# Patient Record
Sex: Male | Born: 1955 | Race: White | Hispanic: No | Marital: Married | Smoking: Never smoker
Health system: Southern US, Community
[De-identification: ages and names within clinical notes are randomized; demographics above are authoritative.]

## PROBLEM LIST (undated history)

## (undated) DIAGNOSIS — C801 Malignant (primary) neoplasm, unspecified: Secondary | ICD-10-CM

## (undated) DIAGNOSIS — F32A Depression, unspecified: Secondary | ICD-10-CM

## (undated) DIAGNOSIS — C61 Malignant neoplasm of prostate: Secondary | ICD-10-CM

## (undated) DIAGNOSIS — K219 Gastro-esophageal reflux disease without esophagitis: Secondary | ICD-10-CM

## (undated) DIAGNOSIS — Z889 Allergy status to unspecified drugs, medicaments and biological substances status: Secondary | ICD-10-CM

## (undated) DIAGNOSIS — F419 Anxiety disorder, unspecified: Secondary | ICD-10-CM

## (undated) DIAGNOSIS — M1990 Unspecified osteoarthritis, unspecified site: Secondary | ICD-10-CM

## (undated) DIAGNOSIS — Z87442 Personal history of urinary calculi: Secondary | ICD-10-CM

## (undated) HISTORY — PX: TONSILLECTOMY: SUR1361

## (undated) HISTORY — PX: COLONOSCOPY: SHX174

## (undated) HISTORY — PX: HERNIA REPAIR: SHX51

## (undated) HISTORY — PX: PROSTATE BIOPSY: SHX241

## (undated) HISTORY — DX: Unspecified osteoarthritis, unspecified site: M19.90

## (undated) HISTORY — PX: VARICOCELE EXCISION: SUR582

## (undated) HISTORY — DX: Malignant neoplasm of prostate: C61

---

## 2004-06-01 ENCOUNTER — Ambulatory Visit: Payer: Self-pay | Admitting: Internal Medicine

## 2004-06-15 ENCOUNTER — Ambulatory Visit: Payer: Self-pay | Admitting: Internal Medicine

## 2013-07-30 ENCOUNTER — Other Ambulatory Visit: Payer: Self-pay | Admitting: Urology

## 2013-09-11 ENCOUNTER — Encounter (HOSPITAL_COMMUNITY): Payer: Self-pay | Admitting: Pharmacy Technician

## 2013-09-12 ENCOUNTER — Encounter (HOSPITAL_COMMUNITY): Payer: Self-pay

## 2013-09-12 ENCOUNTER — Encounter (HOSPITAL_COMMUNITY)
Admission: RE | Admit: 2013-09-12 | Discharge: 2013-09-12 | Disposition: A | Discharge status: Home / Self Care | Payer: BC Managed Care – PPO | Source: Ambulatory Visit | Attending: Urology | Admitting: Urology

## 2013-09-12 DIAGNOSIS — Z01812 Encounter for preprocedural laboratory examination: Secondary | ICD-10-CM | POA: Insufficient documentation

## 2013-09-12 HISTORY — DX: Malignant (primary) neoplasm, unspecified: C80.1

## 2013-09-12 HISTORY — DX: Personal history of urinary calculi: Z87.442

## 2013-09-12 HISTORY — DX: Gastro-esophageal reflux disease without esophagitis: K21.9

## 2013-09-12 HISTORY — DX: Allergy status to unspecified drugs, medicaments and biological substances: Z88.9

## 2013-09-12 LAB — CBC
HCT: 44.3 % (ref 39.0–52.0)
Hemoglobin: 15.6 g/dL (ref 13.0–17.0)
MCH: 31.8 pg (ref 26.0–34.0)
MCHC: 35.2 g/dL (ref 30.0–36.0)
MCV: 90.2 fL (ref 78.0–100.0)
Platelets: 251 10*3/uL (ref 150–400)
RBC: 4.91 MIL/uL (ref 4.22–5.81)
RDW: 12.1 % (ref 11.5–15.5)
WBC: 5.6 10*3/uL (ref 4.0–10.5)

## 2013-09-12 LAB — BASIC METABOLIC PANEL
BUN: 16 mg/dL (ref 6–23)
CO2: 25 mEq/L (ref 19–32)
Calcium: 9.1 mg/dL (ref 8.4–10.5)
Chloride: 103 mEq/L (ref 96–112)
Creatinine, Ser: 1.02 mg/dL (ref 0.50–1.35)
GFR calc Af Amer: >90 mL/min (ref >90)
GFR calc non Af Amer: 79 mL/min — ABNORMAL LOW (ref >90)
Glucose, Bld: 124 mg/dL — ABNORMAL HIGH (ref 70–99)
Potassium: 4.4 mEq/L (ref 3.7–5.3)
Sodium: 140 mEq/L (ref 137–147)

## 2013-09-12 NOTE — Patient Instructions (Addendum)
[place]  09/12/2013   Your procedure is scheduled on:  3-19 -2015  Report to [facility] at     [place]   AM.  Call this number if you have problems the morning of surgery: [phone]  Or Presurgical Testing [phone](Mandie Crabbe) For Living Will and/or Health Care Power Attorney Forms: please provide copy for your medical record,may bring AM of surgery(Forms should be already notarized -we do not provide this service).(09-12-13 Information given to patient as requested).  Remember: Follow any bowel prep instructions per MD office.   Do not eat food:After Midnight.    Take these medicines the morning of surgery with A SIP OF WATER: none   Do not wear jewelry, make-up or nail polish.  Do not wear lotions, powders, or perfumes. You may wear deodorant.  Do not shave 48 hours(2 days) prior to first CHG shower(legs and under arms).(Shaving face and neck okay.)  Do not bring valuables to the hospital.(Hospital is not responsible for lost valuables).  Contacts, dentures or removable bridgework, body piercing, hair pins may not be worn into surgery.  Leave suitcase in the car. After surgery it may be brought to your room.  For patients admitted to the hospital, checkout time is 11:00 AM the day of discharge.(Restricted visitors-Any Persons displaying flu-like symptoms or illness).    Patients discharged the day of surgery will not be allowed to drive home. Must have responsible person with you x 24 hours once discharged.  Name and phone number of your driver: Jawara Latorre -spouse [phone] cell  Special Instructions: CHG(Chlorhedine 4%-"Hibiclens","Betasept","Aplicare") Shower Use Special Wash: see special instructions.(avoid face and genitals)   Please read over the following fact sheets that you were given: Blood Transfusion fact sheet, Incentive Spirometry Instruction.  Remember : Type/Screen "Blue armbands" - may not be removed once applied(would result in being  retested AM of surgery, if removed).  Failure to follow these instructions may result in Cancellation of your surgery.   Patient signature_______________________________________________________

## 2013-09-13 LAB — ABO/RH: ABO/RH(D): O POS

## 2013-09-19 ENCOUNTER — Inpatient Hospital Stay (HOSPITAL_COMMUNITY)
Admission: RE | Admit: 2013-09-19 | Discharge: 2013-09-20 | DRG: 708 | Disposition: A | Discharge status: Home / Self Care | Payer: BC Managed Care – PPO | Source: Ambulatory Visit | Attending: Urology | Admitting: Urology

## 2013-09-19 ENCOUNTER — Encounter (HOSPITAL_COMMUNITY): Payer: BC Managed Care – PPO | Admitting: Anesthesiology

## 2013-09-19 ENCOUNTER — Inpatient Hospital Stay (HOSPITAL_COMMUNITY): Payer: BC Managed Care – PPO | Admitting: Anesthesiology

## 2013-09-19 ENCOUNTER — Encounter (HOSPITAL_COMMUNITY)
Admission: RE | Disposition: A | Discharge status: Home / Self Care | Payer: Self-pay | Source: Ambulatory Visit | Attending: Urology

## 2013-09-19 ENCOUNTER — Encounter (HOSPITAL_COMMUNITY): Payer: Self-pay | Admitting: *Deleted

## 2013-09-19 DIAGNOSIS — Z823 Family history of stroke: Secondary | ICD-10-CM

## 2013-09-19 DIAGNOSIS — C61 Malignant neoplasm of prostate: Principal | ICD-10-CM | POA: Diagnosis present

## 2013-09-19 DIAGNOSIS — Z8042 Family history of malignant neoplasm of prostate: Secondary | ICD-10-CM

## 2013-09-19 DIAGNOSIS — Z79899 Other long term (current) drug therapy: Secondary | ICD-10-CM

## 2013-09-19 HISTORY — PX: LYMPHADENECTOMY: SHX5960

## 2013-09-19 HISTORY — PX: ROBOT ASSISTED LAPAROSCOPIC RADICAL PROSTATECTOMY: SHX5141

## 2013-09-19 LAB — TYPE AND SCREEN
ABO/RH(D): O POS
Antibody Screen: NEGATIVE

## 2013-09-19 LAB — HEMOGLOBIN AND HEMATOCRIT, BLOOD
HCT: 40 % (ref 39.0–52.0)
Hemoglobin: 13.8 g/dL (ref 13.0–17.0)

## 2013-09-19 SURGERY — ROBOTIC ASSISTED LAPAROSCOPIC RADICAL PROSTATECTOMY
Anesthesia: General | Site: Abdomen

## 2013-09-19 MED ORDER — BELLADONNA ALKALOIDS-OPIUM 16.2-60 MG RE SUPP
RECTAL | Status: AC
  Filled 2013-09-19: qty 1

## 2013-09-19 MED ORDER — SODIUM CHLORIDE 0.9 % IV BOLUS (SEPSIS)
1000.0000 mL | Freq: Once | INTRAVENOUS | Status: AC
  Administered 2013-09-19: 1000 mL via INTRAVENOUS

## 2013-09-19 MED ORDER — MIDAZOLAM HCL 5 MG/5ML IJ SOLN
INTRAMUSCULAR | Status: DC | PRN
  Administered 2013-09-19: 2 mg via INTRAVENOUS

## 2013-09-19 MED ORDER — LACTATED RINGERS IV SOLN: INTRAVENOUS | Status: DC

## 2013-09-19 MED ORDER — HEMOSTATIC AGENTS (NO CHARGE) OPTIME
TOPICAL | Status: DC | PRN
  Administered 2013-09-19: 1 via TOPICAL

## 2013-09-19 MED ORDER — GLYCOPYRROLATE 0.2 MG/ML IJ SOLN
INTRAMUSCULAR | Status: AC
  Filled 2013-09-19: qty 3

## 2013-09-19 MED ORDER — PROPOFOL 10 MG/ML IV BOLUS
INTRAVENOUS | Status: AC
  Filled 2013-09-19: qty 20

## 2013-09-19 MED ORDER — PROMETHAZINE HCL 25 MG/ML IJ SOLN: 6.2500 mg | INTRAMUSCULAR | Status: DC | PRN

## 2013-09-19 MED ORDER — BUPIVACAINE-EPINEPHRINE 0.25% -1:200000 IJ SOLN
INTRAMUSCULAR | Status: DC | PRN
  Administered 2013-09-19: 30 mL

## 2013-09-19 MED ORDER — LACTATED RINGERS IV SOLN
INTRAVENOUS | Status: DC | PRN
  Administered 2013-09-19: 09:00:00

## 2013-09-19 MED ORDER — SODIUM CHLORIDE 0.9 % IJ SOLN
INTRAMUSCULAR | Status: AC
  Filled 2013-09-19: qty 10

## 2013-09-19 MED ORDER — STERILE WATER FOR IRRIGATION IR SOLN
Status: DC | PRN
  Administered 2013-09-19: 3000 mL

## 2013-09-19 MED ORDER — CIPROFLOXACIN HCL 500 MG PO TABS: 500.0000 mg | ORAL_TABLET | Freq: Two times a day (BID) | ORAL | Status: DC

## 2013-09-19 MED ORDER — CEFAZOLIN SODIUM-DEXTROSE 2-3 GM-% IV SOLR
INTRAVENOUS | Status: AC
  Filled 2013-09-19: qty 50

## 2013-09-19 MED ORDER — GLYCOPYRROLATE 0.2 MG/ML IJ SOLN
INTRAMUSCULAR | Status: DC | PRN
  Administered 2013-09-19: 0.6 mg via INTRAVENOUS

## 2013-09-19 MED ORDER — NEOSTIGMINE METHYLSULFATE 1 MG/ML IJ SOLN
INTRAMUSCULAR | Status: AC
  Filled 2013-09-19: qty 10

## 2013-09-19 MED ORDER — FENTANYL CITRATE 0.05 MG/ML IJ SOLN
INTRAMUSCULAR | Status: DC | PRN
  Administered 2013-09-19: 25 ug via INTRAVENOUS
  Administered 2013-09-19: 50 ug via INTRAVENOUS
  Administered 2013-09-19 (×7): 25 ug via INTRAVENOUS
  Administered 2013-09-19 (×2): 50 ug via INTRAVENOUS

## 2013-09-19 MED ORDER — EPHEDRINE SULFATE 50 MG/ML IJ SOLN
INTRAMUSCULAR | Status: AC
  Filled 2013-09-19: qty 1

## 2013-09-19 MED ORDER — MIDAZOLAM HCL 2 MG/2ML IJ SOLN
INTRAMUSCULAR | Status: AC
  Filled 2013-09-19: qty 2

## 2013-09-19 MED ORDER — HYDROCODONE-ACETAMINOPHEN 5-325 MG PO TABS
1.0000 | ORAL_TABLET | ORAL | Status: DC | PRN
  Administered 2013-09-20: 2 via ORAL
  Filled 2013-09-19: qty 2

## 2013-09-19 MED ORDER — ONDANSETRON HCL 4 MG/2ML IJ SOLN
4.0000 mg | INTRAMUSCULAR | Status: DC | PRN
Start: 2013-09-19 — End: 2013-09-20
  Administered 2013-09-19: 4 mg via INTRAVENOUS
  Filled 2013-09-19: qty 2

## 2013-09-19 MED ORDER — KETOROLAC TROMETHAMINE 15 MG/ML IJ SOLN
30.0000 mg | Freq: Four times a day (QID) | INTRAMUSCULAR | Status: DC
  Administered 2013-09-19 – 2013-09-20 (×3): 30 mg via INTRAVENOUS
  Filled 2013-09-19 (×2): qty 2

## 2013-09-19 MED ORDER — BUPIVACAINE-EPINEPHRINE 0.25% -1:200000 IJ SOLN
INTRAMUSCULAR | Status: AC
  Filled 2013-09-19: qty 1

## 2013-09-19 MED ORDER — SODIUM CHLORIDE 0.9 % IR SOLN
Status: DC | PRN
  Administered 2013-09-19: 1000 mL

## 2013-09-19 MED ORDER — PHENYLEPHRINE HCL 10 MG/ML IJ SOLN
INTRAMUSCULAR | Status: DC | PRN
  Administered 2013-09-19 (×2): 40 ug via INTRAVENOUS

## 2013-09-19 MED ORDER — ROCURONIUM BROMIDE 100 MG/10ML IV SOLN
INTRAVENOUS | Status: DC | PRN
  Administered 2013-09-19: 10 mg via INTRAVENOUS
  Administered 2013-09-19: 5 mg via INTRAVENOUS
  Administered 2013-09-19: 20 mg via INTRAVENOUS
  Administered 2013-09-19: 50 mg via INTRAVENOUS
  Administered 2013-09-19: 10 mg via INTRAVENOUS

## 2013-09-19 MED ORDER — ACETAMINOPHEN 325 MG PO TABS
650.0000 mg | ORAL_TABLET | ORAL | Status: DC | PRN
  Administered 2013-09-20: 650 mg via ORAL
  Filled 2013-09-19: qty 2

## 2013-09-19 MED ORDER — MORPHINE SULFATE 2 MG/ML IJ SOLN
2.0000 mg | INTRAMUSCULAR | Status: DC | PRN
  Administered 2013-09-19: 2 mg via INTRAVENOUS
  Filled 2013-09-19: qty 1

## 2013-09-19 MED ORDER — NEOSTIGMINE METHYLSULFATE 1 MG/ML IJ SOLN
INTRAMUSCULAR | Status: DC | PRN
  Administered 2013-09-19: 4 mg via INTRAVENOUS

## 2013-09-19 MED ORDER — HYDROMORPHONE HCL PF 1 MG/ML IJ SOLN
INTRAMUSCULAR | Status: AC
  Filled 2013-09-19: qty 1

## 2013-09-19 MED ORDER — KETOROLAC TROMETHAMINE 30 MG/ML IJ SOLN
INTRAMUSCULAR | Status: AC
  Filled 2013-09-19: qty 1

## 2013-09-19 MED ORDER — KETOROLAC TROMETHAMINE 30 MG/ML IJ SOLN
30.0000 mg | Freq: Four times a day (QID) | INTRAMUSCULAR | Status: DC
  Administered 2013-09-19: 30 mg via INTRAVENOUS
  Filled 2013-09-19: qty 2
  Filled 2013-09-19 (×3): qty 1

## 2013-09-19 MED ORDER — HYDROMORPHONE HCL PF 1 MG/ML IJ SOLN
0.2500 mg | INTRAMUSCULAR | Status: DC | PRN
  Administered 2013-09-19 (×4): 0.5 mg via INTRAVENOUS

## 2013-09-19 MED ORDER — CEFAZOLIN SODIUM-DEXTROSE 2-3 GM-% IV SOLR
2.0000 g | INTRAVENOUS | Status: AC
  Administered 2013-09-19: 2 g via INTRAVENOUS

## 2013-09-19 MED ORDER — DEXAMETHASONE SODIUM PHOSPHATE 10 MG/ML IJ SOLN
INTRAMUSCULAR | Status: DC | PRN
  Administered 2013-09-19: 10 mg via INTRAVENOUS

## 2013-09-19 MED ORDER — FENTANYL CITRATE 0.05 MG/ML IJ SOLN
INTRAMUSCULAR | Status: AC
  Filled 2013-09-19: qty 2

## 2013-09-19 MED ORDER — PHENYLEPHRINE 40 MCG/ML (10ML) SYRINGE FOR IV PUSH (FOR BLOOD PRESSURE SUPPORT)
PREFILLED_SYRINGE | INTRAVENOUS | Status: AC
  Filled 2013-09-19: qty 10

## 2013-09-19 MED ORDER — MEPERIDINE HCL 50 MG/ML IJ SOLN: 6.2500 mg | INTRAMUSCULAR | Status: DC | PRN

## 2013-09-19 MED ORDER — LACTATED RINGERS IV SOLN
INTRAVENOUS | Status: DC | PRN
Start: 2013-09-19 — End: 2013-09-19
  Administered 2013-09-19 (×2): via INTRAVENOUS

## 2013-09-19 MED ORDER — FENTANYL CITRATE 0.05 MG/ML IJ SOLN
INTRAMUSCULAR | Status: AC
  Filled 2013-09-19: qty 5

## 2013-09-19 MED ORDER — DEXTROSE-NACL 5-0.45 % IV SOLN
INTRAVENOUS | Status: DC
  Administered 2013-09-19 – 2013-09-20 (×3): via INTRAVENOUS

## 2013-09-19 MED ORDER — BELLADONNA ALKALOIDS-OPIUM 16.2-60 MG RE SUPP
1.0000 | RECTAL | Status: AC
  Administered 2013-09-19: 1 via RECTAL

## 2013-09-19 MED ORDER — ROCURONIUM BROMIDE 100 MG/10ML IV SOLN
INTRAVENOUS | Status: AC
  Filled 2013-09-19: qty 1

## 2013-09-19 MED ORDER — PROPOFOL 10 MG/ML IV BOLUS
INTRAVENOUS | Status: DC | PRN
  Administered 2013-09-19: 200 mg via INTRAVENOUS

## 2013-09-19 MED ORDER — DEXAMETHASONE SODIUM PHOSPHATE 10 MG/ML IJ SOLN
INTRAMUSCULAR | Status: AC
  Filled 2013-09-19: qty 1

## 2013-09-19 MED ORDER — CALCIUM CARBONATE ANTACID 500 MG PO CHEW
200.0000 mg | CHEWABLE_TABLET | Freq: Four times a day (QID) | ORAL | Status: DC | PRN
  Administered 2013-09-19: 200 mg via ORAL
  Filled 2013-09-19 (×2): qty 1

## 2013-09-19 MED ORDER — HEPARIN SODIUM (PORCINE) 1000 UNIT/ML IJ SOLN
INTRAMUSCULAR | Status: AC
  Filled 2013-09-19: qty 1

## 2013-09-19 MED ORDER — ONDANSETRON HCL 4 MG/2ML IJ SOLN
INTRAMUSCULAR | Status: AC
  Filled 2013-09-19: qty 2

## 2013-09-19 MED ORDER — ONDANSETRON HCL 4 MG/2ML IJ SOLN
INTRAMUSCULAR | Status: DC | PRN
  Administered 2013-09-19: 4 mg via INTRAVENOUS

## 2013-09-19 MED ORDER — HYDROCODONE-ACETAMINOPHEN 5-325 MG PO TABS: 1.0000 | ORAL_TABLET | Freq: Four times a day (QID) | ORAL | Status: DC | PRN

## 2013-09-19 SURGICAL SUPPLY — 51 items
APL ESCP 34 STRL LF DISP (HEMOSTASIS) ×2
APPLICATOR SURGIFLO ENDO (HEMOSTASIS) ×1 IMPLANT
CABLE HIGH FREQUENCY MONO STRZ (ELECTRODE) ×3 IMPLANT
CATH FOLEY 2WAY SLVR 18FR 30CC (CATHETERS) ×3 IMPLANT
CATH ROBINSON RED A/P 16FR (CATHETERS) ×3 IMPLANT
CATH TIEMANN FOLEY 18FR 5CC (CATHETERS) ×3 IMPLANT
CHLORAPREP W/TINT 26ML (MISCELLANEOUS) ×3 IMPLANT
CLIP LIGATING HEM O LOK PURPLE (MISCELLANEOUS) ×2 IMPLANT
CLOTH BEACON ORANGE TIMEOUT ST (SAFETY) ×3 IMPLANT
COVER SURGICAL LIGHT HANDLE (MISCELLANEOUS) ×3 IMPLANT
COVER TIP SHEARS 8 DVNC (MISCELLANEOUS) ×2 IMPLANT
COVER TIP SHEARS 8MM DA VINCI (MISCELLANEOUS) ×1
CUTTER ECHEON FLEX ENDO 45 340 (ENDOMECHANICALS) ×3 IMPLANT
DECANTER SPIKE VIAL GLASS SM (MISCELLANEOUS) ×2 IMPLANT
DRAPE SURG IRRIG POUCH 19X23 (DRAPES) ×3 IMPLANT
DRSG TEGADERM 2-3/8X2-3/4 SM (GAUZE/BANDAGES/DRESSINGS) ×12 IMPLANT
DRSG TEGADERM 4X4.75 (GAUZE/BANDAGES/DRESSINGS) ×6 IMPLANT
DRSG TEGADERM 6X8 (GAUZE/BANDAGES/DRESSINGS) ×6 IMPLANT
ELECT REM PT RETURN 9FT ADLT (ELECTROSURGICAL) ×3
ELECTRODE REM PT RTRN 9FT ADLT (ELECTROSURGICAL) ×2 IMPLANT
GAUZE SPONGE 2X2 8PLY STRL LF (GAUZE/BANDAGES/DRESSINGS) ×2 IMPLANT
GLOVE BIO SURGEON STRL SZ 6.5 (GLOVE) ×3 IMPLANT
GLOVE BIOGEL M STRL SZ7.5 (GLOVE) ×6 IMPLANT
GOWN STRL REUS W/ TWL LRG LVL3 (GOWN DISPOSABLE) IMPLANT
GOWN STRL REUS W/TWL LRG LVL3 (GOWN DISPOSABLE) ×9 IMPLANT
GOWN STRL REUS W/TWL XL LVL3 (GOWN DISPOSABLE) ×6 IMPLANT
HEMOSTAT SURGICEL 4X8 (HEMOSTASIS) ×1 IMPLANT
HOLDER FOLEY CATH W/STRAP (MISCELLANEOUS) ×3 IMPLANT
IV LACTATED RINGERS 1000ML (IV SOLUTION) ×2 IMPLANT
KIT ACCESSORY DA VINCI DISP (KITS) ×1
KIT ACCESSORY DVNC DISP (KITS) ×2 IMPLANT
NDL SAFETY ECLIPSE 18X1.5 (NEEDLE) ×2 IMPLANT
NEEDLE HYPO 18GX1.5 SHARP (NEEDLE) ×3
PACK ROBOT UROLOGY CUSTOM (CUSTOM PROCEDURE TRAY) ×3 IMPLANT
RELOAD GREEN ECHELON 45 (STAPLE) ×3 IMPLANT
SEALER TISSUE G2 CVD JAW 45CM (ENDOMECHANICALS) IMPLANT
SET TUBE IRRIG SUCTION NO TIP (IRRIGATION / IRRIGATOR) ×3 IMPLANT
SOLUTION ELECTROLUBE (MISCELLANEOUS) ×3 IMPLANT
SPONGE GAUZE 2X2 STER 10/PKG (GAUZE/BANDAGES/DRESSINGS) ×1
SPONGE GAUZE 4X4 12PLY (GAUZE/BANDAGES/DRESSINGS) ×1 IMPLANT
SURGIFLO W/THROMBIN 8M KIT (HEMOSTASIS) ×1 IMPLANT
SUT ETHILON 3 0 PS 1 (SUTURE) ×3 IMPLANT
SUT VIC AB 2-0 SH 27 (SUTURE)
SUT VIC AB 2-0 SH 27X BRD (SUTURE) ×2 IMPLANT
SUT VICRYL 0 UR6 27IN ABS (SUTURE) ×3 IMPLANT
SUT VLOC BARB 180 ABS3/0GR12 (SUTURE) ×9
SUTURE VLOC BRB 180 ABS3/0GR12 (SUTURE) ×4 IMPLANT
SYR 27GX1/2 1ML LL SAFETY (SYRINGE) ×3 IMPLANT
TOWEL OR 17X26 10 PK STRL BLUE (TOWEL DISPOSABLE) ×3 IMPLANT
TOWEL OR NON WOVEN STRL DISP B (DISPOSABLE) ×3 IMPLANT
WATER STERILE IRR 1500ML POUR (IV SOLUTION) ×4 IMPLANT

## 2013-09-19 NOTE — Transfer of Care (Signed)
Immediate Anesthesia Transfer of Care Note  Patient: Perle Gibbon  Procedure(s) Performed: Procedure(s): ROBOTIC ASSISTED LAPAROSCOPIC RADICAL PROSTATECTOMY (N/A) LYMPHADENECTOMY  PELVIC LYMPH NODE DISSECTION (Bilateral)  Patient Location: PACU  Anesthesia Type:General  Level of Consciousness: awake, alert , oriented and patient cooperative  Airway & Oxygen Therapy: Patient Spontanous Breathing and Patient connected to face mask oxygen  Post-op Assessment: Report given to PACU RN and Post -op Vital signs reviewed and stable  Post vital signs: Reviewed and stable  Complications: No apparent anesthesia complications

## 2013-09-19 NOTE — Anesthesia Preprocedure Evaluation (Signed)
Anesthesia Evaluation  Patient identified by MRN, date of birth, ID band Patient awake    Reviewed: Allergy & Precautions, H&P , NPO status , Patient's Chart, lab work & pertinent test results  Airway Mallampati: II TM Distance: >3 FB Neck ROM: Full    Dental  (+) Dental Advisory Given   Pulmonary neg pulmonary ROS,          Cardiovascular negative cardio ROS  Rhythm:Regular Rate:Normal     Neuro/Psych negative neurological ROS  negative psych ROS   GI/Hepatic Neg liver ROS, GERD-  ,  Endo/Other  negative endocrine ROS  Renal/GU negative Renal ROS     Musculoskeletal negative musculoskeletal ROS (+)   Abdominal   Peds  Hematology negative hematology ROS (+)   Anesthesia Other Findings   Reproductive/Obstetrics                           Anesthesia Physical Anesthesia Plan  ASA: II  Anesthesia Plan: General   Post-op Pain Management:    Induction: Intravenous  Airway Management Planned: Oral ETT  Additional Equipment:   Intra-op Plan:   Post-operative Plan: Extubation in OR  Informed Consent: I have reviewed the patients History and Physical, chart, labs and discussed the procedure including the risks, benefits and alternatives for the proposed anesthesia with the patient or authorized representative who has indicated his/her understanding and acceptance.   Dental advisory given  Plan Discussed with: CRNA  Anesthesia Plan Comments:         Anesthesia Quick Evaluation

## 2013-09-19 NOTE — Anesthesia Postprocedure Evaluation (Signed)
Anesthesia Post Note  Patient: Nathan Berg  Procedure(s) Performed: Procedure(s) (LRB): ROBOTIC ASSISTED LAPAROSCOPIC RADICAL PROSTATECTOMY (N/A) LYMPHADENECTOMY  PELVIC LYMPH NODE DISSECTION (Bilateral)  Anesthesia type: General  Patient location: PACU  Post pain: Pain level controlled  Post assessment: Post-op Vital signs reviewed  Last Vitals: BP 127/83  Pulse 93  Temp(Src) 36.6 C (Oral)  Resp 12  Ht 6' (1.829 m)  Wt 208 lb (94.348 kg)  BMI 28.20 kg/m2  SpO2 95%  Post vital signs: Reviewed  Level of consciousness: sedated  Complications: No apparent anesthesia complications

## 2013-09-19 NOTE — Interval H&P Note (Signed)
History and Physical Interval Note:  09/19/2013 8:12 AM  Nathan Berg  has presented today for surgery, with the diagnosis of PROSTATE CANCER  The various methods of treatment have been discussed with the patient and family. After consideration of risks, benefits and other options for treatment, the patient has consented to  Procedure(Berg): ROBOTIC ASSISTED LAPAROSCOPIC RADICAL PROSTATECTOMY (N/A) LYMPHADENECTOMY  PELVIC LYMPH NODE DISSECTION (Bilateral) as a surgical intervention .  The patient'Berg history has been reviewed, patient examined, no change in status, stable for surgery.  I have reviewed the patient'Berg chart and labs.  Questions were answered to the patient'Berg satisfaction.     Nathan Berg,Nathan Berg

## 2013-09-19 NOTE — Discharge Instructions (Signed)
1. Activity:  You are encouraged to ambulate frequently (about every hour during waking hours) to help prevent blood clots from forming in your legs or lungs.  However, you should not engage in any heavy lifting (> 10-15 lbs), strenuous activity, or straining. 2. Diet: You should continue a clear liquid diet until passing gas from below.  Once this occurs, you may advance your diet to a soft diet that would be easy to digest (i.e soups, scrambled eggs, mashed potatoes, etc.) for 24 hours just as you would if getting over a bad stomach flu.  If tolerating this diet well for 24 hours, you may then begin eating regular food.  It will be normal to have some amount of bloating, nausea, and abdominal discomfort intermittently. 3. Prescriptions:  You will be provided a prescription for pain medication to take as needed.  If your pain is not severe enough to require the prescription pain medication, you may take extra strength Tylenol instead.  You should also take an over the counter stool softener (Colace 100 mg twice daily) to avoid straining with bowel movements as the pain medication may constipate you. Finally, you will also be provided a prescription for an antibiotic to begin the day prior to your return visit in the office for catheter removal. 4. Catheter care: You will be taught how to take care of the catheter by the nursing staff prior to discharge from the hospital.  You may use both a leg bag and the larger bedside bag but it is recommended to at least use the bigger bedside bag at nighttime as the leg bag is small and will fill up overnight and also does not drain as well when lying flat. You may periodically feel a strong urge to void with the catheter in place.  This is a bladder spasm and most often can occur when having a bowel movement or when you are moving around. It is typically self-limited and usually will stop after a few minutes.  You may use some Vaseline or Neosporin around the tip of the  catheter to reduce friction at the tip of the penis. 5. Incisions: You may remove your dressing bandages the 2nd day after surgery.  You most likely will have a few small staples in each of the incisions and once the bandages are removed, the incisions may stay open to air.  You may start showering (not soaking or bathing in water) 48 hours after surgery and the incisions simply need to be patted dry after the shower.  No additional care is needed. 6. What to call us about: You should call the office [phone]) if you develop fever > 101, persistent vomiting, or the catheter stops draining. Also, feel free to call with any other questions you may have and remember the handout that was provided to you as a reference preoperatively which answers many of the common questions that arise after surgery.  You may resume aspirin, aleve, advil, vitamins, and supplements 7 days after surgery.

## 2013-09-19 NOTE — H&P (Signed)
Reason For Visit     Nathan Berg is a 58 year old male who presents today for discussion about the role of robotic prostatectomy to treat his recently diagnosed prostate cancer.  He did have palpable induration of the right lobe in his best classified as stage TIIb. The patient's father was diagnosed with prostate cancer was treated with prostatectomy. The patient discussed these issues with Dr. Karsten Ro. He presents now to discuss the pros and cons of robotic prostatectomy. He is interested in a surgical approach.  IPSS 13/2  SHIM 21/25. He has had no intra-abdominal surgery. He reports bilateral inguinal hernia repair via a incision with mesh.   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 He was found to have some slight prostatic asymmetry with the right lobe larger than the left on exam by Dr. Redmond Pulling. His father was diagnosed with prostate cancer in his mid 17s.  PSAs:  7/11 - 2.53  8/12 - 3.32  8/13 - 4.78  11/14 - 9.13  TRUS/BX 07/17/13: His prostate measured 30 cc.  Pathology: Gleason 4+3 = 7 in 3 cores, 3+4 = 7 in 2 cores and 3+3 = 6 in 1 core for a total of 6/12 cores positive all from the right hand side.     Past Medical History Problems  1. 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 History of athlete's foot (V12.09)  Surgical History Problems  1. History of Colonoscopy (Fiberoptic) 2. History of Hernia Repair 3. History of Surgery Spermatic Cord Excision Of Varicocele 4. History of Surgery Spermatic Cord For Varicocele 5. History of Tonsillectomy  Current Meds 1. Melatonin TABS;  Therapy: (Recorded:22Dec2014) to Recorded 2. Tylenol PM Extra Strength TABS;  Therapy: (Recorded:22Dec2014) to Recorded  Allergies Medication  1. Codeine Derivatives  Family History Problems  1. Family history of Environmental allergies 2. Family history of Alzheimer's disease (V17.2) : Unknown 3. Family history of malignant  neoplasm (V16.9) 4. Family history of migraine headaches (V17.2) 5. Family history of prostate cancer (V16.42) 6. Family history of stroke (V17.1) 7. No pertinent family history : Father  Social History Problems  1. Caffeine use (V49.89) 2. Married 3. Never a smoker (V49.89)  Review of Systems Genitourinary, constitutional, skin, eye, otolaryngeal, hematologic/lymphatic, cardiovascular, pulmonary, endocrine, musculoskeletal, gastrointestinal, neurological and psychiatric system(s) were reviewed and pertinent findings if present are noted.  Genitourinary: weak urinary stream.  Gastrointestinal: heartburn.  Musculoskeletal: back pain and joint pain.    Vitals Vital Signs [Data Includes: Last 1 Day]  Recorded: 26Jan2015 04:23PM  Blood Pressure: 108 / 71 Temperature: 98.4 F Heart Rate: 89  Physical Exam Constitutional: Well nourished and well developed . No acute distress.  ENT:. The ears and nose are normal in appearance.  Neck: The appearance of the neck is normal and no neck mass is present.  Pulmonary: No respiratory distress and normal respiratory rhythm and effort.  Cardiovascular: Heart rate and rhythm are normal . No peripheral edema.  Abdomen: The abdomen is soft and nontender. No masses are palpated. No CVA tenderness. No hernias are palpable. No hepatosplenomegaly noted.  Genitourinary: Examination of the penis demonstrates no discharge, no masses, no lesions and a normal meatus. The scrotum is without lesions. The right epididymis is palpably normal and non-tender. The left epididymis is palpably normal and non-tender. The right testis is non-tender and without masses. The left testis is non-tender and without masses.  Skin: Normal skin turgor, no visible rash and no visible skin lesions.  Neuro/Psych:. Mood and affect are appropriate.    Results/Data Partin table results: His probability of organ confined disease is 31% with a 62% probability of extracapsular extension  and a 15% probability of lymph node involvement.     Assessment Assessed  1. Adenocarcinoma of prostate (185)  Plan Adenocarcinoma of prostate  1. Follow-up Keep Future Appt Office  Follow-up  Status: Complete  Done: [id number] 2. PT/OT Referral Referral  Referral  Status: Hold For - PreCert,Date of Service,Physical  Therapy  Requested for: [id number]  Discussion/Summary  The patient was counseled about the natural history of prostate cancer and the standard treatment options that are available for prostate cancer. It was explained to him how his age and life expectancy, clinical stage, Gleason score, and PSA affect his prognosis, the decision to proceed with additional staging studies, as well as how that information influences recommended treatment strategies. We discussed the roles for active surveillance, radiation therapy, surgical therapy, androgen deprivation, as well as ablative therapy options for the treatment of prostate cancer as appropriate to his individual cancer situation. We discussed the risks and benefits of these options with regard to their impact on cancer control and also in terms of potential adverse events, complications, and impact on quiality of life particularly related to urinary, bowel, and sexual function. The patient was encouraged to ask questions throughout the discussion today and all questions were answered to his stated satisfaction. In addition, the patient was provided with and/or directed to appropriate resources and literature for further education about prostate cancer and treatment options.   We discussed surgical therapy for prostate cancer including the different available surgical approaches. We discussed, in detail, the risks and expectations of surgery with regard to cancer control, urinary control, and erectile function as well as the expected postoperative recovery process. The risks, potential complications/adverse events of radical prostatectomy as  well as alternative options were explained to the patient.   We discussed surgical therapy for prostate cancer including the different available surgical approaches. We discussed, in detail, the risks and expectations of surgery with regard to cancer control, urinary control, and erectile function as well as the expected postoperative recovery process. Additional risks of surgery including but not limited to bleeding, infection, hernia formation, nerve damage, lymphocele formation, bowel/rectal injury potentially necessitating colostomy, damage to the urinary tract resulting in urine leakage, urethral stricture, and the cardiopulmonary risks such as myocardial infarction, stroke, death, venothromboembolism, etc. were explained. The risk of open surgical conversion for robotic/laparoscopic prostatectomy was also discussed.   40 minutes were spent in face to face consultation with patient today.    AmendmentHe would like to proceed with surgery. Recommend left-sided nerve sparing very limited right-sided nerve spare with bilateral pelvic lymph node dissection

## 2013-09-19 NOTE — Op Note (Signed)
Preoperative diagnosis: Clinical stage T1c Adenocarcinoma prostate  Postoperative diagnosis: Same  Procedure: Robotic-assisted laparoscopic radical retropubic prostatectomy with bilateral pelvic lymph node dissection  Surgeon: Bernestine Amass, MD  Asst.: Leta Baptist, PA Anesthesia: Gen. Endotracheal  Indications: Patient was diagnosed with clinical stage TIc Adenocarcinoma the prostate. He underwent extensive consultation with regard to treatment options. The patient decided on a surgical approach. He appeared to understand the distinct advantages as well as the disadvantages of this procedure. The patient has performed a mechanical bowel prep. He has had placement of PAS compression boots and has received perioperative antibiotics. The patient's preoperative PSA was 9. Ultrasound revealed a 30 g prostate.   Technique and findings:The patient was brought to the operating room and had successful induction of general endotracheal anesthesia.the patient was placed in a low lithotomy position with careful padding of all extremities. He was secured to the operative table and placed in the steep Trendelenburg position. He was prepped and draped in usual manner. A Foley catheter was placed sterilely on the field. Camera port site was chosen 18 cm above the pubic symphysis just to the left of the umbilicus. A standard open Hassan technique was utilized. A 12 mm trocar was placed without difficulty. The camera was then inserted and no abnormalities were noted within the pelvis. The trochars were placed with direct visual guidance. This included 3 80mm robotic trochars and a 12 mm and 5 mm assist ports. Once all the ports were placed the robot was docked. The bladder was filled and the space of Retzius was developed with electrocautery dissection as well as blunt dissection. Superficial fat off the endopelvic fascia and bladder neck was removed with electrocautery scissors. The endopelvic fascia was then incised  bilaterally from base to apex. Levator musculature was swept off the apex of the prostate isolating the dorsal venous complex which was then stapled with the ETS stapling device. The anterior bladder neck was identified with the aid of the Foley balloon. This was then transected down to the Foley catheter with electrocautery scissors. The Foley catheter was then retracted anteriorly.  The posterior bladder neck was then transected and the dissection carried down to the adnexal structures. The seminal vesicles and vas deferens on both sides were then individually dissected free and retracted anteriorly. The posterior plane between the rectum and prostate was then established primarily with blunt dissection.  Attention was then turned towards nerve sparing. The patient was felt to be a candidate for left-sided nerve sparing and limited right-sided nerve sparing. Superficial fascia along the anterior lateral aspect of the prostate was incised bilaterally. This tissue was then swept laterally until we were able to establish a groove between the neurovascular tissue and the posterior lateral aspect on the prostate bilaterally. This groove was then extended from the apex back to the base of the prostate. With the prostate retracted anteriorly the vascular pedicles of the prostate were taken with the Enseal device. The Foley catheter was then reinserted and the anterior urethra was transected. The posterior urethra was then transected as were some rectourethralis fibers. The prostate was then removed from the pelvis. The pelvis was then copiously irrigated. Rectal insufflation was performed and there was no evidence of rectal injury.  Attention was then turned towards bilateral pelvic lymph node dissection. The obturator node packets were removed I laterally and the dissection extended towards the bifurcation of the iliac artery. The obturator nerve was identified on both sides and preserved. Hemalock clips were used for  small veins and lymphatic channels. The node packets were sent for permanent analysis.  Attention was then turned towards reconstruction. The bladder neck did not require any reconstruction. The bladder neck and posterior urethra were reapproximated at the 6:00 position utilizing a 3-0 V-lockl suture. The rest of the anastomosis was done with a double-armed 3-0 -lock suture in a 360 degree manner. A new catheter was placed and bladder irrigation revealed no evidence of leakage. A Blake drain was placed through one of the robotic trochars and positioned in the retropubic space above the anastomosis. This was then secured to the skin with a nylon suture. The prostate was placed in the Endopouch retrieval bag. The 12 mm trocar site was closed with a Vicryl suture with the aid of a suture passer. Our other trochars were taken out with direct visual guidance without evidence of any bleeding. The camera port incision was extended slightly to allow for removal of the specimen and then closed with a running Vicryl suture. All port sites were infiltrated with Marcaine and then closed with surgical clips. The patient was then taken to recovery room having had no obvious complications or problems. Sponge and needle counts were correct.

## 2013-09-19 NOTE — Progress Notes (Signed)
Patient ambulated around half of unit and tolerated well.  Encouraged patient to walk again tonight before bed. Patient agreed.  Will continue to monitor.

## 2013-09-19 NOTE — Progress Notes (Signed)
Patient ID: Nathan Berg, male   DOB: 08-Mar-1956, 58 y.o.   MRN: [id number] Post-op note  Subjective: The patient is doing well.  No complaints.  Denies N/V.  Has not ambulated yet.  Objective: Vital signs in last 24 hours: Temp:  [98 F (36.7 C)-98.6 F (37 C)] 98.2 F (36.8 C) (03/18 1315) Pulse Rate:  [65-82] 82 (03/18 1315) Resp:  [10-18] 12 (03/18 1315) BP: (119-140)/(66-88) 140/72 mmHg (03/18 1315) SpO2:  [95 %-100 %] 96 % (03/18 1315) Weight:  [94.348 kg (208 lb)] 94.348 kg (208 lb) (03/18 1315)  Intake/Output from previous day:   Intake/Output this shift: Total I/O In: 3000 [I.V.:2000; IV Piggyback:1000] Out: 310 [Urine:250; Drains:60]  Physical Exam:  General: Alert and oriented. Abdomen: Soft, Nondistended. Incisions: Clean and dry. Urine: dark yellow  Lab Results:  Recent Labs  09/19/13 1221  HGB 13.8  HCT 40.0    Assessment/Plan: POD#0   1) Continue to monitor  2) DVT prophy, clears, IS, amb, pain control     LOS: 0 days   YARBROUGH,Jamire Shabazz G. 09/19/2013, 3:05 PM

## 2013-09-19 NOTE — Preoperative (Signed)
Beta Blockers   Reason not to administer Beta Blockers:Not Applicable 

## 2013-09-20 ENCOUNTER — Encounter (HOSPITAL_COMMUNITY): Payer: Self-pay | Admitting: Urology

## 2013-09-20 LAB — BASIC METABOLIC PANEL
BUN: 11 mg/dL (ref 6–23)
CO2: 24 mEq/L (ref 19–32)
Calcium: 8.3 mg/dL — ABNORMAL LOW (ref 8.4–10.5)
Chloride: 102 mEq/L (ref 96–112)
Creatinine, Ser: 0.92 mg/dL (ref 0.50–1.35)
GFR calc Af Amer: >90 mL/min (ref >90)
GFR calc non Af Amer: >90 mL/min (ref >90)
Glucose, Bld: 112 mg/dL — ABNORMAL HIGH (ref 70–99)
Potassium: 4.7 mEq/L (ref 3.7–5.3)
Sodium: 136 mEq/L — ABNORMAL LOW (ref 137–147)

## 2013-09-20 LAB — HEMOGLOBIN AND HEMATOCRIT, BLOOD
HCT: 38.7 % — ABNORMAL LOW (ref 39.0–52.0)
Hemoglobin: 13 g/dL (ref 13.0–17.0)

## 2013-09-20 LAB — SURGICAL PATHOLOGY

## 2013-09-20 MED ORDER — BISACODYL 10 MG RE SUPP
10.0000 mg | Freq: Once | RECTAL | Status: AC
  Administered 2013-09-20: 10 mg via RECTAL
  Filled 2013-09-20: qty 1

## 2013-09-20 MED ORDER — CALCIUM CARBONATE ANTACID 500 MG PO CHEW: 500.0000 mg | CHEWABLE_TABLET | Freq: Four times a day (QID) | ORAL | Status: DC | PRN

## 2013-09-20 NOTE — Discharge Summary (Signed)
  Date of admission: 09/19/2013  Date of discharge: 09/20/2013  Admission diagnosis: Prostate Cancer  Discharge diagnosis: Prostate Cancer  History and Physical: For full details, please see admission history and physical. Briefly, Nathan Berg is a 58 y.o. gentleman with localized prostate cancer.  After discussing management/treatment options, he elected to proceed with surgical treatment.  Hospital Course: Nathan Berg was taken to the operating room on 09/19/2013 and underwent a robotic assisted laparoscopic radical prostatectomy. He tolerated this procedure well and without complications. Postoperatively, he was able to be transferred to a regular hospital room following recovery from anesthesia.  He was able to begin ambulating the night of surgery. He remained hemodynamically stable overnight.  He had excellent urine output with appropriately minimal output from his pelvic drain and his pelvic drain was removed on POD #1.  He was transitioned to oral pain medication, tolerated a clear liquid diet, and had met all discharge criteria and was able to be discharged home later on POD#1.  Laboratory values:  Recent Labs  09/19/13 1221 09/20/13 0433  HGB 13.8 13.0  HCT 40.0 38.7*    Disposition: Home  Discharge instruction: He was instructed to be ambulatory but to refrain from heavy lifting, strenuous activity, or driving. He was instructed on urethral catheter care.  Discharge medications:     Medication List    STOP taking these medications       diphenhydramine-acetaminophen 25-500 MG Tabs  Commonly known as:  TYLENOL PM      TAKE these medications       ciprofloxacin 500 MG tablet  Commonly known as:  CIPRO  Take 1 tablet (500 mg total) by mouth 2 (two) times daily. Start day prior to office visit for foley removal     HYDROcodone-acetaminophen 5-325 MG per tablet  Commonly known as:  NORCO  Take 1-2 tablets by mouth every 6 (six) hours as needed.     TAGAMET HB PO   Take 1 tablet by mouth 2 (two) times daily as needed (heart burn).        Followup: He will followup in 1 week for catheter removal and to discuss his surgical pathology results.

## 2013-09-20 NOTE — Progress Notes (Signed)
Patient ID: Nathan Berg, male   DOB: 06-28-1956, 58 y.o.   MRN: [id number] 1 Day Post-Op Subjective: The patient is doing well.  No nausea or vomiting. Pain is adequately controlled. No flatus.  Amb well.  Objective: Vital signs in last 24 hours: Temp:  [97.8 F (36.6 C)-98.5 F (36.9 C)] 98.3 F (36.8 C) (03/19 0622) Pulse Rate:  [65-93] 78 (03/19 0622) Resp:  [10-18] 18 (03/19 0622) BP: (113-140)/(50-85) 117/50 mmHg (03/19 0622) SpO2:  [95 %-100 %] 95 % (03/19 0622) Weight:  [94.348 kg (208 lb)] 94.348 kg (208 lb) (03/18 1315)  Intake/Output from previous day: 03/18 0701 - 03/19 0700 In: 4715.8 [P.O.:420; I.V.:3295.8; IV Piggyback:1000] Out: 2795 [Urine:2600; Drains:195] Intake/Output this shift:    Physical Exam:  General: Alert and oriented. CV: RRR Lungs: Clear bilaterally. GI: Soft, Nondistended. Incisions: Clean, dry, and intact Urine: Clear Extremities: Nontender, no erythema, no edema.  Lab Results:  Recent Labs  09/19/13 1221 09/20/13 0433  HGB 13.8 13.0  HCT 40.0 38.7*      Assessment/Plan: POD# 1 s/p robotic prostatectomy.  1) SL IVF 2) Ambulate, Incentive spirometry 3) Transition to oral pain medication 4) Dulcolax suppository 5) D/C pelvic drain 6) Plan for likely discharge later today     LOS: 1 day   YARBROUGH,Eliel Dudding G. 09/20/2013, 7:25 AM

## 2013-09-20 NOTE — Progress Notes (Signed)
Patient ambulated two laps around unit and tolerated well. Will continue to monitor.

## 2014-04-26 ENCOUNTER — Encounter: Payer: Self-pay | Admitting: Radiation Oncology

## 2014-04-26 NOTE — Progress Notes (Signed)
GU Location of Tumor / Histology: prostatic adenocarcinoma  If Prostate Cancer, Gleason Score is (4 + 3) and PSA is (9.13)  Nathan Berg had prostatectomy March of 2015. Now with biochemical recurrence  Biopsies of prostate (if applicable) revealed:    Past/Anticipated interventions by urology, if any: robotic surgery  Past/Anticipated interventions by medical oncology, if any: no  Weight changes, if any: no  Bowel/Bladder complaints, if any:   Nausea/Vomiting, if any: no  Pain issues, if any:  no  SAFETY ISSUES:  Prior radiation? no  Pacemaker/ICD? no  Possible current pregnancy? no  Is the patient on methotrexate? no  Current Complaints / other details: 58 year old male. Married. 30 cc prostate volume

## 2014-04-28 ENCOUNTER — Encounter: Payer: Self-pay | Admitting: Radiation Oncology

## 2014-04-28 NOTE — Progress Notes (Signed)
Radiation Oncology         [phone]) [phone] ________________________________  Initial outpatient Consultation  Name: Nathan Berg  MRN: [id number]  Date: 04/29/2014  DOB: 09-20-1955   CC:No primary provider on file.  Bernestine Amass, MD   REFERRING PHYSICIAN: Bernestine Amass, MD  DIAGNOSIS: 58 y.o. 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 Malignant neoplasm of prostate [place] ILLNESS::Nathan Berg is a 58 y.o. gentleman who was diagnosed with prostate cancer on 07/17/13 when his PSA was elevated at 9.13.  His TRUS with biopsy results are shown in the figure below:    He underwent Robotic-assisted laparoscopic radical retropubic prostatectomy with bilateral pelvic lymph node dissection on 09/19/13, revealing stage T2c disease with gleason 4+3 as shown in the figure below:    Post-operatively, his PSA was 0.03 on 11/13/13, then 0.06 on 02/11/14, and 0.09 on 04/11/14.  The patient reviewed the PSA results with his urologist and he has kindly been referred today for discussion of potential radiation treatment options.  PREVIOUS RADIATION THERAPY: No  PAST MEDICAL HISTORY:  has a past medical history of GERD (gastroesophageal reflux disease); H/O seasonal allergies; History of kidney stones; Cancer; Prostate cancer; and Arthritis.    PAST SURGICAL HISTORY: Past Surgical History  Procedure Laterality Date  . Hernia repair Bilateral     20 yrs ago  . Tonsillectomy      child  . Varicocele excision    . Robot assisted laparoscopic radical prostatectomy N/A 09/19/2013    Procedure: ROBOTIC ASSISTED LAPAROSCOPIC RADICAL PROSTATECTOMY;  Surgeon: Bernestine Amass, MD;  Location: [facility];  Service: Urology;  Laterality: N/A;  . Lymphadenectomy Bilateral 09/19/2013    Procedure: LYMPHADENECTOMY  PELVIC LYMPH NODE DISSECTION;   Surgeon: Bernestine Amass, MD;  Location: [facility];  Service: Urology;  Laterality: Bilateral;  . Colonoscopy    . Prostate biopsy      FAMILY HISTORY: family history includes Cancer in his father.  SOCIAL HISTORY:  reports that he has never smoked. He has never used smokeless tobacco. He reports that he drinks alcohol. He reports that he does not use illicit drugs.  ALLERGIES: Codeine  MEDICATIONS:  Current Outpatient Prescriptions  Medication Sig Dispense Refill  . Cimetidine (TAGAMET HB PO) Take 1 tablet by mouth 2 (two) times daily as needed (heart burn).      . diphenhydramine-acetaminophen (TYLENOL PM) 25-500 MG TABS Take 1 tablet by mouth at bedtime as needed.      Marland Kitchen MELATONIN PO Take by mouth.      . tadalafil (CIALIS) 5 MG tablet Take 5 mg by mouth daily as needed for erectile dysfunction.       No current facility-administered medications for this encounter.    REVIEW OF SYSTEMS:  A 15 point review of systems is documented in the electronic medical record. This was obtained by the nursing staff. However, I reviewed this with the patient to discuss relevant findings and make appropriate changes.  A comprehensive review of systems was negative..  The patient completed an IPSS and IIEF questionnaire.    He indicated that his erectile function is improving with Cialis.   PHYSICAL EXAM: This patient is in no acute distress.  He is alert and oriented.   height is 5\' 11"  (1.803 m) and weight is 210  lb 9.6 oz (95.528 kg). His blood pressure is 142/86 and his pulse is 78. His respiration is 16.  He exhibits no respiratory distress or labored breathing.  He appears neurologically intact.  His mood is pleasant.  His affect is appropriate.  Please note the digital rectal exam findings described above.  KPS = 90  100 - Normal; no complaints; no evidence of disease. 90   - Able to carry on normal activity; minor signs or symptoms of disease. 80   - Normal activity with effort; some signs or  symptoms of disease. 67   - Cares for self; unable to carry on normal activity or to do active work. 60   - Requires occasional assistance, but is able to care for most of his personal needs. 50   - Requires considerable assistance and frequent medical care. 50   - Disabled; requires special care and assistance. 31   - Severely disabled; hospital admission is indicated although death not imminent. 33   - Very sick; hospital admission necessary; active supportive treatment necessary. 10   - Moribund; fatal processes progressing rapidly. 0     - Dead  Karnofsky DA, Abelmann Thayne, Craver LS and Burchenal [facility] [phone]) The use of the nitrogen mustards in the palliative treatment of carcinoma: with particular reference to bronchogenic carcinoma Cancer 1 [phone]   LABORATORY DATA:  Lab Results  Component Value Date   WBC 5.6 09/12/2013   HGB 13.0 09/20/2013   HCT 38.7* 09/20/2013   MCV 90.2 09/12/2013   PLT 251 09/12/2013   Lab Results  Component Value Date   NA 136* 09/20/2013   K 4.7 09/20/2013   CL 102 09/20/2013   CO2 24 09/20/2013   No results found for this basename: ALT,  AST,  GGT,  ALKPHOS,  BILITOT     RADIOGRAPHY: No results found.    IMPRESSION: This is a 58 y.o. 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  He appears to be experiencing biochemical recurrence of prostate cancer.  He does not have any adverse pathology features to suggest higher risk of local recurrence.  He is eligible for a variety of potential options including further PSA monitoring or salvage prostatic fossa radiotherapy with or without hormone therapy.  PLAN:Today I reviewed the findings and workup thus far.  We discussed the natural history of prostate cancer following prostatectomy.  We reviewed the the implications of extraprostatic extension, positive margins, seminal vesicle involvement, lymph node  involvement, and postoperative PSA on decision-making and outcomes.  We discussed radiation treatment in the management of biochemically recurrent prostate cancer with regard to the logistics and delivery of external beam radiation treatment as well as the logistics and delivery of prostate brachytherapy.  The patient expressed interest in external beam radiotherapy.  I filled out a patient counseling form for him with relevant treatment diagrams and we retained a copy for our records.   The patient would like to proceed with prostatic fossa IMRT.  I will share my findings with Dr. Risa Grill and move forward with scheduling CT planning in order to proceed with IMRT in the near future.     The question about adding hormone therapy to radiation treatment has been compounded by the recent presentation of evidence supporting antiandrogen therapy with prostatic fossa radiotherapy for biochemically recurrent prostate cancer following prostatectomy. I talked to the patient today about the pros and  cons of hormone therapy as well as anticipated acute and late sequelae associated with hormone therapy. The patient would actually like to consider combining hormone therapy with radiation treatment given the evidence that it can lead to an improvement in disease-free survival as well as overall survival.  I enjoyed meeting with him today, and will look forward to participating in the care of this very nice gentleman.  I spent 60 minutes face to face with the patient and more than 50% of that time was spent in counseling and/or coordination of care.   ------------------------------------------------  Sheral Apley. Tammi Klippel, M.D.

## 2014-04-29 ENCOUNTER — Ambulatory Visit
Admission: RE | Admit: 2014-04-29 | Discharge: 2014-04-29 | Disposition: A | Discharge status: Home / Self Care | Payer: BC Managed Care – PPO | Source: Ambulatory Visit | Attending: Radiation Oncology | Admitting: Radiation Oncology

## 2014-04-29 ENCOUNTER — Encounter: Payer: Self-pay | Admitting: Radiation Oncology

## 2014-04-29 VITALS — BP 142/86 | HR 78 | Resp 16 | Ht 71.0 in | Wt 210.6 lb

## 2014-04-29 DIAGNOSIS — K219 Gastro-esophageal reflux disease without esophagitis: Secondary | ICD-10-CM | POA: Insufficient documentation

## 2014-04-29 DIAGNOSIS — C61 Malignant neoplasm of prostate: Secondary | ICD-10-CM

## 2014-04-29 DIAGNOSIS — Z9079 Acquired absence of other genital organ(s): Secondary | ICD-10-CM | POA: Diagnosis not present

## 2014-04-29 DIAGNOSIS — Z510 Encounter for antineoplastic radiation therapy: Secondary | ICD-10-CM | POA: Diagnosis present

## 2014-04-29 NOTE — Progress Notes (Signed)
See progress note under physician encounter. 

## 2014-04-30 ENCOUNTER — Ambulatory Visit
Admission: RE | Admit: 2014-04-30 | Discharge: 2014-04-30 | Disposition: A | Discharge status: Home / Self Care | Payer: BC Managed Care – PPO | Source: Ambulatory Visit | Attending: Radiation Oncology | Admitting: Radiation Oncology

## 2014-04-30 DIAGNOSIS — C61 Malignant neoplasm of prostate: Secondary | ICD-10-CM

## 2014-04-30 DIAGNOSIS — Z510 Encounter for antineoplastic radiation therapy: Secondary | ICD-10-CM | POA: Diagnosis not present

## 2014-04-30 NOTE — Progress Notes (Signed)
  Radiation Oncology         [phone] ________________________________  Name: Nathan Berg  MRN: [id number]  Date: 04/30/2014  DOB: [birth year]   SIMULATION AND TREATMENT PLANNING NOTE    ICD-9-CM ICD-10-CM  1. Malignant neoplasm of prostate 185 C61    DIAGNOSIS:  58 y.o. 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  Identity was confirmed.  All relevant records and images related to the planned course of therapy were reviewed.  The patient freely provided informed written consent to proceed with treatment after reviewing the details related to the planned course of therapy. The consent form was witnessed and verified by the simulation staff.  Then, the patient was set-up in a stable reproducible supine position for radiation therapy.  A vacuum lock pillow device was custom fabricated to position his legs in a reproducible immobilized position.  Then, I performed a urethrogram under sterile conditions to identify the prostatic apex.  CT images were obtained.  Surface markings were placed.  The CT images were loaded into the planning software.  Then the prostate target and avoidance structures including the rectum, bladder, bowel and hips were contoured.  Treatment planning then occurred.  The radiation prescription was entered and confirmed.  A total of one complex treatment device was fabricated. I have requested : Intensity Modulated Radiotherapy (IMRT) is medically necessary for this case for the following reason:  Rectal sparing.Marland Kitchen  PLAN:  The patient will receive 68.4 Gy in 38 fractions.  ________________________________  Sheral Apley Tammi Klippel, M.D.

## 2014-05-02 DIAGNOSIS — Z510 Encounter for antineoplastic radiation therapy: Secondary | ICD-10-CM | POA: Diagnosis not present

## 2014-05-06 ENCOUNTER — Ambulatory Visit
Admission: RE | Admit: 2014-05-06 | Discharge: 2014-05-06 | Disposition: A | Discharge status: Home / Self Care | Payer: BC Managed Care – PPO | Source: Ambulatory Visit | Attending: Radiation Oncology | Admitting: Radiation Oncology

## 2014-05-06 DIAGNOSIS — Z510 Encounter for antineoplastic radiation therapy: Secondary | ICD-10-CM | POA: Diagnosis not present

## 2014-05-07 ENCOUNTER — Ambulatory Visit: Admission: RE | Admit: 2014-05-07 | Payer: BC Managed Care – PPO | Source: Ambulatory Visit

## 2014-05-08 ENCOUNTER — Ambulatory Visit
Admission: RE | Admit: 2014-05-08 | Discharge: 2014-05-08 | Disposition: A | Discharge status: Home / Self Care | Payer: BC Managed Care – PPO | Source: Ambulatory Visit | Attending: Radiation Oncology | Admitting: Radiation Oncology

## 2014-05-08 DIAGNOSIS — Z510 Encounter for antineoplastic radiation therapy: Secondary | ICD-10-CM | POA: Diagnosis not present

## 2014-05-09 ENCOUNTER — Ambulatory Visit
Admission: RE | Admit: 2014-05-09 | Discharge: 2014-05-09 | Disposition: A | Discharge status: Home / Self Care | Payer: BC Managed Care – PPO | Source: Ambulatory Visit | Attending: Radiation Oncology | Admitting: Radiation Oncology

## 2014-05-09 ENCOUNTER — Encounter: Payer: Self-pay | Admitting: Radiation Oncology

## 2014-05-09 VITALS — BP 121/75 | HR 97 | Temp 99.0°F | Resp 20 | Wt 212.8 lb

## 2014-05-09 DIAGNOSIS — C61 Malignant neoplasm of prostate: Secondary | ICD-10-CM

## 2014-05-09 DIAGNOSIS — Z510 Encounter for antineoplastic radiation therapy: Secondary | ICD-10-CM | POA: Diagnosis not present

## 2014-05-09 NOTE — Progress Notes (Signed)
  Radiation Oncology         [phone] ________________________________    Name: Nathan Berg  MRN: [id number]  Date: 05/09/2014  DOB: January 12, 1956  Weekly Radiation Therapy Management    ICD-9-CM ICD-10-CM   1. Malignant neoplasm of prostate 185 C61     Current Dose: 5.4 Gy     Planned Dose:  68.4 Gy  Narrative . . . . . . . . The patient presents for routine under treatment assessment.                                   The patient is without complaint.                                 Set-up films were reviewed.                                 The chart was checked. Physical Findings. . .  weight is 212 lb 12.8 oz (96.525 kg). His oral temperature is 99 F (37.2 C). His blood pressure is 121/75 and his pulse is 97. His respiration is 20. . Weight essentially stable.  No significant changes. Impression . . . . . . . The patient is tolerating radiation. Plan . . . . . . . . . . . . Continue treatment as planned.  ________________________________  Sheral Apley. Tammi Klippel, M.D.

## 2014-05-09 NOTE — Progress Notes (Signed)
Patient education completed with patient. Gave him "Radiation and You" booklet with all pertinent information marked and discussed, re: fatigue, rectal irritation/care, urinary/bladder irritation/management, nutrition, pain. Teach back used. Pt verbalized understanding.   Patient denies urinary/bowel issues, pain, fatigue.

## 2014-05-10 ENCOUNTER — Ambulatory Visit
Admission: RE | Admit: 2014-05-10 | Discharge: 2014-05-10 | Disposition: A | Discharge status: Home / Self Care | Payer: BC Managed Care – PPO | Source: Ambulatory Visit | Attending: Radiation Oncology | Admitting: Radiation Oncology

## 2014-05-10 DIAGNOSIS — Z510 Encounter for antineoplastic radiation therapy: Secondary | ICD-10-CM | POA: Diagnosis not present

## 2014-05-13 ENCOUNTER — Ambulatory Visit
Admission: RE | Admit: 2014-05-13 | Discharge: 2014-05-13 | Disposition: A | Discharge status: Home / Self Care | Payer: BC Managed Care – PPO | Source: Ambulatory Visit | Attending: Radiation Oncology | Admitting: Radiation Oncology

## 2014-05-13 DIAGNOSIS — Z510 Encounter for antineoplastic radiation therapy: Secondary | ICD-10-CM | POA: Diagnosis not present

## 2014-05-14 ENCOUNTER — Ambulatory Visit
Admission: RE | Admit: 2014-05-14 | Discharge: 2014-05-14 | Disposition: A | Discharge status: Home / Self Care | Payer: BC Managed Care – PPO | Source: Ambulatory Visit | Attending: Radiation Oncology | Admitting: Radiation Oncology

## 2014-05-14 DIAGNOSIS — Z510 Encounter for antineoplastic radiation therapy: Secondary | ICD-10-CM | POA: Diagnosis not present

## 2014-05-15 ENCOUNTER — Ambulatory Visit
Admission: RE | Admit: 2014-05-15 | Discharge: 2014-05-15 | Disposition: A | Discharge status: Home / Self Care | Payer: BC Managed Care – PPO | Source: Ambulatory Visit | Attending: Radiation Oncology | Admitting: Radiation Oncology

## 2014-05-15 DIAGNOSIS — Z510 Encounter for antineoplastic radiation therapy: Secondary | ICD-10-CM | POA: Diagnosis not present

## 2014-05-16 ENCOUNTER — Ambulatory Visit
Admission: RE | Admit: 2014-05-16 | Discharge: 2014-05-16 | Disposition: A | Discharge status: Home / Self Care | Payer: BC Managed Care – PPO | Source: Ambulatory Visit | Attending: Radiation Oncology | Admitting: Radiation Oncology

## 2014-05-16 DIAGNOSIS — Z510 Encounter for antineoplastic radiation therapy: Secondary | ICD-10-CM | POA: Diagnosis not present

## 2014-05-17 ENCOUNTER — Ambulatory Visit
Admission: RE | Admit: 2014-05-17 | Discharge: 2014-05-17 | Disposition: A | Discharge status: Home / Self Care | Payer: BC Managed Care – PPO | Source: Ambulatory Visit | Attending: Radiation Oncology | Admitting: Radiation Oncology

## 2014-05-17 ENCOUNTER — Encounter: Payer: Self-pay | Admitting: Radiation Oncology

## 2014-05-17 VITALS — BP 140/77 | HR 84 | Resp 16 | Wt 211.9 lb

## 2014-05-17 DIAGNOSIS — Z510 Encounter for antineoplastic radiation therapy: Secondary | ICD-10-CM | POA: Diagnosis not present

## 2014-05-17 DIAGNOSIS — C61 Malignant neoplasm of prostate: Secondary | ICD-10-CM

## 2014-05-17 NOTE — Progress Notes (Signed)
Reports on average he gets up three times during the night to void. Weight and vitals stable. Describes what sounds like two hot flashes this week. Describes a strong steady urine stream when voiding. Denies difficulty emptying his bladder or urgency. Denies hematuria or dysuria. Denies fatigue or pain.

## 2014-05-18 ENCOUNTER — Encounter: Payer: Self-pay | Admitting: Radiation Oncology

## 2014-05-18 NOTE — Progress Notes (Signed)
  Radiation Oncology         [phone]) [phone] ________________________________    Name: Nathan Berg  MRN: [id number]  Date: 05/17/2014  DOB: 11-Apr-1956  Weekly Radiation Therapy Management    ICD-9-CM ICD-10-CM   1. Malignant neoplasm of prostate 185 C61     Current Dose: 16.2 Gy     Planned Dose:  68.4 Gy  Narrative . . . . . . . . The patient presents for routine under treatment assessment.                                   Reports on average he gets up three times during the night to void. Weight and vitals stable. Describes what sounds like two hot flashes this week. Describes a strong steady urine stream when voiding. Denies difficulty emptying his bladder or urgency. Denies hematuria or dysuria. Denies fatigue or pain.                                  Set-up films were reviewed.                                 The chart was checked. Physical Findings. . .  weight is 211 lb 14.4 oz (96.117 kg). His blood pressure is 140/77 and his pulse is 84. His respiration is 16. . Weight essentially stable.  No significant changes. Impression . . . . . . . The patient is tolerating radiation. Plan . . . . . . . . . . . . Continue treatment as planned.  ________________________________  Sheral Apley. Tammi Klippel, M.D.

## 2014-05-20 ENCOUNTER — Ambulatory Visit
Admission: RE | Admit: 2014-05-20 | Discharge: 2014-05-20 | Disposition: A | Discharge status: Home / Self Care | Payer: BC Managed Care – PPO | Source: Ambulatory Visit | Attending: Radiation Oncology | Admitting: Radiation Oncology

## 2014-05-20 DIAGNOSIS — Z510 Encounter for antineoplastic radiation therapy: Secondary | ICD-10-CM | POA: Diagnosis not present

## 2014-05-21 ENCOUNTER — Ambulatory Visit
Admission: RE | Admit: 2014-05-21 | Discharge: 2014-05-21 | Disposition: A | Discharge status: Home / Self Care | Payer: BC Managed Care – PPO | Source: Ambulatory Visit | Attending: Radiation Oncology | Admitting: Radiation Oncology

## 2014-05-21 DIAGNOSIS — Z510 Encounter for antineoplastic radiation therapy: Secondary | ICD-10-CM | POA: Diagnosis not present

## 2014-05-22 ENCOUNTER — Ambulatory Visit
Admission: RE | Admit: 2014-05-22 | Discharge: 2014-05-22 | Disposition: A | Discharge status: Home / Self Care | Payer: BC Managed Care – PPO | Source: Ambulatory Visit | Attending: Radiation Oncology | Admitting: Radiation Oncology

## 2014-05-22 DIAGNOSIS — Z510 Encounter for antineoplastic radiation therapy: Secondary | ICD-10-CM | POA: Diagnosis not present

## 2014-05-23 ENCOUNTER — Ambulatory Visit
Admission: RE | Admit: 2014-05-23 | Discharge: 2014-05-23 | Disposition: A | Discharge status: Home / Self Care | Payer: BC Managed Care – PPO | Source: Ambulatory Visit | Attending: Radiation Oncology | Admitting: Radiation Oncology

## 2014-05-23 DIAGNOSIS — Z510 Encounter for antineoplastic radiation therapy: Secondary | ICD-10-CM | POA: Diagnosis not present

## 2014-05-24 ENCOUNTER — Ambulatory Visit
Admission: RE | Admit: 2014-05-24 | Discharge: 2014-05-24 | Disposition: A | Discharge status: Home / Self Care | Payer: BC Managed Care – PPO | Source: Ambulatory Visit | Attending: Radiation Oncology | Admitting: Radiation Oncology

## 2014-05-24 ENCOUNTER — Encounter: Payer: Self-pay | Admitting: Radiation Oncology

## 2014-05-24 VITALS — BP 130/84 | HR 67 | Resp 16 | Wt 211.0 lb

## 2014-05-24 DIAGNOSIS — C61 Malignant neoplasm of prostate: Secondary | ICD-10-CM

## 2014-05-24 DIAGNOSIS — Z510 Encounter for antineoplastic radiation therapy: Secondary | ICD-10-CM | POA: Diagnosis not present

## 2014-05-24 NOTE — Progress Notes (Signed)
  Radiation Oncology         [phone] ________________________________    Name: Nathan Berg  MRN: [id number]  Date: 05/24/2014  DOB: 05-12-1956  Weekly Radiation Therapy Management    ICD-9-CM ICD-10-CM   1. Malignant neoplasm of prostate 185 C61     Current Dose: 25.2 Gy     Planned Dose:  68.4 Gy  Narrative . . . . . . . . The patient presents for routine under treatment assessment.                                   Reports nocturia x 5. Denies frequency of urination during the day. Denies dysuria. Denies hematuria. Denies diarrhea. Reports strong steady urine stream. Denies leakage or incontinence. Reports an occasional urgency. Denies fatigue. Denies pain. Weight and vitals stable.                                  Set-up films were reviewed.                                 The chart was checked. Physical Findings. . .  weight is 211 lb (95.709 kg). His blood pressure is 130/84 and his pulse is 67. His respiration is 16. . Weight essentially stable.  No significant changes. Impression . . . . . . . The patient is tolerating radiation. Plan . . . . . . . . . . . . Continue treatment as planned.  ________________________________  Sheral Apley. Tammi Klippel, M.D.

## 2014-05-24 NOTE — Progress Notes (Signed)
Reports nocturia x 5. Denies frequency of urination during the day. Denies dysuria. Denies hematuria. Denies diarrhea. Reports strong steady urine stream. Denies leakage or incontinence. Reports an occasional urgency. Denies fatigue. Denies pain. Weight and vitals stable.

## 2014-05-26 ENCOUNTER — Ambulatory Visit
Admission: RE | Admit: 2014-05-26 | Discharge: 2014-05-26 | Disposition: A | Discharge status: Home / Self Care | Payer: BC Managed Care – PPO | Source: Ambulatory Visit | Attending: Radiation Oncology | Admitting: Radiation Oncology

## 2014-05-26 DIAGNOSIS — Z510 Encounter for antineoplastic radiation therapy: Secondary | ICD-10-CM | POA: Diagnosis not present

## 2014-05-27 ENCOUNTER — Ambulatory Visit
Admission: RE | Admit: 2014-05-27 | Discharge: 2014-05-27 | Disposition: A | Discharge status: Home / Self Care | Payer: BC Managed Care – PPO | Source: Ambulatory Visit | Attending: Radiation Oncology | Admitting: Radiation Oncology

## 2014-05-27 DIAGNOSIS — Z510 Encounter for antineoplastic radiation therapy: Secondary | ICD-10-CM | POA: Diagnosis not present

## 2014-05-28 ENCOUNTER — Ambulatory Visit
Admission: RE | Admit: 2014-05-28 | Discharge: 2014-05-28 | Disposition: A | Discharge status: Home / Self Care | Payer: BC Managed Care – PPO | Source: Ambulatory Visit | Attending: Radiation Oncology | Admitting: Radiation Oncology

## 2014-05-28 DIAGNOSIS — Z510 Encounter for antineoplastic radiation therapy: Secondary | ICD-10-CM | POA: Diagnosis not present

## 2014-05-28 NOTE — Progress Notes (Signed)
  Radiation Oncology         [phone] ________________________________    Name: Nathan Berg  MRN: [id number]  Date: 05/29/2014  DOB: 03-07-1956  Weekly Radiation Therapy Management    ICD-9-CM ICD-10-CM   1. Malignant neoplasm of prostate 185 C61     Current Dose: 34.2 Gy     Planned Dose:  68.4 Gy  Narrative . . . . . . . . The patient presents for routine under treatment assessment.                                   Reports scant rectal bleeding yesterday. Understands to use baby wipe and tucks for relief of rectal irritation. Denies diarrhea. Reports difficulty sleeping despite taking melatonin and tylenol pm. Reports nocturia every 1.5. Reports a strong steady urine stream. Denies incontinence or leakage. Denies dysuria or hematuria                                  Set-up films were reviewed.                                 The chart was checked. Physical Findings. . .  weight is 212 lb 3.2 oz (96.253 kg). His blood pressure is 138/96 and his pulse is 74. His respiration is 16. . Weight essentially stable.  No significant changes. Impression . . . . . . . The patient is tolerating radiation. Plan . . . . . . . . . . . . Continue treatment as planned.  ________________________________  Sheral Apley. Tammi Klippel, M.D.

## 2014-05-29 ENCOUNTER — Ambulatory Visit
Admission: RE | Admit: 2014-05-29 | Discharge: 2014-05-29 | Disposition: A | Discharge status: Home / Self Care | Payer: BC Managed Care – PPO | Source: Ambulatory Visit | Attending: Radiation Oncology | Admitting: Radiation Oncology

## 2014-05-29 ENCOUNTER — Encounter: Payer: Self-pay | Admitting: Radiation Oncology

## 2014-05-29 VITALS — BP 138/96 | HR 74 | Resp 16 | Wt 212.2 lb

## 2014-05-29 DIAGNOSIS — C61 Malignant neoplasm of prostate: Secondary | ICD-10-CM

## 2014-05-29 DIAGNOSIS — Z510 Encounter for antineoplastic radiation therapy: Secondary | ICD-10-CM | POA: Diagnosis not present

## 2014-05-29 NOTE — Progress Notes (Signed)
Reports scant rectal bleeding yesterday. Understands to use baby wipe and tucks for relief of rectal irritation. Denies diarrhea. Reports difficulty sleeping despite taking melatonin and tylenol pm. Reports nocturia every 1.5. Reports a strong steady urine stream. Denies incontinence or leakage. Denies dysuria or hematuria.

## 2014-05-31 ENCOUNTER — Ambulatory Visit: Payer: BC Managed Care – PPO

## 2014-06-03 ENCOUNTER — Ambulatory Visit
Admission: RE | Admit: 2014-06-03 | Discharge: 2014-06-03 | Disposition: A | Discharge status: Home / Self Care | Payer: BC Managed Care – PPO | Source: Ambulatory Visit | Attending: Radiation Oncology | Admitting: Radiation Oncology

## 2014-06-03 DIAGNOSIS — Z510 Encounter for antineoplastic radiation therapy: Secondary | ICD-10-CM | POA: Diagnosis not present

## 2014-06-04 ENCOUNTER — Ambulatory Visit
Admission: RE | Admit: 2014-06-04 | Discharge: 2014-06-04 | Disposition: A | Discharge status: Home / Self Care | Payer: BC Managed Care – PPO | Source: Ambulatory Visit | Attending: Radiation Oncology | Admitting: Radiation Oncology

## 2014-06-04 DIAGNOSIS — Z510 Encounter for antineoplastic radiation therapy: Secondary | ICD-10-CM | POA: Diagnosis not present

## 2014-06-05 ENCOUNTER — Ambulatory Visit
Admission: RE | Admit: 2014-06-05 | Discharge: 2014-06-05 | Disposition: A | Discharge status: Home / Self Care | Payer: BC Managed Care – PPO | Source: Ambulatory Visit | Attending: Radiation Oncology | Admitting: Radiation Oncology

## 2014-06-05 DIAGNOSIS — Z510 Encounter for antineoplastic radiation therapy: Secondary | ICD-10-CM | POA: Diagnosis not present

## 2014-06-06 ENCOUNTER — Ambulatory Visit
Admission: RE | Admit: 2014-06-06 | Discharge: 2014-06-06 | Disposition: A | Discharge status: Home / Self Care | Payer: BC Managed Care – PPO | Source: Ambulatory Visit | Attending: Radiation Oncology | Admitting: Radiation Oncology

## 2014-06-06 DIAGNOSIS — Z510 Encounter for antineoplastic radiation therapy: Secondary | ICD-10-CM | POA: Diagnosis not present

## 2014-06-07 ENCOUNTER — Ambulatory Visit: Payer: BC Managed Care – PPO

## 2014-06-10 ENCOUNTER — Ambulatory Visit
Admission: RE | Admit: 2014-06-10 | Discharge: 2014-06-10 | Disposition: A | Discharge status: Home / Self Care | Payer: BC Managed Care – PPO | Source: Ambulatory Visit | Attending: Radiation Oncology | Admitting: Radiation Oncology

## 2014-06-10 ENCOUNTER — Encounter: Payer: Self-pay | Admitting: Radiation Oncology

## 2014-06-10 VITALS — BP 134/84 | HR 80 | Resp 16 | Wt 210.8 lb

## 2014-06-10 DIAGNOSIS — Z510 Encounter for antineoplastic radiation therapy: Secondary | ICD-10-CM | POA: Diagnosis not present

## 2014-06-10 DIAGNOSIS — C61 Malignant neoplasm of prostate: Secondary | ICD-10-CM

## 2014-06-10 NOTE — Progress Notes (Signed)
Weight and vitals stable. Denies pain. Denies dysuria or hematuria. Denies diarrhea. Describes urine stream as strong and steady. Reports nocturia every hour to hour and a half. Reports mild fatigue. Reports hot flashes continue.

## 2014-06-10 NOTE — Progress Notes (Signed)
  Radiation Oncology         [phone] ________________________________  Name: Nathan Berg MRN: [id number]  Date: 06/10/2014  DOB: 1955/12/04  Weekly Radiation Therapy Management    ICD-9-CM ICD-10-CM   1. Malignant neoplasm of prostate 185 C61     Current Dose: 45 Gy     Planned Dose:  68.4 Gy  Narrative . . . . . . . . The patient presents for routine under treatment assessment.                                   The patient is without complaint.  Weight and vitals stable. Denies pain. Denies dysuria or hematuria. Denies diarrhea. Describes urine stream as strong and steady. Reports nocturia every hour to hour and a half. Reports mild fatigue. Reports hot flashes continue.                                 Set-up films were reviewed.                                 The chart was checked. Physical Findings. . .  weight is 210 lb 12.8 oz (95.618 kg). His blood pressure is 134/84 and his pulse is 80. His respiration is 16. . Weight essentially stable.  No significant changes. Impression . . . . . . . The patient is tolerating radiation. Plan . . . . . . . . . . . . Continue treatment as planned.  ________________________________  Sheral Apley. Tammi Klippel, M.D.

## 2014-06-11 ENCOUNTER — Ambulatory Visit
Admission: RE | Admit: 2014-06-11 | Discharge: 2014-06-11 | Disposition: A | Discharge status: Home / Self Care | Payer: BC Managed Care – PPO | Source: Ambulatory Visit | Attending: Radiation Oncology | Admitting: Radiation Oncology

## 2014-06-11 DIAGNOSIS — Z510 Encounter for antineoplastic radiation therapy: Secondary | ICD-10-CM | POA: Diagnosis not present

## 2014-06-12 ENCOUNTER — Ambulatory Visit
Admission: RE | Admit: 2014-06-12 | Discharge: 2014-06-12 | Disposition: A | Discharge status: Home / Self Care | Payer: BC Managed Care – PPO | Source: Ambulatory Visit | Attending: Radiation Oncology | Admitting: Radiation Oncology

## 2014-06-12 DIAGNOSIS — Z510 Encounter for antineoplastic radiation therapy: Secondary | ICD-10-CM | POA: Diagnosis not present

## 2014-06-13 ENCOUNTER — Ambulatory Visit
Admission: RE | Admit: 2014-06-13 | Discharge: 2014-06-13 | Disposition: A | Discharge status: Home / Self Care | Payer: BC Managed Care – PPO | Source: Ambulatory Visit | Attending: Radiation Oncology | Admitting: Radiation Oncology

## 2014-06-13 DIAGNOSIS — Z510 Encounter for antineoplastic radiation therapy: Secondary | ICD-10-CM | POA: Diagnosis not present

## 2014-06-14 ENCOUNTER — Ambulatory Visit
Admission: RE | Admit: 2014-06-14 | Discharge: 2014-06-14 | Disposition: A | Discharge status: Home / Self Care | Payer: BC Managed Care – PPO | Source: Ambulatory Visit | Attending: Radiation Oncology | Admitting: Radiation Oncology

## 2014-06-14 ENCOUNTER — Encounter: Payer: Self-pay | Admitting: Radiation Oncology

## 2014-06-14 VITALS — BP 108/77 | HR 81 | Temp 99.2°F | Resp 18 | Wt 211.4 lb

## 2014-06-14 DIAGNOSIS — Z510 Encounter for antineoplastic radiation therapy: Secondary | ICD-10-CM | POA: Diagnosis not present

## 2014-06-14 DIAGNOSIS — C61 Malignant neoplasm of prostate: Secondary | ICD-10-CM

## 2014-06-14 NOTE — Progress Notes (Signed)
  Radiation Oncology         [phone] ________________________________  Name: Nathan Berg  MRN: [id number]  Date: 06/14/2014  DOB: 04-09-1956  Weekly Radiation Therapy Management    ICD-9-CM ICD-10-CM   1. Malignant neoplasm of prostate 185 C61     Current Dose: 52.2 Gy     Planned Dose:  68.4 Gy  Narrative . . . . . . . . The patient presents for routine under treatment assessment.                                   Weight and vitals stable. Patient denies pain. Reports nocturia every 1.5 hours during the night. Denies urgency, incontinence or leakage. Denies diarrhea. Reports mild fatigue. Denies dysuria or hematuria. New onset of dry cough, scratchy throat, and nasal congestion.                                 Set-up films were reviewed.                                 The chart was checked. Physical Findings. . .  weight is 211 lb 6.4 oz (95.89 kg). His oral temperature is 99.2 F (37.3 C). His blood pressure is 108/77 and his pulse is 81. His respiration is 18 and oxygen saturation is 100%. . Weight essentially stable.  No significant changes. Impression . . . . . . . The patient is tolerating radiation. Plan . . . . . . . . . . . . Continue treatment as planned.  ________________________________  Sheral Apley. Tammi Klippel, M.D.

## 2014-06-14 NOTE — Progress Notes (Signed)
Weight and vitals stable. Patient denies pain. Reports nocturia every 1.5 hours during the night. Denies urgency, incontinence or leakage. Denies diarrhea. Reports mild fatigue. Denies dysuria or hematuria. New onset of dry cough, scratchy throat, and nasal congestion.

## 2014-06-17 ENCOUNTER — Encounter: Payer: Self-pay | Admitting: Internal Medicine

## 2014-06-17 ENCOUNTER — Ambulatory Visit
Admission: RE | Admit: 2014-06-17 | Discharge: 2014-06-17 | Disposition: A | Discharge status: Home / Self Care | Payer: BC Managed Care – PPO | Source: Ambulatory Visit | Attending: Radiation Oncology | Admitting: Radiation Oncology

## 2014-06-17 DIAGNOSIS — Z510 Encounter for antineoplastic radiation therapy: Secondary | ICD-10-CM | POA: Diagnosis not present

## 2014-06-18 ENCOUNTER — Ambulatory Visit
Admission: RE | Admit: 2014-06-18 | Discharge: 2014-06-18 | Disposition: A | Discharge status: Home / Self Care | Payer: BC Managed Care – PPO | Source: Ambulatory Visit | Attending: Radiation Oncology | Admitting: Radiation Oncology

## 2014-06-18 DIAGNOSIS — Z510 Encounter for antineoplastic radiation therapy: Secondary | ICD-10-CM | POA: Diagnosis not present

## 2014-06-19 ENCOUNTER — Ambulatory Visit
Admission: RE | Admit: 2014-06-19 | Discharge: 2014-06-19 | Disposition: A | Discharge status: Home / Self Care | Payer: BC Managed Care – PPO | Source: Ambulatory Visit | Attending: Radiation Oncology | Admitting: Radiation Oncology

## 2014-06-19 DIAGNOSIS — Z510 Encounter for antineoplastic radiation therapy: Secondary | ICD-10-CM | POA: Diagnosis not present

## 2014-06-20 ENCOUNTER — Ambulatory Visit
Admission: RE | Admit: 2014-06-20 | Discharge: 2014-06-20 | Disposition: A | Discharge status: Home / Self Care | Payer: BC Managed Care – PPO | Source: Ambulatory Visit | Attending: Radiation Oncology | Admitting: Radiation Oncology

## 2014-06-20 ENCOUNTER — Encounter: Payer: Self-pay | Admitting: Radiation Oncology

## 2014-06-20 VITALS — BP 135/82 | HR 78 | Resp 16 | Wt 211.3 lb

## 2014-06-20 DIAGNOSIS — Z510 Encounter for antineoplastic radiation therapy: Secondary | ICD-10-CM | POA: Diagnosis not present

## 2014-06-20 DIAGNOSIS — C61 Malignant neoplasm of prostate: Secondary | ICD-10-CM

## 2014-06-20 NOTE — Progress Notes (Signed)
  Radiation Oncology         [phone] ________________________________  Name: Nathan Berg  MRN: [id number]  Date: 06/20/2014  DOB: [birth year]  Weekly Radiation Therapy Management    ICD-9-CM ICD-10-CM   1. Malignant neoplasm of prostate 185 C61     Current Dose: 59.4 Gy     Planned Dose:  68.4 Gy  Narrative . . . . . . . . The patient presents for routine under treatment assessment.                                   Weight and vitals stable. Denies pain. Reports nocturia every 1.5 hours during the night. Denies urgency, incontinence, leakage, dysuria or hematuria. Reports pain associated with bowel movements. Denies blood in stool. Reports mild fatigue. .                                 Set-up films were reviewed.                                 The chart was checked. Physical Findings. . .  weight is 211 lb 4.8 oz (95.845 kg). His blood pressure is 135/82 and his pulse is 78. His respiration is 16. . Weight essentially stable.  No significant changes. Impression . . . . . . . The patient is tolerating radiation. Plan . . . . . . . . . . . . Continue treatment as planned.  ________________________________  Sheral Apley. Tammi Klippel, M.D.

## 2014-06-20 NOTE — Progress Notes (Signed)
Weight and vitals stable. Denies pain. Reports nocturia every 1.5 hours during the night. Denies urgency, incontinence, leakage, dysuria or hematuria. Reports pain associated with bowel movements. Denies blood in stool. Reports mild fatigue.

## 2014-06-21 ENCOUNTER — Ambulatory Visit
Admission: RE | Admit: 2014-06-21 | Discharge: 2014-06-21 | Disposition: A | Discharge status: Home / Self Care | Payer: BC Managed Care – PPO | Source: Ambulatory Visit | Attending: Radiation Oncology | Admitting: Radiation Oncology

## 2014-06-21 DIAGNOSIS — Z510 Encounter for antineoplastic radiation therapy: Secondary | ICD-10-CM | POA: Diagnosis not present

## 2014-06-24 ENCOUNTER — Ambulatory Visit
Admission: RE | Admit: 2014-06-24 | Discharge: 2014-06-24 | Disposition: A | Discharge status: Home / Self Care | Payer: BC Managed Care – PPO | Source: Ambulatory Visit | Attending: Radiation Oncology | Admitting: Radiation Oncology

## 2014-06-24 DIAGNOSIS — Z510 Encounter for antineoplastic radiation therapy: Secondary | ICD-10-CM | POA: Diagnosis not present

## 2014-06-25 ENCOUNTER — Ambulatory Visit
Admission: RE | Admit: 2014-06-25 | Discharge: 2014-06-25 | Disposition: A | Discharge status: Home / Self Care | Payer: BC Managed Care – PPO | Source: Ambulatory Visit | Attending: Radiation Oncology | Admitting: Radiation Oncology

## 2014-06-25 ENCOUNTER — Ambulatory Visit: Payer: BC Managed Care – PPO

## 2014-06-25 DIAGNOSIS — Z510 Encounter for antineoplastic radiation therapy: Secondary | ICD-10-CM | POA: Diagnosis not present

## 2014-06-26 ENCOUNTER — Encounter: Payer: Self-pay | Admitting: Radiation Oncology

## 2014-06-26 ENCOUNTER — Ambulatory Visit
Admission: RE | Admit: 2014-06-26 | Discharge: 2014-06-26 | Disposition: A | Discharge status: Home / Self Care | Payer: BC Managed Care – PPO | Source: Ambulatory Visit | Attending: Radiation Oncology | Admitting: Radiation Oncology

## 2014-06-26 ENCOUNTER — Ambulatory Visit: Payer: BC Managed Care – PPO

## 2014-06-26 VITALS — BP 128/89 | HR 71 | Resp 16 | Wt 211.3 lb

## 2014-06-26 DIAGNOSIS — C61 Malignant neoplasm of prostate: Secondary | ICD-10-CM

## 2014-06-26 DIAGNOSIS — Z510 Encounter for antineoplastic radiation therapy: Secondary | ICD-10-CM | POA: Diagnosis not present

## 2014-06-26 NOTE — Progress Notes (Signed)
Weight and vitals stable. Denies pain. Reports nocturia every hour. Denies urgency, incontinence, leakage, dysuria or hematuria. Denies diarrhea. Reports fatigue. One month follow up appointment scheduled. Understands to contact staff with future needs.

## 2014-06-26 NOTE — Progress Notes (Signed)
   Department of Radiation Oncology  Phone:  [phone] Fax:        [phone]  Weekly Treatment Note    Name: Nathan Berg Date: 06/26/2014 MRN: [id number] DOB: Jan 20, 1956   Current dose: 68.4 Gy  Current fraction: 38   MEDICATIONS: Current Outpatient Prescriptions  Medication Sig Dispense Refill  . Cimetidine (TAGAMET HB PO) Take 1 tablet by mouth 2 (two) times daily as needed (heart burn).    . diphenhydramine-acetaminophen (TYLENOL PM) 25-500 MG TABS Take 1 tablet by mouth at bedtime as needed.    Marland Kitchen MELATONIN PO Take by mouth.    . tadalafil (CIALIS) 5 MG tablet Take 5 mg by mouth daily as needed for erectile dysfunction.     No current facility-administered medications for this encounter.     ALLERGIES: Codeine   LABORATORY DATA:  Lab Results  Component Value Date   WBC 5.6 09/12/2013   HGB 13.0 09/20/2013   HCT 38.7* 09/20/2013   MCV 90.2 09/12/2013   PLT 251 09/12/2013   Lab Results  Component Value Date   NA 136* 09/20/2013   K 4.7 09/20/2013   CL 102 09/20/2013   CO2 24 09/20/2013   No results found for: ALT, AST, GGT, ALKPHOS, BILITOT   NARRATIVE: Nathan Berg was seen today for weekly treatment management. The chart was checked and the patient's films were reviewed.  Weight and vitals stable. Denies pain. Reports nocturia every hour. Denies urgency, incontinence, leakage, dysuria or hematuria. Denies diarrhea. Reports fatigue. One month follow up appointment scheduled. Understands to contact staff with future needs.  The patient is doing very well today and has no acute issues  PHYSICAL EXAMINATION: weight is 211 lb 4.8 oz (95.845 kg). His blood pressure is 128/89 and his pulse is 71. His respiration is 16.        ASSESSMENT: The patient did satisfactorily with treatment.  PLAN:  Follow-up in one month.

## 2014-06-27 ENCOUNTER — Ambulatory Visit: Payer: BC Managed Care – PPO

## 2014-06-30 NOTE — Progress Notes (Signed)
  Radiation Oncology         [phone]) [phone] ________________________________  Name: Nathan Berg MRN: [id number]  Date: 06/26/2014  DOB: 02-14-1956  End of Treatment Note  DIAGNOSIS: 58 y.o. gentleman with stage T2c adenocarcinoma of the prostate with a Gleason's score of 4+3, a preoperative PSA of 9.13 and post-prostatectomy rising PSA of 0.09  Indication for treatment:  Curative, Salvage Prostatic Fossa Radiotherapy     Radiation treatment dates:   05/06/2014-06/26/2014  Site/dose:   The prostatic fossa was treated to 68.4 Gy in 38 fractions of 1.8 Gy  Beams/energy:   The prostatic fossa was treated with IMRT using volumetric arc therapy delivering 6 MV X-rays to clockwise and counterclockwise circumferential arcs with a 90 degree collimator offset to avoid dose scalloping.  Image guidance was performed with daily cone beam CT prior to each fraction to align to gold markers in the prostate and assure proper bladder and rectal fill volumes.  Immobilization was achieved with BodyFix custom mold.  Narrative: The patient tolerated radiation treatment relatively well.   He experienced frequent nocturia, but, no other notable issues.  Plan: The patient has completed radiation treatment. The patient will return to radiation oncology clinic for routine followup in one month. I advised them to call or return sooner if they have any questions or concerns related to their recovery or treatment. ________________________________  Sheral Apley. Tammi Klippel, M.D.

## 2014-07-01 ENCOUNTER — Ambulatory Visit: Payer: BC Managed Care – PPO

## 2014-07-01 ENCOUNTER — Telehealth: Payer: Self-pay | Admitting: Radiation Oncology

## 2014-07-01 NOTE — Telephone Encounter (Signed)
Opened in error

## 2014-07-02 ENCOUNTER — Ambulatory Visit: Payer: BC Managed Care – PPO

## 2014-08-01 ENCOUNTER — Encounter: Payer: Self-pay | Admitting: Radiation Oncology

## 2014-08-01 ENCOUNTER — Ambulatory Visit
Admission: RE | Admit: 2014-08-01 | Discharge: 2014-08-01 | Disposition: A | Discharge status: Home / Self Care | Payer: BLUE CROSS/BLUE SHIELD | Source: Ambulatory Visit | Attending: Radiation Oncology | Admitting: Radiation Oncology

## 2014-08-01 VITALS — BP 133/84 | HR 70 | Resp 16 | Wt 215.1 lb

## 2014-08-01 DIAGNOSIS — C61 Malignant neoplasm of prostate: Secondary | ICD-10-CM

## 2014-08-01 NOTE — Progress Notes (Signed)
  Radiation Oncology         [phone] ________________________________  Name: Nathan Berg MRN: [id number]  Date: 08/01/2014  DOB: 03-31-1956  Follow-Up Visit Note  CC: No primary care provider on file.  Bernestine Amass, MD  Diagnosis:   59 y.o. gentleman with stage T2c adenocarcinoma of the prostate with a Gleason's score of 4+3, a preoperative PSA of 9.13 and post-prostatectomy rising PSA of 0.09 s/p Salvage Prostatic Fossa Radiotherapy 05/06/2014-06/26/2014 to 68.4 Gy with androgen deprivation.    ICD-9-CM ICD-10-CM   1. Malignant neoplasm of prostate 185 C61     Interval Since Last Radiation:  4  weeks  Narrative:  The patient returns today for routine follow-up.  Denies pain. Reports nocturia every hour and a half. Denies urgency, incontinence, leakage, dysuria or hematuria. Denies diarrhea. Reports fatigue. Reports pain associated with bowel movements. Denies blood in stool. Reports that he has began to use Preparation H for relief and it seems to help. Denies headache, dizziness, nausea or vomiting. Received last hormone injection on December 30th. PSA was .01 when tested by PCP first of this year. Plans to follow up with Dr. Risa Grill in a few weeks for blood work                              ALLERGIES:  is allergic to codeine.  Meds: Current Outpatient Prescriptions  Medication Sig Dispense Refill  . traMADol (ULTRAM) 50 MG tablet Take by mouth every 6 (six) hours as needed.    . Cimetidine (TAGAMET HB PO) Take 1 tablet by mouth 2 (two) times daily as needed (heart burn).    . diphenhydramine-acetaminophen (TYLENOL PM) 25-500 MG TABS Take 1 tablet by mouth at bedtime as needed.    Marland Kitchen MELATONIN PO Take by mouth.    . tadalafil (CIALIS) 5 MG tablet Take 5 mg by mouth daily as needed for erectile dysfunction.     No current facility-administered medications for this encounter.    Physical Findings: The patient is in no acute distress. Patient is alert and oriented.  weight is  215 lb 1.6 oz (97.569 kg). His blood pressure is 133/84 and his pulse is 70. His respiration is 16. .  No significant changes.  Lab Findings: Lab Results  Component Value Date   WBC 5.6 09/12/2013   HGB 13.0 09/20/2013   HCT 38.7* 09/20/2013   MCV 90.2 09/12/2013   PLT 251 09/12/2013   Impression:  The patient is recovering from the effects of radiation.    Plan:  He will continue to follow-up with urology for ongoing PSA determinations.  I will look forward to following his response through their correspondence, and be happy to participate in care if clinically indicated.  I talked to the patient about what to expect in the future, including his risk for erectile dysfunction and rectal bleeding.  I encouraged him to call or return to the office if he has any question about his previous radiation or possible radiation effects.  He was comfortable with this plan.   _____________________________________  Sheral Apley. Tammi Klippel, M.D.

## 2014-08-01 NOTE — Progress Notes (Addendum)
Weight and vitals stable. Denies pain. Reports nocturia every hour and a half. Denies urgency, incontinence, leakage, dysuria or hematuria. Denies diarrhea. Reports fatigue. Reports pain associated with bowel movements. Denies blood in stool. Reports that he has began to use Preparation H for relief and it seems to help. Denies headache, dizziness, nausea or vomiting. Received last hormone injection on December 30th. PSA was .01 when tested by PCP first of this year. Plans to follow up with Dr. Risa Grill in a few weeks for blood work.

## 2014-11-13 ENCOUNTER — Encounter: Payer: Self-pay | Admitting: Internal Medicine

## 2015-07-08 ENCOUNTER — Encounter: Payer: Self-pay | Admitting: Internal Medicine

## 2015-08-19 ENCOUNTER — Encounter: Payer: BLUE CROSS/BLUE SHIELD | Admitting: Internal Medicine

## 2015-09-01 ENCOUNTER — Encounter: Payer: BLUE CROSS/BLUE SHIELD | Admitting: Internal Medicine

## 2017-05-24 ENCOUNTER — Ambulatory Visit: Payer: BLUE CROSS/BLUE SHIELD | Admitting: Internal Medicine

## 2019-04-12 ENCOUNTER — Telehealth: Payer: Self-pay | Admitting: Oncology

## 2019-04-12 NOTE — Telephone Encounter (Signed)
Received a new patient referral from Dr. Louis Meckel for recurrent prostate cancer. Mr. Nathan Berg has been scheduled to see Dr. Alen Blew on 10/16 at 2pm. I provided the appt date and time to the pt's wife. She's been made aware to have Mr. Kopko arrive 15 minutes early.

## 2019-04-13 ENCOUNTER — Telehealth: Payer: Self-pay | Admitting: Oncology

## 2019-04-13 NOTE — Telephone Encounter (Signed)
Pt cld and confirmed appt

## 2019-04-20 ENCOUNTER — Other Ambulatory Visit: Payer: Self-pay

## 2019-04-20 ENCOUNTER — Inpatient Hospital Stay: Payer: Medicare Other | Attending: Oncology | Admitting: Oncology

## 2019-04-20 VITALS — BP 126/97 | HR 68 | Temp 98.2°F | Resp 18 | Ht 71.0 in | Wt 168.2 lb

## 2019-04-20 DIAGNOSIS — C61 Malignant neoplasm of prostate: Secondary | ICD-10-CM | POA: Diagnosis not present

## 2019-04-20 NOTE — Progress Notes (Signed)
Reason for the request:    Prostate cancer  HPI: I was asked by Dr. Louis Meckel to evaluate Nathan Berg for prostate cancer.  He is a 63 year old diagnosed with prostate cancer in 2015.  At that time he presented with a PSA of 9.13 and a biopsy at that time showed a Gleason score 4+3 = 7.  He has subsequently underwent a robotic assisted laparoscopic radical prostatectomy and bilateral lymph node dissection in March 2015.  The final pathological staging was T2CN0.  His Gleason score was 4+3 = 7 in both lobes without any evidence of angiolymphatic invasion or extraprostatic extension.  No lymph node involvement at that time.  His PSA remained undetectable postoperatively but was rising subsequent to that.  His PSA was up to 0.09 in October 2015.  He subsequently received salvage radiation under the care of Dr. Tammi Klippel after receiving 38.4 Pearline Cables in 38 fractions between May 06, 2014 and June 26, 2014.  He has received 6 months of androgen deprivation utilizing Lupron.  His a PSA was 0.086 in 2017 and the testosterone level was low at that time despite stopping androgen deprivation.  He subsequently started on testosterone replacement therapy with improvement in his testosterone level.  In 2018 his PSA remained low at 0.2 and subsequently 0.68.  In 2019 his PSA started to rise up to 0.94 in April 2019.  His PSA in October 2019 was 1.07, in December 2019 his PSA was 0.83, in July 2020 his PSA was 1.27, in October 2020 his PSA was 2.35 with a testosterone level of 245.  At that time he stopped testosterone replacement therapy.  Clinically, he does report some fatigue and some tiredness but still able to perform all activities of daily living.  He denies any recent hospitalization or illnesses.  He denies any bone pain or pathological fractures.   He does not report any headaches, blurry vision, syncope or seizures. Does not report any fevers, chills or sweats.  Does not report any cough, wheezing or hemoptysis.   Does not report any chest pain, palpitation, orthopnea or leg edema.  Does not report any nausea, vomiting or abdominal pain.  Does not report any constipation or diarrhea.  Does not report any skeletal complaints.    Does not report frequency, urgency or hematuria.  Does not report any skin rashes or lesions. Does not report any heat or cold intolerance.  Does not report any lymphadenopathy or petechiae.  Does not report any anxiety or depression.  Remaining review of systems is negative.    Past Medical History:  Diagnosis Date  . Arthritis   . Cancer    dx. 2 months ago- Prostate cancer  . GERD (gastroesophageal reflux disease)   . H/O seasonal allergies   . History of kidney stones    x1  . Prostate cancer   :  Past Surgical History:  Procedure Laterality Date  . COLONOSCOPY    . HERNIA REPAIR Bilateral    20 yrs ago  . LYMPHADENECTOMY Bilateral 09/19/2013   Procedure: LYMPHADENECTOMY  PELVIC LYMPH NODE DISSECTION;  Surgeon: Bernestine Amass, MD;  Location: [facility];  Service: Urology;  Laterality: Bilateral;  . PROSTATE BIOPSY    . ROBOT ASSISTED LAPAROSCOPIC RADICAL PROSTATECTOMY N/A 09/19/2013   Procedure: ROBOTIC ASSISTED LAPAROSCOPIC RADICAL PROSTATECTOMY;  Surgeon: Bernestine Amass, MD;  Location: [facility];  Service: Urology;  Laterality: N/A;  . TONSILLECTOMY     child  . VARICOCELE EXCISION    :   Current  Outpatient Medications:  .  Cimetidine (TAGAMET HB PO), Take 1 tablet by mouth 2 (two) times daily as needed (heart burn)., Disp: , Rfl:  .  diphenhydramine-acetaminophen (TYLENOL PM) 25-500 MG TABS, Take 1 tablet by mouth at bedtime as needed., Disp: , Rfl:  .  MELATONIN PO, Take by mouth., Disp: , Rfl:  .  tadalafil (CIALIS) 5 MG tablet, Take 5 mg by mouth daily as needed for erectile dysfunction., Disp: , Rfl:  .  traMADol (ULTRAM) 50 MG tablet, Take by mouth every 6 (six) hours as needed., Disp: , Rfl: :  Allergies  Allergen Reactions  . Codeine Nausea And Vomiting   :  Family History  Problem Relation Age of Onset  . Cancer Father        prostate  :  Social History   Socioeconomic History  . Marital status: Married    Spouse name: Not on file  . Number of children: Not on file  . Years of education: Not on file  . Highest education level: Not on file  Occupational History  . Not on file  Social Needs  . Financial resource strain: Not on file  . Food insecurity    Worry: Not on file    Inability: Not on file  . Transportation needs    Medical: Not on file    Non-medical: Not on file  Tobacco Use  . Smoking status: Never Smoker  . Smokeless tobacco: Never Used  Substance and Sexual Activity  . Alcohol use: Yes    Comment: 4 beers per week  . Drug use: No  . Sexual activity: Yes  Lifestyle  . Physical activity    Days per week: Not on file    Minutes per session: Not on file  . Stress: Not on file  Relationships  . Social Herbalist on phone: Not on file    Gets together: Not on file    Attends religious service: Not on file    Active member of club or organization: Not on file    Attends meetings of clubs or organizations: Not on file    Relationship status: Not on file  . Intimate partner violence    Fear of current or ex partner: Not on file    Emotionally abused: Not on file    Physically abused: Not on file    Forced sexual activity: Not on file  Other Topics Concern  . Not on file  Social History Narrative  . Not on file  :  Pertinent items are noted in HPI.  Exam: Blood pressure (!) 126/97, pulse 68, temperature 98.2 F (36.8 C), temperature source Temporal, resp. rate 18, height 5\' 11"  (1.803 m), weight 168 lb 3.2 oz (76.3 kg), SpO2 98 %.  ECOG 0  General appearance: alert and cooperative appeared without distress. Head: atraumatic without any abnormalities. Eyes: conjunctivae/corneas clear. PERRL.  Sclera anicteric. Throat: lips, mucosa, and tongue normal; without oral thrush or  ulcers. Resp: clear to auscultation bilaterally without rhonchi, wheezes or dullness to percussion. Cardio: regular rate and rhythm, S1, S2 normal, no murmur, click, rub or gallop GI: soft, non-tender; bowel sounds normal; no masses,  no organomegaly Skin: Skin color, texture, turgor normal. No rashes or lesions Lymph nodes: Cervical, supraclavicular, and axillary nodes normal. Neurologic: Grossly normal without any motor, sensory or deep tendon reflexes. Musculoskeletal: No joint deformity or effusion.  CBC    Component Value Date/Time   WBC 5.6 09/12/2013 1435  RBC 4.91 09/12/2013 1435   HGB 13.0 09/20/2013 0433   HCT 38.7 (L) 09/20/2013 0433   PLT 251 09/12/2013 1435   MCV 90.2 09/12/2013 1435   MCH 31.8 09/12/2013 1435   MCHC 35.2 09/12/2013 1435   RDW 12.1 09/12/2013 1435     Assessment and Plan:   63 year old man with:  1.  Prostate cancer diagnosed in 2015.  At that time he was found to have a T2cN0 disease with a Gleason score 4+3 equal 7 and a PSA of 9.13.  He is status post radical prostatectomy and subsequently received salvage radiation therapy in 2016.  He did receive androgen deprivation therapy for about 6 months.  He has been on active surveillance at this time with a slow rise in his PSA concomitantly with testosterone replacement therapy.  His PSA in October 2020 was 2.35 which is an increase from 1.27 in July 2020.  Testosterone replacement has been discontinued at this time.  The natural course of this disease and treatment options were reviewed.  He is developing a biochemical relapse that is castration sensitive although his testosterone level is not normal.  It is certainly not at a castrate level with his rising PSA.  Treatment options at this time were reviewed which should include continued active surveillance at this time, instituting androgen deprivation therapy after PSA reaches a certain threshold in an intermittent fashion versus continuous  fashion.  The role of imaging studies for staging purposes was also discussed today in detail.  It would be reasonable to obtain Axumin PET scan if his PSA continues to rise at this current rate.  Risks and benefits of intermittent versus continuous androgen deprivation was reviewed.  Potential complications were also discussed which includes hot flashes as well as fatigue and tiredness.   After discussion today, he is agreeable to continue to stop testosterone replacement and repeat PSA in 3 months.  His PSA continues to rise staging work-up with the next Lupron scan and consideration for intermittent androgen deprivation if his PSA crosses a certain threshold which randomly could be 10.  If he has had metastatic disease noted on his imaging studies, androgen deprivation will be needed continuously.  All his questions were answered today to his satisfaction.  2.  Follow-up: I am happy to see him in the future as needed.  He will continue to follow with Dr. Louis Meckel regarding these issues.   60  minutes was spent with the patient face-to-face today.  More than 50% of time was spent on reviewing laboratory data, disease status update, treatment options and answering questions regarding future plan of care.     Thank you for the referral. A copy of this consult has been forwarded to the requesting physician.

## 2019-07-16 ENCOUNTER — Emergency Department (HOSPITAL_COMMUNITY): Payer: Medicare Other

## 2019-07-16 ENCOUNTER — Encounter (HOSPITAL_COMMUNITY): Payer: Self-pay

## 2019-07-16 ENCOUNTER — Other Ambulatory Visit: Payer: Self-pay

## 2019-07-16 ENCOUNTER — Inpatient Hospital Stay (HOSPITAL_COMMUNITY)
Admission: EM | Admit: 2019-07-16 | Discharge: 2019-07-19 | DRG: 200 | Disposition: A | Discharge status: Home / Self Care | Payer: Medicare Other | Attending: General Surgery | Admitting: General Surgery

## 2019-07-16 DIAGNOSIS — Z809 Family history of malignant neoplasm, unspecified: Secondary | ICD-10-CM | POA: Diagnosis not present

## 2019-07-16 DIAGNOSIS — Z885 Allergy status to narcotic agent status: Secondary | ICD-10-CM | POA: Diagnosis not present

## 2019-07-16 DIAGNOSIS — Z79891 Long term (current) use of opiate analgesic: Secondary | ICD-10-CM

## 2019-07-16 DIAGNOSIS — W5512XA Struck by horse, initial encounter: Secondary | ICD-10-CM | POA: Diagnosis not present

## 2019-07-16 DIAGNOSIS — S2243XA Multiple fractures of ribs, bilateral, initial encounter for closed fracture: Secondary | ICD-10-CM | POA: Diagnosis present

## 2019-07-16 DIAGNOSIS — Z23 Encounter for immunization: Secondary | ICD-10-CM | POA: Diagnosis present

## 2019-07-16 DIAGNOSIS — Z79899 Other long term (current) drug therapy: Secondary | ICD-10-CM | POA: Diagnosis not present

## 2019-07-16 DIAGNOSIS — Z8546 Personal history of malignant neoplasm of prostate: Secondary | ICD-10-CM | POA: Diagnosis not present

## 2019-07-16 DIAGNOSIS — K219 Gastro-esophageal reflux disease without esophagitis: Secondary | ICD-10-CM | POA: Diagnosis present

## 2019-07-16 DIAGNOSIS — J982 Interstitial emphysema: Secondary | ICD-10-CM | POA: Diagnosis present

## 2019-07-16 DIAGNOSIS — W501XXA Accidental kick by another person, initial encounter: Secondary | ICD-10-CM | POA: Diagnosis present

## 2019-07-16 DIAGNOSIS — R6884 Jaw pain: Secondary | ICD-10-CM | POA: Diagnosis present

## 2019-07-16 DIAGNOSIS — J302 Other seasonal allergic rhinitis: Secondary | ICD-10-CM | POA: Diagnosis present

## 2019-07-16 DIAGNOSIS — Z20822 Contact with and (suspected) exposure to covid-19: Secondary | ICD-10-CM | POA: Diagnosis present

## 2019-07-16 DIAGNOSIS — S270XXA Traumatic pneumothorax, initial encounter: Principal | ICD-10-CM | POA: Diagnosis present

## 2019-07-16 DIAGNOSIS — Z87442 Personal history of urinary calculi: Secondary | ICD-10-CM | POA: Diagnosis not present

## 2019-07-16 DIAGNOSIS — T797XXA Traumatic subcutaneous emphysema, initial encounter: Secondary | ICD-10-CM

## 2019-07-16 DIAGNOSIS — J939 Pneumothorax, unspecified: Secondary | ICD-10-CM

## 2019-07-16 DIAGNOSIS — K449 Diaphragmatic hernia without obstruction or gangrene: Secondary | ICD-10-CM | POA: Diagnosis present

## 2019-07-16 DIAGNOSIS — Z9079 Acquired absence of other genital organ(s): Secondary | ICD-10-CM

## 2019-07-16 DIAGNOSIS — S2241XA Multiple fractures of ribs, right side, initial encounter for closed fracture: Secondary | ICD-10-CM

## 2019-07-16 HISTORY — PX: CHEST TUBE INSERTION: SHX231

## 2019-07-16 LAB — I-STAT CHEM 8, ED
BUN: 20 mg/dL (ref 8–23)
Calcium, Ion: 1.16 mmol/L (ref 1.15–1.40)
Chloride: 102 mmol/L (ref 98–111)
Creatinine, Ser: 1.1 mg/dL (ref 0.61–1.24)
Glucose, Bld: 105 mg/dL — ABNORMAL HIGH (ref 70–99)
HCT: 42 % (ref 39.0–52.0)
Hemoglobin: 14.3 g/dL (ref 13.0–17.0)
Potassium: 4.3 mmol/L (ref 3.5–5.1)
Sodium: 140 mmol/L (ref 135–145)
TCO2: 32 mmol/L (ref 22–32)

## 2019-07-16 LAB — CBC WITH DIFFERENTIAL/PLATELET
Abs Immature Granulocytes: 0.03 10*3/uL (ref 0.00–0.07)
Basophils Absolute: 0 10*3/uL (ref 0.0–0.1)
Basophils Relative: 0 %
Eosinophils Absolute: 0 10*3/uL (ref 0.0–0.5)
Eosinophils Relative: 1 %
HCT: 43.4 % (ref 39.0–52.0)
Hemoglobin: 14.5 g/dL (ref 13.0–17.0)
Immature Granulocytes: 0 %
Lymphocytes Relative: 10 %
Lymphs Abs: 0.8 10*3/uL (ref 0.7–4.0)
MCH: 32.1 pg (ref 26.0–34.0)
MCHC: 33.4 g/dL (ref 30.0–36.0)
MCV: 96 fL (ref 80.0–100.0)
Monocytes Absolute: 0.6 10*3/uL (ref 0.1–1.0)
Monocytes Relative: 7 %
Neutro Abs: 6.5 10*3/uL (ref 1.7–7.7)
Neutrophils Relative %: 82 %
Platelets: 225 10*3/uL (ref 150–400)
RBC: 4.52 MIL/uL (ref 4.22–5.81)
RDW: 12.1 % (ref 11.5–15.5)
WBC: 8 10*3/uL (ref 4.0–10.5)
nRBC: 0 % (ref 0.0–0.2)

## 2019-07-16 LAB — BASIC METABOLIC PANEL
Anion gap: 6 (ref 5–15)
BUN: 20 mg/dL (ref 8–23)
CO2: 30 mmol/L (ref 22–32)
Calcium: 8.8 mg/dL — ABNORMAL LOW (ref 8.9–10.3)
Chloride: 105 mmol/L (ref 98–111)
Creatinine, Ser: 1.13 mg/dL (ref 0.61–1.24)
GFR calc Af Amer: >60 mL/min (ref >60)
GFR calc non Af Amer: >60 mL/min (ref >60)
Glucose, Bld: 112 mg/dL — ABNORMAL HIGH (ref 70–99)
Potassium: 4.3 mmol/L (ref 3.5–5.1)
Sodium: 141 mmol/L (ref 135–145)

## 2019-07-16 LAB — TYPE AND SCREEN
ABO/RH(D): O POS
Antibody Screen: NEGATIVE

## 2019-07-16 LAB — RESPIRATORY PANEL BY RT PCR (FLU A&B, COVID)
Influenza A by PCR: NEGATIVE
Influenza B by PCR: NEGATIVE
SARS Coronavirus 2 by RT PCR: NEGATIVE

## 2019-07-16 MED ORDER — BUPROPION HCL ER (SR) 150 MG PO TB12
300.0000 mg | ORAL_TABLET | Freq: Two times a day (BID) | ORAL | Status: DC
  Filled 2019-07-16: qty 2

## 2019-07-16 MED ORDER — BUPROPION HCL ER (SR) 100 MG PO TB12
200.0000 mg | ORAL_TABLET | Freq: Two times a day (BID) | ORAL | Status: DC
  Administered 2019-07-17 – 2019-07-19 (×4): 200 mg via ORAL
  Filled 2019-07-16 (×5): qty 2

## 2019-07-16 MED ORDER — ACETAMINOPHEN 325 MG PO TABS
650.0000 mg | ORAL_TABLET | ORAL | Status: DC | PRN
  Administered 2019-07-17: 650 mg via ORAL
  Filled 2019-07-16: qty 2

## 2019-07-16 MED ORDER — SODIUM CHLORIDE (PF) 0.9 % IJ SOLN
INTRAMUSCULAR | Status: AC
  Filled 2019-07-16: qty 50

## 2019-07-16 MED ORDER — CALCIUM CARBONATE ANTACID 500 MG PO CHEW
1.0000 | CHEWABLE_TABLET | Freq: Three times a day (TID) | ORAL | Status: DC | PRN
  Administered 2019-07-17: 200 mg via ORAL
  Filled 2019-07-16: qty 1

## 2019-07-16 MED ORDER — FENTANYL CITRATE (PF) 100 MCG/2ML IJ SOLN
50.0000 ug | Freq: Once | INTRAMUSCULAR | Status: AC
  Administered 2019-07-16: 50 ug via INTRAVENOUS
  Filled 2019-07-16: qty 2

## 2019-07-16 MED ORDER — LACTATED RINGERS IV SOLN: INTRAVENOUS | Status: DC

## 2019-07-16 MED ORDER — SODIUM CHLORIDE 0.9 % IV SOLN: Freq: Once | INTRAVENOUS | Status: AC

## 2019-07-16 MED ORDER — ONDANSETRON HCL 4 MG/2ML IJ SOLN
4.0000 mg | Freq: Four times a day (QID) | INTRAMUSCULAR | Status: DC | PRN
  Administered 2019-07-17 (×2): 4 mg via INTRAVENOUS
  Filled 2019-07-16 (×3): qty 2

## 2019-07-16 MED ORDER — DOCUSATE SODIUM 100 MG PO CAPS
100.0000 mg | ORAL_CAPSULE | Freq: Two times a day (BID) | ORAL | Status: DC
  Administered 2019-07-17 – 2019-07-18 (×5): 100 mg via ORAL
  Filled 2019-07-16 (×5): qty 1

## 2019-07-16 MED ORDER — ONDANSETRON 4 MG PO TBDP: 4.0000 mg | ORAL_TABLET | Freq: Four times a day (QID) | ORAL | Status: DC | PRN

## 2019-07-16 MED ORDER — TRAMADOL HCL 50 MG PO TABS
50.0000 mg | ORAL_TABLET | Freq: Four times a day (QID) | ORAL | Status: DC | PRN
  Administered 2019-07-17: 50 mg via ORAL
  Filled 2019-07-16: qty 1

## 2019-07-16 MED ORDER — IOHEXOL 300 MG/ML  SOLN
75.0000 mL | Freq: Once | INTRAMUSCULAR | Status: AC | PRN
  Administered 2019-07-16: 75 mL via INTRAVENOUS

## 2019-07-16 MED ORDER — MORPHINE SULFATE (PF) 2 MG/ML IV SOLN
2.0000 mg | INTRAVENOUS | Status: DC | PRN
  Administered 2019-07-17 (×2): 2 mg via INTRAVENOUS
  Filled 2019-07-16 (×2): qty 1

## 2019-07-16 MED ORDER — LIDOCAINE HCL (PF) 1 % IJ SOLN
30.0000 mL | Freq: Once | INTRAMUSCULAR | Status: AC
  Administered 2019-07-16: 30 mL via INTRADERMAL
  Filled 2019-07-16: qty 30

## 2019-07-16 MED ORDER — ENOXAPARIN SODIUM 30 MG/0.3ML ~~LOC~~ SOLN
30.0000 mg | Freq: Two times a day (BID) | SUBCUTANEOUS | Status: DC
  Administered 2019-07-17 – 2019-07-18 (×5): 30 mg via SUBCUTANEOUS
  Filled 2019-07-16 (×5): qty 0.3

## 2019-07-16 NOTE — Procedures (Signed)
   Procedure Note  Date: 07/16/2019  Procedure: tube thoracostomy--left    Pre-op diagnosis: left pneumothorax  Post-op diagnosis: same  Surgeon: Jesusita Oka, MD  Anesthesia: local   EBL: <5cc procedural evacuated Drains/Implants: 41F chest tube Specimen: none  Description of procedure: Time-out was performed verifying correct patient, procedure, site, laterality, and signature of informed consent. Ten cc's of local anesthetic was infiltrated into the tissues just over the fourth intercostal space.  A small skin nick was made at the fourth intercostal space. An introducer needle was inserted and a guidewire inserted through the needle. The needle was removed and the tract dilated. The chest tube was inserted over the guidewire and the guidewire removed.   The tube was secured at the skin with suture and connected to an atrium at -20cm water wall suction. Immediate output from the chest tube was 0cc. The site was dressed with xeroform, gauze, and tape. The patient tolerated the procedure well. There were no complications. Follow up chest x-ray was ordered to confirm tube positioning, complete evacuation, and complete lung re-expansion.    Jesusita Oka, MD [facility]

## 2019-07-16 NOTE — ED Provider Notes (Signed)
  Patient placed in Quick Look pathway, seen and evaluated   Chief Complaint: Left rib pain and shortness of breath  HPI:   Patient was kicked by a horse 2 days ago, forcefully throwing the left side of his chest against a fence.  He has had increasing pain and shortness of breath since that time.  He was evaluated at an urgent care today where x-ray noted suspected subcutaneous emphysema as well as left rib fracture.  He was sent to the ED for CT of the chest.  ROS: Left rib pain and shortness of breath   Physical Exam:   Gen: No distress  Neuro: Awake and Alert  Skin: Warm    Focused Exam:   No diaphoresis.  No pallor.  Pulmonary: No increased work of breathing.  Speaks in full sentences without difficulty.  Lung sounds clear.  No tachypnea.  Chest wall: Subcutaneous emphysema palpated in the left side of the chest.  Deformity of the left anterior chest wall in the area of ribs 4-5.  No color abnormality.  No noted flail segment.  Cardiac: Normal rate and regular. Peripheral pulses intact.  Abdominal: No abdominal tenderness.  No peritoneal signs.  No rebound tenderness.  No guarding.  No bruising.    Initiation of care has begun. The patient has been counseled on the process, plan, and necessity for staying for the completion/evaluation, and the remainder of the medical screening examination   Layla Maw 07/16/19 2023    Lajean Saver, MD 07/16/19 2249

## 2019-07-16 NOTE — ED Notes (Signed)
Patient transported to CT 
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
## 2019-07-16 NOTE — ED Notes (Signed)
[facility] at bedside 

## 2019-07-16 NOTE — ED Provider Notes (Signed)
[facility] DEPT Provider Note   CSN: [id number] Arrival date & time: 07/16/19  1755     History Chief Complaint  Patient presents with  . Rib Injury    Nathan Berg is a 64 y.o. male with a past medical history of prostate cancer who presents today for evaluation of left-sided chest pain. On Saturday, 2 days ago, he was kicked by a horse which through the left side of his chest and drawn into a fence.  He reports he has been having increasing pain and shortness of breath at that time.  He was seen at a [facility] facility today for this where he had an x-ray and was sent to the emergency room for CT evaluation.  Reports that over the past 2 to 3 hours he has had mild increased shortness of breath however it is not severe.  He denies any nausea or vomiting.  He does report that he hit the left jaw on the fence also and states that he feels like a tooth may be dragging when he moves his jaw side to side, however has been able to eat and chew without difficulty.  Chart review shows that 06/04/2013 he had a Tdap with [facility].    HPI     Past Medical History:  Diagnosis Date  . Arthritis   . Cancer ([place])    dx. 2 months ago- Prostate cancer  . GERD (gastroesophageal reflux disease)   . H/O seasonal allergies   . History of kidney stones    x1  . Prostate cancer [facility])     Patient Active Problem List   Diagnosis Date Noted  . Malignant neoplasm of prostate ([place]) 09/19/2013    Past Surgical History:  Procedure Laterality Date  . COLONOSCOPY    . HERNIA REPAIR Bilateral    20 yrs ago  . LYMPHADENECTOMY Bilateral 09/19/2013   Procedure: LYMPHADENECTOMY  PELVIC LYMPH NODE DISSECTION;  Surgeon: Bernestine Amass, MD;  Location: [facility];  Service: Urology;  Laterality: Bilateral;  . PROSTATE BIOPSY    . ROBOT ASSISTED LAPAROSCOPIC RADICAL PROSTATECTOMY N/A 09/19/2013   Procedure: ROBOTIC ASSISTED LAPAROSCOPIC RADICAL PROSTATECTOMY;  Surgeon:  Bernestine Amass, MD;  Location: [facility];  Service: Urology;  Laterality: N/A;  . TONSILLECTOMY     child  . VARICOCELE EXCISION         Family History  Problem Relation Age of Onset  . Cancer Father        prostate    Social History   Tobacco Use  . Smoking status: Never Smoker  . Smokeless tobacco: Never Used  Substance Use Topics  . Alcohol use: Yes    Comment: 4 beers per week  . Drug use: No    Home Medications Prior to Admission medications   Medication Sig Start Date End Date Taking? Authorizing Provider  buPROPion (WELLBUTRIN XL) 300 MG 24 hr tablet Take 300 mg by mouth 2 (two) times daily.   Yes [provider]  diphenhydramine-acetaminophen (TYLENOL PM) 25-500 MG TABS Take 1 tablet by mouth at bedtime as needed.   Yes [provider]  traMADol (ULTRAM) 50 MG tablet Take 12.5 mg by mouth every 6 (six) hours as needed for moderate pain.    Yes [provider]  UNKNOWN TO PATIENT Take 1 tablet by mouth daily.   Yes [provider]    Allergies    Codeine  Review of Systems   Review of Systems  Constitutional: Negative for chills and fever.  HENT:       Left sided jaw pain  Eyes: Negative for visual disturbance.  Respiratory: Positive for shortness of breath. Negative for cough.   Cardiovascular: Positive for chest pain. Negative for palpitations and leg swelling.  Gastrointestinal: Negative for abdominal pain.  Genitourinary: Negative for dysuria.  Musculoskeletal: Negative for back pain and neck pain.  Skin: Positive for wound. Negative for color change.  Neurological: Negative for weakness and headaches.  Psychiatric/Behavioral: Negative for confusion.  All other systems reviewed and are negative.   Physical Exam Updated Vital Signs BP 133/68 (BP Location: Right Arm)   Pulse 74   Temp 99.3 F (37.4 C) (Oral)   Resp 17   SpO2 96%   Physical Exam Vitals and nursing note reviewed.  Constitutional:      General:  He is not in acute distress.    Appearance: He is well-developed. He is not diaphoretic.  HENT:     Head: Normocephalic.     Comments: Patient is able to open and close his mouth without crepitus or deformity palpated. There is a 1 cm abrasion over the left lateral jaw. Eyes:     General: No scleral icterus.       Right eye: No discharge.        Left eye: No discharge.     Conjunctiva/sclera: Conjunctivae normal.  Neck:     Vascular: No JVD.     Trachea: Trachea and phonation normal.     Comments: Trachea is midline Cardiovascular:     Rate and Rhythm: Normal rate and regular rhythm.     Pulses: Normal pulses.     Heart sounds: Normal heart sounds.  Pulmonary:     Effort: Pulmonary effort is normal. No accessory muscle usage or respiratory distress.     Breath sounds: Examination of the left-upper field reveals decreased breath sounds. Examination of the left-middle field reveals decreased breath sounds. Examination of the left-lower field reveals decreased breath sounds. Decreased breath sounds present.  Chest:     Chest wall: Tenderness (Left sided chest tenderness with area of deformity on left anteriolateral wall.  ) present.     Comments: There is extensive subcutaneous air palpable along the left-sided chest wall, and the left supraclavicular fossa.   Abdominal:     General: There is no distension.     Tenderness: There is no abdominal tenderness. There is no guarding.  Musculoskeletal:        General: No deformity.     Cervical back: Full passive range of motion without pain, normal range of motion and neck supple.  Skin:    General: Skin is warm and dry.  Neurological:     General: No focal deficit present.     Mental Status: He is alert and oriented to person, place, and time.     Motor: No abnormal muscle tone.  Psychiatric:        Mood and Affect: Mood normal.        Behavior: Behavior normal.     ED Results / Procedures / Treatments   Labs (all labs ordered  are listed, but only abnormal results are displayed) Labs Reviewed  BASIC METABOLIC PANEL - Abnormal; Notable for the following components:      Result Value   Glucose, Bld 112 (*)    Calcium 8.8 (*)    All other components within normal limits  I-STAT CHEM 8, ED - Abnormal; Notable for the following  components:   Glucose, Bld 105 (*)    All other components within normal limits  RESPIRATORY PANEL BY RT PCR (FLU A&B, COVID)  CBC WITH DIFFERENTIAL/PLATELET  TYPE AND SCREEN    EKG None  Radiology CT Chest W Contrast  Result Date: 07/16/2019 CLINICAL DATA:  Left-sided chest pain and shortness of breath. Kicked by horse 2 days ago EXAM: CT CHEST WITH CONTRAST TECHNIQUE: Multidetector CT imaging of the chest was performed during intravenous contrast administration. CONTRAST:  46mL OMNIPAQUE IOHEXOL 300 MG/ML  SOLN COMPARISON:  None. FINDINGS: Cardiovascular: Normal heart size. No pericardial effusion. Thoracic aorta is normal in caliber. Pulmonary arteries are nondilated. Mediastinum/Nodes: Extensive pneumomediastinum dissecting into the base of the neck, more pronounced on the left. No significant shift of the mediastinal structures. No axillary, mediastinal, or hilar lymphadenopathy. Moderate-sized hiatal hernia. Lungs/Pleura: Small to moderate-sized left-sided pneumothorax. Small amount of layering fluid in the left pleural space. Left lower lobe and lingular atelectasis. Right lung is clear. No right-sided pleural effusion. Upper Abdomen: No acute abnormality. Musculoskeletal: Nondisplaced anterolateral left third rib fracture (series 3, image 67). Mildly displaced lateral left fourth rib fracture (series 3, image 84) with additional nondisplaced component slightly more anteriorly (series 3, image 93). Nondisplaced lateral left fifth rib fracture (series 3, image 94). There is extensive subcutaneous emphysema overlying the left chest wall. IMPRESSION: 1. Small to moderate-sized left-sided  pneumothorax. Small amount of dependent fluid within the pleural space suggesting a component of hemopneumothorax. No shift of mediastinal structures. 2. Extensive pneumomediastinum dissecting into the base of the neck, more pronounced on the left. 3. Acute left-sided third, fourth, and fifth rib fractures with extensive subcutaneous emphysema throughout the left chest wall. 4. Left lower lobe and lingular atelectasis. 5. Moderate-sized hiatal hernia. These results were called by telephone at the time of interpretation on 07/16/2019 at 9:23 pm to provider [facility] , who verbally acknowledged these results. Electronically Signed   By: Davina Poke D.O.   On: 07/16/2019 21:25    Procedures .Critical Care Performed by: Lorin Glass, PA-C Authorized by: Lorin Glass, PA-C   Critical care provider statement:    Critical care time (minutes):  45   Critical care was necessary to treat or prevent imminent or life-threatening deterioration of the following conditions:  Trauma   Critical care was time spent personally by me on the following activities:  Discussions with consultants, evaluation of patient's response to treatment, examination of patient, ordering and performing treatments and interventions, ordering and review of laboratory studies, ordering and review of radiographic studies, pulse oximetry, re-evaluation of patient's condition, obtaining history from patient or surrogate and review of old charts Comments:     Trauma requiring transfer    (including critical care time)  Medications Ordered in ED Medications  sodium chloride (PF) 0.9 % injection (has no administration in time range)  fentaNYL (SUBLIMAZE) injection 50 mcg (has no administration in time range)  0.9 %  sodium chloride infusion (has no administration in time range)  iohexol (OMNIPAQUE) 300 MG/ML solution 75 mL (75 mLs Intravenous Contrast Given 07/16/19 2055)    ED Course  I have reviewed the triage  vital signs and the nursing notes.  Pertinent labs & imaging results that were available during my care of the patient were reviewed by me and considered in my medical decision making (see chart for details).  Clinical Course as of Jul 16 2315  Mon Jul 16, 2019  2144 I spoke with Dr. Lucia Gaskins of  general surgery, who request ED to ED transfer over to [facility] for trauma to see and evaluate patient for admission.He states that his patient had his injury 2 days ago with stable vital signs he does not need a chest tube placed prior to transfer.  He states that he will speak with on-call trauma Dr.   [EH]    Clinical Course User Index [EH] Ollen Gross   MDM Rules/Calculators/A&P                     Patient presents today for evaluation after he initially went to an urgent care center and was referred to the emergency room for possible pneumothorax.  On evaluation he has significant subcutaneous emphysema with palpable crepitus throughout the left side of his chest, supraclavicular fossa and left side of his neck.  He was satting at 95% on room air, not tachycardic or tachypneic.  No evidence of tracheal deviation or tension pneumo.  CT scan of the chest was obtained showing a left-sided hemopneumothorax that is small to moderate sized, extensive pneumomediastinum, left-sided third, fourth, and fifth rib fractures with subcutaneous emphysema throughout the left-sided chest and a moderate-sized hiatal hernia.  I spoke with Dr. Lucia Gaskins of general surgery who requested patient be transferred to [facility], stating he would speak with Dr. Bobbye Morton, on-call for trauma at [facility].    2-hour Covid test was sent, negative for Covid flu a flu B.  He is not significantly anemic here.  I spoke with Dr. Laverta Baltimore in the emergency room at [facility] who will accept the patient in ED to ED transfer.    As patient's injury was 2 days ago and he is not significantly tachycardic or tachypneic, with  discussions and Dr. Lucia Gaskins will delay chest tube placement per team at [facility].    He does report mild left sided jaw pain from this accident.  Given his chest injury will defer further jaw evaluation to admitting team.   Patient transferred to [facility].   Note: Portions of this report may have been transcribed using voice recognition software. Every effort was made to ensure accuracy; however, inadvertent computerized transcription errors may be present   Final Clinical Impression(s) / ED Diagnoses Final diagnoses:  Closed fracture of multiple ribs of right side, initial encounter  Traumatic pneumothorax, initial encounter  Subcutaneous emphysema, initial encounter ([place])  Pneumomediastinum ([place])  Jaw pain    Rx / DC Orders ED Discharge Orders    None       Ollen Gross 07/16/19 2327    Gareth Morgan, MD 07/18/19 [phone]

## 2019-07-16 NOTE — H&P (Signed)
[facility] H&P  07/16/2019, 11:17 PM   Activation and Reason: Non-activation, transfer from [facility] for PTX Consult: Tomi Bamberger, MD  The patient is an 64 y.o. male.   HPI: 21M s/p being knocked by the tail end of a horse and landing on his left side onto a railing. He reports hitting both his chest and face. Denies LOC.   Past Medical History:  Diagnosis Date  . Arthritis   . Cancer ([place])    dx. 2 months ago- Prostate cancer  . GERD (gastroesophageal reflux disease)   . H/O seasonal allergies   . History of kidney stones    x1  . Prostate cancer [facility])     Past Surgical History:  Procedure Laterality Date  . COLONOSCOPY    . HERNIA REPAIR Bilateral    20 yrs ago  . LYMPHADENECTOMY Bilateral 09/19/2013   Procedure: LYMPHADENECTOMY  PELVIC LYMPH NODE DISSECTION;  Surgeon: Bernestine Amass, MD;  Location: [facility];  Service: Urology;  Laterality: Bilateral;  . PROSTATE BIOPSY    . ROBOT ASSISTED LAPAROSCOPIC RADICAL PROSTATECTOMY N/A 09/19/2013   Procedure: ROBOTIC ASSISTED LAPAROSCOPIC RADICAL PROSTATECTOMY;  Surgeon: Bernestine Amass, MD;  Location: [facility];  Service: Urology;  Laterality: N/A;  . TONSILLECTOMY     child  . VARICOCELE EXCISION      Family History  Problem Relation Age of Onset  . Cancer Father        prostate    Social History:  reports that he has never smoked. He has never used smokeless tobacco. He reports current alcohol use. He reports that he does not use drugs.  Allergies:  Allergies  Allergen Reactions  . Codeine Nausea And Vomiting    Medications: I have reviewed the patient's current medications.  - Wellbutrin 300BID - "something similar to prozac" - tramadol  Results for orders placed or performed during the hospital encounter of 07/16/19 (from the past 48 hour(s))  Basic metabolic panel     Status: Abnormal   Collection Time: 07/16/19  8:26 PM  Result Value Ref Range   Sodium 141 135 - 145 mmol/L   Potassium 4.3 3.5 - 5.1 mmol/L   Chloride 105 98  - 111 mmol/L   CO2 30 22 - 32 mmol/L   Glucose, Bld 112 (H) 70 - 99 mg/dL   BUN 20 8 - 23 mg/dL   Creatinine, Ser 1.13 0.61 - 1.24 mg/dL   Calcium 8.8 (L) 8.9 - 10.3 mg/dL   GFR calc non Af Amer >60 >60 mL/min   GFR calc Af Amer >60 >60 mL/min   Anion gap 6 5 - 15    Comment: Performed at [facility], [place] [address] [place], [place] [address]  CBC with Differential     Status: None   Collection Time: 07/16/19  8:26 PM  Result Value Ref Range   WBC 8.0 4.0 - 10.5 K/uL   RBC 4.52 4.22 - 5.81 MIL/uL   Hemoglobin 14.5 13.0 - 17.0 g/dL   HCT 43.4 39.0 - 52.0 %   MCV 96.0 80.0 - 100.0 fL   MCH 32.1 26.0 - 34.0 pg   MCHC 33.4 30.0 - 36.0 g/dL   RDW 12.1 11.5 - 15.5 %   Platelets 225 150 - 400 K/uL   nRBC 0.0 0.0 - 0.2 %   Neutrophils Relative % 82 %   Neutro Abs 6.5 1.7 - 7.7 K/uL   Lymphocytes Relative 10 %   Lymphs Abs 0.8 0.7 -  4.0 K/uL   Monocytes Relative 7 %   Monocytes Absolute 0.6 0.1 - 1.0 K/uL   Eosinophils Relative 1 %   Eosinophils Absolute 0.0 0.0 - 0.5 K/uL   Basophils Relative 0 %   Basophils Absolute 0.0 0.0 - 0.1 K/uL   Immature Granulocytes 0 %   Abs Immature Granulocytes 0.03 0.00 - 0.07 K/uL    Comment: Performed at [facility], [place] [address], [place], [place] [address]  I-stat chem 8, ED (not at [facility] or [facility])     Status: Abnormal   Collection Time: 07/16/19  8:36 PM  Result Value Ref Range   Sodium 140 135 - 145 mmol/L   Potassium 4.3 3.5 - 5.1 mmol/L   Chloride 102 98 - 111 mmol/L   BUN 20 8 - 23 mg/dL   Creatinine, Ser 1.10 0.61 - 1.24 mg/dL   Glucose, Bld 105 (H) 70 - 99 mg/dL   Calcium, Ion 1.16 1.15 - 1.40 mmol/L   TCO2 32 22 - 32 mmol/L   Hemoglobin 14.3 13.0 - 17.0 g/dL   HCT 42.0 39.0 - 52.0 %  Type and screen [place]     Status: None (Preliminary result)   Collection Time: 07/16/19  9:22 PM  Result Value Ref Range   ABO/RH(D) PENDING    Antibody Screen PENDING    Sample  Expiration      07/19/2019,2359 Performed at [facility], [place] [address], [place], [place] [address]   Respiratory Panel by RT PCR (Flu A&B, Covid) - Nasopharyngeal Swab     Status: None   Collection Time: 07/16/19  9:22 PM   Specimen: Nasopharyngeal Swab  Result Value Ref Range   SARS Coronavirus 2 by RT PCR NEGATIVE NEGATIVE    Comment: (NOTE) SARS-CoV-2 target nucleic acids are NOT DETECTED. The SARS-CoV-2 RNA is generally detectable in upper respiratoy specimens during the acute phase of infection. The lowest concentration of SARS-CoV-2 viral copies this assay can detect is 131 copies/mL. A negative result does not preclude SARS-Cov-2 infection and should not be used as the sole basis for treatment or other patient management decisions. A negative result may occur with  improper specimen collection/handling, submission of specimen other than nasopharyngeal swab, presence of viral mutation(s) within the areas targeted by this assay, and inadequate number of viral copies (<131 copies/mL). A negative result must be combined with clinical observations, patient history, and epidemiological information. The expected result is Negative. Fact Sheet for Patients:  PinkCheek.be Fact Sheet for Healthcare Providers:  GravelBags.it This test is not yet ap proved or cleared by the [place] FDA and  has been authorized for detection and/or diagnosis of SARS-CoV-2 by FDA under an Emergency Use Authorization (EUA). This EUA will remain  in effect (meaning this test can be used) for the duration of the COVID-19 declaration under Section 564(b)(1) of the Act, 21 U.S.C. section 360bbb-3(b)(1), unless the authorization is terminated or revoked sooner.    Influenza A by PCR NEGATIVE NEGATIVE   Influenza B by PCR NEGATIVE NEGATIVE    Comment: (NOTE) The Xpert Xpress SARS-CoV-2/FLU/RSV assay is intended as an aid in    the diagnosis of influenza from Nasopharyngeal swab specimens and  should not be used as a sole basis for treatment. Nasal washings and  aspirates are unacceptable for Xpert Xpress SARS-CoV-2/FLU/RSV  testing. Fact Sheet for Patients: PinkCheek.be Fact Sheet for Healthcare Providers: GravelBags.it This test is not yet approved or cleared by the [place]  States FDA and  has been authorized for detection and/or diagnosis of SARS-CoV-2 by  FDA under an Emergency Use Authorization (EUA). This EUA will remain  in effect (meaning this test can be used) for the duration of the  Covid-19 declaration under Section 564(b)(1) of the Act, 21  U.S.C. section 360bbb-3(b)(1), unless the authorization is  terminated or revoked. Performed at [facility], [place] [address], [place], [place] [address]     CT Chest W Contrast  Result Date: 07/16/2019 CLINICAL DATA:  Left-sided chest pain and shortness of breath. Kicked by horse 2 days ago EXAM: CT CHEST WITH CONTRAST TECHNIQUE: Multidetector CT imaging of the chest was performed during intravenous contrast administration. CONTRAST:  56mL OMNIPAQUE IOHEXOL 300 MG/ML  SOLN COMPARISON:  None. FINDINGS: Cardiovascular: Normal heart size. No pericardial effusion. Thoracic aorta is normal in caliber. Pulmonary arteries are nondilated. Mediastinum/Nodes: Extensive pneumomediastinum dissecting into the base of the neck, more pronounced on the left. No significant shift of the mediastinal structures. No axillary, mediastinal, or hilar lymphadenopathy. Moderate-sized hiatal hernia. Lungs/Pleura: Small to moderate-sized left-sided pneumothorax. Small amount of layering fluid in the left pleural space. Left lower lobe and lingular atelectasis. Right lung is clear. No right-sided pleural effusion. Upper Abdomen: No acute abnormality. Musculoskeletal: Nondisplaced anterolateral left third rib fracture  (series 3, image 67). Mildly displaced lateral left fourth rib fracture (series 3, image 84) with additional nondisplaced component slightly more anteriorly (series 3, image 93). Nondisplaced lateral left fifth rib fracture (series 3, image 94). There is extensive subcutaneous emphysema overlying the left chest wall. IMPRESSION: 1. Small to moderate-sized left-sided pneumothorax. Small amount of dependent fluid within the pleural space suggesting a component of hemopneumothorax. No shift of mediastinal structures. 2. Extensive pneumomediastinum dissecting into the base of the neck, more pronounced on the left. 3. Acute left-sided third, fourth, and fifth rib fractures with extensive subcutaneous emphysema throughout the left chest wall. 4. Left lower lobe and lingular atelectasis. 5. Moderate-sized hiatal hernia. These results were called by telephone at the time of interpretation on 07/16/2019 at 9:23 pm to provider [facility] , who verbally acknowledged these results. Electronically Signed   By: Davina Poke D.O.   On: 07/16/2019 21:25    ROS 10 point review of systems is negative except as listed above in HPI.  Blood pressure 122/74, pulse 73, temperature 99 F (37.2 C), temperature source Oral, resp. rate 13, SpO2 95 %.  Secondary Survey:  GCS: E(4)//V(5)//M(6) Skull: normocephalic, atraumatic Eyes: PEERL, 26mm b/l Face: midface stable without deformity Oropharynx: no blood, no malocclusion, no TMJ instability, no TTP of TMJ or mandible Neck: trachea midline, no midline cervical TTP Chest: BS equal b/l, + L lateral chest wall TTP Abdomen: soft, NT, no bruising FAST: not performed Pelvis: stable GU: no blood at meatus Back: no wounds, no T/L spine TTP, no stepoffs Rectal: deferred Extremities: 2+ radial and DP b/l, motor and sensation intact to b/l UE and LE    Assessment/Plan: Problem List 87M s/p fall onto railing  Plan L PTX - L chest tube placement, to suction, repeat CXR in  AM. Pain control, IS, pulm toilet Jaw pain with extreme deviation of jaw to right - patient declines CT max/face FEN - reg diet DVT - SCDs, LMWH Home meds - resume Dispo - Admit to inpatient--floor   Jesusita Oka, MD General and [facility]

## 2019-07-16 NOTE — ED Triage Notes (Signed)
Pt trx from [facility] for Pneumothorax. VS stable, NAD, pt A&Ox4

## 2019-07-16 NOTE — ED Notes (Signed)
[facility] contacted and paperwork printed  

## 2019-07-17 ENCOUNTER — Inpatient Hospital Stay (HOSPITAL_COMMUNITY): Payer: Medicare Other

## 2019-07-17 ENCOUNTER — Other Ambulatory Visit: Payer: Self-pay

## 2019-07-17 ENCOUNTER — Encounter (HOSPITAL_COMMUNITY): Payer: Self-pay

## 2019-07-17 LAB — BASIC METABOLIC PANEL
Anion gap: 6 (ref 5–15)
BUN: 13 mg/dL (ref 8–23)
CO2: 27 mmol/L (ref 22–32)
Calcium: 8.6 mg/dL — ABNORMAL LOW (ref 8.9–10.3)
Chloride: 105 mmol/L (ref 98–111)
Creatinine, Ser: 1.01 mg/dL (ref 0.61–1.24)
GFR calc Af Amer: >60 mL/min (ref >60)
GFR calc non Af Amer: >60 mL/min (ref >60)
Glucose, Bld: 134 mg/dL — ABNORMAL HIGH (ref 70–99)
Potassium: 4.4 mmol/L (ref 3.5–5.1)
Sodium: 138 mmol/L (ref 135–145)

## 2019-07-17 LAB — CBC
HCT: 40.1 % (ref 39.0–52.0)
Hemoglobin: 13.4 g/dL (ref 13.0–17.0)
MCH: 31.6 pg (ref 26.0–34.0)
MCHC: 33.4 g/dL (ref 30.0–36.0)
MCV: 94.6 fL (ref 80.0–100.0)
Platelets: 209 10*3/uL (ref 150–400)
RBC: 4.24 MIL/uL (ref 4.22–5.81)
RDW: 12.2 % (ref 11.5–15.5)
WBC: 8 10*3/uL (ref 4.0–10.5)
nRBC: 0 % (ref 0.0–0.2)

## 2019-07-17 LAB — HIV ANTIBODY (ROUTINE TESTING W REFLEX): HIV Screen 4th Generation wRfx: NONREACTIVE

## 2019-07-17 MED ORDER — OXYCODONE HCL 5 MG PO TABS
5.0000 mg | ORAL_TABLET | ORAL | Status: DC | PRN
  Administered 2019-07-17 – 2019-07-18 (×6): 10 mg via ORAL
  Filled 2019-07-17 (×6): qty 2

## 2019-07-17 MED ORDER — MORPHINE SULFATE (PF) 2 MG/ML IV SOLN: 1.0000 mg | INTRAVENOUS | Status: DC | PRN

## 2019-07-17 MED ORDER — FAMOTIDINE 20 MG PO TABS
20.0000 mg | ORAL_TABLET | Freq: Two times a day (BID) | ORAL | Status: DC
  Administered 2019-07-17 – 2019-07-19 (×4): 20 mg via ORAL
  Filled 2019-07-17 (×4): qty 1

## 2019-07-17 MED ORDER — POLYETHYLENE GLYCOL 3350 17 G PO PACK
17.0000 g | PACK | Freq: Every day | ORAL | Status: DC
  Administered 2019-07-17: 17 g via ORAL
  Filled 2019-07-17: qty 1

## 2019-07-17 MED ORDER — METHOCARBAMOL 500 MG PO TABS
500.0000 mg | ORAL_TABLET | Freq: Four times a day (QID) | ORAL | Status: DC | PRN
  Administered 2019-07-17 (×2): 500 mg via ORAL
  Filled 2019-07-17 (×2): qty 1

## 2019-07-17 MED ORDER — PNEUMOCOCCAL VAC POLYVALENT 25 MCG/0.5ML IJ INJ
0.5000 mL | INJECTION | INTRAMUSCULAR | Status: AC
  Administered 2019-07-18: 0.5 mL via INTRAMUSCULAR
  Filled 2019-07-17: qty 0.5

## 2019-07-17 MED ORDER — FAMOTIDINE IN NACL 20-0.9 MG/50ML-% IV SOLN: 20.0000 mg | Freq: Two times a day (BID) | INTRAVENOUS | Status: DC

## 2019-07-17 MED ORDER — ACETAMINOPHEN 500 MG PO TABS
1000.0000 mg | ORAL_TABLET | Freq: Four times a day (QID) | ORAL | Status: DC
  Administered 2019-07-17 (×3): 1000 mg via ORAL
  Administered 2019-07-18: 500 mg via ORAL
  Filled 2019-07-17 (×4): qty 2

## 2019-07-17 NOTE — Progress Notes (Addendum)
Patient ID: Nathan Berg, male   DOB: [birth year], 64 y.o.   MRN: [id number]       Subjective: Having major pain control issues.  Feels slightly short of breath but on room air around 94%.  Voiding well.  Says there is a difference with deviation of his jaw from one side to the other, but still does not want further work up of this.  ROS: See above, otherwise other systems negative  Objective: Vital signs in last 24 hours: Temp:  [99 F (37.2 C)-99.9 F (37.7 C)] 99.1 F (37.3 C) (01/12 0533) Pulse Rate:  [71-78] 75 (01/12 0533) Resp:  [12-23] 16 (01/12 0533) BP: (113-133)/(68-92) 118/77 (01/12 0533) SpO2:  [93 %-97 %] 95 % (01/12 0533) Weight:  [81.6 kg] 81.6 kg (01/12 0136) Last BM Date: 07/16/19  Intake/Output from previous day: 01/11 0701 - 01/12 0700 In: 468.1 [P.O.:300; I.V.:168.1] Out: 285 [Urine:275; Chest Tube:10] Intake/Output this shift: No intake/output data recorded.  PE: Gen: NAD, laying in bed Heart: regular Lungs/chest: crepitus and edema of chest wall consistent with extensive SQ air.  CTAB, tender on left side.  CT in place with no airleak and no significant output. Abd: soft, NT, ND, +BS Ext: MAE, NVI  Lab Results:  Recent Labs    07/16/19 2026 07/16/19 2036 07/17/19 0322  WBC 8.0  --  8.0  HGB 14.5 14.3 13.4  HCT 43.4 42.0 40.1  PLT 225  --  209   BMET Recent Labs    07/16/19 2026 07/16/19 2036 07/17/19 0322  NA 141 140 138  K 4.3 4.3 4.4  CL 105 102 105  CO2 30  --  27  GLUCOSE 112* 105* 134*  BUN 20 20 13   CREATININE 1.13 1.10 1.01  CALCIUM 8.8*  --  8.6*   PT/INR No results for input(s): LABPROT, INR in the last 72 hours. CMP     Component Value Date/Time   NA 138 07/17/2019 0322   K 4.4 07/17/2019 0322   CL 105 07/17/2019 0322   CO2 27 07/17/2019 0322   GLUCOSE 134 (H) 07/17/2019 0322   BUN 13 07/17/2019 0322   CREATININE 1.01 07/17/2019 0322   CALCIUM 8.6 (L) 07/17/2019 0322   GFRNONAA >60 07/17/2019 0322   GFRAA >60  07/17/2019 0322   Lipase  No results found for: LIPASE     Studies/Results: CT Chest W Contrast  Result Date: 07/16/2019 CLINICAL DATA:  Left-sided chest pain and shortness of breath. Kicked by horse 2 days ago EXAM: CT CHEST WITH CONTRAST TECHNIQUE: Multidetector CT imaging of the chest was performed during intravenous contrast administration. CONTRAST:  30mL OMNIPAQUE IOHEXOL 300 MG/ML  SOLN COMPARISON:  None. FINDINGS: Cardiovascular: Normal heart size. No pericardial effusion. Thoracic aorta is normal in caliber. Pulmonary arteries are nondilated. Mediastinum/Nodes: Extensive pneumomediastinum dissecting into the base of the neck, more pronounced on the left. No significant shift of the mediastinal structures. No axillary, mediastinal, or hilar lymphadenopathy. Moderate-sized hiatal hernia. Lungs/Pleura: Small to moderate-sized left-sided pneumothorax. Small amount of layering fluid in the left pleural space. Left lower lobe and lingular atelectasis. Right lung is clear. No right-sided pleural effusion. Upper Abdomen: No acute abnormality. Musculoskeletal: Nondisplaced anterolateral left third rib fracture (series 3, image 67). Mildly displaced lateral left fourth rib fracture (series 3, image 84) with additional nondisplaced component slightly more anteriorly (series 3, image 93). Nondisplaced lateral left fifth rib fracture (series 3, image 94). There is extensive subcutaneous emphysema overlying the left chest wall. IMPRESSION:  1. Small to moderate-sized left-sided pneumothorax. Small amount of dependent fluid within the pleural space suggesting a component of hemopneumothorax. No shift of mediastinal structures. 2. Extensive pneumomediastinum dissecting into the base of the neck, more pronounced on the left. 3. Acute left-sided third, fourth, and fifth rib fractures with extensive subcutaneous emphysema throughout the left chest wall. 4. Left lower lobe and lingular atelectasis. 5. Moderate-sized  hiatal hernia. These results were called by telephone at the time of interpretation on 07/16/2019 at 9:23 pm to provider [facility] , who verbally acknowledged these results. Electronically Signed   By: Davina Poke D.O.   On: 07/16/2019 21:25   DG Chest Portable 1 View  Result Date: 07/17/2019 CLINICAL DATA:  Chest tube placement EXAM: PORTABLE CHEST 1 VIEW COMPARISON:  July 17, 2019 FINDINGS: The heart size and mediastinal contours are unchanged. Again noted is a left-sided apical pigtail catheter. No residual left apical pneumothorax is seen. The right lung is clear. Extensive overlying subcutaneous emphysema seen over the left hemithorax. IMPRESSION: Interval resolution of left apical pneumothorax. Electronically Signed   By: Prudencio Pair M.D.   On: 07/17/2019 05:52   DG Chest Port 1 View  Result Date: 07/17/2019 CLINICAL DATA:  Left-sided chest tube placement EXAM: PORTABLE CHEST 1 VIEW COMPARISON:  CT chest same day FINDINGS: The cardiomediastinal silhouette is unchanged. There is been interval placement of a left-sided apical chest tube. There remains a tiny small apical pneumothorax. Extensive overlying the mediastinum is seen tracking into the neck. No mediastinal shift. The right lung remains clear. 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 IMPRESSION: Interval placement of left pigtail catheter with a residual tiny left apical pneumothorax. Extensive overlying the mediastinum. Electronically Signed   By: Prudencio Pair M.D.   On: 07/17/2019 00:24    Anti-infectives: Anti-infectives (From admission, onward)   None       Assessment/Plan fall onto railing L PTX with Left 3-5 rib fx - L chest tube placement, to suction, repeat CXR in AM. Pain control, IS, pulm toilet, PT/OT eval for mobilization Jaw pain with deviation of jaw to right - patient declines CT max/face H/O prostate cancer - voiding well currently FEN - reg diet, miralax, colace DVT - SCDs, LMWH Home meds -  resume Dispo - floor, therapies, await chest tube treatment, pain control   LOS: 1 day    Henreitta Cea , [facility] 07/17/2019, 7:49 AM Please see Amion for pager number during day hours 7:00am-4:30pm or 7:00am -11:30am on weekends

## 2019-07-17 NOTE — Progress Notes (Signed)
Spoke with Nathan Berg briefly. Chest tube to left chest to 20 cm suction. Subcutaneous air noted to left upper anterior chest and shoulder - stops where neck meets shoulder. Instructed Nathan Berg to call the nurse if the subcutaneous air expands to his neck and/or face. No bubbling seen in airleak detection chamber during breathing or coughing. Serosanguinous drainage in chest tube and drainage system. Provided instruction on use of IS, hold deep breath in for 5-10 seconds and exhale through pursed lips. He asked about if he needs to go to the bathroom. I said that he should call the nurse because he will need to be disconnected correctly and safely and reconnected when he returns from the [facility].
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
## 2019-07-17 NOTE — ED Notes (Signed)
XRAY at bedside for chest tube placement verification

## 2019-07-17 NOTE — Evaluation (Signed)
Physical Therapy Evaluation Patient Details Name: Nathan Berg MRN: [id number] DOB: Nov 13, 1955 Today's Date: 07/17/2019   History of Present Illness  Patient is a 64 y/o male admitted after getting hit by a horse into a railing falling on L side with rib fx 3-5 on L and L PTX.  PMH positive for prostate CA, kidney stones, GERD and arthritis.  Clinical Impression  Patient presents with mobility limited due to L sided pain.  Up ambulating he does quite well, but admits that main difficulty is bed mobility.  Will follow acutely to progress bed mobility and review steps prior to d/c.     Follow Up Recommendations No PT follow up    Equipment Recommendations  None recommended by PT    Recommendations for Other Services       Precautions / Restrictions Precautions Precautions: Other (comment) Precaution Comments: L chest tube      Mobility  Bed Mobility Overal bed mobility: Needs Assistance Bed Mobility: Supine to Sit     Supine to sit: HOB elevated;Supervision     General bed mobility comments: increased time, use of railing and HOB about 70 degrees, educated in using pillow to splint on L side if lying down in flat bed, but did not practice  Transfers Overall transfer level: Modified independent                  Ambulation/Gait Ambulation/Gait assistance: Supervision Gait Distance (Feet): 400 Feet Assistive device: None Gait Pattern/deviations: Step-through pattern     General Gait Details: good stride length and speed, assist for IV and chest tube, SpO2 94% after ambulation on RA, was 92% at rest  Stairs            Wheelchair Mobility    Modified Rankin (Stroke Patients Only)       Balance Overall balance assessment: No apparent balance deficits (not formally assessed)                                           Pertinent Vitals/Pain Pain Assessment: 0-10 Pain Score: 7  Pain Location: back Pain Descriptors / Indicators:  Aching;Sharp Pain Intervention(s): Monitored during session;Repositioned    Home Living Family/patient expects to be discharged to:: Private residence Living Arrangements: Spouse/significant other Available Help at Discharge: Family;Available 24 hours/day Type of Home: House Home Access: Stairs to enter Entrance Stairs-Rails: Chemical engineer of Steps: 4 Home Layout: Laundry or work area in [employer] Equipment: Civil engineer, contracting - built in;Walker - 2 wheels;Cane - single point      Prior Function Level of Independence: Independent               Hand Dominance        Extremity/Trunk Assessment   Upper Extremity Assessment Upper Extremity Assessment: LUE deficits/detail LUE Deficits / Details: reports pain upon elevation of shoulder even prior to incident due to "bone on bone" arthrits, able to grip with hand, but AROM NT due to pain    Lower Extremity Assessment Lower Extremity Assessment: Overall WFL for tasks assessed       Communication   Communication: No difficulties  Cognition Arousal/Alertness: Awake/alert Behavior During Therapy: WFL for tasks assessed/performed Overall Cognitive Status: Within Functional Limits for tasks assessed  General Comments      Exercises     Assessment/Plan    PT Assessment    PT Problem List         PT Treatment Interventions      PT Goals (Current goals can be found in the Care Plan section)  Acute Rehab PT Goals Patient Stated Goal: to return to independent PT Goal Formulation: With patient Time For Goal Achievement: 07/21/19 Potential to Achieve Goals: Good    Frequency     Barriers to discharge        Co-evaluation               AM-PAC PT "6 Clicks" Mobility  Outcome Measure Help needed turning from your back to your side while in a flat bed without using bedrails?: A Little Help needed moving from lying on your  back to sitting on the side of a flat bed without using bedrails?: A Little Help needed moving to and from a bed to a chair (including a wheelchair)?: None Help needed standing up from a chair using your arms (e.g., wheelchair or bedside chair)?: None Help needed to walk in hospital room?: None Help needed climbing 3-5 steps with a railing? : A Little 6 Click Score: 21    End of Session   Activity Tolerance: Patient tolerated treatment well Patient left: in chair;with call bell/phone within reach Nurse Communication: Mobility status PT Visit Diagnosis: Difficulty in walking, not elsewhere classified (R26.2)    Time: 1015-1040 PT Time Calculation (min) (ACUTE ONLY): 25 min   Charges:   PT Evaluation $PT Eval Low Complexity: 1 Low PT Treatments $Gait Training: 8-22 mins        Magda Kiel, [place] [phone] 07/17/2019   Reginia Naas 07/17/2019, 11:12 AM

## 2019-07-18 ENCOUNTER — Inpatient Hospital Stay (HOSPITAL_COMMUNITY): Payer: Medicare Other

## 2019-07-18 NOTE — Evaluation (Signed)
Occupational Therapy Evaluation Patient Details Name: Nathan Berg MRN: [id number] DOB: 1956-01-22 Today's Date: 07/18/2019    History of Present Illness Patient is a 64 y/o male admitted after getting hit by a horse into a railing falling on L side with rib fx 3-5 on L and L PTX.  PMH positive for prostate CA, kidney stones, GERD and arthritis.   Clinical Impression   Pt is at Mod I level with ADLs/selfcare and mobility using no AD. Pt reports 3/10 pain and is eager to go home where his wife can assist prn. All education completed and no further acute OT is indicated at this time    Follow Up Recommendations  No OT follow up    Equipment Recommendations  Other (comment)(reacher)    Recommendations for Other Services       Precautions / Restrictions Precautions Precautions: Other (comment) Precaution Comments: L chest tube Restrictions Weight Bearing Restrictions: No      Mobility Bed Mobility               General bed mobility comments: pt in recliner upon arrival  Transfers Overall transfer level: Modified independent                    Balance Overall balance assessment: No apparent balance deficits (not formally assessed)                                         ADL either performed or assessed with clinical judgement   ADL Overall ADL's : Modified independent                                       General ADL Comments: pt instructed to get assist with ADLs/selfcare from his wife at home if havng any difficulty or increased pain     Vision Baseline Vision/History: Wears glasses Wears Glasses: Reading only Patient Visual Report: No change from baseline       Perception     Praxis      Pertinent Vitals/Pain Pain Assessment: 0-10 Pain Score: 3  Faces Pain Scale: Hurts a little bit Pain Location: back Pain Descriptors / Indicators: Sore Pain Intervention(s): Monitored during session     Hand Dominance  Right   Extremity/Trunk Assessment Upper Extremity Assessment Upper Extremity Assessment: Overall WFL for tasks assessed   Lower Extremity Assessment Lower Extremity Assessment: Defer to PT evaluation   Cervical / Trunk Assessment Cervical / Trunk Assessment: Normal   Communication Communication Communication: No difficulties   Cognition Arousal/Alertness: Awake/alert Behavior During Therapy: WFL for tasks assessed/performed Overall Cognitive Status: Within Functional Limits for tasks assessed                                     General Comments       Exercises     Shoulder Instructions      Home Living Family/patient expects to be discharged to:: Private residence Living Arrangements: Spouse/significant other Available Help at Discharge: Family;Available 24 hours/day Type of Home: House Home Access: Stairs to enter [employer] of Steps: 4 Entrance Stairs-Rails: Left;Right Home Layout: Laundry or work area in Biomedical engineer of Steps: couple steps down into living room, etc, no rails  Bathroom Shower/Tub: Chief Strategy Officer: Shower seat - built in;Walker - 2 wheels;Cane - single point          Prior Functioning/Environment Level of Independence: Independent                 OT Problem List: Pain;Decreased activity tolerance      OT Treatment/Interventions:      OT Goals(Current goals can be found in the care plan section) Acute Rehab OT Goals Patient Stated Goal: to return to independent OT Goal Formulation: With patient  OT Frequency:     Barriers to D/C:            Co-evaluation              AM-PAC OT "6 Clicks" Daily Activity     Outcome Measure Help from another person eating meals?: None Help from another person taking care of personal grooming?: None Help from another person toileting, which includes using toliet, bedpan, or urinal?: None Help from  another person bathing (including washing, rinsing, drying)?: None Help from another person to put on and taking off regular upper body clothing?: None Help from another person to put on and taking off regular lower body clothing?: None 6 Click Score: 24   End of Session    Activity Tolerance: Patient tolerated treatment well Patient left: Other (comment)(standing in room with PT)  OT Visit Diagnosis: Pain;Other abnormalities of gait and mobility (R26.89) Pain - Right/Left: Left Pain - part of body: (rib area)                Time: [id number] OT Time Calculation (min): 19 min Charges:  OT General Charges $OT Visit: 1 Visit OT Evaluation $OT Eval Low Complexity: 1 Low    Britt Bottom 07/18/2019, 12:14 PM

## 2019-07-18 NOTE — Progress Notes (Signed)
Patient ID: Nathan Berg, male   DOB: [birth year], 64 y.o.   MRN: [id number]       Subjective: Pain is better controlled.  Has most of his pain with mobilization.  Voiding well and eating well.  No SOB.  ROS: See above, otherwise other systems negative  Objective: Vital signs in last 24 hours: Temp:  [98 F (36.7 C)-99 F (37.2 C)] 98.1 F (36.7 C) (01/13 0546) Pulse Rate:  [59-71] 59 (01/13 0546) Resp:  [18] 18 (01/13 0546) BP: (103-133)/(69-79) 133/79 (01/13 0546) SpO2:  [94 %-99 %] 97 % (01/13 0546) Last BM Date: 07/16/19  Intake/Output from previous day: 01/12 0701 - 01/13 0700 In: 2131.4 [P.O.:900; I.V.:1231.4] Out: [id number] [Urine:1750; Chest Tube:84] Intake/Output this shift: No intake/output data recorded.  PE: Gen: sitting on the side of his bed in NAD Heart: regular Lungs: CTAB, still with crepitus throughout his chest up towards his neck on the left side.  Tender on left side of chest as expected.  CT in place with no airleak and no output. Abd: soft, NT, ND, +BS Ext: MAE, NVI  Lab Results:  Recent Labs    07/16/19 2026 07/16/19 2036 07/17/19 0322  WBC 8.0  --  8.0  HGB 14.5 14.3 13.4  HCT 43.4 42.0 40.1  PLT 225  --  209   BMET Recent Labs    07/16/19 2026 07/16/19 2036 07/17/19 0322  NA 141 140 138  K 4.3 4.3 4.4  CL 105 102 105  CO2 30  --  27  GLUCOSE 112* 105* 134*  BUN 20 20 13   CREATININE 1.13 1.10 1.01  CALCIUM 8.8*  --  8.6*   PT/INR No results for input(s): LABPROT, INR in the last 72 hours. CMP     Component Value Date/Time   NA 138 07/17/2019 0322   K 4.4 07/17/2019 0322   CL 105 07/17/2019 0322   CO2 27 07/17/2019 0322   GLUCOSE 134 (H) 07/17/2019 0322   BUN 13 07/17/2019 0322   CREATININE 1.01 07/17/2019 0322   CALCIUM 8.6 (L) 07/17/2019 0322   GFRNONAA >60 07/17/2019 0322   GFRAA >60 07/17/2019 0322   Lipase  No results found for: LIPASE     Studies/Results: CT Chest W Contrast  Result Date: 07/16/2019 CLINICAL  DATA:  Left-sided chest pain and shortness of breath. Kicked by horse 2 days ago EXAM: CT CHEST WITH CONTRAST TECHNIQUE: Multidetector CT imaging of the chest was performed during intravenous contrast administration. CONTRAST:  69mL OMNIPAQUE IOHEXOL 300 MG/ML  SOLN COMPARISON:  None. FINDINGS: Cardiovascular: Normal heart size. No pericardial effusion. Thoracic aorta is normal in caliber. Pulmonary arteries are nondilated. Mediastinum/Nodes: Extensive pneumomediastinum dissecting into the base of the neck, more pronounced on the left. No significant shift of the mediastinal structures. No axillary, mediastinal, or hilar lymphadenopathy. Moderate-sized hiatal hernia. Lungs/Pleura: Small to moderate-sized left-sided pneumothorax. Small amount of layering fluid in the left pleural space. Left lower lobe and lingular atelectasis. Right lung is clear. No right-sided pleural effusion. Upper Abdomen: No acute abnormality. Musculoskeletal: Nondisplaced anterolateral left third rib fracture (series 3, image 67). Mildly displaced lateral left fourth rib fracture (series 3, image 84) with additional nondisplaced component slightly more anteriorly (series 3, image 93). Nondisplaced lateral left fifth rib fracture (series 3, image 94). There is extensive subcutaneous emphysema overlying the left chest wall. IMPRESSION: 1. Small to moderate-sized left-sided pneumothorax. Small amount of dependent fluid within the pleural space suggesting a component of hemopneumothorax. No shift of  mediastinal structures. 2. Extensive pneumomediastinum dissecting into the base of the neck, more pronounced on the left. 3. Acute left-sided third, fourth, and fifth rib fractures with extensive subcutaneous emphysema throughout the left chest wall. 4. Left lower lobe and lingular atelectasis. 5. Moderate-sized hiatal hernia. These results were called by telephone at the time of interpretation on 07/16/2019 at 9:23 pm to provider [facility] , who  verbally acknowledged these results. Electronically Signed   By: Davina Poke D.O.   On: 07/16/2019 21:25   DG CHEST PORT 1 VIEW  Result Date: 07/18/2019 CLINICAL DATA:  Left pneumothorax. EXAM: PORTABLE CHEST 1 VIEW COMPARISON:  July 17, 2019. FINDINGS: Stable cardiomediastinal silhouette. Right lung is clear. Left-sided chest tube is noted which appears to have been partially withdrawn. Stable subcutaneous emphysema is seen over the left chest and supraclavicular regions. No definite pneumothorax is noted, although this may be obscured due to overlying subcutaneous emphysema. Bony thorax is unremarkable. Minimal bibasilar subsegmental atelectasis is noted. IMPRESSION: Left-sided chest tube appears to have been partially withdrawn. Stable subcutaneous emphysema is seen over the left chest and supraclavicular regions. No definite pneumothorax is noted, although this may be obscured due to overlying subcutaneous emphysema. Electronically Signed   By: Marijo Conception M.D.   On: 07/18/2019 07:33   DG Chest Portable 1 View  Result Date: 07/17/2019 CLINICAL DATA:  Chest tube placement EXAM: PORTABLE CHEST 1 VIEW COMPARISON:  July 17, 2019 FINDINGS: The heart size and mediastinal contours are unchanged. Again noted is a left-sided apical pigtail catheter. No residual left apical pneumothorax is seen. The right lung is clear. Extensive overlying subcutaneous emphysema seen over the left hemithorax. IMPRESSION: Interval resolution of left apical pneumothorax. Electronically Signed   By: Prudencio Pair M.D.   On: 07/17/2019 05:52   DG Chest Port 1 View  Result Date: 07/17/2019 CLINICAL DATA:  Left-sided chest tube placement EXAM: PORTABLE CHEST 1 VIEW COMPARISON:  CT chest same day FINDINGS: The cardiomediastinal silhouette is unchanged. There is been interval placement of a left-sided apical chest tube. There remains a tiny small apical pneumothorax. Extensive overlying the mediastinum is seen tracking  into the neck. No mediastinal shift. The right lung remains clear. 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 IMPRESSION: Interval placement of left pigtail catheter with a residual tiny left apical pneumothorax. Extensive overlying the mediastinum. Electronically Signed   By: Prudencio Pair M.D.   On: 07/17/2019 00:24    Anti-infectives: Anti-infectives (From admission, onward)   None       Assessment/Plan fall onto railing L PTX with Left 3-5 rib fx- L chest tube to waterseal today, repeat CXR in AM. Pain control, IS, pulm toilet, no therapy needs Jaw pain with deviation of jaw to right- patient declines CT max/face H/O prostate cancer - voiding well currently FEN- reg diet, miralax, colace DVT - SCDs,LMWH Home meds-resume Dispo- floor, CXR in am, if ok, then plan to DC CT tomorrow and patient possibly after that   LOS: 2 days    Henreitta Cea , [facility] 07/18/2019, 8:19 AM Please see Amion for pager number during day hours 7:00am-4:30pm or 7:00am -11:30am on weekends

## 2019-07-18 NOTE — Progress Notes (Addendum)
Physical Therapy Treatment Patient Details Name: Nathan Berg MRN: [id number] DOB: 1956/01/08 Today's Date: 07/18/2019    History of Present Illness Patient is a 64 y/o male admitted after getting hit by a horse into a railing falling on L side with rib fx 3-5 on L and L PTX.  PMH positive for prostate CA, kidney stones, GERD and arthritis.    PT Comments    Pt making steady progress with functional mobility. He tolerated ambulation in hallway and stair training with supervision overall for safety. Pt with no LOB throughout or need for any physical assistance. Pt on RA throughout with SPO2 maintaining at 95%. Pt with no SOB reporting throughout. PT will continue to follow acutely to progress mobility as tolerated to ensure a safe d/c home.     Follow Up Recommendations  No PT follow up     Equipment Recommendations  None recommended by PT    Recommendations for Other Services       Precautions / Restrictions Precautions Precautions: Other (comment) Precaution Comments: L chest tube Restrictions Weight Bearing Restrictions: No    Mobility  Bed Mobility               General bed mobility comments: pt OOB standing in room upon arrival  Transfers Overall transfer level: Modified independent                  Ambulation/Gait Ambulation/Gait assistance: Supervision Gait Distance (Feet): 500 Feet Assistive device: None Gait Pattern/deviations: Step-through pattern Gait velocity: able to fluctuate   General Gait Details: no instability or LOB, pt with appropriate gait speed and able to navigate safely around obstacles in hallway   Stairs Stairs: Yes Stairs assistance: Supervision Stair Management: No rails;One rail Left;Alternating pattern;Step to pattern;Forwards Number of Stairs: 10 General stair comments: pt ascending with alternating step pattern without use of rail. When descending, pt using L hand rail and using a step-to pattern   Wheelchair  Mobility    Modified Rankin (Stroke Patients Only)       Balance Overall balance assessment: No apparent balance deficits (not formally assessed)                                          Cognition Arousal/Alertness: Awake/alert Behavior During Therapy: WFL for tasks assessed/performed Overall Cognitive Status: Within Functional Limits for tasks assessed                                        Exercises      General Comments        Pertinent Vitals/Pain Pain Assessment: Faces Faces Pain Scale: Hurts a little bit Pain Location: back Pain Descriptors / Indicators: Sore Pain Intervention(s): Monitored during session;Repositioned    Home Living                      Prior Function            PT Goals (current goals can now be found in the care plan section) Acute Rehab PT Goals PT Goal Formulation: With patient Time For Goal Achievement: 07/21/19 Potential to Achieve Goals: Good Progress towards PT goals: Progressing toward goals    Frequency    Min 3X/week      PT Plan Current plan remains appropriate  Co-evaluation              AM-PAC PT "6 Clicks" Mobility   Outcome Measure  Help needed turning from your back to your side while in a flat bed without using bedrails?: None Help needed moving from lying on your back to sitting on the side of a flat bed without using bedrails?: None Help needed moving to and from a bed to a chair (including a wheelchair)?: None Help needed standing up from a chair using your arms (e.g., wheelchair or bedside chair)?: None Help needed to walk in hospital room?: None Help needed climbing 3-5 steps with a railing? : None 6 Click Score: 24    End of Session   Activity Tolerance: Patient tolerated treatment well Patient left: in chair;with call bell/phone within reach Nurse Communication: Mobility status PT Visit Diagnosis: Difficulty in walking, not elsewhere classified  (R26.2)     Time: [id number] PT Time Calculation (min) (ACUTE ONLY): 12 min  Charges:  $Gait Training: 8-22 mins                     Anastasio Champion, DPT  Acute Rehabilitation Services Pager [phone] Office [place] 07/18/2019, 10:52 AM

## 2019-07-19 ENCOUNTER — Inpatient Hospital Stay (HOSPITAL_COMMUNITY): Payer: Medicare Other

## 2019-07-19 MED ORDER — ACETAMINOPHEN 500 MG PO TABS: 1000.0000 mg | ORAL_TABLET | Freq: Four times a day (QID) | ORAL | 0 refills | Status: DC | PRN

## 2019-07-19 MED ORDER — METHOCARBAMOL 500 MG PO TABS: 500.0000 mg | ORAL_TABLET | Freq: Four times a day (QID) | ORAL | 0 refills | Status: DC | PRN

## 2019-07-19 MED ORDER — OXYCODONE HCL 5 MG PO TABS: 5.0000 mg | ORAL_TABLET | Freq: Four times a day (QID) | ORAL | 0 refills | Status: DC | PRN

## 2019-07-19 NOTE — Discharge Summary (Signed)
    Patient ID: Nathan Berg [id number] [birth year] 64 y.o.  Admit date: 07/16/2019 Discharge date: 07/19/2019  Admitting Diagnosis: Fall after hit by horse Left rib fracture 3-5 with PTX Jaw pain  Discharge Diagnosis Patient Active Problem List   Diagnosis Date Noted  . Pneumothorax, traumatic 07/16/2019  . Malignant neoplasm of prostate (Nathan Berg) 09/19/2013  Fall after hit by horse Left rib fracture 3-5 with PTX Jaw pain  Consultants none  Reason for Admission: 31M s/p being knocked by the tail end of a horse and landing on his left side onto a railing. He reports hitting both his chest and face. Denies LOC.   Procedures Left chest tube placement, 07/16/19  Hospital Course:  The patient was admitted after chest tube placement.  His PTX resolved on suction and was able to be watersealed with continued resolution of his PTX.  This was ultimately able to be removed on HD 3.  Follow up CXR revealed continued resolution of his PTX.  He had good pain control with multi-modal medications for his rib fractures.  No therapy follow up was recommended.  He was otherwise felt stable for DC home at this time.    Physical Exam: Gen: NAD, sitting up in his chair Heart: regular Lungs: CTAB, CT removed with no issues and occlusive dressing was applied.  Tender in left chest wall as expected Abd: soft, NT Ext: MAE. NVI  Allergies as of 07/19/2019      Reactions   Codeine Nausea And Vomiting      Medication List    STOP taking these medications   diphenhydramine-acetaminophen 25-500 MG Tabs tablet Commonly known as: TYLENOL PM   traMADol 50 MG tablet Commonly known as: ULTRAM     TAKE these medications   acetaminophen 500 MG tablet Commonly known as: TYLENOL Take 2 tablets (1,000 mg total) by mouth every 6 (six) hours as needed.   buPROPion 300 MG 24 hr tablet Commonly known as: WELLBUTRIN XL Take 300 mg by mouth 2 (two) times daily.   methocarbamol 500 MG tablet Commonly  known as: ROBAXIN Take 1 tablet (500 mg total) by mouth every 6 (six) hours as needed for muscle spasms.   oxyCODONE 5 MG immediate release tablet Commonly known as: Oxy IR/ROXICODONE Take 1-2 tablets (5-10 mg total) by mouth every 6 (six) hours as needed for moderate pain.   UNKNOWN TO PATIENT Take 1 tablet by mouth daily.        Follow-up Information    [place] Follow up in 2 week(s).   Why: Please go a day before your trauma clinic appointment to have a chest x-ray completed.  an order has already been sent.  you will just need to walk in Contact information: [address] [place] [address] [id number]        [place] Follow up on 08/02/2019.   Why: 10:00am, arrive by 9:30am for paperwork and check in process Contact information: [address] [phone]          Signed: Saverio Danker, [facility] Surgery 07/19/2019, 10:53 AM Please see Amion for pager number during day hours 7:00am-4:30pm

## 2019-07-19 NOTE — Progress Notes (Signed)
Pt discharged to home picked up by wife.  AVS printed and given to pt and reviewed.  Follow up appointments given and reviewed.  Pt CXR stable and pt informed of results.  No questions about follow up self care.

## 2019-07-19 NOTE — Progress Notes (Signed)
Physical Therapy Treatment Patient Details Name: Nathan Berg MRN: [id number] DOB: [birth year] Today's Date: 07/19/2019    History of Present Illness Patient is a 64 y/o male admitted after getting hit by a horse into a railing falling on L side with rib fx 3-5 on L and L PTX.  PMH positive for prostate CA, kidney stones, GERD and arthritis.    PT Comments    Pt making steady progress with functional mobility. Chest tube has since been removed since previous session. 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 Additionally, pt on RA throughout with SPO2 maintaining at mid to high 90's. PT encouraged pt to continue to use IS throughout the day. PT will continue to follow acutely per PT POC.    Follow Up Recommendations  No PT follow up     Equipment Recommendations  None recommended by PT    Recommendations for Other Services       Precautions / Restrictions Restrictions Weight Bearing Restrictions: No    Mobility  Bed Mobility               General bed mobility comments: pt OOB in bathroom upon arrival  Transfers Overall transfer level: Modified independent                  Ambulation/Gait Ambulation/Gait assistance: Supervision Gait Distance (Feet): 500 Feet Assistive device: None Gait Pattern/deviations: Step-through pattern Gait velocity: able to fluctuate   General Gait Details: no instability or LOB, pt with appropriate gait speed and able to navigate safely around obstacles in hallway   Stairs             Wheelchair Mobility    Modified Rankin (Stroke Patients Only)       Balance                                 Standardized Balance Assessment Standardized Balance Assessment : Dynamic Gait Index   Dynamic Gait Index Level Surface: Normal Change in Gait Speed: Normal Gait with Horizontal Head Turns: Normal Gait with  Vertical Head Turns: Normal Gait and Pivot Turn: Normal Step Over Obstacle: Normal Step Around Obstacles: Normal Steps: Normal Total Score: 24      Cognition Arousal/Alertness: Awake/alert Behavior During Therapy: WFL for tasks assessed/performed Overall Cognitive Status: Within Functional Limits for tasks assessed                                        Exercises      General Comments        Pertinent Vitals/Pain Pain Assessment: Faces Faces Pain Scale: Hurts a little bit Pain Location: back Pain Descriptors / Indicators: Sore Pain Intervention(s): Monitored during session;Repositioned    Home Living                      Prior Function            PT Goals (current goals can now be found in the care plan section) Acute Rehab PT Goals PT Goal Formulation: With patient Time For Goal Achievement: 07/21/19 Potential to Achieve Goals: Good Progress towards PT goals: Progressing toward goals    Frequency    Min 3X/week  PT Plan Current plan remains appropriate    Co-evaluation              AM-PAC PT "6 Clicks" Mobility   Outcome Measure  Help needed turning from your back to your side while in a flat bed without using bedrails?: None Help needed moving from lying on your back to sitting on the side of a flat bed without using bedrails?: None Help needed moving to and from a bed to a chair (including a wheelchair)?: None Help needed standing up from a chair using your arms (e.g., wheelchair or bedside chair)?: None Help needed to walk in hospital room?: None Help needed climbing 3-5 steps with a railing? : None 6 Click Score: 24    End of Session   Activity Tolerance: Patient tolerated treatment well Patient left: in chair;with call bell/phone within reach Nurse Communication: Mobility status PT Visit Diagnosis: Difficulty in walking, not elsewhere classified (R26.2)     Time: [id number] PT Time Calculation (min)  (ACUTE ONLY): 11 min  Charges:  $Gait Training: 8-22 mins                     Anastasio Champion, DPT  Acute Rehabilitation Services Pager [phone] Office [place] 07/19/2019, 10:00 AM

## 2019-07-19 NOTE — Discharge Instructions (Signed)
Rib Fracture ° °A rib fracture is a break or crack in one of the bones of the ribs. The ribs are like a cage that goes around your upper chest. A broken or cracked rib is often painful, but most do not cause other problems. Most rib fractures usually heal on their own in 1-3 months. °Follow these instructions at home: °Managing pain, stiffness, and swelling °· If directed, apply ice to the injured area. °? Put ice in a plastic bag. °? Place a towel between your skin and the bag. °? Leave the ice on for 20 minutes, 2-3 times a day. °· Take over-the-counter and prescription medicines only as told by your doctor. °Activity °· Avoid activities that cause pain to the injured area. Protect your injured area. °· Slowly increase activity as told by your doctor. °General instructions °· Do deep breathing as told by your doctor. You may be told to: °? Take deep breaths many times a day. °? Cough many times a day while hugging a pillow. °? Use a device (incentive spirometer) to do deep breathing many times a day. °· Drink enough fluid to keep your pee (urine) clear or pale yellow. °· Do not wear a rib belt or binder. These do not allow you to breathe deeply. °· Keep all follow-up visits as told by your doctor. This is important. °Contact a doctor if: °· You have a fever. °Get help right away if: °· You have trouble breathing. °· You are short of breath. °· You cannot stop coughing. °· You cough up thick or bloody spit (sputum). °· You feel sick to your stomach (nauseous), throw up (vomit), or have belly (abdominal) pain. °· Your pain gets worse and medicine does not help. °Summary °· A rib fracture is a break or crack in one of the bones of the ribs. °· Apply ice to the injured area and take medicines for pain as told by your doctor. °· Take deep breaths and cough many times a day. Hug a pillow every time you cough. °This information is not intended to replace advice given to you by your health care provider. Make sure you  discuss any questions you have with your health care provider. °Document Revised: 06/03/2017 Document Reviewed: 09/21/2016 °Elsevier Patient Education © 2020 [employer] ° ° °Pneumothorax °A pneumothorax is commonly called a collapsed lung. It is a condition in which air leaks from a lung and builds up between the thin layer of tissue that covers the lungs (visceral pleura) and the interior wall of the chest cavity (parietal pleura). The air gets trapped outside the lung, between the lung and the chest wall (pleural space). The air takes up space and prevents the lung from fully expanding. °This condition sometimes occurs suddenly with no apparent cause. The buildup of air may be small or large. A small pneumothorax may go away on its own. A large pneumothorax will require treatment and hospitalization. °What are the causes? °This condition may be caused by: °· Trauma and injury to the chest wall. °· Surgery and other medical procedures. °· A complication of an underlying lung problem, especially chronic obstructive pulmonary disease (COPD) or emphysema. °Sometimes the cause of this condition is not known. °What increases the risk? °You are more likely to develop this condition if: °· You have an underlying lung problem. °· You smoke. °· You are 20-40 years old, male, tall, and underweight. °· You have a personal or family history of pneumothorax. °· You have an eating disorder (  anorexia nervosa). °This condition can also happen quickly, even in people with no history of lung problems. °What are the signs or symptoms? °Sometimes a pneumothorax will have no symptoms. When symptoms are present, they can include: °· Chest pain. °· Shortness of breath. °· Increased rate of breathing. °· Bluish color to your lips or skin (cyanosis). °How is this diagnosed? °This condition may be diagnosed by: °· A medical history and physical exam. °· A chest X-ray, chest CT scan, or ultrasound. °How is this treated? °Treatment depends  on how severe your condition is. The goal of treatment is to remove the extra air and allow your lung to expand back to its normal size. °· For a small pneumothorax: °? No treatment may be needed. °? Extra oxygen is sometimes used to make it go away more quickly. °· For a large pneumothorax or a pneumothorax that is causing symptoms, a procedure is done to drain the air from your lungs. To do this, a health care provider may use: °? A needle with a syringe. This is used to suck air from a pleural space where no additional leakage is taking place. °? A chest tube. This is used to suck air where there is ongoing leakage into the pleural space. The chest tube may need to remain in place for several days until the air leak has healed. °· In more severe cases, surgery may be needed to repair the damage that is causing the leak. °· If you have multiple pneumothorax episodes or have an air leak that will not heal, a procedure called a pleurodesis may be done. A medicine is placed in the pleural space to irritate the tissues around the lung so that the lung will stick to the chest wall, seal any leaks, and stop any buildup of air in that space. °If you have an underlying lung problem, severe symptoms, or a large pneumothorax you will usually need to stay in the hospital. °Follow these instructions at home: °Lifestyle °· Do not use any products that contain nicotine or tobacco, such as cigarettes and e-cigarettes. These are major risk factors in pneumothorax. If you need help quitting, ask your health care provider. °· Do not lift anything that is heavier than 10 lb (4.5 kg), or the limit that your health care provider tells you, until he or she says that it is safe. °· Avoid activities that take a lot of effort (strenuous) for as long as told by your health care provider. °· Return to your normal activities as told by your health care provider. Ask your health care provider what activities are safe for you. °· Do not fly in  an airplane or scuba dive until your health care provider says it is okay. °General instructions °· Take over-the-counter and prescription medicines only as told by your health care provider. °· If a cough or pain makes it difficult for you to sleep at night, try sleeping in a semi-upright position in a recliner or by using 2 or 3 pillows. °· If you had a chest tube and it was removed, ask your health care provider when you can remove the bandage (dressing). While the dressing is in place, do not allow it to get wet. °· Keep all follow-up visits as told by your health care provider. This is important. °Contact a health care provider if: °· You cough up thick mucus (sputum) that is yellow or green in color. °· You were treated with a chest tube, and you have redness,   increasing pain, or discharge at the site where it was placed. °Get help right away if: °· You have increasing chest pain or shortness of breath. °· You have a cough that will not go away. °· You begin coughing up blood. °· You have pain that is getting worse or is not controlled with medicines. °· The site where your chest tube was located opens up. °· You feel air coming out of the site where the chest tube was placed. °· You have a fever or persistent symptoms for more than 2-3 days. °· You have a fever and your symptoms suddenly get worse. °These symptoms may represent a serious problem that is an emergency. Do not wait to see if the symptoms will go away. Get medical help right away. Call your local emergency services (911 in the [place] Do not drive yourself to the hospital. °Summary °· A pneumothorax, commonly called a collapsed lung, is a condition in which air leaks from a lung and gets trapped between the lung and the chest wall (pleural space). °· The buildup of air may be small or large. A small pneumothorax may go away on its own. A large pneumothorax will require treatment and hospitalization. °· Treatment for this condition depends on how  severe the pneumothorax is. The goal of treatment is to remove the extra air and allow the lung to expand back to its normal size. °This information is not intended to replace advice given to you by your health care provider. Make sure you discuss any questions you have with your health care provider. °Document Revised: 06/03/2017 Document Reviewed: 05/30/2017 °Elsevier Patient Education © 2020 Elsevier Inc. ° °

## 2019-07-20 ENCOUNTER — Encounter: Payer: Self-pay | Admitting: Oncology

## 2019-08-01 ENCOUNTER — Other Ambulatory Visit: Payer: Self-pay | Admitting: General Surgery

## 2019-08-01 ENCOUNTER — Ambulatory Visit
Admission: RE | Admit: 2019-08-01 | Discharge: 2019-08-01 | Disposition: A | Discharge status: Home / Self Care | Payer: Medicare Other | Source: Ambulatory Visit | Attending: General Surgery | Admitting: General Surgery

## 2019-08-01 ENCOUNTER — Other Ambulatory Visit: Payer: Self-pay

## 2019-08-01 DIAGNOSIS — J939 Pneumothorax, unspecified: Secondary | ICD-10-CM

## 2020-01-25 ENCOUNTER — Other Ambulatory Visit: Payer: Self-pay

## 2020-01-25 ENCOUNTER — Inpatient Hospital Stay: Payer: Medicare Other | Attending: Oncology | Admitting: Oncology

## 2020-01-25 VITALS — BP 150/85 | HR 77 | Temp 97.7°F | Resp 18 | Wt 187.6 lb

## 2020-01-25 DIAGNOSIS — Z923 Personal history of irradiation: Secondary | ICD-10-CM | POA: Diagnosis not present

## 2020-01-25 DIAGNOSIS — Z79899 Other long term (current) drug therapy: Secondary | ICD-10-CM | POA: Insufficient documentation

## 2020-01-25 DIAGNOSIS — Z9079 Acquired absence of other genital organ(s): Secondary | ICD-10-CM | POA: Insufficient documentation

## 2020-01-25 DIAGNOSIS — C61 Malignant neoplasm of prostate: Secondary | ICD-10-CM

## 2020-01-25 NOTE — Progress Notes (Signed)
Hematology and Oncology Follow Up Visit  Nathan Berg [id number] Apr 18, 1956 64 y.o. 01/25/2020 3:42 PM [name], MDWilson, [name], MD   Principle Diagnosis: Prostate cancer diagnosed in 2015.  He was found to have a PSA of 9.1 Gleason score 4+3 equal 7.  He has biochemical relapse currently.   Prior Therapy: He is s/p robotic assisted laparoscopic radical prostatectomy and bilateral lymph node dissection in March 2015.  The final pathological staging was T2CN0.  His Gleason score was 4+3 = 7 in both lobes without any evidence of angiolymphatic invasion or extraprostatic extension.      His PSA was up to 0.09 in October 2015.  He subsequently received salvage radiation under the care of Dr. Tammi Klippel after receiving 38.4 Pearline Cables in 38 fractions between May 06, 2014 and June 26, 2014.  He has received 6 months of androgen deprivation utilizing Lupron.  His a PSA was 0.086 in 2017 and the testosterone level was low at that time despite stopping androgen deprivation.  He subsequently started on testosterone replacement therapy with improvement in his testosterone level.  In 2018 his PSA remained low at 0.2 and subsequently 0.68.  In 2019 his PSA started to rise up to 0.94 in April 2019.  His PSA in October 2019 was 1.07, in December 2019 his PSA was 0.83, in July 2020 his PSA was 1.27, in October 2020 his PSA was 2.35 with a testosterone level of 245.  He has been off testosterone therapy.   PSA in March 2021 was 3.92 and in June 2021 was 8.18.   Current therapy: Under consideration to start therapy.  Interim History: Nathan Berg returns today for a follow-up visit.  Since the last visit, he reports no major changes in his health.  He has been off testosterone therapy and PSA is rising as above.  He has underwent a prostate specific scan that appears to be a PSMA PET scan.  Based on his description, he appears to have 2 areas of metastasis.  .    Medications: I have reviewed the  patient's current medications.  Current Outpatient Medications  Medication Sig Dispense Refill  . acetaminophen (TYLENOL) 500 MG tablet Take 2 tablets (1,000 mg total) by mouth every 6 (six) hours as needed. 30 tablet 0  . buPROPion (WELLBUTRIN XL) 300 MG 24 hr tablet Take 300 mg by mouth 2 (two) times daily.    . methocarbamol (ROBAXIN) 500 MG tablet Take 1 tablet (500 mg total) by mouth every 6 (six) hours as needed for muscle spasms. 30 tablet 0  . oxyCODONE (OXY IR/ROXICODONE) 5 MG immediate release tablet Take 1-2 tablets (5-10 mg total) by mouth every 6 (six) hours as needed for moderate pain. 30 tablet 0  . UNKNOWN TO PATIENT Take 1 tablet by mouth daily.     No current facility-administered medications for this visit.     Allergies:  Allergies  Allergen Reactions  . Codeine Nausea And Vomiting      Physical Exam: Blood pressure (!) 150/85, pulse 77, temperature 97.7 F (36.5 C), temperature source Temporal, resp. rate 18, weight 187 lb 9.6 oz (85.1 kg), SpO2 97 %.   ECOG: 0  General appearance: Comfortable appearing without any discomfort Head: Normocephalic without any trauma Oropharynx: Mucous membranes are moist and pink without any thrush or ulcers. Eyes: Pupils are equal and round reactive to light. Lymph nodes: No cervical, supraclavicular, inguinal or axillary lymphadenopathy.   Heart:regular rate and rhythm.  S1 and S2 without  leg edema. Lung: Clear without any rhonchi or wheezes.  No dullness to percussion. Abdomin: Soft, nontender, nondistended with good bowel sounds.  No hepatosplenomegaly. Musculoskeletal: No joint deformity or effusion.  Full range of motion noted. Neurological: No deficits noted on motor, sensory and deep tendon reflex exam. Skin: No petechial rash or dryness.  Appeared moist.      Lab Results: Lab Results  Component Value Date   WBC 8.0 07/17/2019   HGB 13.4 07/17/2019   HCT 40.1 07/17/2019   MCV 94.6 07/17/2019   PLT 209  07/17/2019     Chemistry      Component Value Date/Time   NA 138 07/17/2019 0322   K 4.4 07/17/2019 0322   CL 105 07/17/2019 0322   CO2 27 07/17/2019 0322   BUN 13 07/17/2019 0322   CREATININE 1.01 07/17/2019 0322      Component Value Date/Time   CALCIUM 8.6 (L) 07/17/2019 0322       Impression and Plan:  64 year old man with:  1.  Prostate cancer presented with a PSA of 9 and found toT2cN0 disease with a Gleason score 4+3 = 7 in 2015. He is status post radical prostatectomy and subsequently received salvage radiation therapy in 2016.  He developed biochemical relapse since that time.  He underwent prostate cancer specific scan although the results are unavailable to me.  By his description appears to have oligometastatic disease with 2 areas of uptake.   The natural course of his disease was reviewed and his PSA has been increasing with a rapid doubling time of less than 6 months off testosterone therapy.    Treatment options were discussed at this time: These include local therapy to his oligometastatic disease, systemic therapy utilizing androgen deprivation therapy alone, combination androgen deprivation therapy with oral androgen synthesis inhibitor or androgen receptor blockade.  After discussion today, we will obtain these imaging studies and arrange for consultation with Dr. Tammi Klippel to discuss the role of SBRT for oligometastatic disease.  2.  Follow-up: Will be the next few months to follow his progress and recheck his PSA.   30  minutes were dedicated to this encounter.  Time was spent on reviewing his disease status, reviewing laboratory data, treatment options and complications noted therapy.   Zola Button, MD 7/23/20213:42 PM

## 2020-01-28 ENCOUNTER — Encounter: Payer: Self-pay | Admitting: Medical Oncology

## 2020-01-28 ENCOUNTER — Inpatient Hospital Stay
Admission: RE | Admit: 2020-01-28 | Discharge: 2020-01-28 | Disposition: A | Discharge status: Home / Self Care | Payer: Self-pay | Source: Ambulatory Visit | Attending: Oncology | Admitting: Oncology

## 2020-01-28 ENCOUNTER — Other Ambulatory Visit: Payer: Self-pay | Admitting: Radiation Oncology

## 2020-01-28 ENCOUNTER — Ambulatory Visit
Admission: RE | Admit: 2020-01-28 | Discharge: 2020-01-28 | Disposition: A | Discharge status: Home / Self Care | Payer: Self-pay | Source: Ambulatory Visit | Attending: Radiation Oncology | Admitting: Radiation Oncology

## 2020-01-28 ENCOUNTER — Other Ambulatory Visit (HOSPITAL_COMMUNITY): Payer: Self-pay | Admitting: Oncology

## 2020-01-28 ENCOUNTER — Other Ambulatory Visit: Payer: Self-pay | Admitting: Oncology

## 2020-01-28 DIAGNOSIS — C801 Malignant (primary) neoplasm, unspecified: Secondary | ICD-10-CM

## 2020-01-28 DIAGNOSIS — C61 Malignant neoplasm of prostate: Secondary | ICD-10-CM

## 2020-01-28 NOTE — Progress Notes (Signed)
Spoke with patient to introduce myself as the prostate nurse navigator and discuss my role. He was referred to Dr. Alen Blew for consult and it was recommended to continue to monitor PSA>  He was referred back to see Dr. Alen Blew 7/23, due to rising PSA.  He had a PSMA PET done at [facility] 6/29. I informed him  I was able to obtain PET scan results and I requested scan on a disc before I was notified he brought the disc.  He is quite anxious and would like an appointment to see Dr. Tammi Klippel as soon as possible to discuss radiation. I assured him that we are working to get him scheduled. I gave him my contact information and asked him to call me with questions or concerns.

## 2020-01-29 NOTE — Progress Notes (Signed)
Spoke with patient to confirm he was called with radiation consult appointment 7/30, arriving at 9:30. He will return @ 1:30pm for CT simulation. He voiced understanding. He shared, his wife was recently diagnosed with breast cancer and is being seen here at [facility]. I offered my support in any way that I can help. I asked him to call me with questions or concerns.

## 2020-01-29 NOTE — Progress Notes (Signed)
[facility] to request PET scan on disc and [employer] Ex[employer]. Fax request sent to [phone].

## 2020-01-30 ENCOUNTER — Telehealth: Payer: Self-pay | Admitting: Oncology

## 2020-01-30 NOTE — Progress Notes (Signed)
  Radiation Oncology         [phone]) [phone] ________________________________  Name: Nathan Berg MRN: [id number]  Date: 02/01/2020  DOB: 1956-01-13  STEREOTACTIC BODY RADIOTHERAPY SIMULATION AND TREATMENT PLANNING NOTE    ICD-10-CM   1. Malignant neoplasm of prostate ([place])  C61     DIAGNOSIS:  64 yo man with 2 oligometastatic deposits from stage IV prostate cancer  NARRATIVE:  The patient was brought to the [facility].  Identity was confirmed.  All relevant records and images related to the planned course of therapy were reviewed.  The patient freely provided informed written consent to proceed with treatment after reviewing the details related to the planned course of therapy. The consent form was witnessed and verified by the simulation staff.  Then, the patient was set-up in a stable reproducible  supine position for radiation therapy.  A BodyFix immobilization pillow was fabricated for reproducible positioning.  Surface markings were placed.  The CT images were loaded into the planning software.  The gross target volumes (GTV) and planning target volumes (PTV) were delinieated, and avoidance structures were contoured.  Treatment planning then occurred.  The radiation prescription was entered and confirmed.  A total of two complex treatment devices were fabricated in the form of the BodyFix immobilization pillow and a neck accuform cushion.  I have requested : 3D Simulation  I have requested a DVH of the following structures: targets and all normal structures near the target including bladder, rectum, bowel and femoral heads as noted on the radiation plan to maintain doses in adherence with established limits  PLAN:  The patient will receive 50 Gy in 5 fractions to the right iliac crest and right internal iliac node metasases.  ________________________________  Sheral Apley Tammi Klippel, M.D.

## 2020-01-30 NOTE — Progress Notes (Signed)
Histology and Location of Primary Cancer: Prostate cancer diagnosed in 2015.  He was found to have a PSA of 9.1 Gleason score 4+3 equal 7.  He has biochemical relapse currently.  Sites of Visceral and Bony Metastatic Disease: bone and lymph node  Location(s) of Symptomatic Metastases: Hypermetabolic lymph node right iliac bifurcation and bony right iliac wing  Past/Anticipated chemotherapy by medical oncology, if any:  He is s/p robotic assisted laparoscopic radical prostatectomy and bilateral lymph node dissection in March 2015.  His PSA was up to 0.09 in October 2015.He subsequently received salvage radiation under the care of Dr. Tammi Klippel after receiving 38.4 Pearline Cables in 38 fractions between May 06, 2014 and June 26, 2014.He has received 6 months of androgen deprivation utilizing Lupron.  His a PSA was 0.086 in 2017 and the testosterone level was low at that time despite stopping androgen deprivation. He subsequently started on testosterone replacement therapy with improvement in his testosterone level. In 2018 his PSA remained low at 0.2 and subsequently 0.68. In 2019 his PSA started to rise up to 0.94 in April 2019. His PSA in October 2019 was 1.07, in December 2019 his PSA was 0.83, in July 2020 his PSA was 1.27, in October 2020 his PSA was 2.35 with a testosterone level of 245.  He has been off testosterone therapy.  Pain on a scale of 0-10 is: denies   If Spine Met(s), symptoms, if any, include:  Bowel/Bladder retention or incontinence (please describe): Denies bowel or bladder retention. Reports occasional urinary leakage at night or in certain positions during the day. Reports occasional constipation managed easily with OTC medications.  Numbness or weakness in extremities (please describe): Reports numbness and tingling intermittently in his arms. Denies numbness or tingling in his legs. Denies weakness in his less. Reports noting less space awareness and coordination.  Denies recent falls.  Current Decadron regimen, if applicable: denies  Ambulatory status? Walker? Wheelchair?: Ambulatory  SAFETY ISSUES:  Prior radiation? yes  Pacemaker/ICD? denies  Possible current pregnancy? no, male patient  Is the patient on methotrexate? denies  Current Complaints / other details:  64 year old male. Married. Retired from [employer].   Patient understands from Dr. Alen Blew that his testosterone is in the 80s thus ADT may not be needed. Patient resides in [place].

## 2020-01-30 NOTE — Telephone Encounter (Signed)
Scheduled per 07/23 los, patient has been called and voicemail was left.

## 2020-02-01 ENCOUNTER — Ambulatory Visit
Admission: RE | Admit: 2020-02-01 | Discharge: 2020-02-01 | Disposition: A | Discharge status: Home / Self Care | Payer: Medicare Other | Source: Ambulatory Visit | Attending: Radiation Oncology | Admitting: Radiation Oncology

## 2020-02-01 ENCOUNTER — Encounter: Payer: Self-pay | Admitting: Radiation Oncology

## 2020-02-01 ENCOUNTER — Other Ambulatory Visit: Payer: Self-pay

## 2020-02-01 ENCOUNTER — Encounter: Payer: Self-pay | Admitting: Medical Oncology

## 2020-02-01 VITALS — BP 117/73 | HR 72 | Temp 98.8°F | Resp 18 | Ht 71.0 in | Wt 185.4 lb

## 2020-02-01 DIAGNOSIS — Z79899 Other long term (current) drug therapy: Secondary | ICD-10-CM | POA: Diagnosis not present

## 2020-02-01 DIAGNOSIS — M129 Arthropathy, unspecified: Secondary | ICD-10-CM | POA: Insufficient documentation

## 2020-02-01 DIAGNOSIS — Z923 Personal history of irradiation: Secondary | ICD-10-CM | POA: Diagnosis not present

## 2020-02-01 DIAGNOSIS — K219 Gastro-esophageal reflux disease without esophagitis: Secondary | ICD-10-CM | POA: Insufficient documentation

## 2020-02-01 DIAGNOSIS — C61 Malignant neoplasm of prostate: Secondary | ICD-10-CM | POA: Diagnosis present

## 2020-02-01 DIAGNOSIS — C7951 Secondary malignant neoplasm of bone: Secondary | ICD-10-CM

## 2020-02-01 DIAGNOSIS — C775 Secondary and unspecified malignant neoplasm of intrapelvic lymph nodes: Secondary | ICD-10-CM

## 2020-02-01 DIAGNOSIS — Z8041 Family history of malignant neoplasm of ovary: Secondary | ICD-10-CM | POA: Diagnosis not present

## 2020-02-01 DIAGNOSIS — Z87442 Personal history of urinary calculi: Secondary | ICD-10-CM | POA: Insufficient documentation

## 2020-02-01 NOTE — Progress Notes (Signed)
Radiation Oncology         ([phone]) [phone] ________________________________  Outpatient Re-Consultation  Name: Nathan Berg MRN: [id number]  Date: 02/01/2020  DOB: [birth year]  CC:[name], MD  Wyatt Portela, MD   REFERRING PHYSICIAN: Wyatt Portela, MD  DIAGNOSIS: 64 y.o. 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    ICD-10-CM   1. Secondary malignant neoplasm of bone and bone marrow (HCC)  C79.51    C79.52     HISTORY OF PRESENT ILLNESS: Nathan Berg is a 64 y.o. male with a diagnosis of metastatic prostate cancer. He was initially diagnosed with Gleason 4+3 prostate adenocarcinoma on biopsy performed by Dr. Risa Grill on 07/17/13 with a PSA of 9.13. He elected to proceed with robotic prostatectomy with BPLND on 09/19/13 with final surgical pathology revealing pT2c, Gleason 4+3 old prostatic adenocarcinoma with favorable pathology indicating no extraprostatic extension, negative surgical margins, no seminal vesicle involvement and all lymph nodes sampled were negative (0/4). However, his postoperative PSA was very low, detectable at 0.03 and rose to 0.09 by 04/2014. He was subsequently treated with salvage radiation therapy to the prostate fossa under our care, concurrent with 6 months of ADT. His PSA nadir was undetectable.  Or  In 2017, his testosterone level was low despite stopping ADT and his PSA was 0.086.  He was feeling very sluggish, therefore, he was started on testosterone replacement therapy at that time. His PSA continued to rise but remained at or under 1 until 01/2019 when it was 1.27.  The PSA increased to 2.35 in 04/2019, so his TRT was decreased to 1/4 of his normal dose.  He met with Dr. Alen Blew in October 2020 who recommended continuing to closely monitor the PSA.  Unfortunately, his PSA continued to rise so he was advised to stop TRT altogether.  The PSA was 3.92 in 09/2019 and most recently has increased significantly to 8.18 in  12/2019.  He underwent PSMA PET scan on 01/01/2020 at [facility] in [place], showing a single enlarged hypermetabolic right iliac lymph node, just above the bifurcation, measuring 1.5 cm as well as a single hypermetabolic skeletal focus in the right iliac wing measuring 3.5 cm.  There was no evidence of recurrent or metastatic prostate cancer at any other site.  Fortunately, the patient reports that he has not had any pain.  He met back with Dr. Alen Blew in follow-up on 01/25/2020 to review his recent PSA and PET scan results.  They discussed treatment options including local therapy with SBRT to the oligometastatic disease, systemic therapy utilizing ADT alone, combination ADT with oral androgen synthesis inhibitor or androgen receptor blockade.  The patient was most interested in considering local therapy with stereotactic radiation and reserving further systemic therapy pending his response.  The patient reviewed the PET scan and PSA results with his urologist, Dr. Louis Meckel and medical oncologist, Dr. Alen Blew and he has kindly been referred today for discussion of potential radiation treatment options.   PREVIOUS RADIATION THERAPY: Yes  05/06/14 - 06/26/14: Prostatic Fossa / 68.4 Gy in 38 fractions of 1.8 Gy (in combination with ST-ADT)  PAST MEDICAL HISTORY:  Past Medical History:  Diagnosis Date  . Arthritis   . Cancer ([place])    dx. 2 months ago- Prostate cancer  . GERD (gastroesophageal reflux disease)   . H/O seasonal allergies   . History of kidney stones    x1  . Prostate cancer ([place])  PAST SURGICAL HISTORY: Past Surgical History:  Procedure Laterality Date  . CHEST TUBE INSERTION  07/16/2019   trauma   . COLONOSCOPY    . HERNIA REPAIR Bilateral    20 yrs ago  . LYMPHADENECTOMY Bilateral 09/19/2013   Procedure: LYMPHADENECTOMY  PELVIC LYMPH NODE DISSECTION;  Surgeon: Bernestine Amass, MD;  Location: [facility];  Service: Urology;  Laterality: Bilateral;  .  PROSTATE BIOPSY    . ROBOT ASSISTED LAPAROSCOPIC RADICAL PROSTATECTOMY N/A 09/19/2013   Procedure: ROBOTIC ASSISTED LAPAROSCOPIC RADICAL PROSTATECTOMY;  Surgeon: Bernestine Amass, MD;  Location: [facility];  Service: Urology;  Laterality: N/A;  . TONSILLECTOMY     child  . VARICOCELE EXCISION      FAMILY HISTORY:  Family History  Problem Relation Age of Onset  . Prostate cancer Father   . Breast cancer Neg Hx   . Colon cancer Neg Hx   . Pancreatic cancer Neg Hx     SOCIAL HISTORY:  Social History   Socioeconomic History  . Marital status: Married    Spouse name: Not on file  . Number of children: Not on file  . Years of education: Not on file  . Highest education level: Not on file  Occupational History  . Not on file  Tobacco Use  . Smoking status: Never Smoker  . Smokeless tobacco: Never Used  Vaping Use  . Vaping Use: Never used  Substance and Sexual Activity  . Alcohol use: Yes    Comment: 4 beers per week  . Drug use: No  . Sexual activity: Yes  Other Topics Concern  . Not on file  Social History Narrative  . Not on file   Social Determinants of Health   Financial Resource Strain:   . Difficulty of Paying Living Expenses:   Food Insecurity:   . Worried About Charity fundraiser in the Last Year:   . Arboriculturist in the Last Year:   Transportation Needs:   . Film/video editor (Medical):   Marland Kitchen Lack of Transportation (Non-Medical):   Physical Activity:   . Days of Exercise per Week:   . Minutes of Exercise per Session:   Stress:   . Feeling of Stress :   Social Connections:   . Frequency of Communication with Friends and Family:   . Frequency of Social Gatherings with Friends and Family:   . Attends Religious Services:   . Active Member of Clubs or Organizations:   . Attends Archivist Meetings:   Marland Kitchen Marital Status:   Intimate Partner Violence:   . Fear of Current or Ex-Partner:   . Emotionally Abused:   Marland Kitchen Physically Abused:   . Sexually  Abused:     ALLERGIES: Codeine  MEDICATIONS:  Current Outpatient Medications  Medication Sig Dispense Refill  . buPROPion (WELLBUTRIN SR) 200 MG 12 hr tablet Take 200 mg by mouth 2 (two) times daily.    Marland Kitchen escitalopram (LEXAPRO) 20 MG tablet Take by mouth.    . traZODone (DESYREL) 25 mg TABS tablet Take 25 mg by mouth at bedtime.     No current facility-administered medications for this encounter.    REVIEW OF SYSTEMS:  On review of systems, the patient reports that he is doing well overall. He denies any chest pain, shortness of breath, cough, fevers, chills, night sweats, or unintended weight loss.in fact, since discontinuing the TRT, he has gained approximately 15 pounds and has significant fatigue/decreased stamina.  He denies any bowel  disturbances, and denies abdominal pain, nausea or vomiting. He denies any new musculoskeletal or joint aches or pains. He reports occasional urinary leakage, occasional constipation easily managed with OTC medications, numbness and tingling intermittently in his arms, and less spatial awareness and coordination. He denies numbness, tingling, or weakness in his legs and any recent falls. A complete review of systems is obtained and is otherwise negative.    PHYSICAL EXAM:  Wt Readings from Last 3 Encounters:  02/01/20 185 lb 6.4 oz (84.1 kg)  01/25/20 187 lb 9.6 oz (85.1 kg)  07/17/19 179 lb 14.3 oz (81.6 kg)   Temp Readings from Last 3 Encounters:  02/01/20 98.8 F (37.1 C)  01/25/20 97.7 F (36.5 C) (Temporal)  07/19/19 98.6 F (37 C) (Oral)   BP Readings from Last 3 Encounters:  02/01/20 117/73  01/25/20 (!) 150/85  07/19/19 138/80   Pulse Readings from Last 3 Encounters:  02/01/20 72  01/25/20 77  07/19/19 64   Pain Assessment Pain Score: 0-No pain/10  In general this is a well appearing Caucasian male in no acute distress. He is alert and oriented x4 and appropriate throughout the examination. HEENT reveals that the patient is  normocephalic, atraumatic. EOMs are intact. PERRLA. Skin is intact without any evidence of gross lesions. Cardiopulmonary assessment is negative for acute distress and he exhibits normal effort. The abdomen is soft, non tender, non distended. Lower extremities are negative for pretibial pitting edema, deep calf tenderness, cyanosis or clubbing.   KPS = 100  100 - Normal; no complaints; no evidence of disease. 90   - Able to carry on normal activity; minor signs or symptoms of disease. 80   - Normal activity with effort; some signs or symptoms of disease. 15   - Cares for self; unable to carry on normal activity or to do active work. 60   - Requires occasional assistance, but is able to care for most of his personal needs. 50   - Requires considerable assistance and frequent medical care. 79   - Disabled; requires special care and assistance. 87   - Severely disabled; hospital admission is indicated although death not imminent. 15   - Very sick; hospital admission necessary; active supportive treatment necessary. 10   - Moribund; fatal processes progressing rapidly. 0     - Dead  Karnofsky DA, Abelmann Wheaton, Craver LS and Burchenal [facility] [phone]) The use of the nitrogen mustards in the palliative treatment of carcinoma: with particular reference to bronchogenic carcinoma Cancer 1 [phone]  LABORATORY DATA:  Lab Results  Component Value Date   WBC 8.0 07/17/2019   HGB 13.4 07/17/2019   HCT 40.1 07/17/2019   MCV 94.6 07/17/2019   PLT 209 07/17/2019   Lab Results  Component Value Date   NA 138 07/17/2019   K 4.4 07/17/2019   CL 105 07/17/2019   CO2 27 07/17/2019   No results found for: ALT, AST, GGT, ALKPHOS, BILITOT   RADIOGRAPHY: No results found.    IMPRESSION/PLAN: 1. 63 y.o. 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 Today, we talked to the patient about the findings and workup thus far. We reviewed the PSMA PET scan results with the patient and  discussed the natural history of prostate cancer and general treatment, highlighting the role of radiotherapy in the management of sites of metastases. We discussed the available radiation techniques, and focused on the details and logistics of delivery.  Given his oligometastatic disease, the recommendation  is to proceed with 5 fractions of stereotactic body radiotherapy (SBRT) to the two involved sites. We reviewed the anticipated acute and late sequelae associated with radiation in this setting. The patient was encouraged to ask questions that were answered to his stated satisfaction.  At the conclusion of our conversation, the patient is interested in moving forward with the recommended 5 fraction course of SBRT targeting the involved right iliac lymph node and lesion in the right iliac wing.  He appears to have a good understanding of his disease and our treatment recommendations which are of curative intent.  He has freely signed written consent to proceed today in the office and a copy of this document will be placed in his medical record.  He will proceed with CT simulation/treatment planning today, in anticipation of beginning his treatments in the very near future.  We will share our discussion with Dr. Louis Meckel and Dr. Alen Blew and proceed with treatment planning accordingly. We enjoyed meeting back with him today and look forward to continuing to participate in his care.   Nicholos Johns, PA-C    Tyler Pita, MD  [facility] Direct Dial: [phone]  Fax: [phone] [email or web address]  Skype  [email or web address]   This document serves as a record of services personally performed by Tyler Pita, MD and Freeman Caldron, PA-C. It was created on their behalf by Wilburn Mylar, a trained medical scribe. The creation of this record is based on the scribe's personal observations and the provider's statements to them. This document has been checked and approved by the  attending provider.

## 2020-02-13 ENCOUNTER — Ambulatory Visit
Admission: RE | Admit: 2020-02-13 | Discharge: 2020-02-13 | Disposition: A | Discharge status: Home / Self Care | Payer: Medicare Other | Source: Ambulatory Visit | Attending: Radiation Oncology | Admitting: Radiation Oncology

## 2020-02-13 DIAGNOSIS — C61 Malignant neoplasm of prostate: Secondary | ICD-10-CM | POA: Insufficient documentation

## 2020-02-15 ENCOUNTER — Ambulatory Visit
Admission: RE | Admit: 2020-02-15 | Discharge: 2020-02-15 | Disposition: A | Discharge status: Home / Self Care | Payer: Medicare Other | Source: Ambulatory Visit | Attending: Radiation Oncology | Admitting: Radiation Oncology

## 2020-02-15 ENCOUNTER — Other Ambulatory Visit: Payer: Self-pay

## 2020-02-15 DIAGNOSIS — C61 Malignant neoplasm of prostate: Secondary | ICD-10-CM | POA: Diagnosis not present

## 2020-02-18 ENCOUNTER — Ambulatory Visit
Admission: RE | Admit: 2020-02-18 | Discharge: 2020-02-18 | Disposition: A | Discharge status: Home / Self Care | Payer: Medicare Other | Source: Ambulatory Visit | Attending: Radiation Oncology | Admitting: Radiation Oncology

## 2020-02-18 ENCOUNTER — Other Ambulatory Visit: Payer: Self-pay

## 2020-02-18 DIAGNOSIS — C61 Malignant neoplasm of prostate: Secondary | ICD-10-CM | POA: Diagnosis not present

## 2020-02-20 ENCOUNTER — Other Ambulatory Visit: Payer: Self-pay

## 2020-02-20 ENCOUNTER — Ambulatory Visit
Admission: RE | Admit: 2020-02-20 | Discharge: 2020-02-20 | Disposition: A | Discharge status: Home / Self Care | Payer: Medicare Other | Source: Ambulatory Visit | Attending: Radiation Oncology | Admitting: Radiation Oncology

## 2020-02-20 DIAGNOSIS — C61 Malignant neoplasm of prostate: Secondary | ICD-10-CM | POA: Diagnosis not present

## 2020-02-22 ENCOUNTER — Ambulatory Visit
Admission: RE | Admit: 2020-02-22 | Discharge: 2020-02-22 | Disposition: A | Discharge status: Home / Self Care | Payer: Medicare Other | Source: Ambulatory Visit | Attending: Radiation Oncology | Admitting: Radiation Oncology

## 2020-02-22 ENCOUNTER — Other Ambulatory Visit: Payer: Self-pay

## 2020-02-22 ENCOUNTER — Encounter: Payer: Self-pay | Admitting: Medical Oncology

## 2020-02-22 ENCOUNTER — Encounter: Payer: Self-pay | Admitting: Urology

## 2020-02-22 DIAGNOSIS — C61 Malignant neoplasm of prostate: Secondary | ICD-10-CM | POA: Diagnosis not present

## 2020-03-26 ENCOUNTER — Ambulatory Visit: Payer: Self-pay | Admitting: Urology

## 2020-04-01 ENCOUNTER — Encounter: Payer: Self-pay | Admitting: Urology

## 2020-04-01 ENCOUNTER — Other Ambulatory Visit: Payer: Self-pay

## 2020-04-01 NOTE — Progress Notes (Signed)
  Radiation Oncology         [phone]) [phone] ________________________________  Name: Nathan Berg MRN: [id number]  Date: 02/22/2020  DOB: June 25, 1956  End of Treatment Note  Diagnosis:   64 y.o. 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     Indication for treatment:  Curative, Definitive SBRT      Radiation treatment dates:   02/13/20 - 02/22/20  Site/dose:   The right iliac crest and right internal iliac node targets were treated to 50 Gy in 5 fractions of 10 Gy each  Beams/energy:   The patient was treated using stereotactic body radiotherapy according to a 3D conformal radiotherapy plan.  Volumetric arc fields were employed to deliver 6 MV X-rays.  Image guidance was performed with per fraction cone beam CT prior to treatment under personal MD supervision.  Immobilization was achieved using BodyFix Pillow.  Narrative: The patient tolerated radiation treatment relatively well without any ill side effects.  Plan: The patient has completed radiation treatment. The patient will return to radiation oncology clinic for routine followup in one month. I advised them to call or return sooner if they have any questions or concerns related to their recovery or treatment. ________________________________  Sheral Apley. Tammi Klippel, M.D.

## 2020-04-01 NOTE — Progress Notes (Signed)
Radiation Oncology         [phone] ________________________________  Name: Nathan Berg MRN: [id number]  Date: 04/02/2020  DOB: 1956-02-22  Post Treatment Note  CC: [name], MD  [name], MD  Diagnosis:   64 y.o. 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  Interval Since Last Radiation:  5.5 weeks  02/13/20 - 02/22/20:  The right iliac crest and right internal iliac node targets were treated to 50 Gy in 5 fractions of 10 Gy each  05/06/14 - 06/26/14: Prostatic Fossa / 68.4 Gy in 38 fractions of 1.8 Gy (in combination with ST-ADT)  Narrative:  I spoke with the patient to conduct his routine scheduled 1 month follow up visit via telephone to spare the patient unnecessary potential exposure in the healthcare setting during the current COVID-19 pandemic.  The patient was notified in advance and gave permission to proceed with this visit format.  He tolerated radiation treatment relatively well without any ill side effects.                                On review of systems, the patient states that he is doing very well in general and is currently without complaints. He specifically denies any LUTS, abdominal pain, nausea, vomiting, diarrhea or constipation.  He is not having any back pain, paresthesias or focal weakness in the lower extremities.  He continues with decreased stamina/fatigue associated with his low testosterone levels but otherwise is quite pleased with his progress to date.  ALLERGIES:  is allergic to codeine.  Meds: Current Outpatient Medications  Medication Sig Dispense Refill  . ALPRAZolam (XANAX) 1 MG tablet TAKE 1/2 TO 1 TABLET TWICE A DAY AS NEEDED FOR ANXIETY. Must have office or telemedicine visit for more.    Marland Kitchen buPROPion (WELLBUTRIN SR) 200 MG 12 hr tablet Take 200 mg by mouth 2 (two) times daily.    Marland Kitchen escitalopram (LEXAPRO) 20 MG tablet Take by mouth.    . traZODone (DESYREL) 25 mg TABS tablet Take 25 mg by  mouth at bedtime.    . traZODone (DESYREL) 100 MG tablet TAKE DAILY AS DIRECTED FOR INSOMNIA (Patient not taking: Reported on 04/01/2020)     No current facility-administered medications for this encounter.    Physical Findings:  vitals were not taken for this visit.   /Unable to assess due to telephone follow up visit format.   Lab Findings: Lab Results  Component Value Date   WBC 8.0 07/17/2019   HGB 13.4 07/17/2019   HCT 40.1 07/17/2019   MCV 94.6 07/17/2019   PLT 209 07/17/2019     Radiographic Findings: No results found.  Impression/Plan: 1. 64 y.o. 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 He will continue to follow up with urology for ongoing PSA determinations and has an appointment scheduled for repeat labs on 04/22/2020 and a follow-up visit with Dr. Louis Meckel on 04/29/2020. He understands what to expect with regards to PSA monitoring going forward. He will also continue to follow up regularly with Dr. Alen Blew with his next visit scheduled for 04/25/20. I will look forward to following his response to treatment via correspondence with urology, and would be happy to continue to participate in his care if clinically indicated. I encouraged him to call or return to the office if he has any questions regarding his previous radiation or  possible radiation side effects. He was comfortable with this plan and will follow up as needed.    Nicholos Johns, PA-C

## 2020-04-02 ENCOUNTER — Ambulatory Visit
Admission: RE | Admit: 2020-04-02 | Discharge: 2020-04-02 | Disposition: A | Discharge status: Home / Self Care | Payer: Medicare Other | Source: Ambulatory Visit | Attending: Urology | Admitting: Urology

## 2020-04-02 DIAGNOSIS — C61 Malignant neoplasm of prostate: Secondary | ICD-10-CM

## 2020-04-02 DIAGNOSIS — C775 Secondary and unspecified malignant neoplasm of intrapelvic lymph nodes: Secondary | ICD-10-CM

## 2020-04-02 DIAGNOSIS — C7951 Secondary malignant neoplasm of bone: Secondary | ICD-10-CM

## 2020-04-25 ENCOUNTER — Other Ambulatory Visit: Payer: Self-pay

## 2020-04-25 ENCOUNTER — Inpatient Hospital Stay: Payer: Medicare Other | Attending: Oncology

## 2020-04-25 ENCOUNTER — Inpatient Hospital Stay (HOSPITAL_BASED_OUTPATIENT_CLINIC_OR_DEPARTMENT_OTHER): Payer: Medicare Other | Admitting: Oncology

## 2020-04-25 VITALS — BP 135/78 | HR 74 | Temp 97.8°F | Resp 18 | Ht 71.0 in | Wt 192.0 lb

## 2020-04-25 DIAGNOSIS — C61 Malignant neoplasm of prostate: Secondary | ICD-10-CM | POA: Diagnosis present

## 2020-04-25 DIAGNOSIS — Z79899 Other long term (current) drug therapy: Secondary | ICD-10-CM | POA: Diagnosis not present

## 2020-04-25 DIAGNOSIS — Z923 Personal history of irradiation: Secondary | ICD-10-CM | POA: Insufficient documentation

## 2020-04-25 LAB — CMP (CANCER CENTER ONLY)
ALT: 12 U/L (ref 0–44)
AST: 16 U/L (ref 15–41)
Albumin: 3.7 g/dL (ref 3.5–5.0)
Alkaline Phosphatase: 85 U/L (ref 38–126)
Anion gap: 5 (ref 5–15)
BUN: 19 mg/dL (ref 8–23)
CO2: 30 mmol/L (ref 22–32)
Calcium: 9.3 mg/dL (ref 8.9–10.3)
Chloride: 103 mmol/L (ref 98–111)
Creatinine: 1.06 mg/dL (ref 0.61–1.24)
GFR, Estimated: >60 mL/min (ref >60)
Glucose, Bld: 94 mg/dL (ref 70–99)
Potassium: 4.5 mmol/L (ref 3.5–5.1)
Sodium: 138 mmol/L (ref 135–145)
Total Bilirubin: 0.4 mg/dL (ref 0.3–1.2)
Total Protein: 6.3 g/dL — ABNORMAL LOW (ref 6.5–8.1)

## 2020-04-25 LAB — CBC WITH DIFFERENTIAL (CANCER CENTER ONLY)
Abs Immature Granulocytes: 0.01 10*3/uL (ref 0.00–0.07)
Basophils Absolute: 0 10*3/uL (ref 0.0–0.1)
Basophils Relative: 1 %
Eosinophils Absolute: 0.3 10*3/uL (ref 0.0–0.5)
Eosinophils Relative: 8 %
HCT: 42.7 % (ref 39.0–52.0)
Hemoglobin: 14.3 g/dL (ref 13.0–17.0)
Immature Granulocytes: 0 %
Lymphocytes Relative: 28 %
Lymphs Abs: 1.1 10*3/uL (ref 0.7–4.0)
MCH: 31.6 pg (ref 26.0–34.0)
MCHC: 33.5 g/dL (ref 30.0–36.0)
MCV: 94.3 fL (ref 80.0–100.0)
Monocytes Absolute: 0.4 10*3/uL (ref 0.1–1.0)
Monocytes Relative: 11 %
Neutro Abs: 2 10*3/uL (ref 1.7–7.7)
Neutrophils Relative %: 52 %
Platelet Count: 237 10*3/uL (ref 150–400)
RBC: 4.53 MIL/uL (ref 4.22–5.81)
RDW: 11.9 % (ref 11.5–15.5)
WBC Count: 3.9 10*3/uL — ABNORMAL LOW (ref 4.0–10.5)
nRBC: 0 % (ref 0.0–0.2)

## 2020-04-25 NOTE — Progress Notes (Signed)
Hematology and Oncology Follow Up Visit  Nathan Berg [id number] 08/24/1955 64 y.o. 04/25/2020 8:54 AM Nathan Berg, MDWilson, [name], Berg   Principle Diagnosis: 64 year old with castration-cancer prostate cancer with biochemical relapse noted in 20117.he was initially diagnosed in 2015 with PSA of 9.1 Gleason score 4+3 equal 7.     Prior Therapy: He is s/p robotic assisted laparoscopic radical prostatectomy and bilateral lymph node dissection in March 2015.  The final pathological staging was T2CN0.  His Gleason score was 4+3 = 7 in both lobes without any evidence of angiolymphatic invasion or extraprostatic extension.      His PSA was up to 0.09 in October 2015.  He subsequently received salvage radiation under the care of Dr. Tammi Klippel after receiving 38.4 Pearline Cables in 38 fractions between May 06, 2014 and June 26, 2014.  He has received 6 months of androgen deprivation utilizing Lupron.  His a PSA was 0.086 in 2017 and the testosterone level was low at that time despite stopping androgen deprivation.  He subsequently started on testosterone replacement therapy with improvement in his testosterone level.  In 2018 his PSA remained low at 0.2 and subsequently 0.68.  In 2019 his PSA started to rise up to 0.94 in April 2019.  His PSA in October 2019 was 1.07, in December 2019 his PSA was 0.83, in July 2020 his PSA was 1.27, in October 2020 his PSA was 2.35 with a testosterone level of 245.  He has been off testosterone therapy.   PSA in March 2021 was 3.92 and in June 2021 was 8.18.  He was found to have oligometastatic disease.  He is status post radiation therapy between August 11 and March 02, 2020.  Right iliac crest and the right internal iliac lymph node.  Current therapy: Under consideration for additional therapy.  Interim History: Nathan Berg is here for return follow-up.  Since her last visit, he completed radiation therapy oligometastatic disease in the right iliac crest and  right internal iliac lymph nodes.  Since that time, he reports no major changes in his health. He denies any neuropathy vomiting or abdominal pain. He denies any recent hospitalization or illnesses. He does report weight gain, flashes associated with low testosterone level.  .    Medications: Reviewed without changes. Current Outpatient Medications  Medication Sig Dispense Refill  . ALPRAZolam (XANAX) 1 MG tablet TAKE 1/2 TO 1 TABLET TWICE A DAY AS NEEDED FOR ANXIETY. Must have office or telemedicine visit for more.    Marland Kitchen buPROPion (WELLBUTRIN SR) 200 MG 12 hr tablet Take 200 mg by mouth 2 (two) times daily.    Marland Kitchen escitalopram (LEXAPRO) 20 MG tablet Take by mouth.    . traZODone (DESYREL) 100 MG tablet TAKE DAILY AS DIRECTED FOR INSOMNIA (Patient not taking: Reported on 04/01/2020)    . traZODone (DESYREL) 25 mg TABS tablet Take 25 mg by mouth at bedtime.     No current facility-administered medications for this visit.     Allergies:  Allergies  Allergen Reactions  . Codeine Nausea And Vomiting      Physical Exam: Blood pressure 135/78, pulse 74, temperature 97.8 F (36.6 C), temperature source Tympanic, resp. rate 18, height 5\' 11"  (1.803 m), weight 192 lb (87.1 kg), SpO2 97 %.    ECOG: 0  General appearance: Alert, awake without any distress. Head: Atraumatic without abnormalities Oropharynx: Without any thrush or ulcers. Eyes: No scleral icterus. Lymph nodes: No lymphadenopathy noted in the cervical, supraclavicular, or axillary nodes  Heart:regular rate and rhythm, without any murmurs or gallops.   Lung: Clear to auscultation without any rhonchi, wheezes or dullness to percussion. Abdomin: Soft, nontender without any shifting dullness or ascites. Musculoskeletal: No clubbing or cyanosis. Neurological: No motor or sensory deficits. Skin: No rashes or lesions.      Lab Results: Lab Results  Component Value Date   WBC 8.0 07/17/2019   HGB 13.4 07/17/2019   HCT 40.1  07/17/2019   MCV 94.6 07/17/2019   PLT 209 07/17/2019     Chemistry      Component Value Date/Time   NA 138 07/17/2019 0322   K 4.4 07/17/2019 0322   CL 105 07/17/2019 0322   CO2 27 07/17/2019 0322   BUN 13 07/17/2019 0322   CREATININE 1.01 07/17/2019 0322      Component Value Date/Time   CALCIUM 8.6 (L) 07/17/2019 0322      PSA on October 19 was 1.22, testosterone level of 88.3.  Impression and Plan:  64 year old man with:  1.    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    The natural course of his disease was reviewed and treatment options were discussed.  His PSA will be updated today and the risks and benefits of restarting androgen deprivation therapy was discussed.  The role of additional therapy such as Zytiga, Xtandi and systemic chemotherapy were reviewed.  His PSA did show decline associated with definitive radiation treatment with not fully castrate testosterone. The role of androgen deprivation therapy was discussed again he opted to defer that option. We will continue to monitor his PSA continues to rise future androgen deprivation therapy will be recommended.  2.  Follow-up: will be in 4 months for repeat follow-up.   30  minutes were spent on this visit.  The time was dedicated to reviewing his disease status, discussing treatment options and future plan of care reviewed.   Nathan Button, Berg 10/22/20218:54 AM

## 2020-04-26 LAB — PROSTATE-SPECIFIC AG, SERUM (LABCORP): Prostate Specific Ag, Serum: 0.9 ng/mL (ref 0.0–4.0)

## 2020-04-28 ENCOUNTER — Telehealth: Payer: Self-pay

## 2020-04-28 NOTE — Telephone Encounter (Signed)
-----   Message from Wyatt Portela, MD sent at 04/28/2020  8:57 AM EDT ----- Please let him know his PSA which is down.

## 2020-04-28 NOTE — Telephone Encounter (Signed)
Gave patient results per Dr Alen Blew. Patient verbalized understanding.

## 2020-08-12 ENCOUNTER — Telehealth: Payer: Self-pay | Admitting: Oncology

## 2020-08-12 NOTE — Telephone Encounter (Signed)
Called patient regarding 02/23 appointment, left a voicemail.

## 2020-08-27 ENCOUNTER — Other Ambulatory Visit: Payer: Self-pay

## 2020-08-27 ENCOUNTER — Inpatient Hospital Stay (HOSPITAL_BASED_OUTPATIENT_CLINIC_OR_DEPARTMENT_OTHER): Payer: Medicare Other | Admitting: Oncology

## 2020-08-27 ENCOUNTER — Inpatient Hospital Stay: Payer: Medicare Other | Attending: Oncology

## 2020-08-27 VITALS — BP 115/87 | HR 70 | Temp 97.6°F | Resp 16 | Ht 71.0 in | Wt 188.6 lb

## 2020-08-27 DIAGNOSIS — C61 Malignant neoplasm of prostate: Secondary | ICD-10-CM | POA: Insufficient documentation

## 2020-08-27 LAB — CBC WITH DIFFERENTIAL (CANCER CENTER ONLY)
Abs Immature Granulocytes: 0.01 10*3/uL (ref 0.00–0.07)
Basophils Absolute: 0.1 10*3/uL (ref 0.0–0.1)
Basophils Relative: 1 %
Eosinophils Absolute: 0.1 10*3/uL (ref 0.0–0.5)
Eosinophils Relative: 2 %
HCT: 43 % (ref 39.0–52.0)
Hemoglobin: 14.3 g/dL (ref 13.0–17.0)
Immature Granulocytes: 0 %
Lymphocytes Relative: 22 %
Lymphs Abs: 0.9 10*3/uL (ref 0.7–4.0)
MCH: 31.1 pg (ref 26.0–34.0)
MCHC: 33.3 g/dL (ref 30.0–36.0)
MCV: 93.5 fL (ref 80.0–100.0)
Monocytes Absolute: 0.5 10*3/uL (ref 0.1–1.0)
Monocytes Relative: 12 %
Neutro Abs: 2.6 10*3/uL (ref 1.7–7.7)
Neutrophils Relative %: 63 %
Platelet Count: 267 10*3/uL (ref 150–400)
RBC: 4.6 MIL/uL (ref 4.22–5.81)
RDW: 12 % (ref 11.5–15.5)
WBC Count: 4.2 10*3/uL (ref 4.0–10.5)
nRBC: 0 % (ref 0.0–0.2)

## 2020-08-27 LAB — CMP (CANCER CENTER ONLY)
ALT: 14 U/L (ref 0–44)
AST: 16 U/L (ref 15–41)
Albumin: 3.7 g/dL (ref 3.5–5.0)
Alkaline Phosphatase: 88 U/L (ref 38–126)
Anion gap: 5 (ref 5–15)
BUN: 18 mg/dL (ref 8–23)
CO2: 28 mmol/L (ref 22–32)
Calcium: 8.8 mg/dL — ABNORMAL LOW (ref 8.9–10.3)
Chloride: 104 mmol/L (ref 98–111)
Creatinine: 1.2 mg/dL (ref 0.61–1.24)
GFR, Estimated: >60 mL/min (ref >60)
Glucose, Bld: 103 mg/dL — ABNORMAL HIGH (ref 70–99)
Potassium: 4.5 mmol/L (ref 3.5–5.1)
Sodium: 137 mmol/L (ref 135–145)
Total Bilirubin: 0.5 mg/dL (ref 0.3–1.2)
Total Protein: 6.4 g/dL — ABNORMAL LOW (ref 6.5–8.1)

## 2020-08-27 NOTE — Progress Notes (Signed)
Hematology and Oncology Follow Up Visit  Nathan Berg [id number] 07/19/1955 65 y.o. 08/27/2020 9:32 AM [name], MDWilson, [name], MD   Principle Diagnosis: 65 year old with advanced prostate cancer with lymphadenopathy diagnosed in July 2021.  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  He was initially diagnosed with a Gleason score 4+3 = 7 and 2015 with PSA of 9.1.   Prior Therapy: He is s/p robotic assisted laparoscopic radical prostatectomy and bilateral lymph node dissection in March 2015.  The final pathological staging was T2CN0.  His Gleason score was 4+3 = 7 in both lobes without any evidence of angiolymphatic invasion or extraprostatic extension.      His PSA was up to 0.09 in October 2015.  He subsequently received salvage radiation under the care of Dr. Tammi Klippel after receiving 38.4 Pearline Cables in 38 fractions between May 06, 2014 and June 26, 2014.  He has received 6 months of androgen deprivation utilizing Lupron.  His a PSA was 0.086 in 2017 and the testosterone level was low at that time despite stopping androgen deprivation.  He subsequently started on testosterone replacement therapy with improvement in his testosterone level.  In 2018 his PSA remained low at 0.2 and subsequently 0.68.  In 2019 his PSA started to rise up to 0.94 in April 2019.  His PSA in October 2019 was 1.07, in December 2019 his PSA was 0.83, in July 2020 his PSA was 1.27, in October 2020 his PSA was 2.35 with a testosterone level of 245.  He has been off testosterone therapy.   PSA in March 2021 was 3.92 and in June 2021 was 8.18.  He was found to have oligometastatic disease.  He is status post radiation therapy between August 11 and March 02, 2020.  Right iliac crest and the right internal iliac lymph node.  Current therapy: Active surveillance.  Interim History: Mr. Soto presents today for routine evaluation.  Since last visit, he reports no major changes in his  health.  He has reports of occasional fatigue and tiredness and tremors since he has been off testosterone.  He has gained more weight although still reasonably active and attends activities of daily living.  Denies any bone pain or pathological fractures.  .    Medications: Updated on review. Current Outpatient Medications  Medication Sig Dispense Refill  . ALPRAZolam (XANAX) 1 MG tablet TAKE 1/2 TO 1 TABLET TWICE A DAY AS NEEDED FOR ANXIETY. Must have office or telemedicine visit for more.    Marland Kitchen buPROPion (WELLBUTRIN SR) 200 MG 12 hr tablet Take 200 mg by mouth 2 (two) times daily.    Marland Kitchen escitalopram (LEXAPRO) 20 MG tablet Take by mouth.    . traZODone (DESYREL) 100 MG tablet TAKE DAILY AS DIRECTED FOR INSOMNIA (Patient not taking: Reported on 04/01/2020)    . traZODone (DESYREL) 25 mg TABS tablet Take 25 mg by mouth at bedtime.     No current facility-administered medications for this visit.     Allergies:  Allergies  Allergen Reactions  . Codeine Nausea And Vomiting      Physical Exam:  Blood pressure 115/87, pulse 70, temperature 97.6 F (36.4 C), temperature source Tympanic, resp. rate 16, height 5\' 11"  (1.803 m), weight 188 lb 9.6 oz (85.5 kg), SpO2 97 %.    ECOG: 0   General appearance: Comfortable appearing without any discomfort Head: Normocephalic without any trauma Oropharynx: Mucous membranes are moist and pink without any thrush or ulcers. Eyes: Pupils are equal  and round reactive to light. Lymph nodes: No cervical, supraclavicular, inguinal or axillary lymphadenopathy.   Heart:regular rate and rhythm.  S1 and S2 without leg edema. Lung: Clear without any rhonchi or wheezes.  No dullness to percussion. Abdomin: Soft, nontender, nondistended with good bowel sounds.  No hepatosplenomegaly. Musculoskeletal: No joint deformity or effusion.  Full range of motion noted. Neurological: No deficits noted on motor, sensory and deep tendon reflex exam. Skin: No petechial  rash or dryness.  Appeared moist.        Lab Results: Lab Results  Component Value Date   WBC 3.9 (L) 04/25/2020   HGB 14.3 04/25/2020   HCT 42.7 04/25/2020   MCV 94.3 04/25/2020   PLT 237 04/25/2020     Chemistry      Component Value Date/Time   NA 138 04/25/2020 0841   K 4.5 04/25/2020 0841   CL 103 04/25/2020 0841   CO2 30 04/25/2020 0841   BUN 19 04/25/2020 0841   CREATININE 1.06 04/25/2020 0841      Component Value Date/Time   CALCIUM 9.3 04/25/2020 0841   ALKPHOS 85 04/25/2020 0841   AST 16 04/25/2020 0841   ALT 12 04/25/2020 0841   BILITOT 0.4 04/25/2020 0841      PSA on October 19 was 1.22, testosterone level of 88.3.  Impression and Plan:  65 year old man with:  1.    Advanced prostate cancer with oligometastatic pelvic lymph node involvement diagnosed in June 2021.  He has castration-sensitive disease.  Treatment options moving forward were reviewed at this time.  He received radiation therapy directed to his limited metastatic disease.  We will continue to monitor his PSA and consider adding androgen deprivation therapy which he opted against deviously.  Complication associated with androgen deprivation therapy as well as therapy escalation were discussed at this time.  He is to include weight gain and hot flashes among others.  Therapy escalation options including Taxotere chemotherapy, androgen synthesis inhibitors among others.  Complication associated with this therapy to include fatigue, weight gain, hypertension and myelosuppression associated with chemotherapy.  At this time, we opted to continue with active surveillance and monitor his PSA.  We will update his staging scans if his PSA starts to rise.  2.  Follow-up: In 4 months for repeat follow-up.   30  minutes were dedicated to this encounter.  The time was spent on reviewing laboratory data, disease status update, treatment options and complications noted to therapy.   Zola Button,  MD 2/23/20229:32 AM

## 2020-08-28 ENCOUNTER — Other Ambulatory Visit: Payer: Self-pay | Admitting: Oncology

## 2020-08-28 DIAGNOSIS — C61 Malignant neoplasm of prostate: Secondary | ICD-10-CM

## 2020-08-28 LAB — PROSTATE-SPECIFIC AG, SERUM (LABCORP): Prostate Specific Ag, Serum: 7.6 ng/mL — ABNORMAL HIGH (ref 0.0–4.0)

## 2020-08-28 LAB — TESTOSTERONE: Testosterone: 66 ng/dL — ABNORMAL LOW (ref 264–916)

## 2020-08-28 NOTE — Progress Notes (Signed)
The results of the PSA and testosterone levels were reviewed and discussed with the patient via phone.  We have recommended updating his staging scans and will obtain a PSMA PET scan.  Pending these results we will anticipate starting androgen deprivation therapy given the rise in his PSA.  He is agreeable with this plan.

## 2020-09-16 ENCOUNTER — Other Ambulatory Visit: Payer: Self-pay

## 2020-09-16 ENCOUNTER — Ambulatory Visit (HOSPITAL_COMMUNITY)
Admission: RE | Admit: 2020-09-16 | Discharge: 2020-09-16 | Disposition: A | Discharge status: Home / Self Care | Payer: Medicare Other | Source: Ambulatory Visit | Attending: Oncology | Admitting: Oncology

## 2020-09-16 DIAGNOSIS — C61 Malignant neoplasm of prostate: Secondary | ICD-10-CM | POA: Diagnosis present

## 2020-09-16 MED ORDER — PIFLIFOLASTAT F 18 (PYLARIFY) INJECTION
9.0000 | Freq: Once | INTRAVENOUS | Status: AC
  Administered 2020-09-16: 8.75 via INTRAVENOUS

## 2020-09-17 ENCOUNTER — Telehealth: Payer: Self-pay | Admitting: Oncology

## 2020-09-17 ENCOUNTER — Other Ambulatory Visit: Payer: Self-pay | Admitting: Oncology

## 2020-09-17 NOTE — Telephone Encounter (Signed)
Scheduled per 03/16 scheduled message, patient has been called and notified. 

## 2020-09-17 NOTE — Progress Notes (Signed)
The results of his PET scan were personally reviewed and discussed with Nathan Berg today via phone.  I recommended proceeding with androgen deprivation to start as soon as possible.  His testosterone at baseline is low and I do not anticipate any flare phenomenon at this time.  He will receive Eligard today and repeated in 4 months.  Additional therapy escalation will be discussed in future visits.  He is agreeable with this plan.

## 2020-09-18 ENCOUNTER — Other Ambulatory Visit: Payer: Self-pay

## 2020-09-18 ENCOUNTER — Inpatient Hospital Stay: Payer: Medicare Other | Attending: Oncology

## 2020-09-18 DIAGNOSIS — C61 Malignant neoplasm of prostate: Secondary | ICD-10-CM | POA: Diagnosis present

## 2020-09-18 DIAGNOSIS — Z5111 Encounter for antineoplastic chemotherapy: Secondary | ICD-10-CM | POA: Diagnosis present

## 2020-09-18 MED ORDER — LEUPROLIDE ACETATE (4 MONTH) 30 MG ~~LOC~~ KIT
30.0000 mg | PACK | Freq: Once | SUBCUTANEOUS | Status: AC
  Administered 2020-09-18: 30 mg via SUBCUTANEOUS

## 2020-09-18 MED ORDER — LEUPROLIDE ACETATE (4 MONTH) 30 MG ~~LOC~~ KIT
PACK | SUBCUTANEOUS | Status: AC
  Filled 2020-09-18: qty 30

## 2020-09-18 MED ORDER — LEUPROLIDE ACETATE (4 MONTH) 30 MG IM KIT
30.0000 mg | PACK | Freq: Once | INTRAMUSCULAR | Status: DC
  Filled 2020-09-18: qty 30

## 2020-09-18 NOTE — Patient Instructions (Signed)

## 2020-10-22 ENCOUNTER — Inpatient Hospital Stay: Payer: Medicare Other | Attending: Oncology

## 2020-10-24 ENCOUNTER — Inpatient Hospital Stay: Payer: Medicare Other | Admitting: Oncology

## 2020-10-24 ENCOUNTER — Telehealth: Payer: Self-pay | Admitting: *Deleted

## 2020-10-24 NOTE — Telephone Encounter (Signed)
PC to patient about missed appointment this a.m.  Per Dr. Alen Blew ok for patient to wait until June appointment to be seen.  Patient informed of June appointments, verbalizes understanding.

## 2020-12-24 ENCOUNTER — Other Ambulatory Visit: Payer: Self-pay

## 2020-12-24 ENCOUNTER — Inpatient Hospital Stay: Payer: Medicare Other | Attending: Oncology

## 2020-12-24 DIAGNOSIS — Z9079 Acquired absence of other genital organ(s): Secondary | ICD-10-CM | POA: Insufficient documentation

## 2020-12-24 DIAGNOSIS — Z923 Personal history of irradiation: Secondary | ICD-10-CM | POA: Diagnosis not present

## 2020-12-24 DIAGNOSIS — C7951 Secondary malignant neoplasm of bone: Secondary | ICD-10-CM | POA: Insufficient documentation

## 2020-12-24 DIAGNOSIS — C775 Secondary and unspecified malignant neoplasm of intrapelvic lymph nodes: Secondary | ICD-10-CM | POA: Insufficient documentation

## 2020-12-24 DIAGNOSIS — C61 Malignant neoplasm of prostate: Secondary | ICD-10-CM

## 2020-12-24 DIAGNOSIS — Z79899 Other long term (current) drug therapy: Secondary | ICD-10-CM | POA: Insufficient documentation

## 2020-12-24 DIAGNOSIS — E895 Postprocedural testicular hypofunction: Secondary | ICD-10-CM | POA: Insufficient documentation

## 2020-12-24 LAB — CBC WITH DIFFERENTIAL (CANCER CENTER ONLY)
Abs Immature Granulocytes: 0 10*3/uL (ref 0.00–0.07)
Basophils Absolute: 0 10*3/uL (ref 0.0–0.1)
Basophils Relative: 1 %
Eosinophils Absolute: 0.1 10*3/uL (ref 0.0–0.5)
Eosinophils Relative: 3 %
HCT: 38.7 % — ABNORMAL LOW (ref 39.0–52.0)
Hemoglobin: 13.2 g/dL (ref 13.0–17.0)
Immature Granulocytes: 0 %
Lymphocytes Relative: 26 %
Lymphs Abs: 1 10*3/uL (ref 0.7–4.0)
MCH: 31.1 pg (ref 26.0–34.0)
MCHC: 34.1 g/dL (ref 30.0–36.0)
MCV: 91.1 fL (ref 80.0–100.0)
Monocytes Absolute: 0.4 10*3/uL (ref 0.1–1.0)
Monocytes Relative: 10 %
Neutro Abs: 2.3 10*3/uL (ref 1.7–7.7)
Neutrophils Relative %: 60 %
Platelet Count: 221 10*3/uL (ref 150–400)
RBC: 4.25 MIL/uL (ref 4.22–5.81)
RDW: 12.3 % (ref 11.5–15.5)
WBC Count: 3.7 10*3/uL — ABNORMAL LOW (ref 4.0–10.5)
nRBC: 0 % (ref 0.0–0.2)

## 2020-12-24 LAB — CMP (CANCER CENTER ONLY)
ALT: 13 U/L (ref 0–44)
AST: 17 U/L (ref 15–41)
Albumin: 3.4 g/dL — ABNORMAL LOW (ref 3.5–5.0)
Alkaline Phosphatase: 87 U/L (ref 38–126)
Anion gap: 6 (ref 5–15)
BUN: 14 mg/dL (ref 8–23)
CO2: 28 mmol/L (ref 22–32)
Calcium: 8.7 mg/dL — ABNORMAL LOW (ref 8.9–10.3)
Chloride: 105 mmol/L (ref 98–111)
Creatinine: 1.18 mg/dL (ref 0.61–1.24)
GFR, Estimated: >60 mL/min (ref >60)
Glucose, Bld: 86 mg/dL (ref 70–99)
Potassium: 4.6 mmol/L (ref 3.5–5.1)
Sodium: 139 mmol/L (ref 135–145)
Total Bilirubin: 0.4 mg/dL (ref 0.3–1.2)
Total Protein: 6 g/dL — ABNORMAL LOW (ref 6.5–8.1)

## 2020-12-25 LAB — PROSTATE-SPECIFIC AG, SERUM (LABCORP): Prostate Specific Ag, Serum: 16.2 ng/mL — ABNORMAL HIGH (ref 0.0–4.0)

## 2020-12-25 LAB — TESTOSTERONE: Testosterone: <3 ng/dL — ABNORMAL LOW (ref 264–916)

## 2020-12-26 ENCOUNTER — Other Ambulatory Visit: Payer: Self-pay

## 2020-12-26 ENCOUNTER — Telehealth: Payer: Self-pay | Admitting: Pharmacist

## 2020-12-26 ENCOUNTER — Telehealth: Payer: Self-pay

## 2020-12-26 ENCOUNTER — Encounter: Payer: Self-pay | Admitting: Oncology

## 2020-12-26 ENCOUNTER — Other Ambulatory Visit (HOSPITAL_COMMUNITY): Payer: Self-pay

## 2020-12-26 ENCOUNTER — Inpatient Hospital Stay (HOSPITAL_BASED_OUTPATIENT_CLINIC_OR_DEPARTMENT_OTHER): Payer: Medicare Other | Admitting: Oncology

## 2020-12-26 VITALS — BP 107/66 | HR 77 | Temp 97.6°F | Resp 17 | Wt 194.8 lb

## 2020-12-26 DIAGNOSIS — C61 Malignant neoplasm of prostate: Secondary | ICD-10-CM

## 2020-12-26 MED ORDER — XTANDI 40 MG PO CAPS: 160.0000 mg | ORAL_CAPSULE | Freq: Every day | ORAL | 0 refills | Status: DC

## 2020-12-26 MED ORDER — XTANDI 40 MG PO CAPS
160.0000 mg | ORAL_CAPSULE | Freq: Every day | ORAL | 0 refills | Status: DC
  Filled 2020-12-26: qty 120, 30d supply, fill #0

## 2020-12-26 NOTE — Progress Notes (Signed)
Hematology and Oncology Follow Up Visit  Nathan Berg [id number] [birth year] 65 y.o. 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  He was noted in 2015 to have Gleason score 4+3 = 7 and 2015 with PSA of 9.1.   Prior Therapy: He is s/p robotic assisted laparoscopic radical prostatectomy and bilateral lymph node dissection in March 2015.  The final pathological staging was T2CN0.  His Gleason score was 4+3 = 7 in both lobes without any evidence of angiolymphatic invasion or extraprostatic extension.      His PSA was up to 0.09 in October 2015.  He subsequently received salvage radiation under the care of Dr. Tammi Klippel after receiving 38.4 Pearline Cables in 38 fractions between May 06, 2014 and June 26, 2014.  He has received 6 months of androgen deprivation utilizing Lupron.   His a PSA was 0.086 in 2017 and the testosterone level was low at that time despite stopping androgen deprivation.  He subsequently started on testosterone replacement therapy with improvement in his testosterone level.  In 2018 his PSA remained low at 0.2 and subsequently 0.68.  In 2019 his PSA started to rise up to 0.94 in April 2019.  His PSA in October 2019 was 1.07, in December 2019 his PSA was 0.83, in July 2020 his PSA was 1.27, in October 2020 his PSA was 2.35 with a testosterone level of 245.  He has been off testosterone therapy.   PSA in March 2021 was 3.92 and in June 2021 was 8.18.  He was found to have oligometastatic disease.  He is status post radiation therapy between August 11 and March 02, 2020.  Right iliac crest and the right internal iliac lymph node.  He developed metastasis based on pet imaging on September 16, 2020.  Current therapy: Eligard 30 mg started on September 18, 2020.  Interim History: [name] returns today for a follow-up visit.  Since our last visit, he  received Eligard with a few complaints.  He had denies any nausea, vomiting or abdominal pain but did report some occasional hot flashes and weight gain.  He reported some fatigue and some tiredness but no bone pain.  He denies any recent hospitalization or illnesses.  His performance status quality of life remained reasonable.  .    Medications: Unchanged on review. Current Outpatient Medications  Medication Sig Dispense Refill   ALPRAZolam (XANAX) 1 MG tablet TAKE 1/2 TO 1 TABLET TWICE A DAY AS NEEDED FOR ANXIETY. Must have office or telemedicine visit for more.     buPROPion (WELLBUTRIN SR) 200 MG 12 hr tablet Take 200 mg by mouth 2 (two) times daily.     escitalopram (LEXAPRO) 20 MG tablet Take by mouth.     traZODone (DESYREL) 100 MG tablet TAKE DAILY AS DIRECTED FOR INSOMNIA (Patient not taking: Reported on 04/01/2020)     traZODone (DESYREL) 25 mg TABS tablet Take 25 mg by mouth at bedtime.     No current facility-administered medications for this visit.     Allergies:  Allergies  Allergen Reactions   Codeine Nausea And Vomiting      Physical Exam:    Blood pressure 107/66, pulse 77, temperature 97.6 F (36.4 C), temperature source Oral, resp. rate 17, weight 194 lb 12.8 oz (88.4 kg), SpO2 99 %.   ECOG: 0     General appearance: Alert, awake without any distress. Head: Atraumatic  without abnormalities Oropharynx: Without any thrush or ulcers. Eyes: No scleral icterus. Lymph nodes: No lymphadenopathy noted in the cervical, supraclavicular, or axillary nodes Heart:regular rate and rhythm, without any murmurs or gallops.   Lung: Clear to auscultation without any rhonchi, wheezes or dullness to percussion. Abdomin: Soft, nontender without any shifting dullness or ascites. Musculoskeletal: No clubbing or cyanosis. Neurological: No motor or sensory deficits. Skin: No rashes or lesions. Psychiatric: Mood and affect appeared normal.       Lab Results: Lab  Results  Component Value Date   WBC 3.7 (L) 12/24/2020   HGB 13.2 12/24/2020   HCT 38.7 (L) 12/24/2020   MCV 91.1 12/24/2020   PLT 221 12/24/2020     Chemistry      Component Value Date/Time   NA 139 12/24/2020 1116   K 4.6 12/24/2020 1116   CL 105 12/24/2020 1116   CO2 28 12/24/2020 1116   BUN 14 12/24/2020 1116   CREATININE 1.18 12/24/2020 1116      Component Value Date/Time   CALCIUM 8.7 (L) 12/24/2020 1116   ALKPHOS 87 12/24/2020 1116   AST 17 12/24/2020 1116   ALT 13 12/24/2020 1116   BILITOT 0.4 12/24/2020 1116       Results for TAHJ, Berg (MRN [id number]) as of 12/26/2020 13:18  Ref. Range 08/27/2020 09:40 12/24/2020 11:16  Prostate Specific Ag, Serum Latest Ref Range: 0.0 - 4.0 ng/mL 7.6 (H) 16.2 (H)     Impression and Plan:  65 year old man with:   1.    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  He is currently on androgen deprivation therapy alone with under consideration to start therapy escalation.  His PSA on December 24, 2020 was 16.2 compared to 7.6 prior to hormonal therapy.  Rationale for his PSA rise despite a castrate level testosterone was discussed.  It is likely that he is developed castration hyper resistant disease rather quickly given the low testosterone state that he had for prolonged period of time.     Therapy escalation including androgen receptor pathway inhibitors versus systemic chemotherapy were discussed at this time.  The option of Zytiga, Xtandi, and other oral agents were reviewed.  Complication associated with oral therapy includes hypertension, fatigue and adrenal insufficiency.  Complication associated with chemotherapy including worsening neuropathy, nausea, vomiting and myelosuppression.  After discussion today, we agreed to proceed with Xtandi 160 mg daily pending his insurance coverage as well as drug interactions.   2.  Androgen deprivation therapy: I  recommended continuing this indefinitely every 4 months.  Next injection will be given in July 2022.   3.  Bone directed therapy: Recommended calcium and vitamin D supplements.  Delton See will be considered after obtaining dental clearance.   4.   Follow-up: In the next 4 to 6 weeks for repeat follow-up.     30  minutes were spent on this visit.  The time was dedicated to reviewing disease status, reviewing laboratory data, discussing treatment choices as well as complications related to his therapy.   Zola Button, MD 6/24/20221:16 PM

## 2020-12-26 NOTE — Telephone Encounter (Signed)
Oral Oncology Patient Advocate Encounter  Prior Authorization for Nathan Berg has been approved.    PA# [id number] Effective dates: 11/26/20 through 12/26/21   Oral Oncology Clinic will continue to follow.   [place] Patient [place] Phone [phone] Fax [phone] 12/26/2020 3:13 PM

## 2020-12-26 NOTE — Telephone Encounter (Signed)
Oral Oncology Patient Advocate Encounter   Received notification from [employer] that prior authorization for [name] is required.   PA submitted on CoverMyMeds Key [id number] Status is pending   Oral Oncology Clinic will continue to follow.   [place] Patient [place] Phone [phone] Fax [phone] 12/26/2020 2:09 PM

## 2020-12-26 NOTE — Telephone Encounter (Addendum)
Oral Oncology Pharmacist Encounter  Received new prescription for Xtandi (enzalutamide) for the treatment of metastatic castration sensitive prostate cancer in conjunction with ADT, planned duration until disease progression or unacceptable drug toxicity.  Prescription dose and frequency assessed for appropriateness. Appropriate for therapy initiation.   CBC w/ Diff and CMP from 12/24/20 assessed, no dose adjustments required at this time.  Current medication list in Epic reviewed, DDIs with Xtandi identified: Category D DDI between [place] and Trazodone - Xtandi a strong CYP3A4 inducer can decrease efficacy of trazodone. Patient will need to be monitored for reduced efficacy of trazodone while on treatment.  Category C DDI between Xtandi and Escitalopram as well as Xanax due to possible decreased efficacy of escitalopram and Xanax due to CYP3A4 induction from [place]. Recommend monitoring patient for reduced efficacy of this agents once therapy is started.   Evaluated chart and no patient barriers to medication adherence noted.   Patient agreement for treatment documented in MD note on 12/26/20.  Patient's insurance does not allow Rx to be filled through [facility]. Will redirect to [place] for dispensing.   Oral Oncology Clinic will continue to follow for insurance authorization, copayment issues, initial counseling and start date.  Leron Croak, PharmD, BCPS Hematology/Oncology Clinical Pharmacist [facility] [phone] 12/26/2020 3:21 PM

## 2020-12-29 MED ORDER — ENZALUTAMIDE 40 MG PO TABS: 160.0000 mg | ORAL_TABLET | Freq: Every day | ORAL | 0 refills | Status: DC

## 2020-12-29 NOTE — Telephone Encounter (Signed)
Oral Chemotherapy Pharmacist Encounter   Spoke with patient today to follow up regarding patient's oral chemotherapy medication: Xtandi (enzalutamide)  Discussed with patient that prescription is required to be filled through [facility] (tele: [phone]) and that he will need to call the specialty pharmacy for them to tell him the copay of the medication. Discussed with patient that there is a Metallurgist for [place] that we can apply for if copay is unaffordable for patient.   Patient knows to call the office with questions or concerns. Patient provided with Oral Chemotherapy Clinic phone number.   Leron Croak, PharmD, BCPS Hematology/Oncology Clinical Pharmacist [facility] [phone] 12/29/2020 3:04 PM

## 2021-01-01 ENCOUNTER — Telehealth: Payer: Self-pay

## 2021-01-01 NOTE — Telephone Encounter (Signed)
Oral Oncology Patient Advocate Encounter  Met patient in lobby room to complete application for [employer] in an effort to reduce patient's out of pocket expense for Xtandi to $0.    Application completed and faxed to [phone] .   Xtandi  patient assistance phone number for follow up is [phone].   This encounter will be updated until final determination.      [facility] Patient Advocate [facility] Phone [phone] Fax [phone] 01/01/2021 1:50 PM  

## 2021-01-09 ENCOUNTER — Other Ambulatory Visit: Payer: Self-pay | Admitting: Oncology

## 2021-01-09 ENCOUNTER — Other Ambulatory Visit (HOSPITAL_COMMUNITY): Payer: Self-pay

## 2021-01-09 MED ORDER — PREDNISONE 5 MG PO TABS: 5.0000 mg | ORAL_TABLET | Freq: Every day | ORAL | 3 refills | Status: DC

## 2021-01-09 MED ORDER — ABIRATERONE ACETATE 250 MG PO TABS
1000.0000 mg | ORAL_TABLET | Freq: Every day | ORAL | 0 refills | Status: DC
  Filled 2021-01-09: qty 120, 30d supply, fill #0

## 2021-01-09 MED ORDER — PREDNISONE 5 MG PO TABS
5.0000 mg | ORAL_TABLET | Freq: Every day | ORAL | 3 refills | Status: DC
  Filled 2021-01-09: qty 30, 30d supply, fill #0

## 2021-01-09 NOTE — Telephone Encounter (Addendum)
Oral Oncology Pharmacist Encounter  Due to delay in processing of manufacturer assistance application for [facility], patient will be starting initially on Zytiga (abiraterone acetate) in conjunction with prednisone and ADT for the treatment of metastatic castration sensitive prostate cancer, planned duration until either patient is approved for manufacturer assistance at no cost for [facility], or if patient is not approved, will proceed with manufacturer assistance process for Zytiga at that time.   Prescription dose and frequency assessed for appropriateness. Appropriate for therapy initiation. CBC w/ Diff and CMP from 12/24/20 assessed, no dose adjustments required at this time.  Current medication list in Epic reviewed, no relevant/significant DDIs with Zytiga identified.  Counseled patient on administration, dosing, side effects, monitoring, drug-food interactions, safe handling, storage, and disposal.  Patient will take Zytiga 250mg  tablets, 4 tablets (1000mg ) by mouth once daily on an empty stomach, 1 hour before or 2 hours after a meal.  Patient states he will take his Zytiga in the morning and will wait at least 1 hour before eating.  Patient will take prednisone 5mg  tablet, 1 tablet by mouth one daily with breakfast.  Zytiga start date: 01/10/21  Adverse effects include but are not limited to: peripheral edema, GI upset, hypertension, hot flashes, fatigue, and arthralgias.    Prednisone prescription has been sent to [facility] per patient request. Patient will obtain prednisone and knows to start prednisone on the same day as Zytiga start.  Reviewed with patient importance of keeping a medication schedule and plan for any missed doses. No barriers to medication adherence identified.  Medication reconciliation performed and medication/allergy list updated.  All questions answered.  Mr. Kock voiced understanding and appreciation.   Medication education handout given to patient.  Patient knows to call the office with questions or concerns. Oral Chemotherapy Clinic phone number provided to patient.   Leron Croak, PharmD, BCPS Hematology/Oncology Clinical Pharmacist [facility] [phone] 01/09/2021 12:05 PM

## 2021-01-14 ENCOUNTER — Other Ambulatory Visit (HOSPITAL_COMMUNITY): Payer: Self-pay

## 2021-01-14 ENCOUNTER — Telehealth: Payer: Self-pay

## 2021-01-14 ENCOUNTER — Encounter: Payer: Self-pay | Admitting: Oncology

## 2021-01-14 NOTE — Telephone Encounter (Signed)
Oral Oncology Patient Advocate Encounter   Was successful in securing patient an [phone] grant from Patient [place] [facility]) to provide copayment coverage for Xtandi.  This will keep the out of pocket expense at $0.     I have spoken with the patient.    The billing information is as follows and has been shared with [place].   Member ID: [id number]  Group ID: [id number] RxBin: [id number] Dates of Eligibility: 10/16/20 through 01/13/22  Fund:  [place] Patient [place] Phone [phone] Fax [phone] 01/14/2021 1:22 PM

## 2021-01-15 ENCOUNTER — Other Ambulatory Visit (HOSPITAL_COMMUNITY): Payer: Self-pay

## 2021-01-19 ENCOUNTER — Telehealth: Payer: Self-pay

## 2021-01-19 ENCOUNTER — Other Ambulatory Visit (HOSPITAL_COMMUNITY): Payer: Self-pay

## 2021-01-19 ENCOUNTER — Encounter: Payer: Self-pay | Admitting: Oncology

## 2021-01-19 NOTE — Telephone Encounter (Signed)
Oral Oncology Patient Advocate Encounter  Was successful in securing patient a $8000 grant from [employer] to provide copayment coverage for Nathan Berg.  This will keep the out of pocket expense at $0.     [employer] ID: [id number]  I have spoken with the patient.   The billing information is as follows and has been shared with [place]: [id number] PCN: [id number] Member ID: [id number] Group ID: [id number] Dates of Eligibility: 12/20/20 through 12/19/21  Fund:  [place] Patient Nathan Berg Phone [phone] Fax [phone] 01/19/2021 9:54 AM

## 2021-01-19 NOTE — Telephone Encounter (Signed)
Oral Oncology Patient Advocate Encounter   Was successful in securing patient a $3500 grant from Patient [place] (PAF) to provide copayment coverage for Xtandi.  This will keep the out of pocket expense at $0.     I have spoken with the patient.    The billing information is as follows and has been shared with [place].   RxBin: [id number] PCN:  [id number] Member ID: [id number] Group ID: [id number] Dates of Eligibility: 01/15/21 through 01/15/22  [place] Patient [place] Phone [phone] Fax [phone] 01/19/2021 10:51 AM

## 2021-01-22 ENCOUNTER — Other Ambulatory Visit: Payer: Self-pay | Admitting: Pharmacist

## 2021-01-22 NOTE — Telephone Encounter (Signed)
Oral Chemotherapy Pharmacist Encounter   Spoke with patient today to follow up regarding patient's oral chemotherapy medication: Xtandi (enzalutamide)  Patient states he has 2 more weeks of Zytiga sample and then understands he will switch over to [place] at that time. Patient provided phone number to [place] [phone]) to set up first shipment of Xtandi. Patient informed that a Environmental education officer for $3500 was obtained for him and information was shared with [facility] which should make patient's first out of pocket cost $0 for first fill of Xtandi.   Patient knows to call the office with questions or concerns.  Leron Croak, PharmD, BCPS Hematology/Oncology Clinical Pharmacist [facility] [phone] 01/22/2021 2:07 PM

## 2021-01-28 NOTE — Telephone Encounter (Signed)
Oral Chemotherapy Pharmacist Encounter  I spoke with patient for overview of: Xtandi for the treatment of metastatic, castration-sensitive prostate cancer in conjunction with ADT, planned duration until disease progression or unacceptable toxicity.   Counseled patient on administration, dosing, side effects, monitoring, drug-food interactions, safe handling, storage, and disposal.  Patient will take Xtandi '40mg'$  tablets, 4 tablets ('160mg'$ ) by mouth once daily without regard to food.  Patient knows to stop the Zytiga and prednisone prior to starting Xtandi.   Xtandi start date: patient will start once received from manufacturer assistance program, this will likely be shipped to patient in the next 3-5 business days  Adverse effects include but are not limited to: peripheral edema, GI upset, hypertension, hot flashes, fatigue, falls/fractures, and arthralgias.   Patient instructed about small risk of seizures with Xtandi treatment.  Reviewed with patient importance of keeping a medication schedule and plan for any missed doses. No barriers to medication adherence identified.  Medication reconciliation performed and medication/allergy list updated.  Insurance authorization for [name] has been obtained. Due to high copay, patient has been approved for manufacturer assistance through [employer]. The medication will be shipped from [facility].   All questions answered.  Nathan Berg voiced understanding and appreciation.   Medication education handout placed in mail for patient. Patient knows to call the office with questions or concerns. Oral Chemotherapy Clinic phone number provided to patient.   Leron Croak, PharmD, BCPS Hematology/Oncology Clinical Pharmacist [facility] [phone] 01/28/2021 10:18 AM

## 2021-01-28 NOTE — Telephone Encounter (Signed)
Patient is approved for Xtandi at no charge from [employer] 01/28/21-07/04/21.  Nathan Berg uses [facility] [place] Phone [phone] Fax [phone] 01/28/2021 9:09 AM

## 2021-02-03 ENCOUNTER — Other Ambulatory Visit: Payer: Self-pay

## 2021-02-03 ENCOUNTER — Inpatient Hospital Stay: Payer: Medicare Other | Attending: Oncology

## 2021-02-03 DIAGNOSIS — C7951 Secondary malignant neoplasm of bone: Secondary | ICD-10-CM | POA: Diagnosis not present

## 2021-02-03 DIAGNOSIS — C61 Malignant neoplasm of prostate: Secondary | ICD-10-CM | POA: Diagnosis not present

## 2021-02-03 LAB — CMP (CANCER CENTER ONLY)
ALT: 21 U/L (ref 0–44)
AST: 19 U/L (ref 15–41)
Albumin: 3.5 g/dL (ref 3.5–5.0)
Alkaline Phosphatase: 92 U/L (ref 38–126)
Anion gap: 9 (ref 5–15)
BUN: 22 mg/dL (ref 8–23)
CO2: 24 mmol/L (ref 22–32)
Calcium: 8.8 mg/dL — ABNORMAL LOW (ref 8.9–10.3)
Chloride: 108 mmol/L (ref 98–111)
Creatinine: 1.05 mg/dL (ref 0.61–1.24)
GFR, Estimated: >60 mL/min (ref >60)
Glucose, Bld: 112 mg/dL — ABNORMAL HIGH (ref 70–99)
Potassium: 4.6 mmol/L (ref 3.5–5.1)
Sodium: 141 mmol/L (ref 135–145)
Total Bilirubin: 0.4 mg/dL (ref 0.3–1.2)
Total Protein: 6.3 g/dL — ABNORMAL LOW (ref 6.5–8.1)

## 2021-02-03 LAB — CBC WITH DIFFERENTIAL (CANCER CENTER ONLY)
Abs Immature Granulocytes: 0.02 10*3/uL (ref 0.00–0.07)
Basophils Absolute: 0 10*3/uL (ref 0.0–0.1)
Basophils Relative: 1 %
Eosinophils Absolute: 0 10*3/uL (ref 0.0–0.5)
Eosinophils Relative: 1 %
HCT: 39.7 % (ref 39.0–52.0)
Hemoglobin: 13.4 g/dL (ref 13.0–17.0)
Immature Granulocytes: 1 %
Lymphocytes Relative: 17 %
Lymphs Abs: 0.8 10*3/uL (ref 0.7–4.0)
MCH: 31.2 pg (ref 26.0–34.0)
MCHC: 33.8 g/dL (ref 30.0–36.0)
MCV: 92.3 fL (ref 80.0–100.0)
Monocytes Absolute: 0.3 10*3/uL (ref 0.1–1.0)
Monocytes Relative: 8 %
Neutro Abs: 3.2 10*3/uL (ref 1.7–7.7)
Neutrophils Relative %: 72 %
Platelet Count: 232 10*3/uL (ref 150–400)
RBC: 4.3 MIL/uL (ref 4.22–5.81)
RDW: 12.7 % (ref 11.5–15.5)
WBC Count: 4.4 10*3/uL (ref 4.0–10.5)
nRBC: 0 % (ref 0.0–0.2)

## 2021-02-04 ENCOUNTER — Telehealth: Payer: Self-pay | Admitting: Oncology

## 2021-02-04 LAB — TESTOSTERONE: Testosterone: <3 ng/dL — ABNORMAL LOW (ref 264–916)

## 2021-02-04 LAB — PROSTATE-SPECIFIC AG, SERUM (LABCORP): Prostate Specific Ag, Serum: 10.5 ng/mL — ABNORMAL HIGH (ref 0.0–4.0)

## 2021-02-04 NOTE — Telephone Encounter (Signed)
Called patient regarding upcoming appointment, patient is notified. °

## 2021-02-10 ENCOUNTER — Other Ambulatory Visit: Payer: Self-pay

## 2021-02-10 ENCOUNTER — Inpatient Hospital Stay (HOSPITAL_BASED_OUTPATIENT_CLINIC_OR_DEPARTMENT_OTHER): Payer: Medicare Other | Admitting: Oncology

## 2021-02-10 VITALS — BP 122/82 | HR 72 | Temp 98.5°F | Resp 18 | Ht 71.0 in | Wt 201.3 lb

## 2021-02-10 DIAGNOSIS — C61 Malignant neoplasm of prostate: Secondary | ICD-10-CM

## 2021-02-10 NOTE — Progress Notes (Signed)
Hematology and Oncology Follow Up Visit  YABDIEL ANTE [id number] 17-Oct-1955 65 y.o. 02/10/2021 3:36 PM [name], MDWilson, [name], MD   Principle Diagnosis: 65 year old man with prostate cancer diagnosed with localized disease in 2015.  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  He presented with Gleason score 4+3 = 7 PSA of 9.1 in 2015.   Prior Therapy: He is s/p robotic assisted laparoscopic radical prostatectomy and bilateral lymph node dissection in March 2015.  The final pathological staging was T2CN0.  His Gleason score was 4+3 = 7 in both lobes without any evidence of angiolymphatic invasion or extraprostatic extension.      His PSA was up to 0.09 in October 2015.  He subsequently received salvage radiation under the care of Dr. Tammi Klippel after receiving 38.4 Pearline Cables in 38 fractions between May 06, 2014 and June 26, 2014.  He has received 6 months of androgen deprivation utilizing Lupron.   His a PSA was 0.086 in 2017 and the testosterone level was low at that time despite stopping androgen deprivation.  He subsequently started on testosterone replacement therapy with improvement in his testosterone level.  In 2018 his PSA remained low at 0.2 and subsequently 0.68.  In 2019 his PSA started to rise up to 0.94 in April 2019.  His PSA in October 2019 was 1.07, in December 2019 his PSA was 0.83, in July 2020 his PSA was 1.27, in October 2020 his PSA was 2.35 with a testosterone level of 245.  He has been off testosterone therapy.   PSA in March 2021 was 3.92 and in June 2021 was 8.18.  He was found to have oligometastatic disease.  He is status post radiation therapy between August 11 and March 02, 2020.  Right iliac crest and the right internal iliac lymph node.  He developed metastasis based on pet imaging on September 16, 2020.  Zytiga 1000 mg with prednisone given in July 2022 while he is obtaining coverage for Xtandi.    Current therapy:    Eligard 30 mg started on September 18, 2020 every 4 months.  Next injection will be given in the near future.  Xtandi 160 mg daily started in August 2022.  Interim History: Mr. Espejel presents today for repeat evaluation.  Since the last visit, he started Zytiga and prednisone while awaiting approval for [facility] which he has received recently.  He has tolerated both medication without any complications.  He does report some mild fatigue and tiredness and weight gain but otherwise no other complaints.  He denies any bone pain or pathological fractures.  He denies any hospitalization or illnesses.  He has reported significant fatigue however and and ability to walk for an extended period of time.  .    Medications: Reviewed without changes. Current Outpatient Medications  Medication Sig Dispense Refill   ALPRAZolam (XANAX) 1 MG tablet TAKE 1/2 TO 1 TABLET TWICE A DAY AS NEEDED FOR ANXIETY. Must have office or telemedicine visit for more.     buPROPion (WELLBUTRIN SR) 200 MG 12 hr tablet Take 200 mg by mouth 2 (two) times daily.     enzalutamide (XTANDI) 40 MG tablet Take 4 tablets (160 mg total) by mouth daily. 120 tablet 0   escitalopram (LEXAPRO) 20 MG tablet Take by mouth.     traZODone (DESYREL) 100 MG tablet TAKE DAILY AS DIRECTED FOR INSOMNIA (Patient not taking: Reported on 04/01/2020)     traZODone (DESYREL) 25 mg TABS tablet  Take 25 mg by mouth at bedtime.     No current facility-administered medications for this visit.     Allergies:  Allergies  Allergen Reactions   Codeine Nausea And Vomiting      Physical Exam:   Blood pressure 122/82, pulse 72, temperature 98.5 F (36.9 C), temperature source Oral, resp. rate 18, height '5\' 11"'$  (1.803 m), weight 201 lb 4.8 oz (91.3 kg), SpO2 96 %.     ECOG: 0    General appearance: Comfortable appearing without any discomfort Head: Normocephalic without any trauma Oropharynx: Mucous membranes are moist and  pink without any thrush or ulcers. Eyes: Pupils are equal and round reactive to light. Lymph nodes: No cervical, supraclavicular, inguinal or axillary lymphadenopathy.   Heart:regular rate and rhythm.  S1 and S2 without leg edema. Lung: Clear without any rhonchi or wheezes.  No dullness to percussion. Abdomin: Soft, nontender, nondistended with good bowel sounds.  No hepatosplenomegaly. Musculoskeletal: No joint deformity or effusion.  Full range of motion noted. Neurological: No deficits noted on motor, sensory and deep tendon reflex exam. Skin: No petechial rash or dryness.  Appeared moist.         Lab Results: Lab Results  Component Value Date   WBC 4.4 02/03/2021   HGB 13.4 02/03/2021   HCT 39.7 02/03/2021   MCV 92.3 02/03/2021   PLT 232 02/03/2021     Chemistry      Component Value Date/Time   NA 141 02/03/2021 1304   K 4.6 02/03/2021 1304   CL 108 02/03/2021 1304   CO2 24 02/03/2021 1304   BUN 22 02/03/2021 1304   CREATININE 1.05 02/03/2021 1304      Component Value Date/Time   CALCIUM 8.8 (L) 02/03/2021 1304   ALKPHOS 92 02/03/2021 1304   AST 19 02/03/2021 1304   ALT 21 02/03/2021 1304   BILITOT 0.4 02/03/2021 1304           Impression and Plan:  65 year old man with:   1.   Advanced prostate cancer with disease to the bone and lymphadenopathy diagnosed in March 2022.  He developed castration-resistant disease quickly.  He has tolerated Xtandi without any major complications for the last few days after close to 4 weeks of Zytiga.  His PSA showed an excellent response and overall reasonable tolerance at this time.  Risks and benefits of continuing this treatment were discussed.  Complications that include nausea, fatigue and weight gain and hypertension were discussed.  For the time being he is agreeable to continue and plan to update his staging scans before the next visit.   2.  Androgen deprivation therapy: Testosterone level remains very low.  We  will restart Lupron with the next visit and repeat every 4 months.   3.  Bone directed therapy: Risks and benefits of Xgeva were discussed.  This will be deferred for the time being.  4.  Spinal metastasis: Asymptomatic at this time we will continue to monitor with possibly repeat radiation if he develops painful involvement.   5.   Follow-up: In 6 weeks for repeat evaluation.     30  minutes were spent on this encounter.  Time was dedicated to reviewing laboratory data, disease status update, treatment choices and addressing complications related to cancer and cancer therapy.   Zola Button, MD 8/9/20223:36 PM

## 2021-02-11 ENCOUNTER — Encounter: Payer: Self-pay | Admitting: Oncology

## 2021-02-20 ENCOUNTER — Telehealth: Payer: Self-pay | Admitting: Oncology

## 2021-02-20 NOTE — Telephone Encounter (Signed)
Scheduled per 08/09 los, patient has been called and notified. 

## 2021-03-10 ENCOUNTER — Other Ambulatory Visit: Payer: Self-pay

## 2021-03-10 ENCOUNTER — Other Ambulatory Visit: Payer: Self-pay | Admitting: *Deleted

## 2021-03-10 ENCOUNTER — Inpatient Hospital Stay: Payer: Medicare Other | Attending: Oncology

## 2021-03-10 ENCOUNTER — Other Ambulatory Visit (HOSPITAL_COMMUNITY): Payer: Self-pay

## 2021-03-10 DIAGNOSIS — C61 Malignant neoplasm of prostate: Secondary | ICD-10-CM

## 2021-03-10 DIAGNOSIS — F32A Depression, unspecified: Secondary | ICD-10-CM | POA: Insufficient documentation

## 2021-03-10 DIAGNOSIS — C7951 Secondary malignant neoplasm of bone: Secondary | ICD-10-CM | POA: Insufficient documentation

## 2021-03-10 DIAGNOSIS — F419 Anxiety disorder, unspecified: Secondary | ICD-10-CM | POA: Insufficient documentation

## 2021-03-10 LAB — CMP (CANCER CENTER ONLY)
ALT: 22 U/L (ref 0–44)
AST: 20 U/L (ref 15–41)
Albumin: 3.6 g/dL (ref 3.5–5.0)
Alkaline Phosphatase: 90 U/L (ref 38–126)
Anion gap: 8 (ref 5–15)
BUN: 15 mg/dL (ref 8–23)
CO2: 26 mmol/L (ref 22–32)
Calcium: 8.9 mg/dL (ref 8.9–10.3)
Chloride: 106 mmol/L (ref 98–111)
Creatinine: 0.99 mg/dL (ref 0.61–1.24)
GFR, Estimated: >60 mL/min (ref >60)
Glucose, Bld: 94 mg/dL (ref 70–99)
Potassium: 4.8 mmol/L (ref 3.5–5.1)
Sodium: 140 mmol/L (ref 135–145)
Total Bilirubin: 0.4 mg/dL (ref 0.3–1.2)
Total Protein: 6.4 g/dL — ABNORMAL LOW (ref 6.5–8.1)

## 2021-03-10 LAB — CBC WITH DIFFERENTIAL (CANCER CENTER ONLY)
Abs Immature Granulocytes: 0.01 10*3/uL (ref 0.00–0.07)
Basophils Absolute: 0.1 10*3/uL (ref 0.0–0.1)
Basophils Relative: 1 %
Eosinophils Absolute: 0.2 10*3/uL (ref 0.0–0.5)
Eosinophils Relative: 4 %
HCT: 40.9 % (ref 39.0–52.0)
Hemoglobin: 13.7 g/dL (ref 13.0–17.0)
Immature Granulocytes: 0 %
Lymphocytes Relative: 21 %
Lymphs Abs: 0.9 10*3/uL (ref 0.7–4.0)
MCH: 30.8 pg (ref 26.0–34.0)
MCHC: 33.5 g/dL (ref 30.0–36.0)
MCV: 91.9 fL (ref 80.0–100.0)
Monocytes Absolute: 0.5 10*3/uL (ref 0.1–1.0)
Monocytes Relative: 11 %
Neutro Abs: 2.6 10*3/uL (ref 1.7–7.7)
Neutrophils Relative %: 63 %
Platelet Count: 247 10*3/uL (ref 150–400)
RBC: 4.45 MIL/uL (ref 4.22–5.81)
RDW: 12.2 % (ref 11.5–15.5)
WBC Count: 4.2 10*3/uL (ref 4.0–10.5)
nRBC: 0 % (ref 0.0–0.2)

## 2021-03-10 MED ORDER — ENZALUTAMIDE 40 MG PO TABS: 160.0000 mg | ORAL_TABLET | Freq: Every day | ORAL | 0 refills | Status: DC

## 2021-03-11 ENCOUNTER — Other Ambulatory Visit: Payer: Self-pay | Admitting: Oncology

## 2021-03-11 ENCOUNTER — Encounter: Payer: Self-pay | Admitting: Oncology

## 2021-03-11 DIAGNOSIS — C61 Malignant neoplasm of prostate: Secondary | ICD-10-CM

## 2021-03-11 LAB — TESTOSTERONE: Testosterone: <3 ng/dL — ABNORMAL LOW (ref 264–916)

## 2021-03-11 LAB — PROSTATE-SPECIFIC AG, SERUM (LABCORP): Prostate Specific Ag, Serum: 7.5 ng/mL — ABNORMAL HIGH (ref 0.0–4.0)

## 2021-03-12 ENCOUNTER — Other Ambulatory Visit: Payer: Self-pay

## 2021-03-12 ENCOUNTER — Encounter (HOSPITAL_COMMUNITY)
Admission: RE | Admit: 2021-03-12 | Discharge: 2021-03-12 | Disposition: A | Discharge status: Home / Self Care | Payer: Medicare Other | Source: Ambulatory Visit | Attending: Oncology | Admitting: Oncology

## 2021-03-12 DIAGNOSIS — C61 Malignant neoplasm of prostate: Secondary | ICD-10-CM | POA: Diagnosis not present

## 2021-03-12 MED ORDER — PIFLIFOLASTAT F 18 (PYLARIFY) INJECTION
9.0000 | Freq: Once | INTRAVENOUS | Status: AC
  Administered 2021-03-12: 9.74 via INTRAVENOUS

## 2021-03-17 ENCOUNTER — Inpatient Hospital Stay (HOSPITAL_BASED_OUTPATIENT_CLINIC_OR_DEPARTMENT_OTHER): Payer: Medicare Other | Admitting: Oncology

## 2021-03-17 ENCOUNTER — Other Ambulatory Visit: Payer: Self-pay

## 2021-03-17 VITALS — BP 117/73 | HR 91 | Temp 97.7°F | Resp 18 | Wt 197.0 lb

## 2021-03-17 DIAGNOSIS — C61 Malignant neoplasm of prostate: Secondary | ICD-10-CM

## 2021-03-17 NOTE — Progress Notes (Signed)
Hematology and Oncology Follow Up Visit  Nathan Berg [id number] 21-Jun-1956 65 y.o. 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  He presented with Gleason score 4+3 = 7 PSA of 9.1 in 2015 with localized disease.   Prior Therapy: He is s/p robotic assisted laparoscopic radical prostatectomy and bilateral lymph node dissection in March 2015.  The final pathological staging was T2CN0.  His Gleason score was 4+3 = 7 in both lobes without any evidence of angiolymphatic invasion or extraprostatic extension.      His PSA was up to 0.09 in October 2015.  He subsequently received salvage radiation under the care of Dr. Tammi Berg after receiving 38.4 Pearline Cables in 38 fractions between May 06, 2014 and June 26, 2014.  He has received 6 months of androgen deprivation utilizing Lupron.   His a PSA was 0.086 in 2017 and the testosterone level was low at that time despite stopping androgen deprivation.  He subsequently started on testosterone replacement therapy with improvement in his testosterone level.  In 2018 his PSA remained low at 0.2 and subsequently 0.68.  In 2019 his PSA started to rise up to 0.94 in April 2019.  His PSA in October 2019 was 1.07, in December 2019 his PSA was 0.83, in July 2020 his PSA was 1.27, in October 2020 his PSA was 2.35 with a testosterone level of 245.  He has been off testosterone therapy.   PSA in March 2021 was 3.92 and in June 2021 was 8.18.  He was found to have oligometastatic disease.  He is status post radiation therapy between August 11 and March 02, 2020.  Right iliac crest and the right internal iliac lymph node.  He developed metastasis based on pet imaging on September 16, 2020.  Zytiga 1000 mg with prednisone given in July 2022 while he is obtaining coverage for Xtandi.   Current therapy:    Eligard 30 mg started on September 18, 2020  every 4 months.  Next injection will be given in the near future.  Xtandi 160 mg daily started in August 2022.  Interim History: [name] returns today for a follow-up visit.  Since her last visit, he reports no major changes in his health.  He denies any recent hospitalizations or illnesses.  He denies any bone pain or pathological fractures.  He has reported few complications of related to [facility] including incontinence and occasional bouts of anxiety.  .    Medications: Updated on review. Current Outpatient Medications  Medication Sig Dispense Refill   ALPRAZolam (XANAX) 1 MG tablet TAKE 1/2 TO 1 TABLET TWICE A DAY AS NEEDED FOR ANXIETY. Must have office or telemedicine visit for more.     buPROPion (WELLBUTRIN SR) 200 MG 12 hr tablet Take 200 mg by mouth 2 (two) times daily.     escitalopram (LEXAPRO) 20 MG tablet Take by mouth.     traZODone (DESYREL) 100 MG tablet TAKE DAILY AS DIRECTED FOR INSOMNIA (Patient not taking: Reported on 04/01/2020)     traZODone (DESYREL) 25 mg TABS tablet Take 25 mg by mouth at bedtime.     XTANDI 40 MG tablet Take 4 tablets ('160mg'$ ) by mouth once daily as directed by physician. 120 tablet 0   No current facility-administered medications for this visit.     Allergies:  Allergies  Allergen Reactions   Codeine Nausea And Vomiting      Physical  Exam:   Blood pressure 117/73, pulse 91, temperature 97.7 F (36.5 C), temperature source Oral, resp. rate 18, weight 197 lb 0.3 oz (89.4 kg), SpO2 96 %.      ECOG: 0     General appearance: Alert, awake without any distress. Head: Atraumatic without abnormalities Oropharynx: Without any thrush or ulcers. Eyes: No scleral icterus. Lymph nodes: No lymphadenopathy noted in the cervical, supraclavicular, or axillary nodes Heart:regular rate and rhythm, without any murmurs or gallops.   Lung: Clear to auscultation without any rhonchi, wheezes or dullness to percussion. Abdomin: Soft, nontender  without any shifting dullness or ascites. Musculoskeletal: No clubbing or cyanosis. Neurological: No motor or sensory deficits. Skin: No rashes or lesions.         Lab Results: Lab Results  Component Value Date   WBC 4.2 03/10/2021   HGB 13.7 03/10/2021   HCT 40.9 03/10/2021   MCV 91.9 03/10/2021   PLT 247 03/10/2021     Chemistry      Component Value Date/Time   NA 140 03/10/2021 1108   K 4.8 03/10/2021 1108   CL 106 03/10/2021 1108   CO2 26 03/10/2021 1108   BUN 15 03/10/2021 1108   CREATININE 0.99 03/10/2021 1108      Component Value Date/Time   CALCIUM 8.9 03/10/2021 1108   ALKPHOS 90 03/10/2021 1108   AST 20 03/10/2021 1108   ALT 22 03/10/2021 1108   BILITOT 0.4 03/10/2021 1108        Results for Nathan Berg, Nathan Berg (MRN [id number]) as of 03/17/2021 15:27  Ref. Range 12/24/2020 11:16 02/03/2021 13:04 03/10/2021 11:08  Prostate Specific Ag, Serum Latest Ref Range: 0.0 - 4.0 ng/mL 16.2 (H) 10.5 (H) 7.5 (H)     Impression and Plan:  65 year old man with:   1.   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    Is disease status updated at this time and treatment choices were reviewed.  Continues to be on Xtandi with excellent PSA response currently at 7.5.  Risks and benefits of continuing this treatment were reviewed.  Complication clinic hypertension, fatigue among others were reiterated.  Systemic chemotherapy with Taxotere will be the next salvage option if he developed relapsed disease.  He is experiencing some complications although willing to continue.  I offered the dose reduction 120 mg or 80 mg of needed.  He is agreeable to continue with same dose and schedule.   2.  Androgen deprivation therapy: He has not received any additional androgen deprivation with testosterone level remains at a castrate level.  Eligard will be repeated in in the future if his testosterone starts to rise.   3.   Bone directed therapy: I recommended calcium and vitamin D supplement.  Delton See will be used after obtaining dental clearance.  4.  Spinal metastasis: Radiation therapy can be used for palliative purposes if he develops any symptoms.  5.  Anxiety: He does have history of depression which seems to be exacerbated by Xtandi.  I will him a prescription for Xanax to use as needed.   6.   Follow-up: In 6 weeks for repeat follow-up.     30  minutes were dedicated to this visit.  The time was spent on reviewing laboratory data, disease status update, treatment choices and future plan of care reviewed.   Zola Button, MD 9/13/20223:43 PM

## 2021-03-27 ENCOUNTER — Other Ambulatory Visit: Payer: Self-pay | Admitting: *Deleted

## 2021-03-27 DIAGNOSIS — C61 Malignant neoplasm of prostate: Secondary | ICD-10-CM

## 2021-03-27 MED ORDER — ALPRAZOLAM 1 MG PO TABS: ORAL_TABLET | ORAL | 0 refills | Status: DC

## 2021-04-10 ENCOUNTER — Other Ambulatory Visit: Payer: Self-pay | Admitting: *Deleted

## 2021-04-10 DIAGNOSIS — C61 Malignant neoplasm of prostate: Secondary | ICD-10-CM

## 2021-04-10 MED ORDER — ENZALUTAMIDE 40 MG PO TABS: ORAL_TABLET | ORAL | 0 refills | Status: DC

## 2021-04-28 ENCOUNTER — Other Ambulatory Visit: Payer: Self-pay

## 2021-04-28 ENCOUNTER — Inpatient Hospital Stay: Payer: Medicare Other | Attending: Oncology

## 2021-04-28 DIAGNOSIS — Z9079 Acquired absence of other genital organ(s): Secondary | ICD-10-CM | POA: Insufficient documentation

## 2021-04-28 DIAGNOSIS — C61 Malignant neoplasm of prostate: Secondary | ICD-10-CM | POA: Insufficient documentation

## 2021-04-28 DIAGNOSIS — C7951 Secondary malignant neoplasm of bone: Secondary | ICD-10-CM | POA: Diagnosis present

## 2021-04-28 LAB — CBC WITH DIFFERENTIAL (CANCER CENTER ONLY)
Abs Immature Granulocytes: 0.01 10*3/uL (ref 0.00–0.07)
Basophils Absolute: 0 10*3/uL (ref 0.0–0.1)
Basophils Relative: 1 %
Eosinophils Absolute: 0.1 10*3/uL (ref 0.0–0.5)
Eosinophils Relative: 3 %
HCT: 40.2 % (ref 39.0–52.0)
Hemoglobin: 13.8 g/dL (ref 13.0–17.0)
Immature Granulocytes: 0 %
Lymphocytes Relative: 32 %
Lymphs Abs: 1.3 10*3/uL (ref 0.7–4.0)
MCH: 31 pg (ref 26.0–34.0)
MCHC: 34.3 g/dL (ref 30.0–36.0)
MCV: 90.3 fL (ref 80.0–100.0)
Monocytes Absolute: 0.5 10*3/uL (ref 0.1–1.0)
Monocytes Relative: 13 %
Neutro Abs: 2.1 10*3/uL (ref 1.7–7.7)
Neutrophils Relative %: 51 %
Platelet Count: 273 10*3/uL (ref 150–400)
RBC: 4.45 MIL/uL (ref 4.22–5.81)
RDW: 12.8 % (ref 11.5–15.5)
WBC Count: 4 10*3/uL (ref 4.0–10.5)
nRBC: 0 % (ref 0.0–0.2)

## 2021-04-28 LAB — CMP (CANCER CENTER ONLY)
ALT: 19 U/L (ref 0–44)
AST: 19 U/L (ref 15–41)
Albumin: 3.6 g/dL (ref 3.5–5.0)
Alkaline Phosphatase: 95 U/L (ref 38–126)
Anion gap: 8 (ref 5–15)
BUN: 13 mg/dL (ref 8–23)
CO2: 23 mmol/L (ref 22–32)
Calcium: 9 mg/dL (ref 8.9–10.3)
Chloride: 107 mmol/L (ref 98–111)
Creatinine: 0.86 mg/dL (ref 0.61–1.24)
GFR, Estimated: >60 mL/min (ref >60)
Glucose, Bld: 86 mg/dL (ref 70–99)
Potassium: 4.5 mmol/L (ref 3.5–5.1)
Sodium: 138 mmol/L (ref 135–145)
Total Bilirubin: 0.5 mg/dL (ref 0.3–1.2)
Total Protein: 6.6 g/dL (ref 6.5–8.1)

## 2021-04-29 LAB — PROSTATE-SPECIFIC AG, SERUM (LABCORP): Prostate Specific Ag, Serum: 7.6 ng/mL — ABNORMAL HIGH (ref 0.0–4.0)

## 2021-04-29 LAB — TESTOSTERONE: Testosterone: <3 ng/dL — ABNORMAL LOW (ref 264–916)

## 2021-04-30 ENCOUNTER — Other Ambulatory Visit: Payer: Self-pay

## 2021-04-30 ENCOUNTER — Inpatient Hospital Stay (HOSPITAL_BASED_OUTPATIENT_CLINIC_OR_DEPARTMENT_OTHER): Payer: Medicare Other | Admitting: Oncology

## 2021-04-30 VITALS — BP 140/95 | HR 70 | Resp 17 | Wt 198.4 lb

## 2021-04-30 DIAGNOSIS — C61 Malignant neoplasm of prostate: Secondary | ICD-10-CM

## 2021-04-30 NOTE — Progress Notes (Signed)
Hematology and Oncology Follow Up Visit  Nathan Berg [id number] May 11, 1956 65 y.o. 04/30/2021 8:26 AM Nathan Berg, MDWilson, [name], MD   Principle Diagnosis: 66 year old man with advanced prostate cancer with disease to the bone and lymphadenopathy diagnosed in July 2021.  He has castration-resistant after presenting in 2015 with Gleason score 4+3 = 7 PSA of 9.1.   Prior Therapy: He is s/p robotic assisted laparoscopic radical prostatectomy and bilateral lymph node dissection in March 2015.  The final pathological staging was T2CN0.  His Gleason score was 4+3 = 7 in both lobes without any evidence of angiolymphatic invasion or extraprostatic extension.      His PSA was up to 0.09 in October 2015.  He subsequently received salvage radiation under the care of Dr. Tammi Berg after receiving 38.4 Pearline Cables in 38 fractions between May 06, 2014 and June 26, 2014.  He has received 6 months of androgen deprivation utilizing Lupron.   His a PSA was 0.086 in 2017 and the testosterone level was low at that time despite stopping androgen deprivation.  He subsequently started on testosterone replacement therapy with improvement in his testosterone level.  In 2018 his PSA remained low at 0.2 and subsequently 0.68.  In 2019 his PSA started to rise up to 0.94 in April 2019.  His PSA in October 2019 was 1.07, in December 2019 his PSA was 0.83, in July 2020 his PSA was 1.27, in October 2020 his PSA was 2.35 with a testosterone level of 245.  He has been off testosterone therapy.   PSA in March 2021 was 3.92 and in June 2021 was 8.18.  He was found to have oligometastatic disease.  He is status post radiation therapy between August 11 and March 02, 2020.  Right iliac crest and the right internal iliac lymph node.  He developed metastasis based on pet imaging on September 16, 2020.  Zytiga 1000 mg with prednisone given in July 2022 while he is obtaining coverage for Xtandi.   Current therapy:    Eligard  30 mg started on September 18, 2020 every 4 months.  Next injection will be given if his testosterone starts to rise.  Xtandi 160 mg daily started in August 2022.  Interim History: Nathan Berg returns today for a follow-up evaluation.  Since the last visit, he reports no major changes in his health.  He has tolerated Xtandi without any major complaints.  He denies any nausea, vomiting or abdominal pain.  He does report some fatigue and tiredness occasionally.  He denies any worsening bone pain or pathological fractures.  He denies any changes in his performance status or quality of life.  .    Medications: Reviewed without changes. Current Outpatient Medications  Medication Sig Dispense Refill   ALPRAZolam (XANAX) 1 MG tablet TAKE 1/2 TO 1 TABLET TWICE A DAY AS NEEDED FOR ANXIETY. Must have office or telemedicine visit for more. 30 tablet 0   buPROPion (WELLBUTRIN SR) 200 MG 12 hr tablet Take 200 mg by mouth 2 (two) times daily.     enzalutamide (XTANDI) 40 MG tablet Take 4 tablets (160mg ) by mouth once daily as directed by physician. 120 tablet 0   escitalopram (LEXAPRO) 20 MG tablet Take by mouth.     traZODone (DESYREL) 100 MG tablet TAKE DAILY AS DIRECTED FOR INSOMNIA (Patient not taking: Reported on 04/01/2020)     traZODone (DESYREL) 25 mg TABS tablet Take 25 mg by mouth at bedtime.     No current facility-administered  medications for this visit.     Allergies:  Allergies  Allergen Reactions   Codeine Nausea And Vomiting      Physical Exam:     Blood pressure (!) 140/95, pulse 70, resp. rate 17, weight 198 lb 6 oz (90 kg), SpO2 99 %.     ECOG: 0    General appearance: Comfortable appearing without any discomfort Head: Normocephalic without any trauma Oropharynx: Mucous membranes are moist and pink without any thrush or ulcers. Eyes: Pupils are equal and round reactive to light. Lymph nodes: No cervical, supraclavicular, inguinal or axillary lymphadenopathy.    Heart:regular rate and rhythm.  S1 and S2 without leg edema. Lung: Clear without any rhonchi or wheezes.  No dullness to percussion. Abdomin: Soft, nontender, nondistended with good bowel sounds.  No hepatosplenomegaly. Musculoskeletal: No joint deformity or effusion.  Full range of motion noted. Neurological: No deficits noted on motor, sensory and deep tendon reflex exam. Skin: No petechial rash or dryness.  Appeared moist.           Lab Results: Lab Results  Component Value Date   WBC 4.0 04/28/2021   HGB 13.8 04/28/2021   HCT 40.2 04/28/2021   MCV 90.3 04/28/2021   PLT 273 04/28/2021     Chemistry      Component Value Date/Time   NA 138 04/28/2021 0925   K 4.5 04/28/2021 0925   CL 107 04/28/2021 0925   CO2 23 04/28/2021 0925   BUN 13 04/28/2021 0925   CREATININE 0.86 04/28/2021 0925      Component Value Date/Time   CALCIUM 9.0 04/28/2021 0925   ALKPHOS 95 04/28/2021 0925   AST 19 04/28/2021 0925   ALT 19 04/28/2021 0925   BILITOT 0.5 04/28/2021 0925        Results for Nathan Berg (MRN [id number]) as of 04/30/2021 08:29  Ref. Range 02/03/2021 13:04 03/10/2021 11:08 04/28/2021 09:25  Prostate Specific Ag, Serum Latest Ref Range: 0.0 - 4.0 ng/mL 10.5 (H) 7.5 (H) 7.6 (H)      Impression and Plan:  65 year old man with:   1.   Advanced prostate cancer with disease to the bone and lymphadenopathy diagnosed in March 2022.  He has castration-resistant disease at this time.  The natural course of his disease was reviewed at this time and treatment choices were reiterated.  His PSA continues to be under reasonable control with the current treatment without any need for additional intervention.  Treatment such as Taxotere chemotherapy among others will be reserved if he has progression of disease.  He is agreeable to continue at this time.   2.  Androgen deprivation therapy: He is currently on Eligard with PSA undetectable.  I recommended continued monitoring  and reinstitute androgen deprivation if his testosterone starts to rise.   3.  Bone directed therapy: He will continue calcium and vitamin D supplements and obtain dental clearance for Xgeva in the future.  4.  Spinal metastasis: Radiation therapy can be used if he develops symptoms in the future.  5.  Anxiety: Manageable at this time.   6.   Follow-up: he will return next 6 to 8 weeks for repeat follow-up.     30  minutes were spent on this encounter.  The time was dedicated to reviewing laboratory data, treatment choices and addressing complications related to cancer and cancer therapy.  Zola Button, MD 10/27/20228:26 AM

## 2021-05-05 ENCOUNTER — Other Ambulatory Visit: Payer: Self-pay | Admitting: Oncology

## 2021-05-05 ENCOUNTER — Telehealth: Payer: Self-pay

## 2021-05-05 DIAGNOSIS — C61 Malignant neoplasm of prostate: Secondary | ICD-10-CM

## 2021-05-05 NOTE — Telephone Encounter (Signed)
Oral Oncology Patient Advocate Encounter  Met patient in lobby room to complete re-enrollment application for [place] in an effort to reduce patient's out of pocket expense for Xtandi to $0.    Application completed and faxed to [phone].   Xtandi patient assistance phone number for follow up is [phone].   This encounter will be updated until final determination.   [place] [facility] [place] Phone [phone] Fax [phone] 05/05/2021 4:34 PM

## 2021-05-18 ENCOUNTER — Inpatient Hospital Stay: Payer: Medicare Other | Attending: Oncology

## 2021-05-18 ENCOUNTER — Other Ambulatory Visit: Payer: Self-pay

## 2021-05-18 DIAGNOSIS — C7951 Secondary malignant neoplasm of bone: Secondary | ICD-10-CM | POA: Diagnosis present

## 2021-05-18 DIAGNOSIS — C61 Malignant neoplasm of prostate: Secondary | ICD-10-CM | POA: Insufficient documentation

## 2021-05-18 LAB — CBC WITH DIFFERENTIAL (CANCER CENTER ONLY)
Abs Immature Granulocytes: 0.01 10*3/uL (ref 0.00–0.07)
Basophils Absolute: 0 10*3/uL (ref 0.0–0.1)
Basophils Relative: 1 %
Eosinophils Absolute: 0.1 10*3/uL (ref 0.0–0.5)
Eosinophils Relative: 3 %
HCT: 41 % (ref 39.0–52.0)
Hemoglobin: 13.8 g/dL (ref 13.0–17.0)
Immature Granulocytes: 0 %
Lymphocytes Relative: 31 %
Lymphs Abs: 1.2 10*3/uL (ref 0.7–4.0)
MCH: 30.6 pg (ref 26.0–34.0)
MCHC: 33.7 g/dL (ref 30.0–36.0)
MCV: 90.9 fL (ref 80.0–100.0)
Monocytes Absolute: 0.5 10*3/uL (ref 0.1–1.0)
Monocytes Relative: 13 %
Neutro Abs: 2 10*3/uL (ref 1.7–7.7)
Neutrophils Relative %: 52 %
Platelet Count: 256 10*3/uL (ref 150–400)
RBC: 4.51 MIL/uL (ref 4.22–5.81)
RDW: 12.6 % (ref 11.5–15.5)
WBC Count: 3.9 10*3/uL — ABNORMAL LOW (ref 4.0–10.5)
nRBC: 0 % (ref 0.0–0.2)

## 2021-05-18 LAB — CMP (CANCER CENTER ONLY)
ALT: 15 U/L (ref 0–44)
AST: 17 U/L (ref 15–41)
Albumin: 3.6 g/dL (ref 3.5–5.0)
Alkaline Phosphatase: 86 U/L (ref 38–126)
Anion gap: 7 (ref 5–15)
BUN: 18 mg/dL (ref 8–23)
CO2: 26 mmol/L (ref 22–32)
Calcium: 8.9 mg/dL (ref 8.9–10.3)
Chloride: 105 mmol/L (ref 98–111)
Creatinine: 0.87 mg/dL (ref 0.61–1.24)
GFR, Estimated: >60 mL/min (ref >60)
Glucose, Bld: 100 mg/dL — ABNORMAL HIGH (ref 70–99)
Potassium: 4.5 mmol/L (ref 3.5–5.1)
Sodium: 138 mmol/L (ref 135–145)
Total Bilirubin: 0.5 mg/dL (ref 0.3–1.2)
Total Protein: 6.5 g/dL (ref 6.5–8.1)

## 2021-05-19 LAB — PROSTATE-SPECIFIC AG, SERUM (LABCORP): Prostate Specific Ag, Serum: 7.2 ng/mL — ABNORMAL HIGH (ref 0.0–4.0)

## 2021-05-19 LAB — TESTOSTERONE: Testosterone: <3 ng/dL — ABNORMAL LOW (ref 264–916)

## 2021-05-20 ENCOUNTER — Inpatient Hospital Stay: Payer: Medicare Other | Admitting: Oncology

## 2021-06-15 NOTE — Telephone Encounter (Signed)
Received a fax from   [employer]  regarding an approval for  [place]  patient assistance from 07/05/21 to 07/04/22.   Phone number: [phone]

## 2021-06-17 ENCOUNTER — Other Ambulatory Visit: Payer: Self-pay

## 2021-06-17 ENCOUNTER — Inpatient Hospital Stay: Payer: Medicare Other | Attending: Oncology

## 2021-06-17 DIAGNOSIS — C61 Malignant neoplasm of prostate: Secondary | ICD-10-CM | POA: Insufficient documentation

## 2021-06-17 DIAGNOSIS — Z79899 Other long term (current) drug therapy: Secondary | ICD-10-CM | POA: Diagnosis not present

## 2021-06-17 DIAGNOSIS — C7951 Secondary malignant neoplasm of bone: Secondary | ICD-10-CM | POA: Insufficient documentation

## 2021-06-17 DIAGNOSIS — F419 Anxiety disorder, unspecified: Secondary | ICD-10-CM | POA: Diagnosis not present

## 2021-06-17 LAB — CBC WITH DIFFERENTIAL (CANCER CENTER ONLY)
Abs Immature Granulocytes: 0.01 10*3/uL (ref 0.00–0.07)
Basophils Absolute: 0 10*3/uL (ref 0.0–0.1)
Basophils Relative: 1 %
Eosinophils Absolute: 0.1 10*3/uL (ref 0.0–0.5)
Eosinophils Relative: 2 %
HCT: 41.1 % (ref 39.0–52.0)
Hemoglobin: 13.8 g/dL (ref 13.0–17.0)
Immature Granulocytes: 0 %
Lymphocytes Relative: 29 %
Lymphs Abs: 1.1 10*3/uL (ref 0.7–4.0)
MCH: 30.7 pg (ref 26.0–34.0)
MCHC: 33.6 g/dL (ref 30.0–36.0)
MCV: 91.3 fL (ref 80.0–100.0)
Monocytes Absolute: 0.5 10*3/uL (ref 0.1–1.0)
Monocytes Relative: 13 %
Neutro Abs: 1.9 10*3/uL (ref 1.7–7.7)
Neutrophils Relative %: 55 %
Platelet Count: 243 10*3/uL (ref 150–400)
RBC: 4.5 MIL/uL (ref 4.22–5.81)
RDW: 12.7 % (ref 11.5–15.5)
WBC Count: 3.6 10*3/uL — ABNORMAL LOW (ref 4.0–10.5)
nRBC: 0 % (ref 0.0–0.2)

## 2021-06-17 LAB — CMP (CANCER CENTER ONLY)
ALT: 15 U/L (ref 0–44)
AST: 17 U/L (ref 15–41)
Albumin: 3.4 g/dL — ABNORMAL LOW (ref 3.5–5.0)
Alkaline Phosphatase: 91 U/L (ref 38–126)
Anion gap: 8 (ref 5–15)
BUN: 14 mg/dL (ref 8–23)
CO2: 25 mmol/L (ref 22–32)
Calcium: 8.7 mg/dL — ABNORMAL LOW (ref 8.9–10.3)
Chloride: 107 mmol/L (ref 98–111)
Creatinine: 0.94 mg/dL (ref 0.61–1.24)
GFR, Estimated: >60 mL/min (ref >60)
Glucose, Bld: 76 mg/dL (ref 70–99)
Potassium: 4.4 mmol/L (ref 3.5–5.1)
Sodium: 140 mmol/L (ref 135–145)
Total Bilirubin: 0.4 mg/dL (ref 0.3–1.2)
Total Protein: 6.4 g/dL — ABNORMAL LOW (ref 6.5–8.1)

## 2021-06-18 LAB — PROSTATE-SPECIFIC AG, SERUM (LABCORP): Prostate Specific Ag, Serum: 7.6 ng/mL — ABNORMAL HIGH (ref 0.0–4.0)

## 2021-06-18 LAB — TESTOSTERONE: Testosterone: <3 ng/dL — ABNORMAL LOW (ref 264–916)

## 2021-06-19 ENCOUNTER — Other Ambulatory Visit: Payer: Self-pay

## 2021-06-19 ENCOUNTER — Inpatient Hospital Stay (HOSPITAL_BASED_OUTPATIENT_CLINIC_OR_DEPARTMENT_OTHER): Payer: Medicare Other | Admitting: Oncology

## 2021-06-19 VITALS — BP 132/84 | HR 83 | Temp 97.3°F | Resp 20 | Ht 71.0 in | Wt 203.0 lb

## 2021-06-19 DIAGNOSIS — C61 Malignant neoplasm of prostate: Secondary | ICD-10-CM

## 2021-06-19 NOTE — Progress Notes (Signed)
Hematology and Oncology Follow Up Visit  Nathan Berg [id number] September 09, 1955 65 y.o. 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  He presented in 2015 with Gleason score 4+3 = 7 PSA of 9.1.   Prior Therapy: He is s/p robotic assisted laparoscopic radical prostatectomy and bilateral lymph node dissection in March 2015.  The final pathological staging was T2CN0.  His Gleason score was 4+3 = 7 in both lobes without any evidence of angiolymphatic invasion or extraprostatic extension.      His PSA was up to 0.09 in October 2015.  He subsequently received salvage radiation under the care of Dr. Tammi Klippel after receiving 38.4 Pearline Cables in 38 fractions between May 06, 2014 and June 26, 2014.  He has received 6 months of androgen deprivation utilizing Lupron.   His a PSA was 0.086 in 2017 and the testosterone level was low at that time despite stopping androgen deprivation.  He subsequently started on testosterone replacement therapy with improvement in his testosterone level.  In 2018 his PSA remained low at 0.2 and subsequently 0.68.  In 2019 his PSA started to rise up to 0.94 in April 2019.  His PSA in October 2019 was 1.07, in December 2019 his PSA was 0.83, in July 2020 his PSA was 1.27, in October 2020 his PSA was 2.35 with a testosterone level of 245.  He has been off testosterone therapy.   PSA in March 2021 was 3.92 and in June 2021 was 8.18.  He was found to have oligometastatic disease.  He is status post radiation therapy between August 11 and March 02, 2020.  Right iliac crest and the right internal iliac lymph node.  He developed metastasis based on pet imaging on September 16, 2020.  Zytiga 1000 mg with prednisone given in July 2022 while he is obtaining coverage for Xtandi.   Current therapy:    Eligard 30 mg  started on September 18, 2020 every 4 months.  Next injection will be given if his testosterone starts to rise.  Xtandi 160 mg daily started in August 2022.  Interim History: Nathan Berg is here for a follow-up evaluation.  Since last visit, he reports no major changes in his health.  He continues to tolerate Xtandi with a few complaints including arthralgias and myalgias and mild fatigue but overall manageable.  He denies any hematuria or dysuria but does report occasional incontinence.  He denies any recent anxiety or depression.     Medications: Updated on review Current Outpatient Medications  Medication Sig Dispense Refill   ALPRAZolam (XANAX) 1 MG tablet TAKE 1/2 TO 1 TABLET TWICE A DAY AS NEEDED FOR ANXIETY. Must have office or telemedicine visit for more. 30 tablet 0   buPROPion (WELLBUTRIN SR) 200 MG 12 hr tablet Take 200 mg by mouth 2 (two) times daily.     escitalopram (LEXAPRO) 20 MG tablet Take by mouth.     traZODone (DESYREL) 100 MG tablet TAKE DAILY AS DIRECTED FOR INSOMNIA (Patient not taking: Reported on 04/01/2020)     traZODone (DESYREL) 25 mg TABS tablet Take 25 mg by mouth at bedtime.     XTANDI 40 MG tablet Take 4 tablets (160mg ) by mouth once daily as directed by physician. 120 tablet 0   No current facility-administered medications for this visit.     Allergies:  Allergies  Allergen Reactions  Codeine Nausea And Vomiting      Physical Exam:       Blood pressure 132/84, pulse 83, temperature (!) 97.3 F (36.3 C), temperature source Tympanic, resp. rate 20, height 5\' 11"  (1.803 m), weight 203 lb (92.1 kg), SpO2 98 %.    ECOG: 0   General appearance: Alert, awake without any distress. Head: Atraumatic without abnormalities Oropharynx: Without any thrush or ulcers. Eyes: No scleral icterus. Lymph nodes: No lymphadenopathy noted in the cervical, supraclavicular, or axillary nodes Heart:regular rate and rhythm, without any murmurs or gallops.   Lung:  Clear to auscultation without any rhonchi, wheezes or dullness to percussion. Abdomin: Soft, nontender without any shifting dullness or ascites. Musculoskeletal: No clubbing or cyanosis. Neurological: No motor or sensory deficits. Skin: No rashes or lesions.           Lab Results: Lab Results  Component Value Date   WBC 3.6 (L) 06/17/2021   HGB 13.8 06/17/2021   HCT 41.1 06/17/2021   MCV 91.3 06/17/2021   PLT 243 06/17/2021     Chemistry      Component Value Date/Time   NA 140 06/17/2021 1202   K 4.4 06/17/2021 1202   CL 107 06/17/2021 1202   CO2 25 06/17/2021 1202   BUN 14 06/17/2021 1202   CREATININE 0.94 06/17/2021 1202      Component Value Date/Time   CALCIUM 8.7 (L) 06/17/2021 1202   ALKPHOS 91 06/17/2021 1202   AST 17 06/17/2021 1202   ALT 15 06/17/2021 1202   BILITOT 0.4 06/17/2021 1202         Latest Reference Range & Units 12/24/20 11:16 02/03/21 13:04 03/10/21 11:08 04/28/21 09:25 05/18/21 10:01 06/17/21 12:02  Prostate Specific Ag, Serum 0.0 - 4.0 ng/mL 16.2 (H) 10.5 (H) 7.5 (H) 7.6 (H) 7.2 (H) 7.6 (H)  (H): Data is abnormally high     Impression and Plan:  65 year old man with:   1.   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     He is currently on Xtandi with reasonable tolerance and PSA is under control.  Currently at 7.6 and has declined from 16.2 in June 2022.  Risks and benefits of continuing this treatment were reviewed at this time.  Different salvage therapy options including Taxotere chemotherapy among others were reiterated.  After discussion today, given the stability of his counts we will continue to monitor and repeat testing in 6 weeks.   2.  Androgen deprivation therapy: His testosterone level continues to be very low and Eligard will be reinstituted his testosterone rises.   3.  Bone directed therapy: I recommended continued calcium vitamin D  supplement with Delton See would be considered after dental clearance.  4.  Bone pain: Reasonably controlled at this time with palliative radiation treatment could be considered.  5.  Anxiety: Currently on Wellbutrin and Xanax with anxiety manageable.   6.   Follow-up: In 6 weeks for repeat follow-up.     30  minutes were dedicated to this visit.  Time spent on reviewing laboratory data, disease status update and outlining future plan of care discussion.  Zola Button, MD 12/16/20222:58 PM

## 2021-06-23 ENCOUNTER — Other Ambulatory Visit: Payer: Self-pay | Admitting: *Deleted

## 2021-06-23 DIAGNOSIS — C61 Malignant neoplasm of prostate: Secondary | ICD-10-CM

## 2021-06-23 MED ORDER — ENZALUTAMIDE 40 MG PO TABS
ORAL_TABLET | ORAL | 0 refills | Status: DC
Start: 2021-06-23 — End: 2021-07-31

## 2021-06-26 ENCOUNTER — Telehealth: Payer: Self-pay | Admitting: Oncology

## 2021-06-26 NOTE — Telephone Encounter (Signed)
Sch per 12/16 los, pt aware °

## 2021-07-31 ENCOUNTER — Other Ambulatory Visit: Payer: Self-pay

## 2021-07-31 ENCOUNTER — Other Ambulatory Visit: Payer: Self-pay | Admitting: *Deleted

## 2021-07-31 ENCOUNTER — Inpatient Hospital Stay: Payer: Medicare Other | Attending: Oncology

## 2021-07-31 DIAGNOSIS — C61 Malignant neoplasm of prostate: Secondary | ICD-10-CM

## 2021-07-31 LAB — CMP (CANCER CENTER ONLY)
ALT: 10 U/L (ref 0–44)
AST: 14 U/L — ABNORMAL LOW (ref 15–41)
Albumin: 4 g/dL (ref 3.5–5.0)
Alkaline Phosphatase: 96 U/L (ref 38–126)
Anion gap: 7 (ref 5–15)
BUN: 18 mg/dL (ref 8–23)
CO2: 28 mmol/L (ref 22–32)
Calcium: 8.9 mg/dL (ref 8.9–10.3)
Chloride: 103 mmol/L (ref 98–111)
Creatinine: 0.96 mg/dL (ref 0.61–1.24)
GFR, Estimated: >60 mL/min (ref >60)
Glucose, Bld: 98 mg/dL (ref 70–99)
Potassium: 4.2 mmol/L (ref 3.5–5.1)
Sodium: 138 mmol/L (ref 135–145)
Total Bilirubin: 0.4 mg/dL (ref 0.3–1.2)
Total Protein: 6.8 g/dL (ref 6.5–8.1)

## 2021-07-31 LAB — CBC WITH DIFFERENTIAL (CANCER CENTER ONLY)
Abs Immature Granulocytes: 0.01 10*3/uL (ref 0.00–0.07)
Basophils Absolute: 0 10*3/uL (ref 0.0–0.1)
Basophils Relative: 1 %
Eosinophils Absolute: 0.1 10*3/uL (ref 0.0–0.5)
Eosinophils Relative: 2 %
HCT: 42.1 % (ref 39.0–52.0)
Hemoglobin: 14.2 g/dL (ref 13.0–17.0)
Immature Granulocytes: 0 %
Lymphocytes Relative: 35 %
Lymphs Abs: 1.4 10*3/uL (ref 0.7–4.0)
MCH: 30.6 pg (ref 26.0–34.0)
MCHC: 33.7 g/dL (ref 30.0–36.0)
MCV: 90.7 fL (ref 80.0–100.0)
Monocytes Absolute: 0.5 10*3/uL (ref 0.1–1.0)
Monocytes Relative: 13 %
Neutro Abs: 1.9 10*3/uL (ref 1.7–7.7)
Neutrophils Relative %: 49 %
Platelet Count: 281 10*3/uL (ref 150–400)
RBC: 4.64 MIL/uL (ref 4.22–5.81)
RDW: 12.3 % (ref 11.5–15.5)
WBC Count: 4 10*3/uL (ref 4.0–10.5)
nRBC: 0 % (ref 0.0–0.2)

## 2021-07-31 MED ORDER — ENZALUTAMIDE 40 MG PO TABS: ORAL_TABLET | ORAL | 0 refills | Status: DC

## 2021-08-02 LAB — PROSTATE-SPECIFIC AG, SERUM (LABCORP): Prostate Specific Ag, Serum: 8.7 ng/mL — ABNORMAL HIGH (ref 0.0–4.0)

## 2021-08-02 LAB — TESTOSTERONE: Testosterone: 6 ng/dL — ABNORMAL LOW (ref 264–916)

## 2021-08-05 ENCOUNTER — Telehealth: Payer: Self-pay | Admitting: *Deleted

## 2021-08-05 NOTE — Telephone Encounter (Signed)
Patient called office. He has seen recent lab work results from 07/31/21 with increased PSA and wants to talk with Dr. Alen Blew. Message routed to Dr. Alen Blew

## 2021-08-06 ENCOUNTER — Other Ambulatory Visit: Payer: Self-pay | Admitting: Oncology

## 2021-08-06 DIAGNOSIS — C61 Malignant neoplasm of prostate: Secondary | ICD-10-CM

## 2021-08-06 MED ORDER — GABAPENTIN 300 MG PO CAPS: 300.0000 mg | ORAL_CAPSULE | Freq: Every day | ORAL | 3 refills | Status: DC

## 2021-08-06 NOTE — Telephone Encounter (Signed)
Dr. Hazeline Junker response: "This was discussed with him via phone"

## 2021-08-08 ENCOUNTER — Other Ambulatory Visit: Payer: Self-pay | Admitting: Oncology

## 2021-08-08 DIAGNOSIS — C61 Malignant neoplasm of prostate: Secondary | ICD-10-CM

## 2021-08-10 ENCOUNTER — Other Ambulatory Visit (HOSPITAL_COMMUNITY): Payer: Self-pay

## 2021-08-10 MED ORDER — ENZALUTAMIDE 40 MG PO TABS
ORAL_TABLET | ORAL | 0 refills | Status: DC
  Filled 2021-08-10: qty 120, 30d supply, fill #0

## 2021-08-26 ENCOUNTER — Encounter (HOSPITAL_COMMUNITY)
Admission: RE | Admit: 2021-08-26 | Discharge: 2021-08-26 | Disposition: A | Discharge status: Home / Self Care | Payer: Medicare Other | Source: Ambulatory Visit | Attending: Oncology | Admitting: Oncology

## 2021-08-26 ENCOUNTER — Other Ambulatory Visit: Payer: Self-pay

## 2021-08-26 DIAGNOSIS — C61 Malignant neoplasm of prostate: Secondary | ICD-10-CM | POA: Insufficient documentation

## 2021-08-26 MED ORDER — PIFLIFOLASTAT F 18 (PYLARIFY) INJECTION
9.0000 | Freq: Once | INTRAVENOUS | Status: AC
  Administered 2021-08-26: 9.1 via INTRAVENOUS

## 2021-08-28 ENCOUNTER — Encounter: Payer: Self-pay | Admitting: Oncology

## 2021-09-05 ENCOUNTER — Other Ambulatory Visit: Payer: Self-pay | Admitting: Oncology

## 2021-09-05 DIAGNOSIS — C61 Malignant neoplasm of prostate: Secondary | ICD-10-CM

## 2021-09-06 ENCOUNTER — Emergency Department (HOSPITAL_COMMUNITY): Payer: Medicare Other

## 2021-09-06 ENCOUNTER — Emergency Department (HOSPITAL_COMMUNITY)
Admission: EM | Admit: 2021-09-06 | Discharge: 2021-09-06 | Disposition: A | Discharge status: Home / Self Care | Payer: Medicare Other | Attending: Emergency Medicine | Admitting: Emergency Medicine

## 2021-09-06 ENCOUNTER — Other Ambulatory Visit: Payer: Self-pay

## 2021-09-06 DIAGNOSIS — R0602 Shortness of breath: Secondary | ICD-10-CM | POA: Diagnosis not present

## 2021-09-06 DIAGNOSIS — S2242XA Multiple fractures of ribs, left side, initial encounter for closed fracture: Secondary | ICD-10-CM | POA: Diagnosis not present

## 2021-09-06 DIAGNOSIS — Y93H2 Activity, gardening and landscaping: Secondary | ICD-10-CM | POA: Diagnosis not present

## 2021-09-06 DIAGNOSIS — S299XXA Unspecified injury of thorax, initial encounter: Secondary | ICD-10-CM | POA: Diagnosis present

## 2021-09-06 DIAGNOSIS — W1781XA Fall down embankment (hill), initial encounter: Secondary | ICD-10-CM | POA: Diagnosis not present

## 2021-09-06 NOTE — ED Notes (Signed)
Pt resting in bed. NAD. Alert and oriented  ? ?

## 2021-09-06 NOTE — ED Provider Notes (Signed)
?[facility] DEPT ?Provider Note ? ? ?CSN: [id number] ?Arrival date & time: 09/06/21  0957 ? ?  ? ?History ? ?Chief Complaint  ?Patient presents with  ? Fall  ? ? ?Nathan Berg is a 66 y.o. male. With past medical history of GERD, previous rib fractures with PTX requiring chest tube who presents to the ED after fall. ? ?States that two weeks ago he was cutting down trees. States that he fell, rolling down an embankment and coming to a stop. He denies hitting his head or loss of consciousness. He states he has had mild left-sided pain since then, but last night was getting up from the couch when he had a sudden sharp pain in his left ribs. States this caused him to have shortness of breath and feeling that he could not take a deep breath. He is concerned he has a repeated pneumothorax. He denies abdominal pain, nausea or vomiting. Denies fevers or cough.  ? ? ?Fall ?Associated symptoms include shortness of breath.  ? ?  ? ?Home Medications ?Prior to Admission medications   ?Medication Sig Start Date End Date Taking? Authorizing Provider  ?ALPRAZolam (XANAX) 1 MG tablet TAKE 1/2 TO 1 TABLET TWICE A DAY AS NEEDED FOR ANXIETY. Must have office or telemedicine visit for more. 03/27/21   Wyatt Portela, MD  ?buPROPion [facility] SR) 200 MG 12 hr tablet Take 200 mg by mouth 2 (two) times daily. 10/27/19   [provider]  ?escitalopram (LEXAPRO) 20 MG tablet Take by mouth. 11/13/19   [provider]  ?gabapentin (NEURONTIN) 300 MG capsule Take 1 capsule (300 mg total) by mouth daily. 08/06/21   Wyatt Portela, MD  ?traZODone (DESYREL) 100 MG tablet TAKE DAILY AS DIRECTED FOR INSOMNIA ?Patient not taking: Reported on 04/01/2020 07/26/19   [provider]  ?traZODone (DESYREL) 25 mg TABS tablet Take 25 mg by mouth at bedtime.    [provider]  ?   ? ?Allergies    ?Codeine   ? ?Review of Systems   ?Review of Systems  ?Respiratory:  Positive for shortness of  breath.   ?Musculoskeletal:   ?     Left sided chest/rib pain   ?All other systems reviewed and are negative. ? ?Physical Exam ?Updated Vital Signs ?BP 109/74 (BP Location: Left Arm)   Pulse 84   Temp 98.5 ?F (36.9 ?C) (Oral)   Resp 18   SpO2 94%  ?Physical Exam ?Vitals and nursing note reviewed.  ?Constitutional:   ?   General: He is not in acute distress. ?   Appearance: Normal appearance. He is not ill-appearing or toxic-appearing.  ?HENT:  ?   Head: Normocephalic and atraumatic.  ?Eyes:  ?   General: No scleral icterus. ?   Extraocular Movements: Extraocular movements intact.  ?   Pupils: Pupils are equal, round, and reactive to light.  ?Cardiovascular:  ?   Rate and Rhythm: Normal rate and regular rhythm.  ?   Pulses: Normal pulses.  ?   Heart sounds: No murmur heard. ?Pulmonary:  ?   Effort: Pulmonary effort is normal. No respiratory distress.  ?   Breath sounds: Normal breath sounds. No wheezing, rhonchi or rales.  ?   Comments: TTP of the left lower rib cage  ?Chest:  ?   Chest wall: Tenderness present.  ?Abdominal:  ?   General: Bowel sounds are normal. There is no distension.  ?   Palpations: Abdomen is soft.  ?  Tenderness: There is no abdominal tenderness.  ?Musculoskeletal:     ?   General: Normal range of motion.  ?   Cervical back: Neck supple.  ?Skin: ?   General: Skin is warm and dry.  ?   Capillary Refill: Capillary refill takes less than 2 seconds.  ?Neurological:  ?   General: No focal deficit present.  ?   Mental Status: He is alert and oriented to person, place, and time. Mental status is at baseline.  ?Psychiatric:     ?   Mood and Affect: Mood normal.     ?   Behavior: Behavior normal.     ?   Thought Content: Thought content normal.     ?   Judgment: Judgment normal.  ? ? ?ED Results / Procedures / Treatments   ?Labs ?(all labs ordered are listed, but only abnormal results are displayed) ?Labs Reviewed - No data to display ? ?EKG ?None ? ?Radiology ?DG Ribs Unilateral W/Chest  Left ? ?Result Date: 09/06/2021 ?CLINICAL DATA:  Status post fall. Anterior left-sided rib pain and difficulty taking a breath. EXAM: LEFT RIBS AND CHEST - 3+ VIEW COMPARISON:  PET-CT 08/26/2021 FINDINGS: Chronic anterior right third, fourth, and fifth rib deformities. Age-indeterminate anterior seventh and ninth rib deformities are identified in the area of the metallic fiducial marker. Stable cardiomediastinal contours. Low lung volumes with mild asymmetric elevation of the right hemidiaphragm. No pleural effusion, interstitial edema, atelectasis or pneumothorax. IMPRESSION: Age-indeterminate anterior seventh and ninth rib deformities are identified in the area of metallic fiducial marker. Chronic anterior right third, fourth, and fifth rib deformities. Electronically Signed   By: Kerby Moors M.D.   On: 09/06/2021 11:26   ? ?Procedures ?Procedures  ? ?Medications Ordered in ED ?Medications - No data to display ? ?ED Course/ Medical Decision Making/ A&P ?  ?                        ?Medical Decision Making ?Amount and/or Complexity of Data Reviewed ?Radiology: ordered. ? ?This patient presents to the ED for concern of left-sided pain, this involves an extensive number of treatment options, and is a complaint that carries with it a high risk of complications and morbidity.  The differential diagnosis includes rib fracture, pneumothorax, pulmonary contusion, abdominal injury  ? ?Co morbidities that complicate the patient evaluation ?None  ? ?Additional history obtained:  ?Additional history obtained from: none ?External records from outside source obtained and reviewed including: previous visits ? ?Imaging Studies ordered:  ?I ordered imaging studies which included x-ray.  I independently reviewed & interpreted imaging & am in agreement with radiology impression. ?Imaging shows: ?CXR: Age-indeterminate anterior seventh and ninth rib deformities are identified. Chronic anterior right third, fourth, and fifth rib  deformities ? ?ED Course: ?66 year old male who presents to the emergency department after fall two weeks ago. ? ?PE and imaging as described above. ?No evidence of abdominal traumatic injury.  Abdomen is soft and nontender.  Bowel sounds present.  Also has been 2 weeks since injury.  If there was an acute, emergent intra-abdominal injury, would have presented at this point. ? ?He does have tenderness over the left rib cage.  Obtained x-ray of the left rib, chest which shows age-indeterminate seventh and ninth rib deformities on the anterior left side.  These are likely subacute from his fall 2 weeks ago.  He does have chronic other rib deformities from his previous injury.  I do think that  these 2 age-indeterminate rib deformities are causing his symptoms.  There is no pneumonia, pneumothorax, pleural effusion.  I have given him into some spirometer which he knows how to use.  Discussed for him to use this multiple times a day to prevent pneumonia.  Also discussed splinting of his side when he coughs or takes a deep breath.  He verbalized understanding.  Discussed Tylenol and ibuprofen as needed for pain relief.  Discussed primary care follow-up as needed.  Discussed return precautions for worsening symptoms, difficulty breathing. ? ?After consideration of the diagnostic results and the patients response to treatment, I feel that the patent would benefit from discharge. ?The patient has been appropriately medically screened and/or stabilized in the ED. I have low suspicion for any other emergent medical condition which would require further screening, evaluation or treatment in the ED or require inpatient management. The patient is overall well appearing and non-toxic in appearance. They are hemodynamically stable at time of discharge.   ?Final Clinical Impression(s) / ED Diagnoses ?Final diagnoses:  ?Closed fracture of multiple ribs of left side, initial encounter  ? ? ?Rx / DC Orders ?ED Discharge Orders   ? ?  None  ? ?  ? ? ?  ?Mickie Hillier, PA-C ?09/06/21 1401 ? ?  ?Lajean Saver, MD ?09/08/21 1449 ? ?

## 2021-09-06 NOTE — ED Triage Notes (Signed)
Pt reports "falling down a hill 2 weeks ago." Pt reports left sided rib pain and having trouble taking a deep breath. Pt denies SHOB.  ?

## 2021-09-06 NOTE — Discharge Instructions (Addendum)
You were seen in the emergency department today after a fall 2 weeks ago.  You do have 2 likely new rib fractures on the left side.  Your lungs appear healthy.  I am providing you with an incentive spirometer for you to use over the next 1 to 2 weeks as you continue to heal up to prevent you from getting pneumonia.  You should also splint your side when you are coughing or taking deep breaths to aid in pain relief.  You can take ibuprofen or Tylenol as needed.  Please return to emergency department if you have sudden increase in work of breathing or shortness of breath. ?

## 2021-09-07 ENCOUNTER — Encounter: Payer: Self-pay | Admitting: Oncology

## 2021-09-18 ENCOUNTER — Other Ambulatory Visit: Payer: Self-pay

## 2021-09-18 ENCOUNTER — Inpatient Hospital Stay: Payer: Medicare Other | Attending: Oncology

## 2021-09-18 DIAGNOSIS — F419 Anxiety disorder, unspecified: Secondary | ICD-10-CM | POA: Diagnosis not present

## 2021-09-18 DIAGNOSIS — E291 Testicular hypofunction: Secondary | ICD-10-CM | POA: Insufficient documentation

## 2021-09-18 DIAGNOSIS — C7951 Secondary malignant neoplasm of bone: Secondary | ICD-10-CM | POA: Diagnosis not present

## 2021-09-18 DIAGNOSIS — C61 Malignant neoplasm of prostate: Secondary | ICD-10-CM | POA: Insufficient documentation

## 2021-09-18 DIAGNOSIS — M2550 Pain in unspecified joint: Secondary | ICD-10-CM | POA: Insufficient documentation

## 2021-09-18 DIAGNOSIS — F32A Depression, unspecified: Secondary | ICD-10-CM | POA: Diagnosis not present

## 2021-09-18 DIAGNOSIS — G893 Neoplasm related pain (acute) (chronic): Secondary | ICD-10-CM | POA: Insufficient documentation

## 2021-09-18 DIAGNOSIS — M791 Myalgia, unspecified site: Secondary | ICD-10-CM | POA: Diagnosis not present

## 2021-09-18 LAB — CBC WITH DIFFERENTIAL (CANCER CENTER ONLY)
Abs Immature Granulocytes: 0 10*3/uL (ref 0.00–0.07)
Basophils Absolute: 0 10*3/uL (ref 0.0–0.1)
Basophils Relative: 1 %
Eosinophils Absolute: 0.1 10*3/uL (ref 0.0–0.5)
Eosinophils Relative: 3 %
HCT: 39.7 % (ref 39.0–52.0)
Hemoglobin: 13.2 g/dL (ref 13.0–17.0)
Immature Granulocytes: 0 %
Lymphocytes Relative: 39 %
Lymphs Abs: 1.3 10*3/uL (ref 0.7–4.0)
MCH: 30.5 pg (ref 26.0–34.0)
MCHC: 33.2 g/dL (ref 30.0–36.0)
MCV: 91.7 fL (ref 80.0–100.0)
Monocytes Absolute: 0.5 10*3/uL (ref 0.1–1.0)
Monocytes Relative: 16 %
Neutro Abs: 1.3 10*3/uL — ABNORMAL LOW (ref 1.7–7.7)
Neutrophils Relative %: 41 %
Platelet Count: 275 10*3/uL (ref 150–400)
RBC: 4.33 MIL/uL (ref 4.22–5.81)
RDW: 12.6 % (ref 11.5–15.5)
WBC Count: 3.2 10*3/uL — ABNORMAL LOW (ref 4.0–10.5)
nRBC: 0 % (ref 0.0–0.2)

## 2021-09-18 LAB — CMP (CANCER CENTER ONLY)
ALT: 11 U/L (ref 0–44)
AST: 15 U/L (ref 15–41)
Albumin: 3.7 g/dL (ref 3.5–5.0)
Alkaline Phosphatase: 111 U/L (ref 38–126)
Anion gap: 5 (ref 5–15)
BUN: 17 mg/dL (ref 8–23)
CO2: 30 mmol/L (ref 22–32)
Calcium: 8.8 mg/dL — ABNORMAL LOW (ref 8.9–10.3)
Chloride: 103 mmol/L (ref 98–111)
Creatinine: 1 mg/dL (ref 0.61–1.24)
GFR, Estimated: >60 mL/min (ref >60)
Glucose, Bld: 85 mg/dL (ref 70–99)
Potassium: 4.6 mmol/L (ref 3.5–5.1)
Sodium: 138 mmol/L (ref 135–145)
Total Bilirubin: 0.3 mg/dL (ref 0.3–1.2)
Total Protein: 6.4 g/dL — ABNORMAL LOW (ref 6.5–8.1)

## 2021-09-19 LAB — TESTOSTERONE: Testosterone: 9 ng/dL — ABNORMAL LOW (ref 264–916)

## 2021-09-19 LAB — PROSTATE-SPECIFIC AG, SERUM (LABCORP): Prostate Specific Ag, Serum: 9.2 ng/mL — ABNORMAL HIGH (ref 0.0–4.0)

## 2021-09-25 ENCOUNTER — Other Ambulatory Visit: Payer: Self-pay

## 2021-09-25 ENCOUNTER — Inpatient Hospital Stay (HOSPITAL_BASED_OUTPATIENT_CLINIC_OR_DEPARTMENT_OTHER): Payer: Medicare Other | Admitting: Oncology

## 2021-09-25 ENCOUNTER — Encounter: Payer: Self-pay | Admitting: *Deleted

## 2021-09-25 VITALS — BP 123/83 | HR 84 | Temp 98.1°F | Resp 18 | Ht 71.0 in | Wt 197.3 lb

## 2021-09-25 DIAGNOSIS — C61 Malignant neoplasm of prostate: Secondary | ICD-10-CM | POA: Diagnosis not present

## 2021-09-25 NOTE — Progress Notes (Signed)
Hematology and Oncology Follow Up Visit ? ?Nathan Berg ?[id number] ?09-13-1955 66 y.o. ?09/25/2021 3:36 PM ?Nathan Berg, MDWilson, Nathan Flavors, MD  ? ?Principle Diagnosis: 66 year old man with advanced prostate cancer with disease to the bone diagnosed in July 2021.  He has castration-resistant after presenting with Gleason score 4+3 = 7 PSA of 9.1 in 2015. ? ? ?Prior Therapy: ?He is s/p robotic assisted laparoscopic radical prostatectomy and bilateral lymph node dissection in March 2015.  The final pathological staging was T2CN0.  His Gleason score was 4+3 = 7 in both lobes without any evidence of angiolymphatic invasion or extraprostatic extension.   ? ?  His PSA was up to 0.09 in October 2015.  He subsequently received salvage radiation under the care of Dr. Tammi Klippel after receiving 38.4 Pearline Cables in 38 fractions between May 06, 2014 and June 26, 2014.  He has received 6 months of androgen deprivation utilizing Lupron. ?  ?His a PSA was 0.086 in 2017 and the testosterone level was low at that time despite stopping androgen deprivation.  He subsequently started on testosterone replacement therapy with improvement in his testosterone level.  In 2018 his PSA remained low at 0.2 and subsequently 0.68.  In 2019 his PSA started to rise up to 0.94 in April 2019.  His PSA in October 2019 was 1.07, in December 2019 his PSA was 0.83, in July 2020 his PSA was 1.27, in October 2020 his PSA was 2.35 with a testosterone level of 245.  He has been off testosterone therapy. ? ? ?PSA in March 2021 was 3.92 and in June 2021 was 8.18.  He was found to have oligometastatic disease. ? ?He is status post radiation therapy between August 11 and March 02, 2020.  Right iliac crest and the right internal iliac lymph node. ? ?He developed metastasis based on pet imaging on September 16, 2020. ? ?Zytiga 1000 mg with prednisone given in July 2022 while he is obtaining coverage for Xtandi.  ? ?Current therapy:  ? ? ?Eligard 30 mg started on September 18, 2020 every 4 months.  Next injection will be given if his testosterone starts to rise. ? ?Xtandi 160 mg daily started in August 2022. ? ?Interim History: [name] returns today for a follow-up visit.  Since the last visit, he reports no major changes in his health.  He continues to have multiple complaints including arthralgias, myalgias and mood disorder.  He has issues with depression anxiety and a lot of stress in his life.  Despite that he remains reasonably active and attends to activities of daily living.  He denies any worsening bone pain pathological fractures.  He denies any worsening neuropathy. ? ? ? ? ?Medications: Reviewed without changes. ?Current Outpatient Medications  ?Medication Sig Dispense Refill  ? ALPRAZolam (XANAX) 1 MG tablet TAKE 1/2 TO 1 TABLET BY MOUTH TWICE DAILY AS NEEDED FOR ANXIETY. MUST HAVE OFFICE OR TELEMEDICINE VISIT FOR REFILLS 30 tablet 0  ? buPROPion (WELLBUTRIN SR) 200 MG 12 hr tablet Take 200 mg by mouth 2 (two) times daily.    ? escitalopram (LEXAPRO) 20 MG tablet Take by mouth.    ? gabapentin (NEURONTIN) 300 MG capsule Take 1 capsule (300 mg total) by mouth daily. 90 capsule 3  ? traZODone (DESYREL) 100 MG tablet TAKE DAILY AS DIRECTED FOR INSOMNIA (Patient not taking: Reported on 04/01/2020)    ? traZODone (DESYREL) 25 mg TABS tablet Take 25 mg by mouth at bedtime.    ? ?No  current facility-administered medications for this visit.  ? ? ? ?Allergies:  ?Allergies  ?Allergen Reactions  ? Codeine Nausea And Vomiting  ? Penicillin G   ?  Other reaction(s): stomach upset  ? ? ? ? ?Physical Exam: ? ? ? ? ?Blood pressure 123/83, pulse 84, temperature 98.1 ?F (36.7 ?C), temperature source Temporal, resp. rate 18, height '5\' 11"'$  (1.803 m), weight 197 lb 4.8 oz (89.5 kg), SpO2 98 %. ? ? ? ? ? ? ?ECOG: 0 ? ? ? ?General appearance: Comfortable appearing without any discomfort ?Head: Normocephalic without any trauma ?Oropharynx: Mucous membranes are moist and pink without any thrush  or ulcers. ?Eyes: Pupils are equal and round reactive to light. ?Lymph nodes: No cervical, supraclavicular, inguinal or axillary lymphadenopathy.   ?Heart:regular rate and rhythm.  S1 and S2 without leg edema. ?Lung: Clear without any rhonchi or wheezes.  No dullness to percussion. ?Abdomin: Soft, nontender, nondistended with good bowel sounds.  No hepatosplenomegaly. ?Musculoskeletal: No joint deformity or effusion.  Full range of motion noted. ?Neurological: No deficits noted on motor, sensory and deep tendon reflex exam. ?Skin: No petechial rash or dryness.  Appeared moist.  ? ? ? ? ? ? ? ? ? ? ? ?Lab Results: ?Lab Results  ?Component Value Date  ? WBC 3.2 (L) 09/18/2021  ? HGB 13.2 09/18/2021  ? HCT 39.7 09/18/2021  ? MCV 91.7 09/18/2021  ? PLT 275 09/18/2021  ? ?  Chemistry   ?   ?Component Value Date/Time  ? NA 138 09/18/2021 1541  ? K 4.6 09/18/2021 1541  ? CL 103 09/18/2021 1541  ? CO2 30 09/18/2021 1541  ? BUN 17 09/18/2021 1541  ? CREATININE 1.00 09/18/2021 1541  ?    ?Component Value Date/Time  ? CALCIUM 8.8 (L) 09/18/2021 1541  ? ALKPHOS 111 09/18/2021 1541  ? AST 15 09/18/2021 1541  ? ALT 11 09/18/2021 1541  ? BILITOT 0.3 09/18/2021 1541  ?  ? ? ? ? ? ? Latest Reference Range & Units 06/17/21 12:02 07/31/21 15:38 09/18/21 15:41  ?Prostate Specific Ag, Serum 0.0 - 4.0 ng/mL 7.6 (H) 8.7 (H) 9.2 (H)  ?(H): Data is abnormally high ? ? ? ?Impression and Plan: ? ?66 year old man with: ?  ?1.   Advanced prostate cancer with disease to the bone and lymphadenopathy documented in 2021.  He has castration-resistant at this time. ? ? ?Treatment choices were reviewed at this time and his disease status was updated.  His PSA continues to show slow rise at this time despite no dramatic changes on his PSMA PET scan obtained in February 2023.  Treatment options moving forward including switching to Taxotere chemotherapy versus continuing the same dose and scheduling. ? ?After discussion today, given the low rise in his  PSA and the low doubling time we have recommended continued the same dose and regimen.  Different salvage therapy options including Taxotere chemotherapy will be considered if his doubling time count less than 6 months. ? ?2.  Androgen deprivation therapy: His testosterone level remains low currently at 9 and does not require any additional androgen deprivation. ? ? ?3.  Bone directed therapy: He is currently on calcium and vitamin D supplements.  Delton See has been deferred for the time being. ? ? ?4.  Bone pain: He is currently on Neurontin with pain is manageable. ? ?5.  Anxiety: He continues to follow with psychiatry regarding this mood disorder.   ?  ?6.   Follow-up: In 6 weeks for repeat  laboratory testing and MD follow-up in 3 months. ?  ?  ?30  minutes were spent on this encounter.  The time was dedicated to reviewing laboratory data, disease status update, treatment choices and future plan of care discussion. ? ?Zola Button, MD ?3/24/20233:36 PM ? ?

## 2021-10-20 IMAGING — PT NM PET TUM IMG SKULL BASE T - THIGH
1 of 7 series · 1 of 25 positions shown · non-contrast
Comparison: PSMA PET scan 09/16/2020

CLINICAL DATA: Prostate carcinoma with biochemical recurrence.
Radical prostatectomy 9735. Salvage radiation. Castrate sensitive
advanced prostate cancer.

EXAM:
NUCLEAR MEDICINE PET SKULL BASE TO THIGH
TECHNIQUE: 9.7 mCi F18 Piflufolastat (Pylarify) was injected intravenously.
Full-ring PET imaging was performed from the skull base to thigh
after the radiotracer. CT data was obtained and used for attenuation
correction and anatomic localization.

[Series 4: ct sk_thigh 5.0 bf37 · axial · 5.0mm · 0.98mm/px · 1 of 260 slices shown]
[im 208/260  brain]
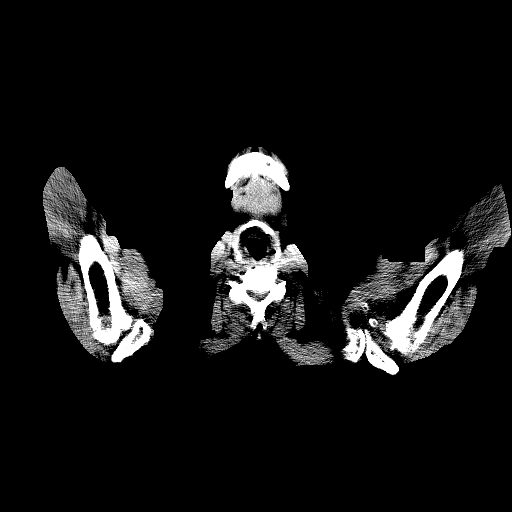

[1 of 25 positions shown; findings below may reference images not displayed]

FINDINGS: NECK

No radiotracer activity in neck lymph nodes.

Incidental CT finding: None

CHEST

No radiotracer accumulation within mediastinal or hilar lymph nodes.
No suspicious pulmonary nodules on the CT scan.

Incidental CT finding: None

ABDOMEN/PELVIS

Prostate: No focal activity in the prostate bed.

Lymph nodes: Decreased radiotracer activity within the solitary
RIGHT external iliac lymph node with SUV max equal 3.9 (image 186
fused data set) compared with SUV max equal 6.4. No new adenopathy
in the pelvis or periaortic retroperitoneum.

Liver: No evidence of liver metastasis

Incidental CT finding: None

SKELETON

Again demonstrated multifocal intense radiotracer activity within
the axillary skeleton.

Example:

Intense radiotracer activity associated with the LEFT sacral ala
lesion with SUV max equal 27 compared SUV max equal 23. Sclerotic
lesion at this location measures 14 mm (image 178/CT series 4)
compared to 8 mm.

RIGHT transverse process lesion at L1 with SUV max equal
compared SUV max equal 10.3 (image 142 fused data set).

Lesion in the posteromedial LEFT fifth rib with SUV max equal
compared SUV max equal 37 (fused image 85)

Intense radiotracer activity in the T3 vertebral body with SUV max
equal 126 compared SUV max equal 41. Minimal change on the CT
portion exam.

Intense radiotracer activity in the LEFT posterior elements of the
C3 vertebral body with SUV max equal 51 compared SUV max equal 27.

Additional scattered small lesions within the thoracic and lumbar
spine.
IMPRESSION: 1. Again demonstrate multifocal intense radiotracer avid skeletal
metastasis. No new metastatic lesions are identified; however, many
of the lesions have increased significantly in radiotracer activity.
Minimal change on the CT portion exam.
2. Decreased activity of solitary RIGHT external iliac node.

## 2021-11-04 ENCOUNTER — Telehealth: Payer: Self-pay | Admitting: Oncology

## 2021-11-04 NOTE — Telephone Encounter (Signed)
Called patient regarding upcoming May and June appointments, left a voicemail. ?

## 2021-11-09 ENCOUNTER — Inpatient Hospital Stay: Payer: Medicare Other | Attending: Oncology

## 2021-11-09 ENCOUNTER — Other Ambulatory Visit: Payer: Self-pay

## 2021-11-09 DIAGNOSIS — C61 Malignant neoplasm of prostate: Secondary | ICD-10-CM | POA: Insufficient documentation

## 2021-11-09 LAB — CBC WITH DIFFERENTIAL (CANCER CENTER ONLY)
Abs Immature Granulocytes: 0.01 10*3/uL (ref 0.00–0.07)
Basophils Absolute: 0 10*3/uL (ref 0.0–0.1)
Basophils Relative: 1 %
Eosinophils Absolute: 0.1 10*3/uL (ref 0.0–0.5)
Eosinophils Relative: 3 %
HCT: 39.8 % (ref 39.0–52.0)
Hemoglobin: 13.3 g/dL (ref 13.0–17.0)
Immature Granulocytes: 0 %
Lymphocytes Relative: 35 %
Lymphs Abs: 1.4 10*3/uL (ref 0.7–4.0)
MCH: 30.2 pg (ref 26.0–34.0)
MCHC: 33.4 g/dL (ref 30.0–36.0)
MCV: 90.5 fL (ref 80.0–100.0)
Monocytes Absolute: 0.6 10*3/uL (ref 0.1–1.0)
Monocytes Relative: 15 %
Neutro Abs: 1.9 10*3/uL (ref 1.7–7.7)
Neutrophils Relative %: 46 %
Platelet Count: 275 10*3/uL (ref 150–400)
RBC: 4.4 MIL/uL (ref 4.22–5.81)
RDW: 12.8 % (ref 11.5–15.5)
WBC Count: 4.1 10*3/uL (ref 4.0–10.5)
nRBC: 0 % (ref 0.0–0.2)

## 2021-11-09 LAB — CMP (CANCER CENTER ONLY)
ALT: 10 U/L (ref 0–44)
AST: 14 U/L — ABNORMAL LOW (ref 15–41)
Albumin: 3.7 g/dL (ref 3.5–5.0)
Alkaline Phosphatase: 107 U/L (ref 38–126)
Anion gap: 4 — ABNORMAL LOW (ref 5–15)
BUN: 18 mg/dL (ref 8–23)
CO2: 28 mmol/L (ref 22–32)
Calcium: 8.8 mg/dL — ABNORMAL LOW (ref 8.9–10.3)
Chloride: 108 mmol/L (ref 98–111)
Creatinine: 0.93 mg/dL (ref 0.61–1.24)
GFR, Estimated: >60 mL/min (ref >60)
Glucose, Bld: 76 mg/dL (ref 70–99)
Potassium: 4.5 mmol/L (ref 3.5–5.1)
Sodium: 140 mmol/L (ref 135–145)
Total Bilirubin: 0.4 mg/dL (ref 0.3–1.2)
Total Protein: 6.5 g/dL (ref 6.5–8.1)

## 2021-11-10 LAB — PROSTATE-SPECIFIC AG, SERUM (LABCORP): Prostate Specific Ag, Serum: 10 ng/mL — ABNORMAL HIGH (ref 0.0–4.0)

## 2021-11-10 LAB — TESTOSTERONE: Testosterone: 7 ng/dL — ABNORMAL LOW (ref 264–916)

## 2021-12-16 ENCOUNTER — Inpatient Hospital Stay: Payer: Medicare Other | Attending: Oncology

## 2021-12-16 ENCOUNTER — Other Ambulatory Visit: Payer: Self-pay

## 2021-12-16 DIAGNOSIS — C61 Malignant neoplasm of prostate: Secondary | ICD-10-CM

## 2021-12-16 DIAGNOSIS — M2550 Pain in unspecified joint: Secondary | ICD-10-CM | POA: Diagnosis not present

## 2021-12-16 DIAGNOSIS — Z79899 Other long term (current) drug therapy: Secondary | ICD-10-CM | POA: Diagnosis not present

## 2021-12-16 DIAGNOSIS — C7951 Secondary malignant neoplasm of bone: Secondary | ICD-10-CM | POA: Diagnosis not present

## 2021-12-16 DIAGNOSIS — M791 Myalgia, unspecified site: Secondary | ICD-10-CM | POA: Insufficient documentation

## 2021-12-16 LAB — CBC WITH DIFFERENTIAL (CANCER CENTER ONLY)
Abs Immature Granulocytes: 0.01 10*3/uL (ref 0.00–0.07)
Basophils Absolute: 0.1 10*3/uL (ref 0.0–0.1)
Basophils Relative: 1 %
Eosinophils Absolute: 0.2 10*3/uL (ref 0.0–0.5)
Eosinophils Relative: 4 %
HCT: 39.6 % (ref 39.0–52.0)
Hemoglobin: 13.5 g/dL (ref 13.0–17.0)
Immature Granulocytes: 0 %
Lymphocytes Relative: 26 %
Lymphs Abs: 1 10*3/uL (ref 0.7–4.0)
MCH: 31 pg (ref 26.0–34.0)
MCHC: 34.1 g/dL (ref 30.0–36.0)
MCV: 91 fL (ref 80.0–100.0)
Monocytes Absolute: 0.4 10*3/uL (ref 0.1–1.0)
Monocytes Relative: 11 %
Neutro Abs: 2.2 10*3/uL (ref 1.7–7.7)
Neutrophils Relative %: 58 %
Platelet Count: 247 10*3/uL (ref 150–400)
RBC: 4.35 MIL/uL (ref 4.22–5.81)
RDW: 12.8 % (ref 11.5–15.5)
WBC Count: 3.8 10*3/uL — ABNORMAL LOW (ref 4.0–10.5)
nRBC: 0 % (ref 0.0–0.2)

## 2021-12-16 LAB — CMP (CANCER CENTER ONLY)
ALT: 10 U/L (ref 0–44)
AST: 14 U/L — ABNORMAL LOW (ref 15–41)
Albumin: 3.7 g/dL (ref 3.5–5.0)
Alkaline Phosphatase: 98 U/L (ref 38–126)
Anion gap: 5 (ref 5–15)
BUN: 16 mg/dL (ref 8–23)
CO2: 30 mmol/L (ref 22–32)
Calcium: 9 mg/dL (ref 8.9–10.3)
Chloride: 106 mmol/L (ref 98–111)
Creatinine: 0.97 mg/dL (ref 0.61–1.24)
GFR, Estimated: >60 mL/min (ref >60)
Glucose, Bld: 103 mg/dL — ABNORMAL HIGH (ref 70–99)
Potassium: 4 mmol/L (ref 3.5–5.1)
Sodium: 141 mmol/L (ref 135–145)
Total Bilirubin: 0.3 mg/dL (ref 0.3–1.2)
Total Protein: 6.3 g/dL — ABNORMAL LOW (ref 6.5–8.1)

## 2021-12-17 ENCOUNTER — Other Ambulatory Visit: Payer: Medicare Other

## 2021-12-17 LAB — TESTOSTERONE: Testosterone: 15 ng/dL — ABNORMAL LOW (ref 264–916)

## 2021-12-17 LAB — PROSTATE-SPECIFIC AG, SERUM (LABCORP): Prostate Specific Ag, Serum: 10.8 ng/mL — ABNORMAL HIGH (ref 0.0–4.0)

## 2021-12-18 ENCOUNTER — Inpatient Hospital Stay (HOSPITAL_BASED_OUTPATIENT_CLINIC_OR_DEPARTMENT_OTHER): Payer: Medicare Other | Admitting: Oncology

## 2021-12-18 ENCOUNTER — Inpatient Hospital Stay: Payer: Medicare Other

## 2021-12-18 ENCOUNTER — Other Ambulatory Visit: Payer: Self-pay

## 2021-12-18 VITALS — BP 110/75 | HR 81 | Temp 97.8°F | Resp 19 | Ht 71.0 in | Wt 198.9 lb

## 2021-12-18 DIAGNOSIS — C61 Malignant neoplasm of prostate: Secondary | ICD-10-CM

## 2021-12-18 MED ORDER — GABAPENTIN 300 MG PO CAPS: ORAL_CAPSULE | ORAL | 3 refills | Status: DC

## 2021-12-18 NOTE — Progress Notes (Signed)
Hematology and Oncology Follow Up Visit  Nathan Berg [id number] 03-05-1956 66 y.o. 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  He presented in 2015 with Gleason score 4+3 = 7 PSA of 9.1  Prior Therapy: He is s/p robotic assisted laparoscopic radical prostatectomy and bilateral lymph node dissection in March 2015.  The final pathological staging was T2CN0.  His Gleason score was 4+3 = 7 in both lobes without any evidence of angiolymphatic invasion or extraprostatic extension.      His PSA was up to 0.09 in October 2015.  He subsequently received salvage radiation under the care of Dr. Tammi Klippel after receiving 38.4 Pearline Cables in 38 fractions between May 06, 2014 and June 26, 2014.  He has received 6 months of androgen deprivation utilizing Lupron.   His a PSA was 0.086 in 2017 and the testosterone level was low at that time despite stopping androgen deprivation.  He subsequently started on testosterone replacement therapy with improvement in his testosterone level.  In 2018 his PSA remained low at 0.2 and subsequently 0.68.  In 2019 his PSA started to rise up to 0.94 in April 2019.  His PSA in October 2019 was 1.07, in December 2019 his PSA was 0.83, in July 2020 his PSA was 1.27, in October 2020 his PSA was 2.35 with a testosterone level of 245.  He has been off testosterone therapy.   PSA in March 2021 was 3.92 and in June 2021 was 8.18.  He was found to have oligometastatic disease.  He is status post radiation therapy between August 11 and March 02, 2020.  Right iliac crest and the right internal iliac lymph node.  He developed metastasis based on pet imaging on September 16, 2020.  Zytiga 1000 mg with prednisone given in July 2022 while he is obtaining coverage for Xtandi.   Current therapy:    Eligard 30 mg started on September 18, 2020  every 4 months.  Next injection will be given if his testosterone starts to rise.  Xtandi 160 mg daily started in August 2022.  Interim History: Mr. Nathan Berg is here for repeat evaluation.  Since last visit, he reports no major changes in his health.  He continues to fall issues with arthralgias and myalgias and currently on Neurontin which helps but feels that the dose is adequate at times.  He denies any nausea or vomiting.  Denies any abdominal pain or discomfort.     Medications: Updated on review. Current Outpatient Medications  Medication Sig Dispense Refill   ALPRAZolam (XANAX) 1 MG tablet TAKE 1/2 TO 1 TABLET BY MOUTH TWICE DAILY AS NEEDED FOR ANXIETY. MUST HAVE OFFICE OR TELEMEDICINE VISIT FOR REFILLS 30 tablet 0   buPROPion (WELLBUTRIN SR) 200 MG 12 hr tablet Take 200 mg by mouth 2 (two) times daily.     escitalopram (LEXAPRO) 20 MG tablet Take by mouth.     gabapentin (NEURONTIN) 300 MG capsule Take 1 capsule (300 mg total) by mouth daily. 90 capsule 3   traZODone (DESYREL) 100 MG tablet TAKE DAILY AS DIRECTED FOR INSOMNIA (Patient not taking: Reported on 04/01/2020)     traZODone (DESYREL) 25 mg TABS tablet Take 25 mg by mouth at bedtime.     No current facility-administered medications for this visit.     Allergies:  Allergies  Allergen Reactions   Codeine Nausea And Vomiting  Penicillin G     Other reaction(s): stomach upset      Physical Exam:       Blood pressure 110/75, pulse 81, temperature 97.8 F (36.6 C), temperature source Temporal, resp. rate 19, height '5\' 11"'$  (1.803 m), weight 198 lb 14.4 oz (90.2 kg), SpO2 95 %.      ECOG: 1     General appearance: Alert, awake without any distress. Head: Atraumatic without abnormalities Oropharynx: Without any thrush or ulcers. Eyes: No scleral icterus. Lymph nodes: No lymphadenopathy noted in the cervical, supraclavicular, or axillary nodes Heart:regular rate and rhythm, without any murmurs or gallops.    Lung: Clear to auscultation without any rhonchi, wheezes or dullness to percussion. Abdomin: Soft, nontender without any shifting dullness or ascites. Musculoskeletal: No clubbing or cyanosis. Neurological: No motor or sensory deficits. Skin: No rashes or lesions.            Lab Results: Lab Results  Component Value Date   WBC 3.8 (L) 12/16/2021   HGB 13.5 12/16/2021   HCT 39.6 12/16/2021   MCV 91.0 12/16/2021   PLT 247 12/16/2021     Chemistry      Component Value Date/Time   NA 141 12/16/2021 1111   K 4.0 12/16/2021 1111   CL 106 12/16/2021 1111   CO2 30 12/16/2021 1111   BUN 16 12/16/2021 1111   CREATININE 0.97 12/16/2021 1111      Component Value Date/Time   CALCIUM 9.0 12/16/2021 1111   ALKPHOS 98 12/16/2021 1111   AST 14 (L) 12/16/2021 1111   ALT 10 12/16/2021 1111   BILITOT 0.3 12/16/2021 1111          Latest Reference Range & Units 09/18/21 15:41 11/09/21 13:02 12/16/21 11:11  Prostate Specific Ag, Serum 0.0 - 4.0 ng/mL 9.2 (H) 10.0 (H) 10.8 (H)  (H): Data is abnormally high    Impression and Plan:  66 year old man with:   1.   Castration-resistant advanced prostate cancer with disease to the bone and lymphadenopathy documented in 2021.     His disease status was updated at this time and treatment choices were reviewed.  He is currently on Xtandi with overall stable PSA and a slight modest rise.  Risks and benefits of continuing this treatment versus switching to different salvage therapy option were discussed.  These include systemic chemotherapy or a PARP inhibitor.  After discussion today, given the modest rise in his PSA and overall stability of his disease we will continue with the same dose and schedule and we will update his staging scan and 3 months.  2.  Androgen deprivation therapy: His testosterone level continues to be at a castrate level of 15.  Androgen deprivation therapy will be initiated testosterone 50  3.  Bone directed  therapy: I recommended calcium and vitamin D be continued.   4.  Bone pain: He is currently on Neurontin pain is manageable for these most part.  I will increase his Neurontin dose for 600 mg in the daytime, 300 mg in the afternoon and 300 at nighttime.  5.  Anxiety: Mood is stable at this time.   6.   Follow-up: In 3 months for repeat evaluation.     30  minutes were spent on this encounter.  The time was dedicated to reviewing laboratory data, disease status update and outlining future plan of care discussion.  Zola Button, MD 6/16/20233:26 PM

## 2021-12-28 ENCOUNTER — Telehealth: Payer: Self-pay | Admitting: Oncology

## 2021-12-28 ENCOUNTER — Other Ambulatory Visit: Payer: Self-pay | Admitting: Oncology

## 2021-12-28 ENCOUNTER — Other Ambulatory Visit: Payer: Self-pay | Admitting: *Deleted

## 2021-12-28 MED ORDER — GABAPENTIN 300 MG PO CAPS: ORAL_CAPSULE | ORAL | 3 refills | Status: DC

## 2022-01-29 ENCOUNTER — Other Ambulatory Visit: Payer: Self-pay

## 2022-01-29 ENCOUNTER — Inpatient Hospital Stay: Payer: Medicare Other | Attending: Oncology

## 2022-01-29 DIAGNOSIS — C61 Malignant neoplasm of prostate: Secondary | ICD-10-CM | POA: Diagnosis present

## 2022-01-29 LAB — CMP (CANCER CENTER ONLY)
ALT: 9 U/L (ref 0–44)
AST: 14 U/L — ABNORMAL LOW (ref 15–41)
Albumin: 3.9 g/dL (ref 3.5–5.0)
Alkaline Phosphatase: 101 U/L (ref 38–126)
Anion gap: 4 — ABNORMAL LOW (ref 5–15)
BUN: 19 mg/dL (ref 8–23)
CO2: 29 mmol/L (ref 22–32)
Calcium: 8.7 mg/dL — ABNORMAL LOW (ref 8.9–10.3)
Chloride: 105 mmol/L (ref 98–111)
Creatinine: 0.89 mg/dL (ref 0.61–1.24)
GFR, Estimated: >60 mL/min (ref >60)
Glucose, Bld: 86 mg/dL (ref 70–99)
Potassium: 4.8 mmol/L (ref 3.5–5.1)
Sodium: 138 mmol/L (ref 135–145)
Total Bilirubin: 0.5 mg/dL (ref 0.3–1.2)
Total Protein: 6.5 g/dL (ref 6.5–8.1)

## 2022-01-29 LAB — CBC WITH DIFFERENTIAL (CANCER CENTER ONLY)
Abs Immature Granulocytes: 0.01 10*3/uL (ref 0.00–0.07)
Basophils Absolute: 0 10*3/uL (ref 0.0–0.1)
Basophils Relative: 1 %
Eosinophils Absolute: 0.1 10*3/uL (ref 0.0–0.5)
Eosinophils Relative: 2 %
HCT: 40.2 % (ref 39.0–52.0)
Hemoglobin: 13.8 g/dL (ref 13.0–17.0)
Immature Granulocytes: 0 %
Lymphocytes Relative: 30 %
Lymphs Abs: 1.2 10*3/uL (ref 0.7–4.0)
MCH: 31.2 pg (ref 26.0–34.0)
MCHC: 34.3 g/dL (ref 30.0–36.0)
MCV: 90.7 fL (ref 80.0–100.0)
Monocytes Absolute: 0.5 10*3/uL (ref 0.1–1.0)
Monocytes Relative: 11 %
Neutro Abs: 2.2 10*3/uL (ref 1.7–7.7)
Neutrophils Relative %: 56 %
Platelet Count: 260 10*3/uL (ref 150–400)
RBC: 4.43 MIL/uL (ref 4.22–5.81)
RDW: 12.9 % (ref 11.5–15.5)
WBC Count: 4 10*3/uL (ref 4.0–10.5)
nRBC: 0 % (ref 0.0–0.2)

## 2022-01-30 LAB — PROSTATE-SPECIFIC AG, SERUM (LABCORP): Prostate Specific Ag, Serum: 12.7 ng/mL — ABNORMAL HIGH (ref 0.0–4.0)

## 2022-01-30 LAB — TESTOSTERONE: Testosterone: 22 ng/dL — ABNORMAL LOW (ref 264–916)

## 2022-03-11 ENCOUNTER — Encounter (HOSPITAL_COMMUNITY)
Admission: RE | Admit: 2022-03-11 | Discharge: 2022-03-11 | Disposition: A | Discharge status: Home / Self Care | Payer: Medicare Other | Source: Ambulatory Visit | Attending: Oncology | Admitting: Oncology

## 2022-03-11 DIAGNOSIS — C61 Malignant neoplasm of prostate: Secondary | ICD-10-CM | POA: Diagnosis present

## 2022-03-11 MED ORDER — PIFLIFOLASTAT F 18 (PYLARIFY) INJECTION
9.0000 | Freq: Once | INTRAVENOUS | Status: AC
  Administered 2022-03-11: 9.5 via INTRAVENOUS

## 2022-03-22 ENCOUNTER — Other Ambulatory Visit: Payer: Self-pay

## 2022-03-22 ENCOUNTER — Inpatient Hospital Stay: Payer: Medicare Other

## 2022-03-22 ENCOUNTER — Inpatient Hospital Stay: Payer: Medicare Other | Attending: Oncology

## 2022-03-22 DIAGNOSIS — C7951 Secondary malignant neoplasm of bone: Secondary | ICD-10-CM | POA: Insufficient documentation

## 2022-03-22 DIAGNOSIS — C61 Malignant neoplasm of prostate: Secondary | ICD-10-CM | POA: Insufficient documentation

## 2022-03-22 LAB — CBC WITH DIFFERENTIAL (CANCER CENTER ONLY)
Abs Immature Granulocytes: 0.02 10*3/uL (ref 0.00–0.07)
Basophils Absolute: 0 10*3/uL (ref 0.0–0.1)
Basophils Relative: 1 %
Eosinophils Absolute: 0.1 10*3/uL (ref 0.0–0.5)
Eosinophils Relative: 3 %
HCT: 38.8 % — ABNORMAL LOW (ref 39.0–52.0)
Hemoglobin: 13.3 g/dL (ref 13.0–17.0)
Immature Granulocytes: 1 %
Lymphocytes Relative: 36 %
Lymphs Abs: 1.2 10*3/uL (ref 0.7–4.0)
MCH: 30.9 pg (ref 26.0–34.0)
MCHC: 34.3 g/dL (ref 30.0–36.0)
MCV: 90 fL (ref 80.0–100.0)
Monocytes Absolute: 0.5 10*3/uL (ref 0.1–1.0)
Monocytes Relative: 14 %
Neutro Abs: 1.6 10*3/uL — ABNORMAL LOW (ref 1.7–7.7)
Neutrophils Relative %: 45 %
Platelet Count: 236 10*3/uL (ref 150–400)
RBC: 4.31 MIL/uL (ref 4.22–5.81)
RDW: 13.3 % (ref 11.5–15.5)
WBC Count: 3.4 10*3/uL — ABNORMAL LOW (ref 4.0–10.5)
nRBC: 0 % (ref 0.0–0.2)

## 2022-03-22 LAB — CMP (CANCER CENTER ONLY)
ALT: 18 U/L (ref 0–44)
AST: 18 U/L (ref 15–41)
Albumin: 3.7 g/dL (ref 3.5–5.0)
Alkaline Phosphatase: 91 U/L (ref 38–126)
Anion gap: 9 (ref 5–15)
BUN: 12 mg/dL (ref 8–23)
CO2: 23 mmol/L (ref 22–32)
Calcium: 8.7 mg/dL — ABNORMAL LOW (ref 8.9–10.3)
Chloride: 105 mmol/L (ref 98–111)
Creatinine: 0.88 mg/dL (ref 0.61–1.24)
GFR, Estimated: >60 mL/min (ref >60)
Glucose, Bld: 128 mg/dL — ABNORMAL HIGH (ref 70–99)
Potassium: 4.1 mmol/L (ref 3.5–5.1)
Sodium: 137 mmol/L (ref 135–145)
Total Bilirubin: 0.4 mg/dL (ref 0.3–1.2)
Total Protein: 6.4 g/dL — ABNORMAL LOW (ref 6.5–8.1)

## 2022-03-23 LAB — TESTOSTERONE: Testosterone: 11 ng/dL — ABNORMAL LOW (ref 264–916)

## 2022-03-23 LAB — PROSTATE-SPECIFIC AG, SERUM (LABCORP): Prostate Specific Ag, Serum: 13.5 ng/mL — ABNORMAL HIGH (ref 0.0–4.0)

## 2022-03-29 ENCOUNTER — Other Ambulatory Visit: Payer: Self-pay | Admitting: Oncology

## 2022-03-30 ENCOUNTER — Inpatient Hospital Stay (HOSPITAL_BASED_OUTPATIENT_CLINIC_OR_DEPARTMENT_OTHER): Payer: Medicare Other | Admitting: Oncology

## 2022-03-30 ENCOUNTER — Encounter: Payer: Self-pay | Admitting: Oncology

## 2022-03-30 ENCOUNTER — Other Ambulatory Visit: Payer: Self-pay

## 2022-03-30 VITALS — BP 128/74 | HR 65 | Temp 97.8°F | Resp 18 | Ht 71.0 in | Wt 194.5 lb

## 2022-03-30 DIAGNOSIS — C61 Malignant neoplasm of prostate: Secondary | ICD-10-CM | POA: Diagnosis not present

## 2022-03-30 NOTE — Progress Notes (Signed)
Hematology and Oncology Follow Up Visit  Nathan Berg [id number] [birth year] 66 y.o. 03/30/2022 8:20 AM Nathan Berg, MDWilson, Nathan Flavors, MD   Principle Diagnosis: 66 year old man with advanced prostate cancer with disease to the bone diagnosed in July 2021.  He has castration-resistant after presenting in 2015 with Gleason score 4+3 = 7 PSA of 9.1  Prior Therapy: He is s/p robotic assisted laparoscopic radical prostatectomy and bilateral lymph node dissection in March 2015.  The final pathological staging was T2CN0.  His Gleason score was 4+3 = 7 in both lobes without any evidence of angiolymphatic invasion or extraprostatic extension.      His PSA was up to 0.09 in October 2015.  He subsequently received salvage radiation under the care of Dr. Tammi Klippel after receiving 38.4 Pearline Cables in 38 fractions between May 06, 2014 and June 26, 2014.  He has received 6 months of androgen deprivation utilizing Lupron.   His a PSA was 0.086 in 2017 and the testosterone level was low at that time despite stopping androgen deprivation.  He subsequently started on testosterone replacement therapy with improvement in his testosterone level.  In 2018 his PSA remained low at 0.2 and subsequently 0.68.  In 2019 his PSA started to rise up to 0.94 in April 2019.  His PSA in October 2019 was 1.07, in December 2019 his PSA was 0.83, in July 2020 his PSA was 1.27, in October 2020 his PSA was 2.35 with a testosterone level of 245.  He has been off testosterone therapy.   PSA in March 2021 was 3.92 and in June 2021 was 8.18.  He was found to have oligometastatic disease.  He is status post radiation therapy between August 11 and March 02, 2020.  Right iliac crest and the right internal iliac lymph node.  He developed metastasis based on pet imaging on September 16, 2020.  Zytiga 1000 mg with prednisone given in July 2022 while he is obtaining coverage for Xtandi.   Current therapy:    Eligard 30 mg started on September 18, 2020.  He receives Eligard if his testosterone increases above 50.  Xtandi 160 mg daily started in August 2022.  Interim History: Nathan Berg returns today for a follow-up.  Since the last visit, he reports no major changes in his health.  He continues to tolerate Xtandi without any complaints.  He has reported some fatigue and tiredness and diffuse arthralgias.  He has reported pain in his spine and rib cage and occasional hips.  He had denies any nausea, vomiting or abdominal pain.  He has reported some occasional unsteadiness.     Medications: Reviewed without changes. Current Outpatient Medications  Medication Sig Dispense Refill   ALPRAZolam (XANAX) 1 MG tablet TAKE 1/2 TO 1 TABLET BY MOUTH TWICE DAILY AS NEEDED FOR ANXIETY. MUST HAVE OFFICE OR TELEMEDICINE VISIT FOR REFILLS 30 tablet 0   buPROPion (WELLBUTRIN SR) 200 MG 12 hr tablet Take 200 mg by mouth 2 (two) times daily.     escitalopram (LEXAPRO) 20 MG tablet Take by mouth.     gabapentin (NEURONTIN) 300 MG capsule Take 2 in the morning, 1 in the afternoon 1 at nighttime 120 capsule 3   traZODone (DESYREL) 100 MG tablet TAKE DAILY AS DIRECTED FOR INSOMNIA (Patient not taking: Reported on 04/01/2020)     traZODone (DESYREL) 25 mg TABS tablet Take 25 mg by mouth at bedtime.     XTANDI 40 MG tablet Take 4 tablets ('160mg'$ ) by mouth once  daily as directed by physician. 120 tablet 1   No current facility-administered medications for this visit.     Allergies:  Allergies  Allergen Reactions   Codeine Nausea And Vomiting   Penicillin G     Other reaction(s): stomach upset      Physical Exam:       Blood pressure 128/74, pulse 65, temperature 97.8 F (36.6 C), temperature source Temporal, resp. rate 18, height '5\' 11"'$  (1.803 m), weight 194 lb 8 oz (88.2 kg), SpO2 99 %.       ECOG: 1   General appearance: Comfortable appearing without any discomfort Head: Normocephalic without any trauma Oropharynx: Mucous  membranes are moist and pink without any thrush or ulcers. Eyes: Pupils are equal and round reactive to light. Lymph nodes: No cervical, supraclavicular, inguinal or axillary lymphadenopathy.   Heart:regular rate and rhythm.  S1 and S2 without leg edema. Lung: Clear without any rhonchi or wheezes.  No dullness to percussion. Abdomin: Soft, nontender, nondistended with good bowel sounds.  No hepatosplenomegaly. Musculoskeletal: No joint deformity or effusion.  Full range of motion noted. Neurological: No deficits noted on motor, sensory and deep tendon reflex exam. Skin: No petechial rash or dryness.  Appeared moist.             Lab Results: Lab Results  Component Value Date   WBC 3.4 (L) 03/22/2022   HGB 13.3 03/22/2022   HCT 38.8 (L) 03/22/2022   MCV 90.0 03/22/2022   PLT 236 03/22/2022     Chemistry      Component Value Date/Time   NA 137 03/22/2022 1400   K 4.1 03/22/2022 1400   CL 105 03/22/2022 1400   CO2 23 03/22/2022 1400   BUN 12 03/22/2022 1400   CREATININE 0.88 03/22/2022 1400      Component Value Date/Time   CALCIUM 8.7 (L) 03/22/2022 1400   ALKPHOS 91 03/22/2022 1400   AST 18 03/22/2022 1400   ALT 18 03/22/2022 1400   BILITOT 0.4 03/22/2022 1400        Latest Reference Range & Units 11/09/21 13:02 12/16/21 11:11 01/29/22 12:55 03/22/22 14:00  Prostate Specific Ag, Serum 0.0 - 4.0 ng/mL 10.0 (H) 10.8 (H) 12.7 (H) 13.5 (H)  (H): Data is abnormally high low    IMPRESSION: 1. Multifocal intense radiotracer avid skeletal metastasis again noted involving the axillary skeleton. No change in pattern or number of lesions. No new lesions identified. 2. The radiotracer activity of the lesions; however, is uniformly increased from comparison exam. 3. The sclerotic pattern of the lesions on comparison CT is a similar. One lesion at T3 as new lytic component. 4. No visceral metastasis.  No new nodal disease.  Impression and Plan:  66 year old man  with:   1.   Advanced prostate cancer with disease to the bone and lymphadenopathy diagnosed in 2021.  He has castration-resistant disease.   The natural course of his disease and treatment choices were discussed.  His PSA continues to rise slowly and PSMA PET scan showed multifocal increase in the skeletal metastasis although the change is not dramatic.  Treatment choices moving forward were discussed including continuing Xtandi, adding radiation therapy to areas of progression versus switching to Taxotere chemotherapy.  After discussion today, I have recommended repeat radiation to some of the areas that has progressed predominantly thoracic spine, rib cage and possibly the pelvic area.  This will help palliate his pain could also defer any additional systemic disease changes.  I have recommended  also obtaining Guardant360 analysis before the next visit to assess possibility of a PARP inhibitor.  2.  Androgen deprivation therapy: He is currently on Eligard which will be initiated if his testosterone is above 50.   3.  Bone directed therapy: He is continue calcium and vitamin D supplements.  Delton See has been deferred till he obtain dental clearance.   4.  Bone pain: Manageable at this time and will consider radiation therapy.  5.  Anxiety: His mood remains stable and no exacerbation.   6.   Follow-up: He will return in December 2023.     30  minutes were reviewed on this visit.  The time was dedicated to updating his disease status, treatment choices, reviewing imaging studies and future plan of care discussion.  Zola Button, MD 9/26/20238:20 AM

## 2022-04-12 DIAGNOSIS — C7951 Secondary malignant neoplasm of bone: Secondary | ICD-10-CM | POA: Insufficient documentation

## 2022-04-16 ENCOUNTER — Ambulatory Visit
Admission: RE | Admit: 2022-04-16 | Discharge: 2022-04-16 | Disposition: A | Discharge status: Home / Self Care | Payer: Medicare Other | Source: Ambulatory Visit | Attending: Radiation Oncology | Admitting: Radiation Oncology

## 2022-04-16 ENCOUNTER — Encounter: Payer: Self-pay | Admitting: Radiation Oncology

## 2022-04-16 ENCOUNTER — Ambulatory Visit
Admission: RE | Admit: 2022-04-16 | Discharge: 2022-04-16 | Disposition: A | Discharge status: Home / Self Care | Payer: Medicare Other | Source: Ambulatory Visit | Attending: Urology | Admitting: Urology

## 2022-04-16 VITALS — BP 117/79 | HR 79 | Temp 97.3°F | Resp 18 | Ht 71.0 in | Wt 195.0 lb

## 2022-04-16 DIAGNOSIS — Z510 Encounter for antineoplastic radiation therapy: Secondary | ICD-10-CM | POA: Diagnosis present

## 2022-04-16 DIAGNOSIS — C61 Malignant neoplasm of prostate: Secondary | ICD-10-CM | POA: Diagnosis present

## 2022-04-16 DIAGNOSIS — C7951 Secondary malignant neoplasm of bone: Secondary | ICD-10-CM | POA: Insufficient documentation

## 2022-04-16 DIAGNOSIS — Z923 Personal history of irradiation: Secondary | ICD-10-CM | POA: Insufficient documentation

## 2022-04-16 DIAGNOSIS — Z79899 Other long term (current) drug therapy: Secondary | ICD-10-CM | POA: Insufficient documentation

## 2022-04-16 NOTE — Progress Notes (Signed)
Nursing reconsult appointment for patient w/ oligometastatic prostate cancer to a pelvic lymph node and bone.  I verified patient's identity and began nursing interview. Patient reports urinary challenges (see I-PSS score), fatigue, and overall trembles from head to toe. No other related issues reported at this time.  Meaningful use complete. I-PSS score of 17-moderate. No urinary management medications. Urology appt- None currently Urologist- Dr. Louis Meckel at [facility]  BP 117/79 (BP Location: Left Arm, Patient Position: Sitting, Cuff Size: Normal)   Pulse 79   Temp (!) 97.3 F (36.3 C) (Temporal)   Resp 18   Ht '5\' 11"'$  (1.803 m)   Wt 195 lb (88.5 kg)   SpO2 95%   BMI 27.20 kg/m

## 2022-04-18 NOTE — Progress Notes (Signed)
  Radiation Oncology         [phone] ________________________________  Name: Nathan Berg MRN: [id number]  Date: 04/16/2022  DOB: 17-Jun-1956  SIMULATION AND TREATMENT PLANNING NOTE    ICD-10-CM   1. Metastasis to bone (HCC)  C79.51     2. 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  Identity was confirmed.  All relevant records and images related to the planned course of therapy were reviewed.  The patient freely provided informed written consent to proceed with treatment after reviewing the details related to the planned course of therapy. The consent form was witnessed and verified by the simulation staff.  Then, the patient was set-up in a stable reproducible  supine position for radiation therapy.  CT images were obtained.  Surface markings were placed.  The CT images were loaded into the planning software.  Then the target and avoidance structures were contoured.  Treatment planning then occurred.  The radiation prescription was entered and confirmed.  Then, I designed and supervised the construction of a total of multiple medically necessary complex treatment devices documented in [place].  I have requested : 3D Simulation  I have requested a DVH of the following structures: lungs, spinal cord, and target.    PLAN:  The patient will receive 30 Gy in 10 fractions.  ________________________________  Sheral Apley Tammi Klippel, M.D.

## 2022-04-18 NOTE — Progress Notes (Signed)
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 Malignant neoplasm of prostate ([place])  C61     2. Metastasis to bone [facility])  C79.51       Interval Since Last Radiation:  Interval Since Last Radiation:  5.5 weeks  02/13/20 - 02/22/20:  The right iliac crest and right internal iliac node targets were treated to 50 Gy in 5 fractions of 10 Gy each   05/06/14 - 06/26/14: Prostatic Fossa / 68.4 Gy in 38 fractions of 1.8 Gy (in combination with ST-ADT)  Narrative:  The patient returns today for routine referred back by Dr. Alen Blew for some progressive bone mets.  To review his history, he is s/p robotic assisted laparoscopic radical prostatectomy and bilateral lymph node dissection in March 2015.  The final pathological staging was T2cN0.  His Gleason score was 4+3 = 7 in both lobes without any evidence of angiolymphatic invasion or extraprostatic extension.     His PSA was up to 0.09 in October 2015.  He subsequently received salvage radiation under my supervision after receiving 68.4 Gray in 38 fractions between May 06, 2014 and June 26, 2014.  He has received 6 months of androgen deprivation utilizing Lupron.   His a PSA was 0.086 in 2017 and the testosterone level was low at that time despite stopping androgen deprivation.  He subsequently started on testosterone replacement therapy with improvement in his testosterone level.  In 2018 his PSA remained low at 0.2 and subsequently 0.68.  In 2019 his PSA started to rise up to 0.94 in April 2019.  His PSA in October 2019 was 1.07, in December 2019 his PSA was 0.83, in July 2020 his PSA was 1.27, in October 2020 his PSA was 2.35 with a testosterone level of 245.  He  has been off testosterone therapy.   PSA in March 2021 was 3.92 and in June 2021 was 8.18.  He was found to have oligometastatic disease.   He is status post radiation therapy between August 11 and March 02, 2020.  Right iliac crest and the right internal iliac lymph node.   He developed metastasis based on pet imaging on September 16, 2020.   Zytiga 1000 mg with prednisone given in July 2022 while he is obtaining coverage for Xtandi.    His current therapy includes the following:      Eligard 30 mg started on September 18, 2020.  He receives Eligard if his testosterone increases above 50.   Xtandi 160 mg daily started in August 2022.  Recently, Nathan Berg saw Dr Alen Blew for follow-up for a follow-up.  Since the last visit, he reports no major changes in his health.  He continues to tolerate Xtandi without any complaints.  He has reported some fatigue and tiredness and diffuse arthralgias.  He has reported pain in his spine and rib cage and occasional hips.  He had denies any nausea, vomiting or abdominal pain.  He has reported some occasional unsteadiness.  Dr. Alen Blew noted that his PSA continues to rise slowly and PSMA PET scan showed multifocal increase in the skeletal metastasis although the change is not dramatic.  Treatment choices moving forward were discussed  including continuing Xtandi, adding radiation therapy to areas of progression versus switching to Taxotere chemotherapy.   After discussion with Dr. Alen Blew, he has recommended repeat radiation to some of the areas that has progressed predominantly thoracic spine, rib cage and possibly the pelvic area.  This will help palliate his pain could also defer any additional systemic disease changes.  ALLERGIES:  is allergic to codeine and penicillin g.  Meds: Current Outpatient Medications  Medication Sig Dispense Refill   ALPRAZolam (XANAX) 1 MG tablet TAKE 1/2 TO 1 TABLET BY MOUTH TWICE DAILY AS NEEDED FOR ANXIETY. MUST HAVE OFFICE OR  TELEMEDICINE VISIT FOR REFILLS 30 tablet 0   buPROPion (WELLBUTRIN SR) 200 MG 12 hr tablet Take 200 mg by mouth 2 (two) times daily.     escitalopram (LEXAPRO) 20 MG tablet Take by mouth.     gabapentin (NEURONTIN) 300 MG capsule Take 2 in the morning, 1 in the afternoon 1 at nighttime 120 capsule 3   traZODone (DESYREL) 100 MG tablet TAKE DAILY AS DIRECTED FOR INSOMNIA (Patient not taking: Reported on 04/01/2020)     traZODone (DESYREL) 25 mg TABS tablet Take 25 mg by mouth at bedtime.     XTANDI 40 MG tablet Take 4 tablets ('160mg'$ ) by mouth once daily as directed by physician. 120 tablet 1   No current facility-administered medications for this encounter.    Physical Findings: The patient is in no acute distress. Patient is alert and oriented.  height is '5\' 11"'$  (1.803 m) and weight is 195 lb (88.5 kg). His temporal temperature is 97.3 F (36.3 C) (abnormal). His blood pressure is 117/79 and his pulse is 79. His respiration is 18 and oxygen saturation is 95%. .  No significant changes.  Lab Findings: Lab Results  Component Value Date   WBC 3.4 (L) 03/22/2022   WBC 8.0 07/17/2019   HGB 13.3 03/22/2022   HCT 38.8 (L) 03/22/2022   PLT 236 03/22/2022    Lab Results  Component Value Date   NA 137 03/22/2022   K 4.1 03/22/2022   CO2 23 03/22/2022   GLUCOSE 128 (H) 03/22/2022   BUN 12 03/22/2022   CREATININE 0.88 03/22/2022   BILITOT 0.4 03/22/2022   ALKPHOS 91 03/22/2022   AST 18 03/22/2022   ALT 18 03/22/2022   PROT 6.4 (L) 03/22/2022   ALBUMIN 3.7 03/22/2022   CALCIUM 8.7 (L) 03/22/2022   ANIONGAP 9 03/22/2022    Radiographic Findings: No results found.  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  Plan:  Today, I talked to the patient and family about the findings and work-up thus far.  We discussed the natural history of progressive bone metastases and general  treatment, highlighting the role of radiotherapy in the management.  We discussed the available radiation techniques, and focused on the details of logistics and delivery.  We reviewed the anticipated acute and late sequelae associated with radiation in this setting.  The patient was encouraged to ask questions that I answered to the best of my ability.    The patient would like to proceed with radiation and will be scheduled for CT simulation today.  We spent 45 minutes minutes face to face with the patient and more than 50% of that time was spent in counseling and/or coordination of care.     Nicholos Johns, PA-C    Tyler Pita, MD  [facility] Direct Dial: [phone]  Fax: [phone] [email or web address]  Skype  [email or web address]    Page Me

## 2022-04-19 DIAGNOSIS — Z510 Encounter for antineoplastic radiation therapy: Secondary | ICD-10-CM | POA: Diagnosis not present

## 2022-04-26 ENCOUNTER — Ambulatory Visit
Admission: RE | Admit: 2022-04-26 | Discharge: 2022-04-26 | Disposition: A | Discharge status: Home / Self Care | Payer: Medicare Other | Source: Ambulatory Visit | Attending: Radiation Oncology | Admitting: Radiation Oncology

## 2022-04-26 ENCOUNTER — Other Ambulatory Visit: Payer: Self-pay

## 2022-04-26 DIAGNOSIS — Z510 Encounter for antineoplastic radiation therapy: Secondary | ICD-10-CM | POA: Diagnosis not present

## 2022-04-26 LAB — RAD ONC ARIA SESSION SUMMARY
Course Elapsed Days: 0
Plan Fractions Treated to Date: 1
Plan Prescribed Dose Per Fraction: 3 Gy
Plan Total Fractions Prescribed: 10
Plan Total Prescribed Dose: 30 Gy
Reference Point Dosage Given to Date: 3 Gy
Reference Point Session Dosage Given: 3 Gy
Session Number: 1

## 2022-04-27 ENCOUNTER — Other Ambulatory Visit: Payer: Self-pay

## 2022-04-27 ENCOUNTER — Ambulatory Visit
Admission: RE | Admit: 2022-04-27 | Discharge: 2022-04-27 | Disposition: A | Discharge status: Home / Self Care | Payer: Medicare Other | Source: Ambulatory Visit | Attending: Radiation Oncology | Admitting: Radiation Oncology

## 2022-04-27 DIAGNOSIS — Z510 Encounter for antineoplastic radiation therapy: Secondary | ICD-10-CM | POA: Diagnosis not present

## 2022-04-27 LAB — RAD ONC ARIA SESSION SUMMARY
Course Elapsed Days: 1
Plan Fractions Treated to Date: 2
Plan Prescribed Dose Per Fraction: 3 Gy
Plan Total Fractions Prescribed: 10
Plan Total Prescribed Dose: 30 Gy
Reference Point Dosage Given to Date: 6 Gy
Reference Point Session Dosage Given: 3 Gy
Session Number: 2

## 2022-04-28 ENCOUNTER — Ambulatory Visit
Admission: RE | Admit: 2022-04-28 | Discharge: 2022-04-28 | Disposition: A | Discharge status: Home / Self Care | Payer: Medicare Other | Source: Ambulatory Visit | Attending: Radiation Oncology | Admitting: Radiation Oncology

## 2022-04-28 ENCOUNTER — Other Ambulatory Visit: Payer: Self-pay

## 2022-04-28 ENCOUNTER — Telehealth: Payer: Self-pay

## 2022-04-28 ENCOUNTER — Other Ambulatory Visit: Payer: Self-pay | Admitting: Urology

## 2022-04-28 ENCOUNTER — Other Ambulatory Visit: Payer: Self-pay | Admitting: *Deleted

## 2022-04-28 DIAGNOSIS — Z510 Encounter for antineoplastic radiation therapy: Secondary | ICD-10-CM | POA: Diagnosis not present

## 2022-04-28 DIAGNOSIS — M25512 Pain in left shoulder: Secondary | ICD-10-CM

## 2022-04-28 LAB — RAD ONC ARIA SESSION SUMMARY
Course Elapsed Days: 2
Plan Fractions Treated to Date: 3
Plan Prescribed Dose Per Fraction: 3 Gy
Plan Total Fractions Prescribed: 10
Plan Total Prescribed Dose: 30 Gy
Reference Point Dosage Given to Date: 9 Gy
Reference Point Session Dosage Given: 3 Gy
Session Number: 3

## 2022-04-28 MED ORDER — TRAMADOL HCL 50 MG PO TABS: 50.0000 mg | ORAL_TABLET | Freq: Four times a day (QID) | ORAL | 1 refills | Status: DC | PRN

## 2022-04-28 MED ORDER — LIDOCAINE 5 % EX PTCH: 1.0000 | MEDICATED_PATCH | CUTANEOUS | 0 refills | Status: DC

## 2022-04-28 NOTE — Telephone Encounter (Signed)
RN called to follow up with patient about alleviating pain to left shoulder.  Mr. Saleeby had suggested maybe a cortisone injection to the joint of left shoulder.  He hasn't heard from medical oncology nurse and was interested in what radiation team could do to help.  Per Freeman Caldron, PA-C she will prescribe Ultram for patient to take prior to radiation treatment.  Cortisone injection is something that radiation team doesn't provide but he could go to orthopedist or PCP if they offer services.  Mr. Jensen agreed to try pain medication, but wanted to also hear back from medical oncology team if they have any suggestions.  Will reach back out to Dr. Hazeline Junker nurse for follow on their end.

## 2022-04-29 ENCOUNTER — Ambulatory Visit
Admission: RE | Admit: 2022-04-29 | Discharge: 2022-04-29 | Disposition: A | Discharge status: Home / Self Care | Payer: Medicare Other | Source: Ambulatory Visit | Attending: Radiation Oncology | Admitting: Radiation Oncology

## 2022-04-29 ENCOUNTER — Other Ambulatory Visit: Payer: Self-pay

## 2022-04-29 DIAGNOSIS — Z510 Encounter for antineoplastic radiation therapy: Secondary | ICD-10-CM | POA: Diagnosis not present

## 2022-04-29 LAB — RAD ONC ARIA SESSION SUMMARY
Course Elapsed Days: 3
Plan Fractions Treated to Date: 4
Plan Prescribed Dose Per Fraction: 3 Gy
Plan Total Fractions Prescribed: 10
Plan Total Prescribed Dose: 30 Gy
Reference Point Dosage Given to Date: 12 Gy
Reference Point Session Dosage Given: 3 Gy
Session Number: 4

## 2022-04-30 ENCOUNTER — Ambulatory Visit: Admission: RE | Admit: 2022-04-30 | Payer: Medicare Other | Source: Ambulatory Visit

## 2022-04-30 ENCOUNTER — Ambulatory Visit
Admission: RE | Admit: 2022-04-30 | Discharge: 2022-04-30 | Disposition: A | Discharge status: Home / Self Care | Payer: Medicare Other | Source: Ambulatory Visit | Attending: Radiation Oncology | Admitting: Radiation Oncology

## 2022-04-30 ENCOUNTER — Other Ambulatory Visit: Payer: Self-pay

## 2022-04-30 DIAGNOSIS — Z510 Encounter for antineoplastic radiation therapy: Secondary | ICD-10-CM | POA: Diagnosis not present

## 2022-04-30 LAB — RAD ONC ARIA SESSION SUMMARY
Course Elapsed Days: 4
Plan Fractions Treated to Date: 5
Plan Prescribed Dose Per Fraction: 3 Gy
Plan Total Fractions Prescribed: 10
Plan Total Prescribed Dose: 30 Gy
Reference Point Dosage Given to Date: 15 Gy
Reference Point Session Dosage Given: 3 Gy
Session Number: 5

## 2022-05-03 ENCOUNTER — Ambulatory Visit
Admission: RE | Admit: 2022-05-03 | Discharge: 2022-05-03 | Disposition: A | Discharge status: Home / Self Care | Payer: Medicare Other | Source: Ambulatory Visit | Attending: Radiation Oncology | Admitting: Radiation Oncology

## 2022-05-03 ENCOUNTER — Other Ambulatory Visit: Payer: Self-pay

## 2022-05-03 DIAGNOSIS — Z510 Encounter for antineoplastic radiation therapy: Secondary | ICD-10-CM | POA: Diagnosis not present

## 2022-05-03 LAB — RAD ONC ARIA SESSION SUMMARY
Course Elapsed Days: 7
Plan Fractions Treated to Date: 6
Plan Prescribed Dose Per Fraction: 3 Gy
Plan Total Fractions Prescribed: 10
Plan Total Prescribed Dose: 30 Gy
Reference Point Dosage Given to Date: 18 Gy
Reference Point Session Dosage Given: 3 Gy
Session Number: 6

## 2022-05-04 ENCOUNTER — Other Ambulatory Visit: Payer: Self-pay

## 2022-05-04 ENCOUNTER — Ambulatory Visit
Admission: RE | Admit: 2022-05-04 | Discharge: 2022-05-04 | Disposition: A | Discharge status: Home / Self Care | Payer: Medicare Other | Source: Ambulatory Visit | Attending: Radiation Oncology | Admitting: Radiation Oncology

## 2022-05-04 DIAGNOSIS — Z510 Encounter for antineoplastic radiation therapy: Secondary | ICD-10-CM | POA: Diagnosis not present

## 2022-05-04 LAB — RAD ONC ARIA SESSION SUMMARY
Course Elapsed Days: 8
Plan Fractions Treated to Date: 7
Plan Prescribed Dose Per Fraction: 3 Gy
Plan Total Fractions Prescribed: 10
Plan Total Prescribed Dose: 30 Gy
Reference Point Dosage Given to Date: 21 Gy
Reference Point Session Dosage Given: 3 Gy
Session Number: 7

## 2022-05-05 ENCOUNTER — Ambulatory Visit
Admission: RE | Admit: 2022-05-05 | Discharge: 2022-05-05 | Disposition: A | Discharge status: Home / Self Care | Payer: Medicare Other | Source: Ambulatory Visit | Attending: Radiation Oncology | Admitting: Radiation Oncology

## 2022-05-05 ENCOUNTER — Other Ambulatory Visit: Payer: Self-pay

## 2022-05-05 DIAGNOSIS — C61 Malignant neoplasm of prostate: Secondary | ICD-10-CM | POA: Insufficient documentation

## 2022-05-05 DIAGNOSIS — Z510 Encounter for antineoplastic radiation therapy: Secondary | ICD-10-CM | POA: Diagnosis present

## 2022-05-05 DIAGNOSIS — C7951 Secondary malignant neoplasm of bone: Secondary | ICD-10-CM | POA: Insufficient documentation

## 2022-05-05 LAB — RAD ONC ARIA SESSION SUMMARY
Course Elapsed Days: 9
Plan Fractions Treated to Date: 8
Plan Prescribed Dose Per Fraction: 3 Gy
Plan Total Fractions Prescribed: 10
Plan Total Prescribed Dose: 30 Gy
Reference Point Dosage Given to Date: 24 Gy
Reference Point Session Dosage Given: 3 Gy
Session Number: 8

## 2022-05-06 ENCOUNTER — Ambulatory Visit
Admission: RE | Admit: 2022-05-06 | Discharge: 2022-05-06 | Disposition: A | Discharge status: Home / Self Care | Payer: Medicare Other | Source: Ambulatory Visit | Attending: Radiation Oncology | Admitting: Radiation Oncology

## 2022-05-06 ENCOUNTER — Other Ambulatory Visit: Payer: Self-pay

## 2022-05-06 DIAGNOSIS — Z510 Encounter for antineoplastic radiation therapy: Secondary | ICD-10-CM | POA: Diagnosis not present

## 2022-05-06 LAB — RAD ONC ARIA SESSION SUMMARY
Course Elapsed Days: 10
Plan Fractions Treated to Date: 9
Plan Prescribed Dose Per Fraction: 3 Gy
Plan Total Fractions Prescribed: 10
Plan Total Prescribed Dose: 30 Gy
Reference Point Dosage Given to Date: 27 Gy
Reference Point Session Dosage Given: 3 Gy
Session Number: 9

## 2022-05-07 ENCOUNTER — Ambulatory Visit
Admission: RE | Admit: 2022-05-07 | Discharge: 2022-05-07 | Disposition: A | Discharge status: Home / Self Care | Payer: Medicare Other | Source: Ambulatory Visit | Attending: Radiation Oncology | Admitting: Radiation Oncology

## 2022-05-07 ENCOUNTER — Encounter: Payer: Self-pay | Admitting: Urology

## 2022-05-07 ENCOUNTER — Other Ambulatory Visit: Payer: Self-pay

## 2022-05-07 DIAGNOSIS — Z510 Encounter for antineoplastic radiation therapy: Secondary | ICD-10-CM | POA: Diagnosis not present

## 2022-05-07 LAB — RAD ONC ARIA SESSION SUMMARY
Course Elapsed Days: 11
Plan Fractions Treated to Date: 10
Plan Prescribed Dose Per Fraction: 3 Gy
Plan Total Fractions Prescribed: 10
Plan Total Prescribed Dose: 30 Gy
Reference Point Dosage Given to Date: 30 Gy
Reference Point Session Dosage Given: 3 Gy
Session Number: 10

## 2022-05-18 ENCOUNTER — Other Ambulatory Visit: Payer: Self-pay

## 2022-05-18 ENCOUNTER — Inpatient Hospital Stay: Payer: Medicare Other | Attending: Oncology

## 2022-05-18 DIAGNOSIS — C61 Malignant neoplasm of prostate: Secondary | ICD-10-CM | POA: Insufficient documentation

## 2022-05-18 LAB — CMP (CANCER CENTER ONLY)
ALT: 9 U/L (ref 0–44)
AST: 13 U/L — ABNORMAL LOW (ref 15–41)
Albumin: 3.7 g/dL (ref 3.5–5.0)
Alkaline Phosphatase: 119 U/L (ref 38–126)
Anion gap: 5 (ref 5–15)
BUN: 18 mg/dL (ref 8–23)
CO2: 29 mmol/L (ref 22–32)
Calcium: 8.7 mg/dL — ABNORMAL LOW (ref 8.9–10.3)
Chloride: 104 mmol/L (ref 98–111)
Creatinine: 0.88 mg/dL (ref 0.61–1.24)
GFR, Estimated: >60 mL/min (ref >60)
Glucose, Bld: 93 mg/dL (ref 70–99)
Potassium: 4.6 mmol/L (ref 3.5–5.1)
Sodium: 138 mmol/L (ref 135–145)
Total Bilirubin: 0.4 mg/dL (ref 0.3–1.2)
Total Protein: 6.3 g/dL — ABNORMAL LOW (ref 6.5–8.1)

## 2022-05-18 LAB — CBC WITH DIFFERENTIAL (CANCER CENTER ONLY)
Abs Immature Granulocytes: 0.01 10*3/uL (ref 0.00–0.07)
Basophils Absolute: 0 10*3/uL (ref 0.0–0.1)
Basophils Relative: 1 %
Eosinophils Absolute: 0.9 10*3/uL — ABNORMAL HIGH (ref 0.0–0.5)
Eosinophils Relative: 23 %
HCT: 38.9 % — ABNORMAL LOW (ref 39.0–52.0)
Hemoglobin: 13.3 g/dL (ref 13.0–17.0)
Immature Granulocytes: 0 %
Lymphocytes Relative: 14 %
Lymphs Abs: 0.5 10*3/uL — ABNORMAL LOW (ref 0.7–4.0)
MCH: 31.6 pg (ref 26.0–34.0)
MCHC: 34.2 g/dL (ref 30.0–36.0)
MCV: 92.4 fL (ref 80.0–100.0)
Monocytes Absolute: 0.5 10*3/uL (ref 0.1–1.0)
Monocytes Relative: 12 %
Neutro Abs: 2 10*3/uL (ref 1.7–7.7)
Neutrophils Relative %: 50 %
Platelet Count: 213 10*3/uL (ref 150–400)
RBC: 4.21 MIL/uL — ABNORMAL LOW (ref 4.22–5.81)
RDW: 13.2 % (ref 11.5–15.5)
WBC Count: 4 10*3/uL (ref 4.0–10.5)
nRBC: 0 % (ref 0.0–0.2)

## 2022-05-20 LAB — PROSTATE-SPECIFIC AG, SERUM (LABCORP): Prostate Specific Ag, Serum: 14 ng/mL — ABNORMAL HIGH (ref 0.0–4.0)

## 2022-05-20 LAB — TESTOSTERONE: Testosterone: 13 ng/dL — ABNORMAL LOW (ref 264–916)

## 2022-05-22 ENCOUNTER — Telehealth: Payer: Self-pay | Admitting: Oncology

## 2022-05-22 NOTE — Telephone Encounter (Signed)
Called patient regarding upcoming December appointment, patient is notified. 

## 2022-05-25 ENCOUNTER — Encounter: Payer: Self-pay | Admitting: Oncology

## 2022-05-31 LAB — GUARDANT 360

## 2022-06-08 ENCOUNTER — Other Ambulatory Visit: Payer: Self-pay

## 2022-06-08 ENCOUNTER — Inpatient Hospital Stay: Payer: Medicare Other | Attending: Oncology | Admitting: Oncology

## 2022-06-08 VITALS — BP 93/73 | HR 77 | Temp 97.7°F | Resp 17 | Ht 71.0 in | Wt 188.9 lb

## 2022-06-08 DIAGNOSIS — C7951 Secondary malignant neoplasm of bone: Secondary | ICD-10-CM | POA: Diagnosis not present

## 2022-06-08 DIAGNOSIS — F419 Anxiety disorder, unspecified: Secondary | ICD-10-CM | POA: Insufficient documentation

## 2022-06-08 DIAGNOSIS — C61 Malignant neoplasm of prostate: Secondary | ICD-10-CM | POA: Diagnosis present

## 2022-06-08 NOTE — Progress Notes (Signed)
Hematology and Oncology Follow Up Visit  Nathan Berg [id number] Jun 24, 1956 65 y.o. 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  He presented in 2015 with Gleason score 4+3 = 7 PSA of 9.1.   Prior Therapy: He is s/p robotic assisted laparoscopic radical prostatectomy and bilateral lymph node dissection in March 2015.  The final pathological staging was T2CN0.  His Gleason score was 4+3 = 7 in both lobes without any evidence of angiolymphatic invasion or extraprostatic extension.      His PSA was up to 0.09 in October 2015.  He subsequently received salvage radiation under the care of Dr. Tammi Klippel after receiving 38.4 Pearline Cables in 38 fractions between May 06, 2014 and June 26, 2014.  He has received 6 months of androgen deprivation utilizing Lupron.   His a PSA was 0.086 in 2017 and the testosterone level was low at that time despite stopping androgen deprivation.  He subsequently started on testosterone replacement therapy with improvement in his testosterone level.  In 2018 his PSA remained low at 0.2 and subsequently 0.68.  In 2019 his PSA started to rise up to 0.94 in April 2019.  His PSA in October 2019 was 1.07, in December 2019 his PSA was 0.83, in July 2020 his PSA was 1.27, in October 2020 his PSA was 2.35 with a testosterone level of 245.  He has been off testosterone therapy.   PSA in March 2021 was 3.92 and in June 2021 was 8.18.  He was found to have oligometastatic disease.  He is status post radiation therapy between August 11 and March 02, 2020.  Right iliac crest and the right internal iliac lymph node.  He developed metastasis based on pet imaging on September 16, 2020.  Zytiga 1000 mg with prednisone given in July 2022 while he is obtaining coverage for Xtandi.   Palliative radiation therapy and total of 30 Gray in 10 fractions  to the first rib and T3 spine.  Current therapy:    Eligard 30 mg started on September 18, 2020.  He receives Eligard if his testosterone increases above 50.  Xtandi 160 mg daily started in August 2022.  Interim History: [name] returns today for repeat evaluation.  Since last visit, he reports no major changes in his health.   He reports no major changes in his health.  He denies any recent hospitalizations or illnesses.  He denies any worsening bone pain or pathological fractures.  He did not notice any improvement in his bone pain after radiation treatment.  He continues to report some occasional fatigue and tiredness but overall have tolerated Xtandi.     Medications: Updated on review. Current Outpatient Medications  Medication Sig Dispense Refill   ALPRAZolam (XANAX) 1 MG tablet TAKE 1/2 TO 1 TABLET BY MOUTH TWICE DAILY AS NEEDED FOR ANXIETY. MUST HAVE OFFICE OR TELEMEDICINE VISIT FOR REFILLS 30 tablet 0   buPROPion (WELLBUTRIN SR) 200 MG 12 hr tablet Take 200 mg by mouth 2 (two) times daily.     escitalopram (LEXAPRO) 20 MG tablet Take by mouth.     gabapentin (NEURONTIN) 300 MG capsule Take 2 in the morning, 1 in the afternoon 1 at nighttime 120 capsule 3   lidocaine (LIDODERM) 5 % Place 1 patch onto the skin daily. Remove & Discard patch within 12 hours or as directed by MD 30 patch 0  traMADol (ULTRAM) 50 MG tablet Take 1 tablet (50 mg total) by mouth every 6 (six) hours as needed (take one tablet 30 minutes prior to radiation treatments and every 6 hours as needed thereafter). 30 tablet 1   traZODone (DESYREL) 100 MG tablet TAKE DAILY AS DIRECTED FOR INSOMNIA (Patient not taking: Reported on 04/01/2020)     traZODone (DESYREL) 25 mg TABS tablet Take 25 mg by mouth at bedtime.     XTANDI 40 MG tablet Take 4 tablets (167m) by mouth once daily as directed by physician. 120 tablet 1   No current facility-administered medications for this visit.     Allergies:  Allergies   Allergen Reactions   Codeine Nausea And Vomiting   Penicillin G     Other reaction(s): stomach upset      Physical Exam:          Blood pressure 93/73, pulse 77, temperature 97.7 F (36.5 C), temperature source Temporal, resp. rate 17, height _0  (1.803 m), weight 188 lb 14.4 oz (85.7 kg), SpO2 96 %.     ECOG: 1    General appearance: Alert, awake without any distress. Head: Atraumatic without abnormalities Oropharynx: Without any thrush or ulcers. Eyes: No scleral icterus. Lymph nodes: No lymphadenopathy noted in the cervical, supraclavicular, or axillary nodes Heart:regular rate and rhythm, without any murmurs or gallops.   Lung: Clear to auscultation without any rhonchi, wheezes or dullness to percussion. Abdomin: Soft, nontender without any shifting dullness or ascites. Musculoskeletal: No clubbing or cyanosis. Neurological: No motor or sensory deficits. Skin: No rashes or lesions.             Lab Results: Lab Results  Component Value Date   WBC 4.0 05/18/2022   HGB 13.3 05/18/2022   HCT 38.9 (L) 05/18/2022   MCV 92.4 05/18/2022   PLT 213 05/18/2022     Chemistry      Component Value Date/Time   NA 138 05/18/2022 1115   K 4.6 05/18/2022 1115   CL 104 05/18/2022 1115   CO2 29 05/18/2022 1115   BUN 18 05/18/2022 1115   CREATININE 0.88 05/18/2022 1115      Component Value Date/Time   CALCIUM 8.7 (L) 05/18/2022 1115   ALKPHOS 119 05/18/2022 1115   AST 13 (L) 05/18/2022 1115   ALT 9 05/18/2022 1115   BILITOT 0.4 05/18/2022 1115       Latest Reference Range & Units 12/16/21 11:11 01/29/22 12:55 03/22/22 14:00 05/18/22 11:15  Prostate Specific Ag, Serum 0.0 - 4.0 ng/mL 10.8 (H) 12.7 (H) 13.5 (H) 14.0 (H)  (H): Data is abnormally high  Impression and Plan:  66year old man with:   1.   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     His disease status was  updated at this time and treatment choices were reviewed.  His PSA continues to rise but rather slowly without any rapid progression based on pet imaging in September 2023.  Risks and benefits of continuing this treatment were discussed at this time.  Alternative treatment option will be a PARP inhibitor given his ATM mutation or Taxotere chemotherapy.  After discussion today, he would like to continue with Xtandi or other androgen receptor pathway inhibitor pending his insurance coverage.  If we are unable to find coverage for [place]  Complication associated with [place] include nausea, fatigue, myelosuppression among others were reviewed.  This will be  deferred if we cannot find coverage for him.  2.  Androgen deprivation therapy: His testosterone level continues to be at a castrate level and will be repeated if his PSA is above 20.   3.  Bone directed therapy: Delton See has been deferred at this time.  Calcium and vitamin D supplements will be continued.   4.  Bone pain: Related to prostate cancer without any evidence of worsening bone disease at this time.  5.  Anxiety: His mood stable without any worsening symptoms at this time.   6.   Follow-up: In the next 6 to 8 weeks for repeat follow-up.     30  minutes were dedicated to this discussion.  The time was spent on updating disease status, treatment choices and outlining future plan of care review.  Zola Button, MD 12/5/20233:24 PM

## 2022-06-17 ENCOUNTER — Telehealth: Payer: Self-pay | Admitting: Pharmacy Technician

## 2022-06-17 NOTE — Telephone Encounter (Signed)
Oral Oncology Patient Advocate Encounter   Received notification that patient is due for re-enrollment for assistance for Xtandi through [employer].   Re-enrollment requires proof of income. Required documents have been uploaded to the online portal.    Re-enrollment key: [place] phone number [phone].   I will continue to follow until final determination.   Nathan Berg, CPhT-Adv Oncology Pharmacy Patient [place] Direct Number: [phone]  Fax: [phone]

## 2022-06-18 ENCOUNTER — Other Ambulatory Visit: Payer: Self-pay | Admitting: Oncology

## 2022-06-18 ENCOUNTER — Other Ambulatory Visit: Payer: Self-pay

## 2022-06-18 ENCOUNTER — Encounter: Payer: Self-pay | Admitting: Oncology

## 2022-06-18 ENCOUNTER — Other Ambulatory Visit (HOSPITAL_COMMUNITY): Payer: Self-pay

## 2022-06-18 DIAGNOSIS — C61 Malignant neoplasm of prostate: Secondary | ICD-10-CM

## 2022-06-18 MED ORDER — ALPRAZOLAM 1 MG PO TABS
ORAL_TABLET | ORAL | 0 refills | Status: DC
  Filled 2022-06-18: qty 30, 15d supply, fill #0

## 2022-06-19 ENCOUNTER — Other Ambulatory Visit (HOSPITAL_COMMUNITY): Payer: Self-pay

## 2022-06-19 ENCOUNTER — Encounter: Payer: Self-pay | Admitting: Oncology

## 2022-06-21 ENCOUNTER — Other Ambulatory Visit (HOSPITAL_COMMUNITY): Payer: Self-pay

## 2022-06-22 ENCOUNTER — Other Ambulatory Visit: Payer: Self-pay | Admitting: Oncology

## 2022-07-13 ENCOUNTER — Other Ambulatory Visit: Payer: Self-pay | Admitting: *Deleted

## 2022-07-13 DIAGNOSIS — C61 Malignant neoplasm of prostate: Secondary | ICD-10-CM

## 2022-07-13 MED ORDER — ENZALUTAMIDE 40 MG PO TABS: ORAL_TABLET | ORAL | 0 refills | Status: DC

## 2022-07-13 NOTE — Telephone Encounter (Signed)
Oral Oncology Patient Advocate Encounter  Received update via fax from [employer] regarding patient assistance application for [place].  Status is pending e-rx. Rx must be sent to [facility] pharmacy. I have requested this process be initiated   [employer] [phone]  I will continue to check status until final determination.  Lady Deutscher, CPhT-Adv Oncology Pharmacy Patient [place] Direct Number: [phone]  Fax: [phone]

## 2022-07-16 NOTE — Telephone Encounter (Signed)
Oral Oncology Patient Advocate Encounter   Received notification re-enrollment for assistance for Xtandi through [employer] has been approved. Patient may continue to receive their medication at $0 from this program.    [place] phone number [phone]   Effective dates: 07/15/22 through 07/05/23  I have spoken to the patient.  Lady Deutscher, CPhT-Adv Oncology Pharmacy Patient [place] Direct Number: [phone]  Fax: [phone]

## 2022-08-09 ENCOUNTER — Other Ambulatory Visit: Payer: Self-pay

## 2022-08-09 DIAGNOSIS — C61 Malignant neoplasm of prostate: Secondary | ICD-10-CM

## 2022-08-10 ENCOUNTER — Other Ambulatory Visit: Payer: Self-pay

## 2022-08-10 ENCOUNTER — Other Ambulatory Visit: Payer: Self-pay | Admitting: Hematology

## 2022-08-10 DIAGNOSIS — C61 Malignant neoplasm of prostate: Secondary | ICD-10-CM

## 2022-08-10 DIAGNOSIS — M25512 Pain in left shoulder: Secondary | ICD-10-CM

## 2022-08-10 MED ORDER — ENZALUTAMIDE 40 MG PO TABS: ORAL_TABLET | ORAL | 0 refills | Status: DC

## 2022-08-10 MED ORDER — LIDOCAINE 5 % EX PTCH: 1.0000 | MEDICATED_PATCH | CUTANEOUS | 0 refills | Status: DC

## 2022-08-11 ENCOUNTER — Other Ambulatory Visit: Payer: Self-pay

## 2022-08-11 ENCOUNTER — Inpatient Hospital Stay: Payer: Medicare Other | Attending: Oncology

## 2022-08-11 DIAGNOSIS — C7951 Secondary malignant neoplasm of bone: Secondary | ICD-10-CM | POA: Insufficient documentation

## 2022-08-11 DIAGNOSIS — C61 Malignant neoplasm of prostate: Secondary | ICD-10-CM | POA: Diagnosis present

## 2022-08-11 DIAGNOSIS — F32A Depression, unspecified: Secondary | ICD-10-CM | POA: Diagnosis not present

## 2022-08-11 DIAGNOSIS — F419 Anxiety disorder, unspecified: Secondary | ICD-10-CM | POA: Insufficient documentation

## 2022-08-11 DIAGNOSIS — R5383 Other fatigue: Secondary | ICD-10-CM | POA: Diagnosis not present

## 2022-08-11 LAB — CMP (CANCER CENTER ONLY)
ALT: 11 U/L (ref 0–44)
AST: 16 U/L (ref 15–41)
Albumin: 3.7 g/dL (ref 3.5–5.0)
Alkaline Phosphatase: 103 U/L (ref 38–126)
Anion gap: 4 — ABNORMAL LOW (ref 5–15)
BUN: 15 mg/dL (ref 8–23)
CO2: 32 mmol/L (ref 22–32)
Calcium: 9.1 mg/dL (ref 8.9–10.3)
Chloride: 104 mmol/L (ref 98–111)
Creatinine: 0.89 mg/dL (ref 0.61–1.24)
GFR, Estimated: >60 mL/min (ref >60)
Glucose, Bld: 97 mg/dL (ref 70–99)
Potassium: 4.5 mmol/L (ref 3.5–5.1)
Sodium: 140 mmol/L (ref 135–145)
Total Bilirubin: 0.4 mg/dL (ref 0.3–1.2)
Total Protein: 6.3 g/dL — ABNORMAL LOW (ref 6.5–8.1)

## 2022-08-11 LAB — CBC WITH DIFFERENTIAL (CANCER CENTER ONLY)
Abs Immature Granulocytes: 0.01 10*3/uL (ref 0.00–0.07)
Basophils Absolute: 0 10*3/uL (ref 0.0–0.1)
Basophils Relative: 1 %
Eosinophils Absolute: 0.1 10*3/uL (ref 0.0–0.5)
Eosinophils Relative: 2 %
HCT: 41.1 % (ref 39.0–52.0)
Hemoglobin: 14.1 g/dL (ref 13.0–17.0)
Immature Granulocytes: 0 %
Lymphocytes Relative: 18 %
Lymphs Abs: 0.7 10*3/uL (ref 0.7–4.0)
MCH: 31.3 pg (ref 26.0–34.0)
MCHC: 34.3 g/dL (ref 30.0–36.0)
MCV: 91.1 fL (ref 80.0–100.0)
Monocytes Absolute: 0.3 10*3/uL (ref 0.1–1.0)
Monocytes Relative: 8 %
Neutro Abs: 2.9 10*3/uL (ref 1.7–7.7)
Neutrophils Relative %: 71 %
Platelet Count: 276 10*3/uL (ref 150–400)
RBC: 4.51 MIL/uL (ref 4.22–5.81)
RDW: 12.3 % (ref 11.5–15.5)
WBC Count: 4.1 10*3/uL (ref 4.0–10.5)
nRBC: 0 % (ref 0.0–0.2)

## 2022-08-12 LAB — TESTOSTERONE: Testosterone: 9 ng/dL — ABNORMAL LOW (ref 264–916)

## 2022-08-12 LAB — PROSTATE-SPECIFIC AG, SERUM (LABCORP): Prostate Specific Ag, Serum: 13.9 ng/mL — ABNORMAL HIGH (ref 0.0–4.0)

## 2022-08-18 ENCOUNTER — Encounter: Payer: Self-pay | Admitting: Hematology

## 2022-08-18 ENCOUNTER — Telehealth: Payer: Self-pay

## 2022-08-18 ENCOUNTER — Other Ambulatory Visit: Payer: Self-pay

## 2022-08-18 ENCOUNTER — Inpatient Hospital Stay (HOSPITAL_BASED_OUTPATIENT_CLINIC_OR_DEPARTMENT_OTHER): Payer: Medicare Other | Admitting: Hematology

## 2022-08-18 VITALS — BP 124/74 | HR 66 | Temp 98.0°F | Resp 14 | Wt 185.6 lb

## 2022-08-18 DIAGNOSIS — C61 Malignant neoplasm of prostate: Secondary | ICD-10-CM

## 2022-08-18 MED ORDER — ALPRAZOLAM 1 MG PO TABS: ORAL_TABLET | ORAL | 0 refills | Status: DC

## 2022-08-18 MED ORDER — OLAPARIB 150 MG PO TABS
300.0000 mg | ORAL_TABLET | Freq: Two times a day (BID) | ORAL | 0 refills | Status: DC
  Filled 2022-08-18: qty 120, 30d supply, fill #0

## 2022-08-18 NOTE — Assessment & Plan Note (Signed)
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   -s/p robotic assisted laparoscopic radical prostatectomy and bilateral lymph node dissection in March 2015, pT2N0 -He subsequently received salvage radiation under the care of Dr. Tammi Klippel after receiving 38.4 Pearline Cables in 38 fractions between May 06, 2014 and June 26, 2014.  He has received 6 months of androgen deprivation utilizing Lupron.  -He developed metastasis based on pet imaging on September 16, 2020.  -Zytiga 1000 mg with prednisone given in July 2022 to 09/2020 -he has been on  Xtandi 160 mg daily started in August 2022, and [place]. His PSA level has been slowing trending up  -he is a candidate for PARP inhibitor if he progress further

## 2022-08-18 NOTE — Progress Notes (Signed)
[place]   [phone] Fax:([phone]) [phone]   Clinic Follow up Note   Patient Care Team: [name], MD as PCP - General (Family Medicine) Truitt Merle, MD as Attending Physician (Hematology and Oncology)  Date of Service:  08/18/2022  CHIEF COMPLAINT: f/u of Prostate Cancer   CURRENT THERAPY:    Eligard 30 mg started on September 18, 2020.  He receives Eligard if his testosterone increases above 50.   Xtandi 160 mg daily started in August 2022.    ASSESSMENT:  Nathan Berg is a 67 y.o. 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   -s/p robotic assisted laparoscopic radical prostatectomy and bilateral lymph node dissection in March 2015, pT2N0 -He subsequently received salvage radiation under the care of Dr. Tammi Klippel after receiving 38.4 Pearline Cables in 38 fractions between May 06, 2014 and June 26, 2014.  He has received 6 months of androgen deprivation utilizing Lupron.  -He developed metastasis based on pet imaging on September 16, 2020.  -Zytiga 1000 mg with prednisone given in July 2022 to 09/2020 -he has been on  Xtandi 160 mg daily started in August 2022, and Eligard as needed. His PSA level has been slowing trending up  -he finished a course of RT on 05/07/2022. His repeated PSA was 13.9 on 08/11/2022,which is similar to 3 months ago.  -due to his worsening body pain, and fatigue, I recommend her to chage to PARP inhibitor Lynparza.  I discussed the potential benefit and side effect, which includes but not limited to, nausea, fatigue, myelosuppression with cytopenias, abnormal liver function, etc. He agrees to proceed.  -he will finish the rest 2 weeks of Xtandi he has, then start Lynparza, I will call in today   Depression and anxiety  -he is on multiple medications, including Wellbutrin, Xanax as needed, Lexapro was recently  changed to Seroquel, trazodone, managed by his PCP -he has a lot of stress especially from his son which is a drug adit  -I recommend referral to psychiatrist or at least a psychology, he declined.  I also did offer to social work referral [employer] counseling, he again declined. -I refilled his Xanax, I encouraged him to follow-up with his primary care physician for other medication refills -due to his multiple symptoms, I will refer him to see palliative care NP Nikki in our clinic, he agreed    PLAN: -lab reviewed -Discuss PSA and previous CT scan -Discuss Guardant 360 and target therapy -Discuss PARP inhibitor Lynparza, I will call in for him, he will start in 2 weeks when he finish Xtandi -referral Pallative Care -I refill Xanax -f/u in 4 weeks, w/lab    SUMMARY OF ONCOLOGIC HISTORY: Oncology History Overview Note   Cancer Staging  Malignant neoplasm of prostate ([place]) Staging form: Prostate, AJCC 7th Edition - Clinical: Stage IV (TX, NX, M1, PSA: Less than 10, Gleason 8-10) - Signed by Wyatt Portela, MD on 04/30/2021     Malignant neoplasm of prostate ([place])  09/19/2013 Initial Diagnosis   Malignant neoplasm of prostate ([place])   04/30/2021 Cancer Staging   Staging form: Prostate, AJCC 7th Edition - Clinical: Stage IV (TX, NX, M1, PSA: Less than 10, Gleason 8-10) - Signed by Wyatt Portela, MD on 04/30/2021   03/11/2022 Imaging    IMPRESSION: 1. Multifocal intense radiotracer avid skeletal metastasis  again noted involving the axillary skeleton. No change in pattern or number of lesions. No new lesions identified. 2. The radiotracer activity of the lesions; however, is uniformly increased from comparison exam. 3. The sclerotic pattern of the lesions on comparison CT is a similar. One lesion at T3 as new lytic component. 4. No visceral metastasis.  No new nodal disease.        INTERVAL HISTORY:  Nathan Berg is here for a follow up of Prostate Cancer He was last seen  by Dr.Shadad on 06/08/2022 He presents to the clinic alone.Pt state he is not feeling good.He stated he has a lot of joint pain,and no energy. 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 Pt reports when he ambulate he get fatigue pretty quick. Pt stated he has been going through this since he started the [place]. Pt state he has hot flashes. Pt state his lower back hurts when he walks. Pt taking more Xanax than usual. Pt stated he went to Physical Therapy.  All other systems were reviewed with the patient and are negative.  MEDICAL HISTORY:  Past Medical History:  Diagnosis Date   Arthritis    Cancer ([place])    dx. 2 months ago- Prostate cancer   GERD (gastroesophageal reflux disease)    H/O seasonal allergies    History of kidney stones    x1   Prostate cancer ([place])     SURGICAL HISTORY: Past Surgical History:  Procedure Laterality Date   CHEST TUBE INSERTION  07/16/2019   trauma    COLONOSCOPY     HERNIA REPAIR Bilateral    20 yrs ago   LYMPHADENECTOMY Bilateral 09/19/2013   Procedure: LYMPHADENECTOMY  PELVIC LYMPH NODE DISSECTION;  Surgeon: Bernestine Amass, MD;  Location: [facility];  Service: Urology;  Laterality: Bilateral;   PROSTATE BIOPSY     ROBOT ASSISTED LAPAROSCOPIC RADICAL PROSTATECTOMY N/A 09/19/2013   Procedure: ROBOTIC ASSISTED LAPAROSCOPIC RADICAL PROSTATECTOMY;  Surgeon: Bernestine Amass, MD;  Location: [facility];  Service: Urology;  Laterality: N/A;   TONSILLECTOMY     child   VARICOCELE EXCISION      I have reviewed the social history and family history with the patient and they are unchanged from previous note.  ALLERGIES:  is allergic to codeine and penicillin g.  MEDICATIONS:  Current Outpatient Medications  Medication Sig Dispense Refill   ALPRAZolam (XANAX) 1 MG tablet TAKE 1/2 TO 1 TABLET BY MOUTH TWICE DAILY AS NEEDED FOR ANXIETY. MUST HAVE OFFICE OR TELEMEDICINE VISIT FOR REFILLS 45 tablet 0   buPROPion (WELLBUTRIN SR)  200 MG 12 hr tablet Take 200 mg by mouth 2 (two) times daily.     enzalutamide (XTANDI) 40 MG tablet Take 4 tablets ('160mg'$ ) by mouth once daily as directed by physician. 120 tablet 0   escitalopram (LEXAPRO) 20 MG tablet Take by mouth.     gabapentin (NEURONTIN) 300 MG capsule Take 2 in the morning, 1 in the afternoon 1 at nighttime 120 capsule 3   lidocaine (LIDODERM) 5 % Place 1 patch onto the skin daily. Remove & Discard patch within 12 hours or as directed by MD 30 patch 0   QUEtiapine (SEROQUEL) 25 MG tablet Take 50 mg by mouth 2 (two) times daily.     traMADol (ULTRAM) 50 MG tablet Take 1 tablet (50 mg total) by mouth every 6 (six) hours as needed (take one tablet 30 minutes prior to radiation treatments and  every 6 hours as needed thereafter). 30 tablet 1   traZODone (DESYREL) 100 MG tablet TAKE DAILY AS DIRECTED FOR INSOMNIA (Patient not taking: Reported on 04/01/2020)     traZODone (DESYREL) 25 mg TABS tablet Take 25 mg by mouth at bedtime.     No current facility-administered medications for this visit.    PHYSICAL EXAMINATION: ECOG PERFORMANCE STATUS: 2 - Symptomatic, <50% confined to bed  Vitals:   08/18/22 1324  BP: 124/74  Pulse: 66  Resp: 14  Temp: 98 F (36.7 C)  SpO2: 96%   Wt Readings from Last 3 Encounters:  08/18/22 185 lb 9.6 oz (84.2 kg)  06/08/22 188 lb 14.4 oz (85.7 kg)  04/16/22 195 lb (88.5 kg)     NECK: (-) supple, thyroid normal size, non-tender, without nodularity LYMPH:  no palpable lymphadenopathy in the cervical, axillary  LUNGS:(-)  clear to auscultation and percussion with normal breathing effort HEART: (-) regular rate & rhythm and no murmurs and no lower extremity edema ABDOMEN:(-) abdomen soft, non-tender and normal bowel sounds  LABORATORY DATA:  I have reviewed the data as listed    Latest Ref Rng & Units 08/11/2022    1:08 PM 05/18/2022   11:15 AM 03/22/2022    2:00 PM  CBC  WBC 4.0 - 10.5 K/uL 4.1  4.0  3.4   Hemoglobin 13.0 - 17.0  g/dL 14.1  13.3  13.3   Hematocrit 39.0 - 52.0 % 41.1  38.9  38.8   Platelets 150 - 400 K/uL 276  213  236         Latest Ref Rng & Units 08/11/2022    1:08 PM 05/18/2022   11:15 AM 03/22/2022    2:00 PM  CMP  Glucose 70 - 99 mg/dL 97  93  128   BUN 8 - 23 mg/dL '15  18  12   '$ Creatinine 0.61 - 1.24 mg/dL 0.89  0.88  0.88   Sodium 135 - 145 mmol/L 140  138  137   Potassium 3.5 - 5.1 mmol/L 4.5  4.6  4.1   Chloride 98 - 111 mmol/L 104  104  105   CO2 22 - 32 mmol/L 32  29  23   Calcium 8.9 - 10.3 mg/dL 9.1  8.7  8.7   Total Protein 6.5 - 8.1 g/dL 6.3  6.3  6.4   Total Bilirubin 0.3 - 1.2 mg/dL 0.4  0.4  0.4   Alkaline Phos 38 - 126 U/L 103  119  91   AST 15 - 41 U/L '16  13  18   '$ ALT 0 - 44 U/L '11  9  18       '$ RADIOGRAPHIC STUDIES: I have personally reviewed the radiological images as listed and agreed with the findings in the report. No results found.    Orders Placed This Encounter  Procedures   Amb Referral to Palliative Care    Referral Priority:   Routine    Referral Type:   Consultation    Number of Visits Requested:   1   All questions were answered. The patient knows to call the clinic with any problems, questions or concerns. No barriers to learning was detected. The total time spent in the appointment was 45 minutes.     Truitt Merle, MD 08/18/2022   Felicity Coyer, CMA, am acting as scribe for Truitt Merle, MD.   I have reviewed the above documentation for accuracy and completeness, and I agree with  the above.

## 2022-08-18 NOTE — Telephone Encounter (Signed)
LVM with PCP Dr. Kathryne Eriksson with [facility] [phone]).  Stated pt is requesting a refill on Seroquel 70m BID and would like the prescription to be sent to [facility] [place]  Left pt's name and DOB along with telephone callback number.

## 2022-08-19 ENCOUNTER — Other Ambulatory Visit: Payer: Self-pay

## 2022-08-19 ENCOUNTER — Other Ambulatory Visit (HOSPITAL_COMMUNITY): Payer: Self-pay

## 2022-08-19 ENCOUNTER — Telehealth: Payer: Self-pay | Admitting: Pharmacist

## 2022-08-19 ENCOUNTER — Telehealth: Payer: Self-pay | Admitting: Pharmacy Technician

## 2022-08-19 NOTE — Telephone Encounter (Addendum)
Oral Oncology Patient Advocate Encounter  Prior Authorization for Nathan Berg has been approved.    PA# [id number] Effective dates: 07/20/22 through 08/19/23  Patients co-pay is $3,314.36.   Applied for assistance through [employer], patient did not qualify due to income requirements.  Lady Deutscher, CPhT-Adv Oncology Pharmacy Patient [place] Direct Number: [phone]  Fax: [phone]

## 2022-08-19 NOTE — Telephone Encounter (Signed)
Oral Chemotherapy Pharmacist Encounter   Due to high unaffordable copay for Verdene Lennert attempted to apply for patient manufacturer assistance through [employer]. Unfortunately patient's income exceeds the programs allowance and he does not qualify for patient assistance through [employer].   I spoke with Dr. Burr Medico about the above, she is OK with patient staying on Xtandi at this time until a prostate cancer grant becomes available for patient, at which time the Oral Chemotherapy clinic will apply for on the patient's behalf so that patient has coverage for out of pocket cost for [place]. He has already been put on grant wait lists in the event grants open up.   I called and spoke with Mr. Swiggett - he is aware to stay on Xtandi at this time since he is still eligible to receive this at no cost through [employer] assistance program. Assured patient that we would apply for a prostate cancer grant on his behalf as soon as something is available. Patient expressed understanding - no further questions at this time.  Patient has the phone number to the Oral Chemotherapy clinic if he has additional questions or concerns.   Leron Croak, PharmD, BCPS, [facility] Hematology/Oncology Clinical Pharmacist [facility] and [place] [phone] 08/19/2022 10:42 AM

## 2022-08-19 NOTE — Telephone Encounter (Signed)
Oral Oncology Pharmacist Encounter  Received new prescription for Lynparza (olaparib) for the treatment of metastatic prostate cancer, ATM mutated in conjunction with ADT, planned duration until disease progression or unacceptable drug toxicity.  CBC w/ Diff and CMP from 08/11/22 assessed, no relevant lab abnormalities requiring baseline dose adjustment required at this time. PSA from 08/11/22 was 13.9 ng/mL. Prescription dose and frequency assessed for appropriateness. Plan per MD note is for patient to start in ~2 weeks after finishing current Xtandi Rx on hand.   Current medication list in Epic reviewed, no relevant/significant DDIs with [place] identified.  Evaluated chart and no patient barriers to medication adherence noted.   Patient agreement for treatment documented in MD note on 08/18/22.  Prescription has been e-scribed to the [facility] for benefits analysis and approval.  Oral Oncology Clinic will continue to follow for insurance authorization, copayment issues, initial counseling and start date.  Leron Croak, PharmD, BCPS, [facility] Hematology/Oncology Clinical Pharmacist [facility] and [place] [phone] 08/19/2022 8:06 AM

## 2022-08-19 NOTE — Telephone Encounter (Signed)
Oral Oncology Patient Advocate Encounter   Received notification that prior authorization for Nathan Berg is required.   PA submitted on 08/19/22 Key [id number] Status is pending     Nathan Berg, CPhT-Adv Oncology Pharmacy Patient [place] Direct Number: [phone]  Fax: [phone]

## 2022-09-01 ENCOUNTER — Other Ambulatory Visit: Payer: Self-pay

## 2022-09-02 ENCOUNTER — Encounter: Payer: Self-pay | Admitting: Nurse Practitioner

## 2022-09-02 ENCOUNTER — Inpatient Hospital Stay (HOSPITAL_BASED_OUTPATIENT_CLINIC_OR_DEPARTMENT_OTHER): Payer: Medicare Other | Admitting: Nurse Practitioner

## 2022-09-02 VITALS — BP 117/75 | HR 74 | Temp 98.1°F | Resp 18 | Ht 71.0 in | Wt 188.1 lb

## 2022-09-02 DIAGNOSIS — Z7189 Other specified counseling: Secondary | ICD-10-CM

## 2022-09-02 DIAGNOSIS — C61 Malignant neoplasm of prostate: Secondary | ICD-10-CM

## 2022-09-02 DIAGNOSIS — M25512 Pain in left shoulder: Secondary | ICD-10-CM

## 2022-09-02 DIAGNOSIS — G893 Neoplasm related pain (acute) (chronic): Secondary | ICD-10-CM

## 2022-09-02 DIAGNOSIS — M792 Neuralgia and neuritis, unspecified: Secondary | ICD-10-CM

## 2022-09-02 DIAGNOSIS — Z515 Encounter for palliative care: Secondary | ICD-10-CM

## 2022-09-02 DIAGNOSIS — F419 Anxiety disorder, unspecified: Secondary | ICD-10-CM

## 2022-09-02 DIAGNOSIS — R530 Neoplastic (malignant) related fatigue: Secondary | ICD-10-CM

## 2022-09-02 MED ORDER — CELECOXIB 200 MG PO CAPS: 200.0000 mg | ORAL_CAPSULE | Freq: Every day | ORAL | 2 refills | Status: DC

## 2022-09-02 NOTE — Progress Notes (Signed)
Nathan Berg  [phone] [phone]   Name: Nathan Berg Date: 09/02/2022 MRN: [id number]  DOB: 1956-04-16  Patient Care Team: [name], MD as PCP - General (Family Medicine) Truitt Merle, MD as Attending Physician (Hematology and Oncology)    REASON FOR CONSULTATION: Nathan Berg is a 67 y.o. 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 Palliative ask to see for symptom management and goals of care.    SOCIAL HISTORY:     reports that he has never smoked. He has never used smokeless tobacco. He reports current alcohol use. He reports that he does not use drugs.  ADVANCE DIRECTIVES: MOST form  CODE STATUS: DNR  PAST MEDICAL HISTORY: Past Medical History:  Diagnosis Date   Arthritis    Cancer ([place])    dx. 2 months ago- Prostate cancer   GERD (gastroesophageal reflux disease)    H/O seasonal allergies    History of kidney stones    x1   Prostate cancer ([place])     PAST SURGICAL HISTORY:  Past Surgical History:  Procedure Laterality Date   CHEST TUBE INSERTION  07/16/2019   trauma    COLONOSCOPY     HERNIA REPAIR Bilateral    20 yrs ago   LYMPHADENECTOMY Bilateral 09/19/2013   Procedure: LYMPHADENECTOMY  PELVIC LYMPH NODE DISSECTION;  Surgeon: Bernestine Amass, MD;  Location: [facility];  Service: Urology;  Laterality: Bilateral;   PROSTATE BIOPSY     ROBOT ASSISTED LAPAROSCOPIC RADICAL PROSTATECTOMY N/A 09/19/2013   Procedure: ROBOTIC ASSISTED LAPAROSCOPIC RADICAL PROSTATECTOMY;  Surgeon: Bernestine Amass, MD;  Location: [facility];  Service: Urology;  Laterality: N/A;   TONSILLECTOMY     child   VARICOCELE EXCISION      HEMATOLOGY/ONCOLOGY HISTORY:  Oncology History Overview Note   Cancer Staging  Malignant neoplasm of prostate ([place]) Staging form: Prostate, AJCC 7th Edition - Clinical: Stage IV (TX, NX, M1, PSA: Less than 10, Gleason  8-10) - Signed by Wyatt Portela, MD on 04/30/2021     Malignant neoplasm of prostate ([place])  09/19/2013 Initial Diagnosis   Malignant neoplasm of prostate ([place])   04/30/2021 Cancer Staging   Staging form: Prostate, AJCC 7th Edition - Clinical: Stage IV (TX, NX, M1, PSA: Less than 10, Gleason 8-10) - Signed by Wyatt Portela, MD on 04/30/2021   03/11/2022 Imaging    IMPRESSION: 1. Multifocal intense radiotracer avid skeletal metastasis again noted involving the axillary skeleton. No change in pattern or number of lesions. No new lesions identified. 2. The radiotracer activity of the lesions; however, is uniformly increased from comparison exam. 3. The sclerotic pattern of the lesions on comparison CT is a similar. One lesion at T3 as new lytic component. 4. No visceral metastasis.  No new nodal disease.       ALLERGIES:  is allergic to codeine and penicillin g.  MEDICATIONS:  Current Outpatient Medications  Medication Sig Dispense Refill   ALPRAZolam (XANAX) 1 MG tablet TAKE 1/2 TO 1 TABLET BY MOUTH TWICE DAILY AS NEEDED FOR ANXIETY. MUST HAVE OFFICE OR TELEMEDICINE VISIT FOR REFILLS 45 tablet 0   buPROPion (WELLBUTRIN SR) 200 MG 12 hr tablet Take 200 mg by mouth 2 (two) times daily.     escitalopram (LEXAPRO) 20 MG tablet Take by mouth.     gabapentin (NEURONTIN) 300 MG capsule Take 2 in the morning, 1 in the  afternoon 1 at nighttime 120 capsule 3   lidocaine (LIDODERM) 5 % Place 1 patch onto the skin daily. Remove & Discard patch within 12 hours or as directed by MD 30 patch 0   olaparib (LYNPARZA) 150 MG tablet Take 2 tablets (300 mg total) by mouth 2 (two) times daily. Swallow whole. May take with food to decrease nausea and vomiting. 120 tablet 0   QUEtiapine (SEROQUEL) 25 MG tablet Take 50 mg by mouth 2 (two) times daily.     traMADol (ULTRAM) 50 MG tablet Take 1 tablet (50 mg total) by mouth every 6 (six) hours as needed (take one tablet 30 minutes prior to radiation  treatments and every 6 hours as needed thereafter). 30 tablet 1   traZODone (DESYREL) 100 MG tablet TAKE DAILY AS DIRECTED FOR INSOMNIA (Patient not taking: Reported on 04/01/2020)     traZODone (DESYREL) 25 mg TABS tablet Take 25 mg by mouth at bedtime.     No current facility-administered medications for this visit.    VITAL SIGNS: T 98.1, HR 74, R 18, BP 117/75, Oxygen saturation: 97% on room air Estimated body mass index is 25.89 kg/m as calculated from the following:   Height as of 06/08/22: '5\' 11"'$  (1.803 m).   Weight as of 08/18/22: 185 lb 9.6 oz (84.2 kg).  LABS: CBC:    Component Value Date/Time   WBC 4.1 08/11/2022 1308   WBC 8.0 07/17/2019 0322   HGB 14.1 08/11/2022 1308   HCT 41.1 08/11/2022 1308   PLT 276 08/11/2022 1308   MCV 91.1 08/11/2022 1308   NEUTROABS 2.9 08/11/2022 1308   LYMPHSABS 0.7 08/11/2022 1308   MONOABS 0.3 08/11/2022 1308   EOSABS 0.1 08/11/2022 1308   BASOSABS 0.0 08/11/2022 1308   Comprehensive Metabolic Panel:    Component Value Date/Time   NA 140 08/11/2022 1308   K 4.5 08/11/2022 1308   CL 104 08/11/2022 1308   CO2 32 08/11/2022 1308   BUN 15 08/11/2022 1308   CREATININE 0.89 08/11/2022 1308   GLUCOSE 97 08/11/2022 1308   CALCIUM 9.1 08/11/2022 1308   AST 16 08/11/2022 1308   ALT 11 08/11/2022 1308   ALKPHOS 103 08/11/2022 1308   BILITOT 0.4 08/11/2022 1308   PROT 6.3 (L) 08/11/2022 1308   ALBUMIN 3.7 08/11/2022 1308    RADIOGRAPHIC STUDIES: No results found.  PERFORMANCE STATUS (ECOG) : 1 - Symptomatic but completely ambulatory  Review of Systems  Constitutional:  Positive for fatigue.  Gastrointestinal:  Positive for constipation.  Genitourinary:        Incontinence at times.  Musculoskeletal:  Positive for arthralgias.       Unsteady on feet at times.  Neurological:  Positive for weakness.  Psychiatric/Behavioral:  The patient is nervous/anxious.        Depression.  Unless otherwise noted, a complete review of systems  is negative.  Physical Exam General: NAD, well-groomed, well-nourished Cardiovascular: regular rate and rhythm Pulmonary: clear ant fields Abdomen: soft, nontender, + bowel sounds Extremities: no edema, no joint deformities, limited movement to left shoulder/arm Skin: no rashes Neurological: alert and oriented x 4  IMPRESSION: Mr. Kaiser presents to visit alone. No acute distress noted. He is alert and able to engage appropriately in discussions. Ambulatory without assistive devices.   I introduced myself, Nikki NP, May[place] RN, and Palliative's role in collaboration with the oncology team. Concept of Palliative Care was introduced as specialized medical care for people and their families living with serious illness.  It focuses on providing relief from the symptoms and stress of a serious illness.  The goal is to improve quality of life for both the patient and the family. Values and goals of care important to patient and family were attempted to be elicited.   Nathan Berg lives in the home with his wife Mechele Claude of 50+ years. He and his wife have two ponies but no pets inside the home. Two children who live locally. 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 Nathan Berg reports a decline over recent years stating, "I am a fall risk." He is able to perform most ADLs independently.   Anxiety and depression: Reports that the past 5-6 years have been very trying. Has dealt with situational depression related to cancer diagnosis. This is compounded by concern for the health and well-being of his adult children and their life choices.   Patient is currently taking Wellbutrin, Lexapro, and Seroquel to manage depression. Reports that various providers have provided refills in the past and patient is encouraged to see PCP for management/streamlining of regimen. Patient takes Xanax on an as needed basis for anxiety. Reports needing a dose 1-2 x per week.   Fatigue: In addition to  depression, patient endorses severe fatigue that is disabling at times. Nathan Berg feels that his fatigue correlates with when testosterone-depleting therapies began. Reports that during the earlier years of his treatment he used Androgel and this seemed to have given him more energy when using.  Palliative will follow-up with Oncology for discussion regarding whether it would be appropriate to restart Androgel given current oral regimen of treatment.   Neoplasm Pain: Patient endorses arthritic pain to his left shoulder and various other joints. Reports tingling/numbness to extremities s/p cancer treatment. Patient has also experienced injuries (such as a horse kicking and cracking his ribs). Currently taking Ibuprofen 600 mg PO at hs and averages doing this a few times per week. Has been taking Gabapentin on a PRN basis.  Will start daily Celebrex for the management of musculoskeletal pain. For neuropathic pain, patient is encouraged to take Gabapentin on a scheduled basis as it was prescribed.  Constipation: To manage his constipation, patient eats several prunes each night. When it has been more than a couple of days without a bowel movement, patient takes an OTC stool softener with good results.  Safety: Nathan Berg reports being unstable on his feet at times and having fallen. Discussed paying attention to surroundings and making safe changes around the home.   Goals of Care: We discussed Nathan Berg current illness and what it means in the larger context of Her on-going co-morbidities. Natural disease trajectory and expectations were discussed.   Nathan Berg is clear in his expressed wishes to continue to treat the treatable. He is taking life one  day at a time.   Education provided regarding advanced directives and Mr. jehad baack wanting to complete ACP documents. Patient is given information about upcoming ACP clinic to complete HCPOA documentation. ACP provided in preparation of  upcoming visit. Nathan Berg confirms that he would want his wife to speak for him in the event that he is unable to speak for himself. We discussed his code status at length.  Patient confirms wishes for no heroic or life-sustaining measures.  Recommendation for DNR/DNI.  Patient verbalized understanding again confirming wishes for DNR. Out of facility form completed.  Education provided on MOST form.  Patient expressed interest in completing on today.    MOST  form completed as requested. The patient outlined his wishes for the following treatment decisions:  Cardiopulmonary Resuscitation: Do Not Attempt Resuscitation (DNR/No CPR)  Medical Interventions: Limited Additional Interventions: Use medical treatment, IV fluids and cardiac monitoring as indicated, DO NOT USE intubation or mechanical ventilation. May consider use of less invasive airway support such as BiPAP or CPAP. Also provide comfort measures. Transfer to the hospital if indicated. Avoid intensive care.   Antibiotics: Antibiotics if indicated  IV Fluids: IV fluids for a defined trial period  Feeding Tube: No feeding tube    We discussed the importance of continued conversation with family and their medical providers regarding overall plan of care and treatment options, ensuring decisions are within the context of the patients values and GOCs.  PLAN: Established therapeutic relationship. Education provided on palliative's role in collaboration with their Oncology/Radiation team. Encouraged scheduled use of Gabapentin for neuropathic pain. Start daily Celebrex for musculoskeletal pain. Will discuss benefits and risks of Androgel with Oncology to determine if appropriate for patient. DNR/DNI-as requested and confirmed by patient.  Out of facility form completed and original given to patient.  Scanned to [employer]. MOST completed.  Original given to patient.  Copy scanned to [employer].  Referral placed to CSW for ACP document completion.   Palliative will plan to see patient back in 2-4 weeks in collaboration to other oncology appointments.   Patient expressed understanding and was in agreement with this plan. He also understands that He can call the clinic at any time with any questions, concerns, or complaints.   Thank you for your referral and allowing Palliative to assist in Mr. Froilan Berg [facility] care.   Number and complexity of problems addressed: HIGH - 1 or more chronic illnesses with SEVERE exacerbation, progression, or side effects of treatment - advanced cancer, pain. Any controlled substances utilized were prescribed in the context of palliative care.  Time Total: 55 min  I assessed patient with Nathan Dy, NP Student. Agree with above findings.   Visit consisted of counseling and education dealing with the complex and emotionally intense issues of symptom management and palliative care in the setting of serious and potentially life-threatening illness.Greater than 50%  of this time was spent counseling and coordinating care related to the above assessment and plan.  Signed by: Moss Mc, RN MSN [facility] / NP Student  Signed by: Alda Lea, AGPCNP-BC Palliative Medicine Team/[place]

## 2022-09-02 NOTE — Patient Instructions (Addendum)
-   pick up and take Celebrex daily to help with arthritic pain - continue to take gabapentin, 2 pills int he morning and 1 pill at night, to help with nerve type of pain, taking it on a consistent basis can help level out your pain and prevent the really bad days - we will call you next week to check in and see how things are going  - call us with any questions or concerns - call the office or send in a Mychart message 2-3 days before you run out of a medication

## 2022-09-03 ENCOUNTER — Encounter: Payer: Self-pay | Admitting: Hematology

## 2022-09-13 ENCOUNTER — Other Ambulatory Visit: Payer: Self-pay

## 2022-09-13 ENCOUNTER — Inpatient Hospital Stay: Payer: Medicare Other | Admitting: Licensed Clinical Social Worker

## 2022-09-13 ENCOUNTER — Inpatient Hospital Stay: Payer: Medicare Other | Attending: Oncology

## 2022-09-13 DIAGNOSIS — F419 Anxiety disorder, unspecified: Secondary | ICD-10-CM | POA: Diagnosis not present

## 2022-09-13 DIAGNOSIS — R5383 Other fatigue: Secondary | ICD-10-CM | POA: Diagnosis not present

## 2022-09-13 DIAGNOSIS — G893 Neoplasm related pain (acute) (chronic): Secondary | ICD-10-CM | POA: Diagnosis not present

## 2022-09-13 DIAGNOSIS — C7951 Secondary malignant neoplasm of bone: Secondary | ICD-10-CM | POA: Insufficient documentation

## 2022-09-13 DIAGNOSIS — Z79899 Other long term (current) drug therapy: Secondary | ICD-10-CM | POA: Diagnosis not present

## 2022-09-13 DIAGNOSIS — C61 Malignant neoplasm of prostate: Secondary | ICD-10-CM | POA: Insufficient documentation

## 2022-09-13 DIAGNOSIS — F32A Depression, unspecified: Secondary | ICD-10-CM | POA: Diagnosis not present

## 2022-09-13 LAB — CMP (CANCER CENTER ONLY)
ALT: 13 U/L (ref 0–44)
AST: 20 U/L (ref 15–41)
Albumin: 3.6 g/dL (ref 3.5–5.0)
Alkaline Phosphatase: 97 U/L (ref 38–126)
Anion gap: 5 (ref 5–15)
BUN: 11 mg/dL (ref 8–23)
CO2: 28 mmol/L (ref 22–32)
Calcium: 8.5 mg/dL — ABNORMAL LOW (ref 8.9–10.3)
Chloride: 105 mmol/L (ref 98–111)
Creatinine: 0.91 mg/dL (ref 0.61–1.24)
GFR, Estimated: >60 mL/min (ref >60)
Glucose, Bld: 98 mg/dL (ref 70–99)
Potassium: 4.3 mmol/L (ref 3.5–5.1)
Sodium: 138 mmol/L (ref 135–145)
Total Bilirubin: 0.4 mg/dL (ref 0.3–1.2)
Total Protein: 6.1 g/dL — ABNORMAL LOW (ref 6.5–8.1)

## 2022-09-13 LAB — CBC WITH DIFFERENTIAL (CANCER CENTER ONLY)
Abs Immature Granulocytes: 0.01 10*3/uL (ref 0.00–0.07)
Basophils Absolute: 0 10*3/uL (ref 0.0–0.1)
Basophils Relative: 1 %
Eosinophils Absolute: 0.1 10*3/uL (ref 0.0–0.5)
Eosinophils Relative: 3 %
HCT: 36.4 % — ABNORMAL LOW (ref 39.0–52.0)
Hemoglobin: 12.7 g/dL — ABNORMAL LOW (ref 13.0–17.0)
Immature Granulocytes: 0 %
Lymphocytes Relative: 22 %
Lymphs Abs: 0.8 10*3/uL (ref 0.7–4.0)
MCH: 31.7 pg (ref 26.0–34.0)
MCHC: 34.9 g/dL (ref 30.0–36.0)
MCV: 90.8 fL (ref 80.0–100.0)
Monocytes Absolute: 0.4 10*3/uL (ref 0.1–1.0)
Monocytes Relative: 11 %
Neutro Abs: 2.2 10*3/uL (ref 1.7–7.7)
Neutrophils Relative %: 63 %
Platelet Count: 241 10*3/uL (ref 150–400)
RBC: 4.01 MIL/uL — ABNORMAL LOW (ref 4.22–5.81)
RDW: 12.7 % (ref 11.5–15.5)
WBC Count: 3.6 10*3/uL — ABNORMAL LOW (ref 4.0–10.5)
nRBC: 0 % (ref 0.0–0.2)

## 2022-09-13 NOTE — Progress Notes (Signed)
[place] Social Work  Patient presented to [facility]  to review and complete healthcare advance directives.  Clinical Social Worker met with patient.  The patient designated his wife Cannon Jennette [phone]) as their primary healthcare agent and his son Lamare [phone]) as their secondary agent.  Patient also completed healthcare living will.    Documents were notarized and copies made for patient/family. Clinical Social Worker will send documents to medical records to be scanned into patient's chart. Clinical Social Worker encouraged patient/family to contact with any additional questions or concerns.   Henriette Combs, [place] Worker [facility]

## 2022-09-13 NOTE — Progress Notes (Unsigned)
[place]  [phone] [phone]) [phone]   Name: Nathan Berg Date: 09/13/2022 MRN: [id number]  DOB: October 08, 1955  Patient Care Team: [name], MD as PCP - General (Family Medicine) Truitt Merle, MD as Attending Physician (Hematology and Oncology)    INTERVAL HISTORY: Nathan Berg is a 67 y.o. 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 Palliative ask to see for symptom management and goals of care.     SOCIAL HISTORY:     reports that he has never smoked. He has never used smokeless tobacco. He reports current alcohol use. He reports that he does not use drugs.  ADVANCE DIRECTIVES:  DNR/MOST on file  CODE STATUS: DNR  PAST MEDICAL HISTORY: Past Medical History:  Diagnosis Date   Arthritis    Cancer ([place])    dx. 2 months ago- Prostate cancer   GERD (gastroesophageal reflux disease)    H/O seasonal allergies    History of kidney stones    x1   Prostate cancer ([place])     ALLERGIES:  is allergic to codeine and penicillin g.  MEDICATIONS:  Current Outpatient Medications  Medication Sig Dispense Refill   ALPRAZolam (XANAX) 1 MG tablet TAKE 1/2 TO 1 TABLET BY MOUTH TWICE DAILY AS NEEDED FOR ANXIETY. MUST HAVE OFFICE OR TELEMEDICINE VISIT FOR REFILLS 45 tablet 0   buPROPion (WELLBUTRIN SR) 200 MG 12 hr tablet Take 200 mg by mouth 2 (two) times daily.     celecoxib (CELEBREX) 200 MG capsule Take 1 capsule (200 mg total) by mouth daily in the afternoon. 30 capsule 2   escitalopram (LEXAPRO) 20 MG tablet Take by mouth.     gabapentin (NEURONTIN) 300 MG capsule Take 2 in the morning, 1 in the afternoon 1 at nighttime 120 capsule 3   lidocaine (LIDODERM) 5 % Place 1 patch onto the skin daily. Remove & Discard patch within 12 hours or as directed by MD 30 patch 0   olaparib (LYNPARZA) 150 MG tablet Take 2 tablets (300 mg total) by mouth 2 (two)  times daily. Swallow whole. May take with food to decrease nausea and vomiting. 120 tablet 0   QUEtiapine (SEROQUEL) 25 MG tablet Take 50 mg by mouth 2 (two) times daily.     traMADol (ULTRAM) 50 MG tablet Take 1 tablet (50 mg total) by mouth every 6 (six) hours as needed (take one tablet 30 minutes prior to radiation treatments and every 6 hours as needed thereafter). 30 tablet 1   traZODone (DESYREL) 100 MG tablet TAKE DAILY AS DIRECTED FOR INSOMNIA (Patient not taking: Reported on 04/01/2020)     traZODone (DESYREL) 25 mg TABS tablet Take 25 mg by mouth at bedtime.     No current facility-administered medications for this visit.    VITAL SIGNS: There were no vitals taken for this visit. There were no vitals filed for this visit.  Estimated body mass index is 26.23 kg/m as calculated from the following:   Height as of 09/02/22: '5\' 11"'$  (1.803 m).   Weight as of 09/02/22: 188 lb 1.6 oz (85.3 kg).   PERFORMANCE STATUS (ECOG) : 1 - Symptomatic but completely ambulatory   Physical Exam General: NAD Cardiovascular: regular rate and rhythm Pulmonary: clear ant fields Abdomen: soft, nontender, + bowel sounds GU: no suprapubic tenderness Extremities: no edema, no joint deformities Skin: no rashes Neurological:   IMPRESSION: ***  Anxiety and  depression:    Fatigue: .    Neoplasm Pain: Patient endorses arthritic pain to his left shoulder and various other joints. Reports tingling/numbness to extremities s/p cancer treatment. Patient has also experienced injuries (such as a horse kicking and cracking his ribs). Currently taking Ibuprofen 600 mg PO at hs and averages doing this a few times per week. Has been taking Gabapentin on a PRN basis.   Will start daily Celebrex for the management of musculoskeletal pain. For neuropathic pain, patient is encouraged to take Gabapentin on a scheduled basis as it was prescribed.   Constipation:  Safety:     Goals of Care:   09/02/22-We  discussed Nathan Berg current illness and what it means in the larger context of Her on-going co-morbidities. Natural disease trajectory and expectations were discussed.    Nathan Berg is clear in his expressed wishes to continue to treat the treatable. He is taking life one  day at a time.    Education provided regarding advanced directives and Nathan Berg wanting to complete ACP documents. Patient is given information about upcoming ACP clinic to complete HCPOA documentation. ACP provided in preparation of upcoming visit. Nathan Berg confirms that he would want his wife to speak for him in the event that he is unable to speak for himself. We discussed his code status at length.  Patient confirms wishes for no heroic or life-sustaining measures.  Recommendation for DNR/DNI.  Patient verbalized understanding again confirming wishes for DNR. Out of facility form completed.  Education provided on MOST form.  Patient expressed interest in completing on today.   We discussed Her current illness and what it means in the larger context of Her on-going co-morbidities. Natural disease trajectory and expectations were discussed.  I discussed the importance of continued conversation with family and their medical providers regarding overall plan of care and treatment options, ensuring decisions are within the context of the patients values and GOCs.  PLAN: Established therapeutic relationship. Education provided on palliative's role in collaboration with their Oncology/Radiation team. Encouraged scheduled use of Gabapentin for neuropathic pain. Start daily Celebrex for musculoskeletal pain. Will discuss benefits and risks of Androgel with Oncology to determine if appropriate for patient. DNR/DNI-as requested and confirmed by patient.  Out of facility form completed and original given to patient.  Scanned to [employer]. MOST completed.  Original given to patient.  Copy scanned to [employer].  Referral placed to CSW  for ACP document completion.  Palliative will plan to see patient back in 2-4 weeks in collaboration to other oncology appointments.    Patient expressed understanding and was in agreement with this plan. He also understands that He can call the clinic at any time with any questions, concerns, or complaints.   Any controlled substances utilized were prescribed in the context of palliative care. PDMP has been reviewed.   Time Total: ***  Visit consisted of counseling and education dealing with the complex and emotionally intense issues of symptom management and palliative care in the setting of serious and potentially life-threatening illness.Greater than 50%  of this time was spent counseling and coordinating care related to the above assessment and plan.  Alda Lea, AGPCNP-BC  Palliative Medicine Team/[place]  *Please note that this is a verbal dictation therefore any spelling or grammatical errors are due to the "[employer]" system interpretation.

## 2022-09-14 NOTE — Assessment & Plan Note (Signed)
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   -s/p robotic assisted laparoscopic radical prostatectomy and bilateral lymph node dissection in March 2015, pT2N0 -He subsequently received salvage radiation under the care of Dr. Tammi Klippel after receiving 38.4 Pearline Cables in 38 fractions between May 06, 2014 and June 26, 2014.  He has received 6 months of androgen deprivation utilizing Lupron.  -He developed metastasis based on pet imaging on September 16, 2020.  -Zytiga 1000 mg with prednisone given in July 2022 to 09/2020 -he has been on  Xtandi 160 mg daily started in August 2022, and Eligard as needed. His PSA level has been slowing trending up  -he finished a course of RT on 05/07/2022. His repeated PSA was 13.9 on 08/11/2022,which is similar to 3 months ago.  -due to his worsening body pain, and fatigue, I recommend her to chage to PARP inhibitor Lynparza. Unfortunately he has unaffordable high co-pay, and does not qualify for financial assistance.  Orenitram may continue Xtandi for now, until a prostate Dennie Fetters is available. -We discussed chemo option, such as docetaxel.

## 2022-09-15 ENCOUNTER — Other Ambulatory Visit: Payer: Self-pay

## 2022-09-15 ENCOUNTER — Inpatient Hospital Stay (HOSPITAL_BASED_OUTPATIENT_CLINIC_OR_DEPARTMENT_OTHER): Payer: Medicare Other | Admitting: Hematology

## 2022-09-15 ENCOUNTER — Encounter: Payer: Self-pay | Admitting: Hematology

## 2022-09-15 ENCOUNTER — Inpatient Hospital Stay (HOSPITAL_BASED_OUTPATIENT_CLINIC_OR_DEPARTMENT_OTHER): Payer: Medicare Other | Admitting: Nurse Practitioner

## 2022-09-15 ENCOUNTER — Encounter: Payer: Self-pay | Admitting: Nurse Practitioner

## 2022-09-15 VITALS — BP 131/80 | HR 75 | Temp 97.6°F | Resp 16 | Ht 71.0 in | Wt 191.8 lb

## 2022-09-15 DIAGNOSIS — R530 Neoplastic (malignant) related fatigue: Secondary | ICD-10-CM

## 2022-09-15 DIAGNOSIS — C61 Malignant neoplasm of prostate: Secondary | ICD-10-CM

## 2022-09-15 DIAGNOSIS — G893 Neoplasm related pain (acute) (chronic): Secondary | ICD-10-CM

## 2022-09-15 DIAGNOSIS — F419 Anxiety disorder, unspecified: Secondary | ICD-10-CM | POA: Diagnosis not present

## 2022-09-15 DIAGNOSIS — C7951 Secondary malignant neoplasm of bone: Secondary | ICD-10-CM | POA: Diagnosis not present

## 2022-09-15 DIAGNOSIS — Z515 Encounter for palliative care: Secondary | ICD-10-CM | POA: Diagnosis not present

## 2022-09-15 LAB — PROSTATE-SPECIFIC AG, SERUM (LABCORP): Prostate Specific Ag, Serum: 13.9 ng/mL — ABNORMAL HIGH (ref 0.0–4.0)

## 2022-09-15 LAB — TESTOSTERONE: Testosterone: 13 ng/dL — ABNORMAL LOW (ref 264–916)

## 2022-09-15 NOTE — Progress Notes (Signed)
[place]   [phone] [phone]   Clinic Follow up Note   Patient Care Team: [name], MD as PCP - General (Family Medicine) Truitt Merle, MD as Attending Physician (Hematology and Oncology)  Date of Service:  09/15/2022  CHIEF COMPLAINT: f/u of Prostate Cancer    CURRENT THERAPY:  Eligard 30 mg started on September 18, 2020.  He receives Eligard if his testosterone increases above 50.   Xtandi 160 mg daily started in August 2022.   ASSESSMENT:  Nathan Berg is a 67 y.o. 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   -s/p robotic assisted laparoscopic radical prostatectomy and bilateral lymph node dissection in March 2015, pT2N0 -He subsequently received salvage radiation under the care of Dr. Tammi Klippel after receiving 38.4 Pearline Cables in 38 fractions between May 06, 2014 and June 26, 2014.  He has received 6 months of androgen deprivation utilizing Lupron.  -He developed metastasis based on pet imaging on September 16, 2020.  -Zytiga 1000 mg with prednisone given in July 2022 to 09/2020 -he has been on  Xtandi 160 mg daily started in August 2022, and Eligard as needed. His PSA level has been slowing trending up  -he finished a course of RT on 05/07/2022. His repeated PSA was 13.9 on 08/11/2022,which is similar to 3 months ago.  -due to his worsening body pain, and fatigue, I recommend her to chage to PARP inhibitor Lynparza. Unfortunately he has unaffordable high co-pay, and does not qualify for financial assistance.  Orenitram may continue Xtandi for now, until a prostate Dennie Fetters is available. -We discussed chemo option, such as docetaxel. Will hold it for now.   Depression  -on meds   Cancer related pain -f/u with palliative care NP Lexine Baton, he is on Celebrex and gabapentin   PLAN: -Continue to stay on the Xtandi -Discuss  Testosterone levels, will give Eligard if testosterone level above 50, -Discuss PSA leve overall stable l -encourage the pt do exercise and stationary bike. -Discuss Chemo therapy as an option in future such as Docetaxel. -f/u in 2 months -I encouraged him to follow-up with his primary care physician    SUMMARY OF ONCOLOGIC HISTORY: Oncology History Overview Note   Cancer Staging  Malignant neoplasm of prostate ([place]) Staging form: Prostate, AJCC 7th Edition - Clinical: Stage IV (TX, NX, M1, PSA: Less than 10, Gleason 8-10) - Signed by Wyatt Portela, MD on 04/30/2021     Malignant neoplasm of prostate ([place])  09/19/2013 Initial Diagnosis   Malignant neoplasm of prostate ([place])   04/30/2021 Cancer Staging   Staging form: Prostate, AJCC 7th Edition - Clinical: Stage IV (TX, NX, M1, PSA: Less than 10, Gleason 8-10) - Signed by Wyatt Portela, MD on 04/30/2021   03/11/2022 Imaging    IMPRESSION: 1. Multifocal intense radiotracer avid skeletal metastasis again noted involving the axillary skeleton. No change in pattern or number of lesions. No new lesions identified. 2. The radiotracer activity of the lesions; however, is uniformly increased from comparison exam. 3. The sclerotic pattern of the lesions on comparison CT is a similar. One lesion at T3 as new lytic component. 4. No visceral metastasis.  No new nodal disease.        INTERVAL HISTORY:  Nathan Berg is here for a follow up of  Prostate Cancer  He was last seen by me on 08/18/2022 He presents to the clinic alone. Pt state that he has pain all over his body.  Pt state that he see. Dr Redmond Pulling.     All other systems were reviewed with the patient and are negative.  MEDICAL HISTORY:  Past Medical History:  Diagnosis Date   Arthritis    Cancer ([place])    dx. 2 months ago- Prostate cancer   GERD (gastroesophageal reflux disease)    H/O seasonal allergies    History of kidney stones    x1   Prostate cancer ([place])      SURGICAL HISTORY: Past Surgical History:  Procedure Laterality Date   CHEST TUBE INSERTION  07/16/2019   trauma    COLONOSCOPY     HERNIA REPAIR Bilateral    20 yrs ago   LYMPHADENECTOMY Bilateral 09/19/2013   Procedure: LYMPHADENECTOMY  PELVIC LYMPH NODE DISSECTION;  Surgeon: Bernestine Amass, MD;  Location: [facility];  Service: Urology;  Laterality: Bilateral;   PROSTATE BIOPSY     ROBOT ASSISTED LAPAROSCOPIC RADICAL PROSTATECTOMY N/A 09/19/2013   Procedure: ROBOTIC ASSISTED LAPAROSCOPIC RADICAL PROSTATECTOMY;  Surgeon: Bernestine Amass, MD;  Location: [facility];  Service: Urology;  Laterality: N/A;   TONSILLECTOMY     child   VARICOCELE EXCISION      I have reviewed the social history and family history with the patient and they are unchanged from previous note.  ALLERGIES:  is allergic to codeine and penicillin g.  MEDICATIONS:  Current Outpatient Medications  Medication Sig Dispense Refill   ALPRAZolam (XANAX) 1 MG tablet TAKE 1/2 TO 1 TABLET BY MOUTH TWICE DAILY AS NEEDED FOR ANXIETY. MUST HAVE OFFICE OR TELEMEDICINE VISIT FOR REFILLS 45 tablet 0   buPROPion (WELLBUTRIN SR) 200 MG 12 hr tablet Take 200 mg by mouth 2 (two) times daily.     celecoxib (CELEBREX) 200 MG capsule Take 1 capsule (200 mg total) by mouth daily in the afternoon. 30 capsule 2   escitalopram (LEXAPRO) 20 MG tablet Take by mouth.     gabapentin (NEURONTIN) 300 MG capsule Take 2 in the morning, 1 in the afternoon 1 at nighttime 120 capsule 3   lidocaine (LIDODERM) 5 % Place 1 patch onto the skin daily. Remove & Discard patch within 12 hours or as directed by MD 30 patch 0   olaparib (LYNPARZA) 150 MG tablet Take 2 tablets (300 mg total) by mouth 2 (two) times daily. Swallow whole. May take with food to decrease nausea and vomiting. 120 tablet 0   QUEtiapine (SEROQUEL) 25 MG tablet Take 50 mg by mouth 2 (two) times daily.     traMADol (ULTRAM) 50 MG tablet Take 1 tablet (50 mg total) by mouth every 6 (six) hours as  needed (take one tablet 30 minutes prior to radiation treatments and every 6 hours as needed thereafter). 30 tablet 1   traZODone (DESYREL) 100 MG tablet TAKE DAILY AS DIRECTED FOR INSOMNIA (Patient not taking: Reported on 04/01/2020)     traZODone (DESYREL) 25 mg TABS tablet Take 25 mg by mouth at bedtime.     No current facility-administered medications for this visit.    PHYSICAL EXAMINATION: ECOG PERFORMANCE STATUS: 2 - Symptomatic, <50% confined to bed  Vitals:   09/15/22 1030  BP: 131/80  Pulse: 75  Resp: 16  Temp: 97.6 F (36.4 C)  SpO2: 98%   Wt Readings from Last 3 Encounters:  09/15/22 191 lb 12.8 oz (87 kg)  09/02/22 188 lb 1.6 oz (85.3 kg)  08/18/22 185 lb 9.6 oz (84.2 kg)     GENERAL:alert, no distress and comfortable SKIN: skin color normal, no rashes or significant lesions EYES: normal, Conjunctiva are pink and non-injected, sclera clear  NEURO: alert & oriented x 3 with fluent speech   LABORATORY DATA:  I have reviewed the data as listed    Latest Ref Rng & Units 09/13/2022   11:38 AM 08/11/2022    1:08 PM 05/18/2022   11:15 AM  CBC  WBC 4.0 - 10.5 K/uL 3.6  4.1  4.0   Hemoglobin 13.0 - 17.0 g/dL 12.7  14.1  13.3   Hematocrit 39.0 - 52.0 % 36.4  41.1  38.9   Platelets 150 - 400 K/uL 241  276  213         Latest Ref Rng & Units 09/13/2022   11:38 AM 08/11/2022    1:08 PM 05/18/2022   11:15 AM  CMP  Glucose 70 - 99 mg/dL 98  97  93   BUN 8 - 23 mg/dL '11  15  18   '$ Creatinine 0.61 - 1.24 mg/dL 0.91  0.89  0.88   Sodium 135 - 145 mmol/L 138  140  138   Potassium 3.5 - 5.1 mmol/L 4.3  4.5  4.6   Chloride 98 - 111 mmol/L 105  104  104   CO2 22 - 32 mmol/L 28  32  29   Calcium 8.9 - 10.3 mg/dL 8.5  9.1  8.7   Total Protein 6.5 - 8.1 g/dL 6.1  6.3  6.3   Total Bilirubin 0.3 - 1.2 mg/dL 0.4  0.4  0.4   Alkaline Phos 38 - 126 U/L 97  103  119   AST 15 - 41 U/L '20  16  13   '$ ALT 0 - 44 U/L '13  11  9       '$ RADIOGRAPHIC STUDIES: I have personally  reviewed the radiological images as listed and agreed with the findings in the report. No results found.    No orders of the defined types were placed in this encounter.  All questions were answered. The patient knows to call the clinic with any problems, questions or concerns. No barriers to learning was detected. The total time spent in the appointment was 30 minutes.     Truitt Merle, MD 09/15/2022   Felicity Coyer, CMA, am acting as scribe for Truitt Merle, MD.   I have reviewed the above documentation for accuracy and completeness, and I agree with the above.

## 2022-09-27 ENCOUNTER — Encounter: Payer: Self-pay | Admitting: Hematology

## 2022-09-29 ENCOUNTER — Other Ambulatory Visit: Payer: Self-pay

## 2022-10-13 ENCOUNTER — Encounter: Payer: Self-pay | Admitting: Hematology

## 2022-10-13 ENCOUNTER — Other Ambulatory Visit (HOSPITAL_COMMUNITY): Payer: Self-pay

## 2022-10-13 ENCOUNTER — Telehealth: Payer: Self-pay | Admitting: Pharmacy Technician

## 2022-10-13 NOTE — Telephone Encounter (Signed)
Oral Oncology Pharmacist Encounter  Nathan Berg has been obtained for patient to help with copay for Nathan Berg (olaparib). Dr. Mosetta Putt notified of this. Per MD patient will remain on Xtandi at this time (patient currently receiving this for manufacturer assistance at no cost) and they will revisit possible switch to [name] at patient's next office visit in May.  Lenord Carbo, PharmD, BCPS, BCOP Hematology/Oncology Clinical Pharmacist [facility] Oral Chemotherapy Navigation Clinics [phone] 10/13/2022 4:20 PM

## 2022-10-13 NOTE — Telephone Encounter (Signed)
Oral Oncology Patient Advocate Encounter   Was successful in securing patient an $79 grant from [employer] [facility]) to provide copayment coverage for Lynparza.  This will keep the out of pocket expense at $0.     I have spoken with the patient.    The billing information is as follows and has been shared with [facility].   Member ID: [id number] Group ID: [id number] RxBin: [id number] Dates of Eligibility: 10/12/22 through 10/10/23  Fund:  Prostate  Jinger Neighbors, CPhT-Adv Oncology Pharmacy Patient Advocate [facility] Direct Number: [phone]  Fax: [phone]

## 2022-10-18 ENCOUNTER — Other Ambulatory Visit (HOSPITAL_COMMUNITY): Payer: Self-pay

## 2022-10-18 ENCOUNTER — Telehealth: Payer: Self-pay | Admitting: Pharmacy Technician

## 2022-10-18 ENCOUNTER — Encounter: Payer: Self-pay | Admitting: Hematology

## 2022-10-18 NOTE — Telephone Encounter (Signed)
Oral Oncology Patient Advocate Encounter   Was successful in securing patient a $5,500 grant from [employer] ([employer]) to provide copayment coverage for Lynparza.  This will keep the out of pocket expense at $0.    The billing information is as follows and has been shared with [facility].   RxBin: [id number] PCN:  [id number] Member ID: [id number] Group ID: [id number] Dates of Eligibility: 10/18/22 through 10/18/23  Jinger Neighbors, CPhT-Adv Oncology Pharmacy Patient Advocate [facility] Direct Number: [phone]  Fax: [phone]

## 2022-10-22 ENCOUNTER — Emergency Department (HOSPITAL_COMMUNITY): Payer: Medicare Other

## 2022-10-22 ENCOUNTER — Emergency Department (HOSPITAL_COMMUNITY)
Admission: EM | Admit: 2022-10-22 | Discharge: 2022-10-22 | Disposition: A | Discharge status: Home / Self Care | Payer: Medicare Other | Attending: Emergency Medicine | Admitting: Emergency Medicine

## 2022-10-22 DIAGNOSIS — Z23 Encounter for immunization: Secondary | ICD-10-CM | POA: Diagnosis not present

## 2022-10-22 DIAGNOSIS — Y9301 Activity, walking, marching and hiking: Secondary | ICD-10-CM | POA: Diagnosis not present

## 2022-10-22 DIAGNOSIS — S31133A Puncture wound of abdominal wall without foreign body, right lower quadrant without penetration into peritoneal cavity, initial encounter: Secondary | ICD-10-CM | POA: Insufficient documentation

## 2022-10-22 DIAGNOSIS — R791 Abnormal coagulation profile: Secondary | ICD-10-CM | POA: Insufficient documentation

## 2022-10-22 DIAGNOSIS — N200 Calculus of kidney: Secondary | ICD-10-CM | POA: Insufficient documentation

## 2022-10-22 DIAGNOSIS — Z79899 Other long term (current) drug therapy: Secondary | ICD-10-CM | POA: Insufficient documentation

## 2022-10-22 DIAGNOSIS — Z8546 Personal history of malignant neoplasm of prostate: Secondary | ICD-10-CM | POA: Insufficient documentation

## 2022-10-22 DIAGNOSIS — W01198A Fall on same level from slipping, tripping and stumbling with subsequent striking against other object, initial encounter: Secondary | ICD-10-CM | POA: Insufficient documentation

## 2022-10-22 DIAGNOSIS — K449 Diaphragmatic hernia without obstruction or gangrene: Secondary | ICD-10-CM | POA: Diagnosis not present

## 2022-10-22 DIAGNOSIS — S31139A Puncture wound of abdominal wall without foreign body, unspecified quadrant without penetration into peritoneal cavity, initial encounter: Secondary | ICD-10-CM

## 2022-10-22 DIAGNOSIS — S3991XA Unspecified injury of abdomen, initial encounter: Secondary | ICD-10-CM | POA: Diagnosis present

## 2022-10-22 LAB — CBC
HCT: 35.7 % — ABNORMAL LOW (ref 39.0–52.0)
Hemoglobin: 12.3 g/dL — ABNORMAL LOW (ref 13.0–17.0)
MCH: 31.5 pg (ref 26.0–34.0)
MCHC: 34.5 g/dL (ref 30.0–36.0)
MCV: 91.3 fL (ref 80.0–100.0)
Platelets: 231 10*3/uL (ref 150–400)
RBC: 3.91 MIL/uL — ABNORMAL LOW (ref 4.22–5.81)
RDW: 13.2 % (ref 11.5–15.5)
WBC: 2.8 10*3/uL — ABNORMAL LOW (ref 4.0–10.5)
nRBC: 0 % (ref 0.0–0.2)

## 2022-10-22 LAB — ETHANOL: Alcohol, Ethyl (B): <10 mg/dL (ref <10)

## 2022-10-22 LAB — I-STAT CHEM 8, ED
BUN: 14 mg/dL (ref 8–23)
Calcium, Ion: 1.15 mmol/L (ref 1.15–1.40)
Chloride: 103 mmol/L (ref 98–111)
Creatinine, Ser: 0.9 mg/dL (ref 0.61–1.24)
Glucose, Bld: 108 mg/dL — ABNORMAL HIGH (ref 70–99)
HCT: 36 % — ABNORMAL LOW (ref 39.0–52.0)
Hemoglobin: 12.2 g/dL — ABNORMAL LOW (ref 13.0–17.0)
Potassium: 3.9 mmol/L (ref 3.5–5.1)
Sodium: 139 mmol/L (ref 135–145)
TCO2: 27 mmol/L (ref 22–32)

## 2022-10-22 LAB — COMPREHENSIVE METABOLIC PANEL
ALT: 12 U/L (ref 0–44)
AST: 17 U/L (ref 15–41)
Albumin: 3 g/dL — ABNORMAL LOW (ref 3.5–5.0)
Alkaline Phosphatase: 87 U/L (ref 38–126)
Anion gap: 10 (ref 5–15)
BUN: 11 mg/dL (ref 8–23)
CO2: 23 mmol/L (ref 22–32)
Calcium: 8.1 mg/dL — ABNORMAL LOW (ref 8.9–10.3)
Chloride: 104 mmol/L (ref 98–111)
Creatinine, Ser: 0.95 mg/dL (ref 0.61–1.24)
GFR, Estimated: >60 mL/min (ref >60)
Glucose, Bld: 110 mg/dL — ABNORMAL HIGH (ref 70–99)
Potassium: 4 mmol/L (ref 3.5–5.1)
Sodium: 137 mmol/L (ref 135–145)
Total Bilirubin: 0.1 mg/dL — ABNORMAL LOW (ref 0.3–1.2)
Total Protein: 5.7 g/dL — ABNORMAL LOW (ref 6.5–8.1)

## 2022-10-22 LAB — PROTIME-INR
INR: 1 (ref 0.8–1.2)
Prothrombin Time: 12.7 seconds (ref 11.4–15.2)

## 2022-10-22 LAB — SAMPLE TO BLOOD BANK

## 2022-10-22 MED ORDER — TETANUS-DIPHTH-ACELL PERTUSSIS 5-2.5-18.5 LF-MCG/0.5 IM SUSY
0.5000 mL | PREFILLED_SYRINGE | Freq: Once | INTRAMUSCULAR | Status: AC
  Administered 2022-10-22: 0.5 mL via INTRAMUSCULAR

## 2022-10-22 MED ORDER — CEFAZOLIN SODIUM-DEXTROSE 2-4 GM/100ML-% IV SOLN
2.0000 g | Freq: Once | INTRAVENOUS | Status: AC
  Administered 2022-10-22: 2 g via INTRAVENOUS

## 2022-10-22 MED ORDER — METHOCARBAMOL 750 MG PO TABS: 750.0000 mg | ORAL_TABLET | Freq: Four times a day (QID) | ORAL | 1 refills | Status: DC

## 2022-10-22 MED ORDER — IBUPROFEN 600 MG PO TABS: 600.0000 mg | ORAL_TABLET | Freq: Four times a day (QID) | ORAL | 1 refills | Status: DC

## 2022-10-22 MED ORDER — TETANUS-DIPHTHERIA TOXOIDS TD 5-2 LFU IM INJ: 0.5000 mL | INJECTION | Freq: Once | INTRAMUSCULAR | Status: DC

## 2022-10-22 MED ORDER — DOCUSATE SODIUM 100 MG PO CAPS: 100.0000 mg | ORAL_CAPSULE | Freq: Two times a day (BID) | ORAL | 2 refills | Status: DC

## 2022-10-22 MED ORDER — ACETAMINOPHEN 500 MG PO TABS: 1000.0000 mg | ORAL_TABLET | Freq: Four times a day (QID) | ORAL | 3 refills | Status: DC

## 2022-10-22 MED ORDER — OXYCODONE HCL 5 MG PO TABS: 5.0000 mg | ORAL_TABLET | ORAL | 0 refills | Status: DC | PRN

## 2022-10-22 MED ORDER — MORPHINE SULFATE (PF) 4 MG/ML IV SOLN
4.0000 mg | Freq: Once | INTRAVENOUS | Status: AC
  Administered 2022-10-22: 4 mg via INTRAVENOUS
  Filled 2022-10-22: qty 1

## 2022-10-22 MED ORDER — IOHEXOL 350 MG/ML SOLN
75.0000 mL | Freq: Once | INTRAVENOUS | Status: AC | PRN
  Administered 2022-10-22: 75 mL via INTRAVENOUS

## 2022-10-22 NOTE — H&P (Signed)
   TRAUMA H&P  10/22/2022, 7:27 PM   Chief Complaint: Level 1 trauma activation for penetrating trauma to abdomen  Primary Survey:  ABC's intact on arrival  The patient is an 67 y.o. male.   HPI: 5M s/p mechanical GLF, landed on a tree stump/stick that was upright in the ground. Patient reports ~1in penetration and measures the stick/tree branch about thumb width. HD normal for EMS. Denies hitting his head or any LOC at the time of the fall.   No past medical history on file.  No pertinent family history.  Social History:  has no history on file for tobacco use, alcohol use, and drug use.     Allergies: Not on File  Medications: reviewed  No results found for this or any previous visit (from the past 48 hour(s)).  No results found.  ROS 10 point review of systems is negative except as listed above in HPI.  Blood pressure (!) 116/93, pulse 72, resp. rate 13, height  (1.803 m), weight 83.9 kg, SpO2 98 %.  Secondary Survey:  GCS: E(4)//V(5)//M(6) Constitutional: well-developed, well-nourished Skull: normocephalic, atraumatic Eyes: pupils equal, round, reactive to light, 2mm b/l, moist conjunctiva Face/ENT: midface stable without deformity, normal  dentition, external inspection of ears and nose normal, hearing intact  Oropharynx: normal oropharyngeal mucosa, no blood,   Neck: no thyromegaly, trachea midline, c-collar not applied due to mechanism, no midline cervical tenderness to palpation, no C-spine stepoffs Chest: breath sounds equal bilaterally, normal  respiratory effort, no midline or lateral chest wall tenderness to palpation/deformity Abdomen: soft, wound in RLQ with peri-wound TTP, no bruising, no hepatosplenomegaly FAST: negative Pelvis: stable GU: no blood at urethral meatus of penis, no scrotal masses or abnormality Back: no wounds, no T/L spine TTP, no T/L spine stepoffs Rectal: deferred Extremities: 2+  radial and pedal pulses bilaterally, intact motor  and sensation of bilateral UE and LE, no peripheral edema MSK: unable to assess gait/station, no clubbing/cyanosis of fingers/toes, normal ROM of all four extremities Skin: warm, dry, no rashes  CXR in TB: unremarkable Pelvis XR in TB: unremarkable    Assessment/Plan: Problem List Fall, penetrating trauma to abdomen  Plan Penetrating trauma to abdomen - no intra-abdominal entry on CT. Local wound care. Offered loose closure with a single staple and patient declines.  FEN - regular diet Dispo - Discharge  Family update: provided to wife at bedside  Diamantina Monks, MD General and Trauma Surgery [facility] Surgery

## 2022-10-22 NOTE — ED Notes (Addendum)
Patient transported to CT with TRN, Bedelia Person MD

## 2022-10-22 NOTE — ED Notes (Addendum)
Pt returned from CT with TRN  

## 2022-10-22 NOTE — ED Notes (Signed)
Wound irrigated, antibacterial ointment, steri strips, xeroform dressing applied. Wound care instructions given.

## 2022-10-22 NOTE — Progress Notes (Signed)
Orthopedic Tech Progress Note Patient Details:  Nathan Berg [birth year] [id number]  Patient ID: Nathan Berg, male   DOB: 10/21/1955, 67 y.o.   MRN: [id number] Level I; not currently needed. Darleen Crocker 10/22/2022, 7:39 PM

## 2022-10-22 NOTE — Progress Notes (Signed)
Trauma Response Nurse Documentation   Nathan Berg is a 67 y.o. male arriving to [facility] ED via EMS  On No antithrombotic. Trauma was activated as a Level 1 by Thayer Ohm based on the following trauma criteria Penetrating wounds to the head, neck, chest, & abdomen . Trauma team at the bedside on patient arrival.   Patient cleared for CT by Dr. Bedelia Person. Pt transported to CT with trauma response nurse present to monitor. RN remained with the patient throughout their absence from the department for clinical observation.   GCS 15.  Pt offered choice of admission vs discharge home, pt requested to be discharged.  History   No past medical history on file.        Initial Focused Assessment (If applicable, or please see trauma documentation):  Pt arrived via [facility] as level 1 for penetrating wound to RLQ abd. Pt was walking outside and fell, landing on a tree stump, with stick puncturing his abdomen. Pt GCS 15, no blood thinners, denies hitting head. Airway, breathing and circulation within defined limits. Bleeding controlled upon arrival, abd pad in place. EMS started 18g in LAC. Pt denies pain.  CT's Completed:   CT abdomen/pelvis w/ contrast   Interventions:   Plan for disposition:  Discharge home   Consults completed:  none    Bedside handoff with ED RN [place] RN.    Ruqayyah Lute E Rishard Delange  Trauma Response RN  Please call TRN at [phone] for further assistance.

## 2022-10-22 NOTE — ED Triage Notes (Signed)
Pt arrived via [facility] for level 1, penetrating injury to abdomen from home. Pt was walking outside when he fell, landing on tree stump. Pt reports stick "the size of my thumb" punctured lower right abdomen, removed by pt. Reports approximately 1 inch penetration depth. Bleeding controlled with ABD pad by EMS, minimal blood loss noted. Adipose tissue can be seen. Marland Kitchen GCS 15. Abdomen tender, but soft, non-distended. No other injuries from fall noted. 18g LAC.   PTA EMS Vitals BP 118/78 HR 78 RR 18 SPO2 93% RA

## 2022-10-22 NOTE — ED Provider Notes (Signed)
[place] EMERGENCY DEPARTMENT AT [facility] Provider Note   CSN: [id number] Arrival date & time: 10/22/22  4098     History  Chief Complaint  Patient presents with   Abdominal Injury   Trauma    Nathan Berg is a 67 y.o. male.  Pt is a 67 yo male with pmhx significant for prostate cancer, gerd, kidney stones, and arthritis.  Pt tripped and fell outside and landed on a tree branch.  The branch impaled him about 1 inch into lower right abd.  Pt denies any other injuries from fall.  He was brought to the ED as a level 1 trauma.         Home Medications Prior to Admission medications   Medication Sig Start Date End Date Taking? Authorizing Provider  acetaminophen (TYLENOL) 500 MG tablet Take 2 tablets (1,000 mg total) by mouth 4 (four) times daily. 10/22/22 10/22/23 Yes Lovick, Lennie Odor, MD  docusate sodium (COLACE) 100 MG capsule Take 1 capsule (100 mg total) by mouth 2 (two) times daily. 10/22/22 10/22/23 Yes Lovick, Lennie Odor, MD  ibuprofen (ADVIL) 600 MG tablet Take 1 tablet (600 mg total) by mouth 4 (four) times daily. 10/22/22  Yes Diamantina Monks, MD  methocarbamol (ROBAXIN-750) 750 MG tablet Take 1 tablet (750 mg total) by mouth 4 (four) times daily. 10/22/22  Yes Lovick, Lennie Odor, MD  oxyCODONE (ROXICODONE) 5 MG immediate release tablet Take 1 tablet (5 mg total) by mouth every 4 (four) hours as needed. 10/22/22  Yes Diamantina Monks, MD      Allergies    Patient has no allergy information on record.    Review of Systems   Review of Systems  Gastrointestinal:  Positive for abdominal pain.  All other systems reviewed and are negative.   Physical Exam Updated Vital Signs BP 110/70   Pulse 78   Temp 98.3 F (36.8 C) (Oral)   Resp 14   Ht 5\' 11"  (1.803 m)   Wt 83.9 kg   SpO2 98%   BMI 25.80 kg/m  Physical Exam Vitals and nursing note reviewed.  Constitutional:      Appearance: Normal appearance.  HENT:     Head: Normocephalic and atraumatic.      Right Ear: External ear normal.     Left Ear: External ear normal.     Nose: Nose normal.     Mouth/Throat:     Mouth: Mucous membranes are moist.     Pharynx: Oropharynx is clear.  Eyes:     Extraocular Movements: Extraocular movements intact.     Conjunctiva/sclera: Conjunctivae normal.     Pupils: Pupils are equal, round, and reactive to light.  Cardiovascular:     Rate and Rhythm: Normal rate and regular rhythm.     Pulses: Normal pulses.     Heart sounds: Normal heart sounds.  Pulmonary:     Effort: Pulmonary effort is normal.     Breath sounds: Normal breath sounds.  Abdominal:     General: Abdomen is flat. Bowel sounds are normal.     Palpations: Abdomen is soft.     Comments: Puncture wound right lower abd.  See picture.  Musculoskeletal:        General: Normal range of motion.     Cervical back: Normal range of motion and neck supple.  Skin:    General: Skin is warm.     Capillary Refill: Capillary refill takes less than 2 seconds.  Neurological:  General: No focal deficit present.     Mental Status: He is alert and oriented to person, place, and time.  Psychiatric:        Mood and Affect: Mood normal.        Behavior: Behavior normal.     ED Results / Procedures / Treatments   Labs (all labs ordered are listed, but only abnormal results are displayed) Labs Reviewed  COMPREHENSIVE METABOLIC PANEL - Abnormal; Notable for the following components:      Result Value   Glucose, Bld 110 (*)    Calcium 8.1 (*)    Total Protein 5.7 (*)    Albumin 3.0 (*)    Total Bilirubin 0.1 (*)    All other components within normal limits  CBC - Abnormal; Notable for the following components:   WBC 2.8 (*)    RBC 3.91 (*)    Hemoglobin 12.3 (*)    HCT 35.7 (*)    All other components within normal limits  I-STAT CHEM 8, ED - Abnormal; Notable for the following components:   Glucose, Bld 108 (*)    Hemoglobin 12.2 (*)    HCT 36.0 (*)    All other components within  normal limits  ETHANOL  PROTIME-INR  URINALYSIS, ROUTINE W REFLEX MICROSCOPIC  LACTIC ACID, PLASMA  SAMPLE TO BLOOD BANK    EKG None  Radiology CT ABDOMEN PELVIS W CONTRAST  Result Date: 10/22/2022 CLINICAL DATA:  Penetrating abdominal trauma EXAM: CT ABDOMEN AND PELVIS WITH CONTRAST TECHNIQUE: Multidetector CT imaging of the abdomen and pelvis was performed using the standard protocol following bolus administration of intravenous contrast. RADIATION DOSE REDUCTION: This exam was performed according to the departmental dose-optimization program which includes automated exposure control, adjustment of the mA and/or kV according to patient size and/or use of iterative reconstruction technique. CONTRAST:  75mL OMNIPAQUE IOHEXOL 350 MG/ML SOLN COMPARISON:  None Available. FINDINGS: Lower chest: Visualized lung bases are clear. Moderate hiatal hernia. Calcification of the aortic valve leaflets. Global cardiac size within normal limits. No pericardial effusion. Hepatobiliary: No focal liver abnormality is seen. No gallstones, gallbladder wall thickening, or biliary dilatation. Pancreas: Unremarkable Spleen: Unremarkable Adrenals/Urinary Tract: The adrenal glands are unremarkable. The kidneys are normal in size and position. 7 mm nonobstructing calculus within the lower pole of the left kidney. Mild left hydronephrosis with decompression of the left ureter suggesting a mild underlying left UPJ obstruction, unlikely of clinical significance. The kidneys are otherwise unremarkable with symmetric cortical enhancement and preserved renal cortical thickness. Bladder unremarkable. Stomach/Bowel: Moderate stool throughout the colon the intra-abdominal stomach, small bowel, and large bowel are otherwise unremarkable. No evidence of obstruction or focal inflammation. No free intraperitoneal gas or fluid. No mesenteric infiltration, particularly within the pericolonic fat of the right abdomen despite subcutaneous gas  within the right lower quadrant anterior abdominal wall in the region of penetrating trauma Vascular/Lymphatic: Mild aortoiliac atherosclerotic calcification. No aortic aneurysm. No pathologic adenopathy within the abdomen and pelvis Reproductive: Status post prostatectomy. Other: Superficial laceration noted within the right lower quadrant anterior abdominal wall with extensive subcutaneous gas in this region in keeping with history of penetrating trauma. Gas tracks between the internal and external oblique musculature, however, does not appear to penetrate the deep abdominal fascia. Musculoskeletal: No acute bone abnormality. Degenerative changes are seen within the lumbar spine. IMPRESSION: 1. Superficial laceration within the right lower quadrant anterior abdominal wall with extensive subcutaneous gas in this region in keeping with history of penetrating trauma. Gas tracks  between the internal and external oblique musculature, however, does not appear to penetrate the deep abdominal fascia. No acute intra-abdominal injury. 2. Moderate hiatal hernia. 3. Mild left nonobstructing nephrolithiasis. 4. Moderate stool throughout the colon. 5. These results were called by telephone at the time of interpretation on 10/22/2022 at 7:53 pm to provider Bedelia Person, MD, who verbally acknowledged these results. Electronically Signed   By: Helyn Numbers M.D.   On: 10/22/2022 19:58   DG Pelvis Portable  Result Date: 10/22/2022 CLINICAL DATA:  Trauma EXAM: PORTABLE PELVIS 1-2 VIEWS COMPARISON:  None Available. FINDINGS: There is no evidence of pelvic fracture or diastasis. No pelvic bone lesions are seen. IMPRESSION: Negative. Electronically Signed   By: Charlett Nose M.D.   On: 10/22/2022 19:49   DG Chest Port 1 View  Result Date: 10/22/2022 CLINICAL DATA:  Trauma EXAM: PORTABLE CHEST 1 VIEW COMPARISON:  CXR 09/06/21 FINDINGS: Low lung volumes. Likely trace left pleural effusion. No pneumothorax. Left basilar atelectasis. There  are prominent bilateral interstitial opacities which are favored to represent bronchovascular crowding in the setting of low lung volumes. No radiographically apparent displaced rib fractures. Visualized upper abdomen is unremarkable. IMPRESSION: 1. Low lung volumes with prominent bilateral interstitial opacities which are favored to represent bronchovascular crowding in the setting of low lung volumes. 2. Likely trace left pleural effusion. 3. No radiographically apparent displaced rib fractures. Electronically Signed   By: Lorenza Cambridge M.D.   On: 10/22/2022 19:48    Procedures Procedures    Medications Ordered in ED Medications  ceFAZolin (ANCEF) IVPB 2g/100 mL premix (0 g Intravenous Stopped 10/22/22 1930)  Tdap (BOOSTRIX) injection 0.5 mL (0.5 mLs Intramuscular Given 10/22/22 1915)  iohexol (OMNIPAQUE) 350 MG/ML injection 75 mL (75 mLs Intravenous Contrast Given 10/22/22 1940)    ED Course/ Medical Decision Making/ A&P                             Medical Decision Making Amount and/or Complexity of Data Reviewed Labs: ordered. Radiology: ordered.  Risk Prescription drug management.   This patient presents to the ED for concern of puncture wound, this involves an extensive number of treatment options, and is a complaint that carries with it a high risk of complications and morbidity.  The differential diagnosis includes puncture into peritoneal cavity   Co morbidities that complicate the patient evaluation  prostate cancer, gerd, kidney stones, and arthritis   Additional history obtained:  Additional history obtained from epic chart review External records from outside source obtained and reviewed including EMS report   Lab Tests:  I Ordered, and personally interpreted labs.  The pertinent results include:  cmp nl, cbc with wbc low at 2.8 and hgb low at 12.3 (chronic)   Imaging Studies ordered:  I ordered imaging studies including chest/pelvis/ct abd/pelvis  I  independently visualized and interpreted imaging which showed  CXR: Low lung volumes with prominent bilateral interstitial opacities  which are favored to represent bronchovascular crowding in the  setting of low lung volumes.  2. Likely trace left pleural effusion.  3. No radiographically apparent displaced rib fractures.  Pelvis: Negative.  CT abd/pelvis: Superficial laceration within the right lower quadrant anterior  abdominal wall with extensive subcutaneous gas in this region in  keeping with history of penetrating trauma. Gas tracks between the  internal and external oblique musculature, however, does not appear  to penetrate the deep abdominal fascia. No acute intra-abdominal  injury.  2.  Moderate hiatal hernia.  3. Mild left nonobstructing nephrolithiasis.  4. Moderate stool throughout the colon.   I agree with the radiologist interpretation   Cardiac Monitoring:  The patient was maintained on a cardiac monitor.  I personally viewed and interpreted the cardiac monitored which showed an underlying rhythm of: nsr   Medicines ordered and prescription drug management:  I ordered medication including ancef and tetanus  for sx  Reevaluation of the patient after these medicines showed that the patient improved I have reviewed the patients home medicines and have made adjustments as needed   Test Considered:  ct  Consultations Obtained:  I requested consultation with the trauma surgeon (Dr. Bedelia Person),  and discussed lab and imaging findings as well as pertinent plan - pt offered admission for obs, but he wants to go home.  Pt is to f/u in her office next week.   Problem List / ED Course:  Puncture wound lower abd:  pt offered closure vs leaving wound open.  He chose to leave wound open.  Dr. Bedelia Person offered admission, but he wants to go home.  Wound irrigated with 1L NS.  Pt is stable for d/c.  Return if worse.    Reevaluation:  After the interventions noted above, I  reevaluated the patient and found that they have :improved   Social Determinants of Health:  Lives at home   Dispostion:  After consideration of the diagnostic results and the patients response to treatment, I feel that the patent would benefit from discharge with outpatient follow up..          Final Clinical Impression(s) / ED Diagnoses Final diagnoses:  Puncture wound of abdomen, initial encounter    Rx / DC Orders ED Discharge Orders          Ordered    acetaminophen (TYLENOL) 500 MG tablet  4 times daily        10/22/22 2038    methocarbamol (ROBAXIN-750) 750 MG tablet  4 times daily        10/22/22 2038    ibuprofen (ADVIL) 600 MG tablet  4 times daily        10/22/22 2038    oxyCODONE (ROXICODONE) 5 MG immediate release tablet  Every 4 hours PRN        10/22/22 2038    docusate sodium (COLACE) 100 MG capsule  2 times daily        10/22/22 2038              Jacalyn Lefevre, MD 10/22/22 2057

## 2022-10-25 ENCOUNTER — Encounter: Payer: Self-pay | Admitting: Hematology

## 2022-11-05 ENCOUNTER — Encounter: Payer: Self-pay | Admitting: Hematology

## 2022-11-05 NOTE — Progress Notes (Signed)
  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    Beams/energy:   A 3D field set-up was employed with 6 MV X-rays  Narrative: The patient tolerated radiation treatment relatively well.    Plan: The patient has completed radiation treatment. The patient will return to radiation oncology clinic for routine followup in one month. I advised him to call or return sooner if he has any questions or concerns related to his recovery or treatment. ________________________________  Artist Pais. Kathrynn Running, M.D.

## 2022-11-09 ENCOUNTER — Other Ambulatory Visit: Payer: Self-pay

## 2022-11-15 ENCOUNTER — Other Ambulatory Visit: Payer: Self-pay

## 2022-11-15 ENCOUNTER — Other Ambulatory Visit: Payer: Self-pay | Admitting: Hematology

## 2022-11-16 ENCOUNTER — Inpatient Hospital Stay: Payer: Medicare Other | Attending: Oncology

## 2022-11-16 DIAGNOSIS — C7951 Secondary malignant neoplasm of bone: Secondary | ICD-10-CM | POA: Insufficient documentation

## 2022-11-16 DIAGNOSIS — C61 Malignant neoplasm of prostate: Secondary | ICD-10-CM | POA: Insufficient documentation

## 2022-11-16 DIAGNOSIS — F32A Depression, unspecified: Secondary | ICD-10-CM | POA: Insufficient documentation

## 2022-11-16 DIAGNOSIS — F419 Anxiety disorder, unspecified: Secondary | ICD-10-CM | POA: Insufficient documentation

## 2022-11-16 LAB — CMP (CANCER CENTER ONLY)
ALT: 12 U/L (ref 0–44)
AST: 17 U/L (ref 15–41)
Albumin: 3.7 g/dL (ref 3.5–5.0)
Alkaline Phosphatase: 102 U/L (ref 38–126)
Anion gap: 6 (ref 5–15)
BUN: 16 mg/dL (ref 8–23)
CO2: 28 mmol/L (ref 22–32)
Calcium: 8.5 mg/dL — ABNORMAL LOW (ref 8.9–10.3)
Chloride: 105 mmol/L (ref 98–111)
Creatinine: 0.94 mg/dL (ref 0.61–1.24)
GFR, Estimated: >60 mL/min (ref >60)
Glucose, Bld: 91 mg/dL (ref 70–99)
Potassium: 4.4 mmol/L (ref 3.5–5.1)
Sodium: 139 mmol/L (ref 135–145)
Total Bilirubin: 0.4 mg/dL (ref 0.3–1.2)
Total Protein: 6.4 g/dL — ABNORMAL LOW (ref 6.5–8.1)

## 2022-11-16 LAB — CBC WITH DIFFERENTIAL (CANCER CENTER ONLY)
Abs Immature Granulocytes: 0.01 10*3/uL (ref 0.00–0.07)
Basophils Absolute: 0 10*3/uL (ref 0.0–0.1)
Basophils Relative: 1 %
Eosinophils Absolute: 0.1 10*3/uL (ref 0.0–0.5)
Eosinophils Relative: 2 %
HCT: 37.5 % — ABNORMAL LOW (ref 39.0–52.0)
Hemoglobin: 12.8 g/dL — ABNORMAL LOW (ref 13.0–17.0)
Immature Granulocytes: 0 %
Lymphocytes Relative: 19 %
Lymphs Abs: 0.8 10*3/uL (ref 0.7–4.0)
MCH: 30.8 pg (ref 26.0–34.0)
MCHC: 34.1 g/dL (ref 30.0–36.0)
MCV: 90.4 fL (ref 80.0–100.0)
Monocytes Absolute: 0.5 10*3/uL (ref 0.1–1.0)
Monocytes Relative: 11 %
Neutro Abs: 2.9 10*3/uL (ref 1.7–7.7)
Neutrophils Relative %: 67 %
Platelet Count: 254 10*3/uL (ref 150–400)
RBC: 4.15 MIL/uL — ABNORMAL LOW (ref 4.22–5.81)
RDW: 13 % (ref 11.5–15.5)
WBC Count: 4.4 10*3/uL (ref 4.0–10.5)
nRBC: 0 % (ref 0.0–0.2)

## 2022-11-18 ENCOUNTER — Other Ambulatory Visit: Payer: Self-pay

## 2022-11-18 ENCOUNTER — Inpatient Hospital Stay (HOSPITAL_BASED_OUTPATIENT_CLINIC_OR_DEPARTMENT_OTHER): Payer: Medicare Other | Admitting: Hematology

## 2022-11-18 ENCOUNTER — Inpatient Hospital Stay: Payer: Medicare Other

## 2022-11-18 VITALS — BP 124/81 | HR 76 | Temp 98.3°F | Resp 15 | Ht 71.0 in | Wt 184.3 lb

## 2022-11-18 DIAGNOSIS — F419 Anxiety disorder, unspecified: Secondary | ICD-10-CM

## 2022-11-18 DIAGNOSIS — Z7189 Other specified counseling: Secondary | ICD-10-CM

## 2022-11-18 DIAGNOSIS — C61 Malignant neoplasm of prostate: Secondary | ICD-10-CM

## 2022-11-18 LAB — TESTOSTERONE: Testosterone: 16 ng/dL — ABNORMAL LOW (ref 264–916)

## 2022-11-18 LAB — PROSTATE-SPECIFIC AG, SERUM (LABCORP): Prostate Specific Ag, Serum: 18.7 ng/mL — ABNORMAL HIGH (ref 0.0–4.0)

## 2022-11-18 MED ORDER — GABAPENTIN 300 MG PO CAPS: ORAL_CAPSULE | ORAL | 3 refills | Status: DC

## 2022-11-18 NOTE — Progress Notes (Signed)
[facility]   Telephone:([phone]) [phone] [phone]   Clinic Follow up Note   Patient Care Team: Malachy Mood, MD as PCP - General (Hematology) Malachy Mood, MD as Attending Physician (Hematology and Oncology) Barbie Banner, MD (Family Medicine)  Date of Service:  11/18/2022  CHIEF COMPLAINT: f/u of Prostate Cancer    CURRENT THERAPY:   Eligard 30 mg started on September 18, 2020.  He receives Eligard if his testosterone increases above 50.   Xtandi 160 mg daily started in August 2022.  Plan to start Zometa every 3 months after dental clearance  ASSESSMENT:  Nathan Berg is a 67 y.o. 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   -s/p robotic assisted laparoscopic radical prostatectomy and bilateral lymph node dissection in March 2015, pT2N0 -He subsequently received salvage radiation under the care of Dr. Kathrynn Running after receiving 38.4 Wallace Cullens in 38 fractions between May 06, 2014 and June 26, 2014.  He has received 6 months of androgen deprivation utilizing Lupron.  -He developed metastasis based on pet imaging on September 16, 2020.  -Zytiga 1000 mg with prednisone given in July 2022 to 09/2020 -he has been on  Xtandi 160 mg daily started in August 2022, and Eligard as needed. His PSA level has been slowing trending up  -he finished a course of RT on 05/07/2022. His repeated PSA was 13.9 on 08/11/2022,which is similar to 3 months ago.  -due to his worsening body pain, and fatigue, I recommend her to chage to PARP inhibitor Lynparza. Unfortunately he has unaffordable high co-pay, and does not qualify for financial assistance.  Will continue Xtandi for now, until a prostate Kennedy Bucker is available. -We discussed chemo option, such as docetaxel. Will hold it for now.  -Due to side effect, he has self reduced Xtandi and dose to 120 mg daily, his recent PSA  has slightly increased to 18.7 (13-14 in past 9 months).  -Will check with our pharmacy again, if prostate Ileene Rubens is available, plan to switch him to [name]. -Will get a restaging PSME PET scan in the next 2 to 3 weeks   Bone metastasis  -He is on calcium and vitamin D - given his not very well-controlled prostate cancer, and normal renal function, I recommend Zometa infusion every 3 months.  I discussed benefit and potential side effects, especially risk of jaw necrosis.  He voiced good understanding and agrees to proceed. -He will get dental clearance first   Anxiety and depression -He is on Lexapro, Wellbutrin, Seroquel and trazodone, previously prescribed by his PCP and Dr. Clelia Croft -He is open to see a psychiatrist, palliative care NP Lowella Bandy previously referred to him, we will follow-up on that.    PLAN: -lab reviewed -I will message a pharmacist for financial assistant for Lynparza. -I refill Gabapentin -continue the Xtandi til Thalia Party is approved and delivered. -I order PET (PSMA) scan for a new baseline -Recommend OTC calcium w/vitamin D -I discussed Zometa with pt and its benefts -lab and f/u in 5 weeks     SUMMARY OF ONCOLOGIC HISTORY: Oncology History Overview Note   Cancer Staging  Malignant neoplasm of prostate (HCC) Staging form: Prostate, AJCC 7th Edition - Clinical: Stage IV (TX, NX, M1, PSA: Less than 10, Gleason 8-10) - Signed by Benjiman Core, MD on 04/30/2021     Malignant neoplasm  of prostate (HCC)  09/19/2013 Initial Diagnosis   Malignant neoplasm of prostate (HCC)   04/30/2021 Cancer Staging   Staging form: Prostate, AJCC 7th Edition - Clinical: Stage IV (TX, NX, M1, PSA: Less than 10, Gleason 8-10) - Signed by Benjiman Core, MD on 04/30/2021   03/11/2022 Imaging    IMPRESSION: 1. Multifocal intense radiotracer avid skeletal metastasis again noted involving the axillary skeleton. No change in pattern or number of lesions. No new lesions  identified. 2. The radiotracer activity of the lesions; however, is uniformly increased from comparison exam. 3. The sclerotic pattern of the lesions on comparison CT is a similar. One lesion at T3 as new lytic component. 4. No visceral metastasis.  No new nodal disease.        INTERVAL HISTORY:  Nathan Berg is here for a follow up of Prostate Cancer . He was last seen by me on 09/15/2022. He presents to the clinic alone. Pt stated that he change his Xtandi from 4 tablets to 3 tablets. He has been taking 3 tablets for 1 month.Pt fell and had a puncture from a tree branch in his lowe abdomen about a month ago. Pt state that he appetite is not good. Pt state he still has pain all over is body and rates the pain at a 4. Pt state that he takes care of his parents and he's dealing with personal issues.      All other systems were reviewed with the patient and are negative.  MEDICAL HISTORY:  Past Medical History:  Diagnosis Date   Arthritis    Cancer (HCC)    dx. 2 months ago- Prostate cancer   GERD (gastroesophageal reflux disease)    H/O seasonal allergies    History of kidney stones    x1   Prostate cancer (HCC)     SURGICAL HISTORY: Past Surgical History:  Procedure Laterality Date   CHEST TUBE INSERTION  07/16/2019   trauma    COLONOSCOPY     HERNIA REPAIR Bilateral    20 yrs ago   LYMPHADENECTOMY Bilateral 09/19/2013   Procedure: LYMPHADENECTOMY  PELVIC LYMPH NODE DISSECTION;  Surgeon: Valetta Fuller, MD;  Location: [facility];  Service: Urology;  Laterality: Bilateral;   PROSTATE BIOPSY     ROBOT ASSISTED LAPAROSCOPIC RADICAL PROSTATECTOMY N/A 09/19/2013   Procedure: ROBOTIC ASSISTED LAPAROSCOPIC RADICAL PROSTATECTOMY;  Surgeon: Valetta Fuller, MD;  Location: [facility];  Service: Urology;  Laterality: N/A;   TONSILLECTOMY     child   VARICOCELE EXCISION      I have reviewed the social history and family history with the patient and they are unchanged from previous  note.  ALLERGIES:  is allergic to codeine and penicillin g.  MEDICATIONS:  Current Outpatient Medications  Medication Sig Dispense Refill   acetaminophen (TYLENOL) 500 MG tablet Take 2 tablets (1,000 mg total) by mouth 4 (four) times daily. 120 tablet 3   ALPRAZolam (XANAX) 1 MG tablet TAKE 1/2 TO 1 TABLET BY MOUTH TWICE DAILY AS NEEDED FOR ANXIETY. MUST HAVE OFFICE OR TELEMEDICINE VISIT FOR REFILLS 45 tablet 0   buPROPion (WELLBUTRIN SR) 200 MG 12 hr tablet Take 200 mg by mouth 2 (two) times daily.     celecoxib (CELEBREX) 200 MG capsule Take 1 capsule (200 mg total) by mouth daily in the afternoon. 30 capsule 2   docusate sodium (COLACE) 100 MG capsule Take 1 capsule (100 mg total) by mouth 2 (two) times daily. 60 capsule 2  escitalopram (LEXAPRO) 20 MG tablet Take by mouth.     gabapentin (NEURONTIN) 300 MG capsule Take 2 in the morning, 1 in the afternoon 1 at nighttime 120 capsule 3   ibuprofen (ADVIL) 600 MG tablet Take 1 tablet (600 mg total) by mouth 4 (four) times daily. 120 tablet 1   lidocaine (LIDODERM) 5 % Place 1 patch onto the skin daily. Remove & Discard patch within 12 hours or as directed by MD 30 patch 0   methocarbamol (ROBAXIN-750) 750 MG tablet Take 1 tablet (750 mg total) by mouth 4 (four) times daily. 120 tablet 1   QUEtiapine (SEROQUEL) 25 MG tablet Take 50 mg by mouth 2 (two) times daily.     traMADol (ULTRAM) 50 MG tablet Take 1 tablet (50 mg total) by mouth every 6 (six) hours as needed (take one tablet 30 minutes prior to radiation treatments and every 6 hours as needed thereafter). 30 tablet 1   traZODone (DESYREL) 100 MG tablet TAKE DAILY AS DIRECTED FOR INSOMNIA (Patient not taking: Reported on 04/01/2020)     traZODone (DESYREL) 25 mg TABS tablet Take 25 mg by mouth at bedtime.     XTANDI 40 MG tablet Take 4 tablets by mouth once daily as directed by physician. 120 tablet 0   No current facility-administered medications for this visit.    PHYSICAL  EXAMINATION: ECOG PERFORMANCE STATUS: 1 - Symptomatic but completely ambulatory  Vitals:   11/18/22 1354  BP: 124/81  Pulse: 76  Resp: 15  Temp: 98.3 F (36.8 C)  SpO2: 95%   Wt Readings from Last 3 Encounters:  11/18/22 184 lb 4.8 oz (83.6 kg)  10/22/22 185 lb (83.9 kg)  09/15/22 191 lb 12.8 oz (87 kg)     GENERAL:alert, no distress and comfortable SKIN: skin color normal, no rashes or significant lesions EYES: normal, Conjunctiva are pink and non-injected, sclera clear  NEURO: alert & oriented x 3 with fluent speech GENERAL:alert, no distress and comfortable SKIN: skin color, texture, turgor are normal, no rashes or significant lesions EYES: normal, Conjunctiva are pink and non-injected, sclera clear LABORATORY DATA:  I have reviewed the data as listed    Latest Ref Rng & Units 11/16/2022    9:51 AM 10/22/2022    7:32 PM 10/22/2022    7:11 PM  CBC  WBC 4.0 - 10.5 K/uL 4.4   2.8   Hemoglobin 13.0 - 17.0 g/dL 54.0  98.1  19.1   Hematocrit 39.0 - 52.0 % 37.5  36.0  35.7   Platelets 150 - 400 K/uL 254   231         Latest Ref Rng & Units 11/16/2022    9:51 AM 10/22/2022    7:32 PM 10/22/2022    7:11 PM  CMP  Glucose 70 - 99 mg/dL 91  478  295   BUN 8 - 23 mg/dL 16  14  11    Creatinine 0.61 - 1.24 mg/dL 6.21  3.08  6.57   Sodium 135 - 145 mmol/L 139  139  137   Potassium 3.5 - 5.1 mmol/L 4.4  3.9  4.0   Chloride 98 - 111 mmol/L 105  103  104   CO2 22 - 32 mmol/L 28   23   Calcium 8.9 - 10.3 mg/dL 8.5   8.1   Total Protein 6.5 - 8.1 g/dL 6.4   5.7   Total Bilirubin 0.3 - 1.2 mg/dL 0.4   0.1   Alkaline Phos 38 -  126 U/L 102   87   AST 15 - 41 U/L 17   17   ALT 0 - 44 U/L 12   12       RADIOGRAPHIC STUDIES: I have personally reviewed the radiological images as listed and agreed with the findings in the report. No results found.    Orders Placed This Encounter  Procedures   NM PET (PSMA) SKULL TO MID THIGH    Standing Status:   Future    Standing Expiration  Date:   11/18/2023    Order Specific Question:   If indicated for the ordered procedure, I authorize the administration of a radiopharmaceutical per Radiology protocol    Answer:   Yes    Order Specific Question:   Preferred imaging location?    Answer:   [facility]    Order Specific Question:   Release to patient    Answer:   Immediate   All questions were answered. The patient knows to call the clinic with any problems, questions or concerns. No barriers to learning was detected. The total time spent in the appointment was 30 minutes.     Malachy Mood, MD 11/18/2022   Carolin Coy, CMA, am acting as scribe for Malachy Mood, MD.   I have reviewed the above documentation for accuracy and completeness, and I agree with the above.

## 2022-11-18 NOTE — Assessment & Plan Note (Signed)
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   -s/p robotic assisted laparoscopic radical prostatectomy and bilateral lymph node dissection in March 2015, pT2N0 -He subsequently received salvage radiation under the care of Dr. Kathrynn Running after receiving 38.4 Wallace Cullens in 38 fractions between May 06, 2014 and June 26, 2014.  He has received 6 months of androgen deprivation utilizing Lupron.  -He developed metastasis based on pet imaging on September 16, 2020.  -Zytiga 1000 mg with prednisone given in July 2022 to 09/2020 -he has been on  Xtandi 160 mg daily started in August 2022, and Eligard as needed. His PSA level has been slowing trending up  -he finished a course of RT on 05/07/2022. His repeated PSA was 13.9 on 08/11/2022,which is similar to 3 months ago.  -due to his worsening body pain, and fatigue, I recommend her to chage to PARP inhibitor Lynparza. Unfortunately he has unaffordable high co-pay, and does not qualify for financial assistance.  Will continue Xtandi for now, until a prostate Kennedy Bucker is available. -We discussed chemo option, such as docetaxel. Will hold it for now.

## 2022-11-19 ENCOUNTER — Other Ambulatory Visit: Payer: Self-pay

## 2022-11-19 ENCOUNTER — Telehealth: Payer: Self-pay

## 2022-11-19 ENCOUNTER — Other Ambulatory Visit (HOSPITAL_COMMUNITY): Payer: Self-pay

## 2022-11-19 ENCOUNTER — Other Ambulatory Visit: Payer: Self-pay | Admitting: Hematology

## 2022-11-19 MED ORDER — ONDANSETRON HCL 8 MG PO TABS: 8.0000 mg | ORAL_TABLET | Freq: Three times a day (TID) | ORAL | 1 refills | Status: DC | PRN

## 2022-11-19 MED ORDER — OLAPARIB 150 MG PO TABS
300.0000 mg | ORAL_TABLET | Freq: Two times a day (BID) | ORAL | 0 refills | Status: DC
  Filled 2022-11-19 (×2): qty 120, 30d supply, fill #0

## 2022-11-19 NOTE — Telephone Encounter (Signed)
Oral Chemotherapy Pharmacist Encounter  I spoke with patient today for overview of: Nathan Berg for the treatment of metastatic prostate cancer, ATM mutated in conjunction with ADT, planned duration until disease progression or unacceptable toxicity.   Counseled patient on administration, dosing, side effects, monitoring, drug-food interactions, safe handling, storage, and disposal. CBC and CMP from 11/16/22 assessed, no interventions needed. Current medication list in Epic reviewed, no relevant/significant DDIs with [name] identified.   Patient will take Lynparza 150mg  tablets, 2 tablets (300mg ) by mouth 2 times daily without regard to food.  Patient counseled that administering with food may increase tolerability.  Patient knows to avoid grapefruit or grapefruit juice while on therapy with Lynparza.  Nathan Berg start date: 11/22/2022   Adverse effects include but are not limited to: nausea, vomiting, diarrhea, taste changes, mouth sores, fatigue, constipation, decreased blood counts, and joint pain. Patient informed that pneumonitis (including some fatalities) has occurred rarely. Myelodysplastic syndrome/acute myeloid leukemia (MDS/AML) have been reported (rarely) in clinical trials.  Patient has anti-emetic on hand and knows to take it if nausea develops.   Patient will obtain anti diarrheal and alert the office of 4 or more loose stools above baseline.  Reviewed with patient importance of keeping a medication schedule and plan for any missed doses. No barriers to medication adherence identified.  Medication reconciliation performed and medication/allergy list updated.  Insurance authorization for Nathan Berg has been obtained. Test claim at the pharmacy revealed copayment $0 for 1st fill of 30 days. This will ship from the [facility] outpatient pharmacy on 11/19/22 to deliver to patient's home on 11/22/22.  Patient informed the pharmacy will reach out 5-7 days prior to needing next fill of  Lynparza to coordinate continued medication acquisition to prevent break in therapy.  All questions answered.  Patient voiced understanding and appreciation.   Medication education handout placed in mail for patient. Patient knows to call the office with questions or concerns. Oral Chemotherapy Clinic phone number provided to patient.   Nathan Berg, PharmD Hematology/Oncology Clinical Pharmacist [facility] Oral Chemotherapy Navigation [facility] [phone] 11/19/2022   10:13 AM Oral Oncology Clinic [phone]

## 2022-12-09 ENCOUNTER — Encounter (HOSPITAL_COMMUNITY)
Admission: RE | Admit: 2022-12-09 | Discharge: 2022-12-09 | Disposition: A | Discharge status: Home / Self Care | Payer: Medicare Other | Source: Ambulatory Visit | Attending: Hematology | Admitting: Hematology

## 2022-12-09 ENCOUNTER — Other Ambulatory Visit (HOSPITAL_COMMUNITY): Payer: Self-pay

## 2022-12-09 DIAGNOSIS — C61 Malignant neoplasm of prostate: Secondary | ICD-10-CM | POA: Diagnosis present

## 2022-12-09 MED ORDER — PIFLIFOLASTAT F 18 (PYLARIFY) INJECTION
9.0000 | Freq: Once | INTRAVENOUS | Status: AC
  Administered 2022-12-09: 8.13 via INTRAVENOUS

## 2022-12-13 ENCOUNTER — Other Ambulatory Visit: Payer: Self-pay

## 2022-12-15 ENCOUNTER — Other Ambulatory Visit: Payer: Self-pay

## 2022-12-15 ENCOUNTER — Other Ambulatory Visit: Payer: Self-pay | Admitting: Hematology

## 2022-12-15 ENCOUNTER — Other Ambulatory Visit (HOSPITAL_COMMUNITY): Payer: Self-pay

## 2022-12-15 MED ORDER — OLAPARIB 150 MG PO TABS
300.0000 mg | ORAL_TABLET | Freq: Two times a day (BID) | ORAL | 0 refills | Status: DC
  Filled 2022-12-15: qty 120, 30d supply, fill #0

## 2022-12-20 ENCOUNTER — Other Ambulatory Visit: Payer: Self-pay

## 2022-12-20 ENCOUNTER — Inpatient Hospital Stay: Payer: Medicare Other | Attending: Oncology

## 2022-12-20 DIAGNOSIS — C7951 Secondary malignant neoplasm of bone: Secondary | ICD-10-CM | POA: Diagnosis present

## 2022-12-20 DIAGNOSIS — Z1509 Genetic susceptibility to other malignant neoplasm: Secondary | ICD-10-CM | POA: Insufficient documentation

## 2022-12-20 DIAGNOSIS — C61 Malignant neoplasm of prostate: Secondary | ICD-10-CM | POA: Diagnosis present

## 2022-12-20 LAB — CBC WITH DIFFERENTIAL (CANCER CENTER ONLY)
Abs Immature Granulocytes: 0.02 10*3/uL (ref 0.00–0.07)
Basophils Absolute: 0 10*3/uL (ref 0.0–0.1)
Basophils Relative: 1 %
Eosinophils Absolute: 0.1 10*3/uL (ref 0.0–0.5)
Eosinophils Relative: 2 %
HCT: 38.8 % — ABNORMAL LOW (ref 39.0–52.0)
Hemoglobin: 12.9 g/dL — ABNORMAL LOW (ref 13.0–17.0)
Immature Granulocytes: 1 %
Lymphocytes Relative: 16 %
Lymphs Abs: 0.7 10*3/uL (ref 0.7–4.0)
MCH: 31.1 pg (ref 26.0–34.0)
MCHC: 33.2 g/dL (ref 30.0–36.0)
MCV: 93.5 fL (ref 80.0–100.0)
Monocytes Absolute: 0.5 10*3/uL (ref 0.1–1.0)
Monocytes Relative: 12 %
Neutro Abs: 2.8 10*3/uL (ref 1.7–7.7)
Neutrophils Relative %: 68 %
Platelet Count: 227 10*3/uL (ref 150–400)
RBC: 4.15 MIL/uL — ABNORMAL LOW (ref 4.22–5.81)
RDW: 14.4 % (ref 11.5–15.5)
WBC Count: 4.1 10*3/uL (ref 4.0–10.5)
nRBC: 0 % (ref 0.0–0.2)

## 2022-12-20 LAB — CMP (CANCER CENTER ONLY)
ALT: 13 U/L (ref 0–44)
AST: 17 U/L (ref 15–41)
Albumin: 3.5 g/dL (ref 3.5–5.0)
Alkaline Phosphatase: 103 U/L (ref 38–126)
Anion gap: 5 (ref 5–15)
BUN: 11 mg/dL (ref 8–23)
CO2: 33 mmol/L — ABNORMAL HIGH (ref 22–32)
Calcium: 9.1 mg/dL (ref 8.9–10.3)
Chloride: 104 mmol/L (ref 98–111)
Creatinine: 0.96 mg/dL (ref 0.61–1.24)
GFR, Estimated: >60 mL/min (ref >60)
Glucose, Bld: 107 mg/dL — ABNORMAL HIGH (ref 70–99)
Potassium: 5.1 mmol/L (ref 3.5–5.1)
Sodium: 142 mmol/L (ref 135–145)
Total Bilirubin: 0.4 mg/dL (ref 0.3–1.2)
Total Protein: 5.9 g/dL — ABNORMAL LOW (ref 6.5–8.1)

## 2022-12-22 LAB — PROSTATE-SPECIFIC AG, SERUM (LABCORP): Prostate Specific Ag, Serum: 19.7 ng/mL — ABNORMAL HIGH (ref 0.0–4.0)

## 2022-12-22 LAB — TESTOSTERONE: Testosterone: 14 ng/dL — ABNORMAL LOW (ref 264–916)

## 2022-12-23 ENCOUNTER — Inpatient Hospital Stay: Payer: Medicare Other | Admitting: Hematology

## 2022-12-23 ENCOUNTER — Encounter: Payer: Self-pay | Admitting: Hematology

## 2022-12-23 ENCOUNTER — Other Ambulatory Visit (HOSPITAL_COMMUNITY): Payer: Self-pay

## 2022-12-23 ENCOUNTER — Other Ambulatory Visit: Payer: Self-pay

## 2022-12-23 VITALS — BP 110/79 | HR 80 | Temp 98.4°F | Resp 16 | Ht 71.0 in | Wt 184.2 lb

## 2022-12-23 DIAGNOSIS — C61 Malignant neoplasm of prostate: Secondary | ICD-10-CM | POA: Diagnosis not present

## 2022-12-23 MED ORDER — OLAPARIB 150 MG PO TABS
300.0000 mg | ORAL_TABLET | Freq: Two times a day (BID) | ORAL | 3 refills | Status: DC
  Filled 2022-12-23 – 2023-01-18 (×3): qty 120, 30d supply, fill #0

## 2022-12-23 NOTE — Assessment & Plan Note (Signed)
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   -s/p robotic assisted laparoscopic radical prostatectomy and bilateral lymph node dissection in March 2015, pT2N0 -He subsequently received salvage radiation under the care of Dr. Kathrynn Running after receiving 38.4 Wallace Cullens in 38 fractions between May 06, 2014 and June 26, 2014.  He has received 6 months of androgen deprivation utilizing Lupron.  -He developed metastasis based on pet imaging on September 16, 2020.  -Zytiga 1000 mg with prednisone given in July 2022 to 09/2020 -he has been on  Xtandi 160 mg daily started in August 2022, and Eligard as needed. His PSA level has been slowing trending up  -he finished a course of RT on 05/07/2022. His repeated PSA was 13.9 on 08/11/2022,which is similar to 3 months ago.  -due to his worsening body pain, and fatigue, I recommended him chaging to PARP inhibitor Lynparza. Unfortunately he has unaffordable high co-pay, and does not qualify for financial assistance -We discussed chemo option, such as docetaxel. Will hold it for now.  -Due to side effect, he has self reduced Xtandi and dose to 120 mg daily, his recent PSA has slightly increased to 18.7 (13-14 in past 9 months).  -he now has a prostate cancer grant available, so I have changed his Xtandi to [name]. -his PSMA scan from 12/17/2022 showed mixed response, his T3 bone metastasis has improved, but the bone mets in right iliac wing is worse, other bone metastasis are stable, no other new metastasis.

## 2022-12-23 NOTE — Progress Notes (Signed)
[facility]   [phone] Fax:([phone]   Clinic Follow up Note   Patient Care Team: Malachy Mood, MD as PCP - General (Hematology) Malachy Mood, MD as Attending Physician (Hematology and Oncology) Barbie Banner, MD (Family Medicine)  Date of Service:  12/23/2022  CHIEF COMPLAINT: f/u of Prostate Cancer    CURRENT THERAPY:  Lynparza 150 mg  2 times daily starting 12/2022   Plan to start Zometa every 3 months after dental clearance  ASSESSMENT:  Nathan Berg is a 67 y.o. 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   -s/p robotic assisted laparoscopic radical prostatectomy and bilateral lymph node dissection in March 2015, pT2N0 -He subsequently received salvage radiation under the care of Dr. Kathrynn Running after receiving 38.4 Wallace Cullens in 38 fractions between May 06, 2014 and June 26, 2014.  He has received 6 months of androgen deprivation utilizing Lupron.  -He developed metastasis based on pet imaging on September 16, 2020.  -Zytiga 1000 mg with prednisone given in July 2022 to 09/2020 -he has been on  Xtandi 160 mg daily started in August 2022, and Eligard as needed. His PSA level has been slowing trending up  -he finished a course of RT on 05/07/2022. His repeated PSA was 13.9 on 08/11/2022,which is similar to 3 months ago.  -due to his worsening body pain, and fatigue, I recommended him chaging to PARP inhibitor Lynparza. Unfortunately he has unaffordable high co-pay, and does not qualify for financial assistance -We discussed chemo option, such as docetaxel. Will hold it for now.  -Due to side effect, he has self reduced Xtandi and dose to 120 mg daily, his recent PSA has slightly increased to 18.7 (13-14 in past 9 months).  -he now has a prostate cancer grant available, so I have changed his Xtandi to [name]. -his PSMA scan from  12/17/2022 showed mixed response, his T3 bone metastasis has improved, but the bone mets in right iliac wing is worse, other bone metastasis are stable, no other new metastasis. -Will continue Lynparza, he is overall tolerating well with moderate fatigue, able to function well at home.  Pain is overall stable.    PLAN: -lab reviewed -I reviewed his PSMA images  -I discuss Lynparza side effects w/ pt, will continue  -I refill Lynparza  - I recommend restarting Eligard injection if testosterone level rises significantly -Lab in 1 months, lab and follow-up in 2 months    SUMMARY OF ONCOLOGIC HISTORY: Oncology History Overview Note   Cancer Staging  Malignant neoplasm of prostate (HCC) Staging form: Prostate, AJCC 7th Edition - Clinical: Stage IV (TX, NX, M1, PSA: Less than 10, Gleason 8-10) - Signed by Benjiman Core, MD on 04/30/2021     Malignant neoplasm of prostate (HCC)  09/19/2013 Initial Diagnosis   Malignant neoplasm of prostate (HCC)   04/30/2021 Cancer Staging   Staging form: Prostate, AJCC 7th Edition - Clinical: Stage IV (TX, NX, M1, PSA: Less than 10, Gleason 8-10) - Signed by Benjiman Core, MD on 04/30/2021   03/11/2022 Imaging    IMPRESSION: 1. Multifocal intense radiotracer avid skeletal metastasis again noted involving the axillary skeleton. No change in pattern or number of lesions. No new lesions identified. 2. The radiotracer activity of the lesions; however, is uniformly increased from comparison exam. 3. The sclerotic pattern of the  lesions on comparison CT is a similar. One lesion at T3 as new lytic component. 4. No visceral metastasis.  No new nodal disease.     12/09/2022 PET scan    IMPRESSION: 1. 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 2. Metastatic skeletal lesion in the RIGHT iliac wing is increased in size compared to prior. 3. Other than the 2 above lesions, the pattern  and intensity of multifocal radiotracer avid skeletal metastasis is unchanged. No new lesions identified. 4. No evidence of nodal metastasis or visceral metastasis.        INTERVAL HISTORY:  Nathan Berg is here for a follow up of Prostate Cancer . He was last seen by me on 11/18/2022. He presents to the clinic alone. Pt has started his Lynparza, but he states that he is still having the same side effects. Pt state that he has some dizziness. He has not falling for from being dizzy . Pt state that when he feels dizzy he takes a sit.    All other systems were reviewed with the patient and are negative.  MEDICAL HISTORY:  Past Medical History:  Diagnosis Date   Arthritis    Cancer (HCC)    dx. 2 months ago- Prostate cancer   GERD (gastroesophageal reflux disease)    H/O seasonal allergies    History of kidney stones    x1   Prostate cancer (HCC)     SURGICAL HISTORY: Past Surgical History:  Procedure Laterality Date   CHEST TUBE INSERTION  07/16/2019   trauma    COLONOSCOPY     HERNIA REPAIR Bilateral    20 yrs ago   LYMPHADENECTOMY Bilateral 09/19/2013   Procedure: LYMPHADENECTOMY  PELVIC LYMPH NODE DISSECTION;  Surgeon: Valetta Fuller, MD;  Location: [facility];  Service: Urology;  Laterality: Bilateral;   PROSTATE BIOPSY     ROBOT ASSISTED LAPAROSCOPIC RADICAL PROSTATECTOMY N/A 09/19/2013   Procedure: ROBOTIC ASSISTED LAPAROSCOPIC RADICAL PROSTATECTOMY;  Surgeon: Valetta Fuller, MD;  Location: [facility];  Service: Urology;  Laterality: N/A;   TONSILLECTOMY     child   VARICOCELE EXCISION      I have reviewed the social history and family history with the patient and they are unchanged from previous note.  ALLERGIES:  is allergic to codeine and penicillin g.  MEDICATIONS:  Current Outpatient Medications  Medication Sig Dispense Refill   acetaminophen (TYLENOL) 500 MG tablet Take 2 tablets (1,000 mg total) by mouth 4 (four) times daily. 120 tablet 3   ALPRAZolam (XANAX) 1  MG tablet TAKE 1/2 TO 1 TABLET BY MOUTH TWICE DAILY AS NEEDED FOR ANXIETY. MUST HAVE OFFICE OR TELEMEDICINE VISIT FOR REFILLS 45 tablet 0   buPROPion (WELLBUTRIN SR) 200 MG 12 hr tablet Take 200 mg by mouth 2 (two) times daily.     celecoxib (CELEBREX) 200 MG capsule Take 1 capsule (200 mg total) by mouth daily in the afternoon. 30 capsule 2   docusate sodium (COLACE) 100 MG capsule Take 1 capsule (100 mg total) by mouth 2 (two) times daily. 60 capsule 2   escitalopram (LEXAPRO) 20 MG tablet Take by mouth.     gabapentin (NEURONTIN) 300 MG capsule Take 2 in the morning, 1 in the afternoon 1 at nighttime 120 capsule 3   ibuprofen (ADVIL) 600 MG tablet Take 1 tablet (600 mg total) by mouth 4 (four) times daily. 120 tablet 1   lidocaine (LIDODERM) 5 % Place 1  patch onto the skin daily. Remove & Discard patch within 12 hours or as directed by MD 30 patch 0   methocarbamol (ROBAXIN-750) 750 MG tablet Take 1 tablet (750 mg total) by mouth 4 (four) times daily. 120 tablet 1   olaparib (LYNPARZA) 150 MG tablet Take 2 tablets (300 mg total) by mouth 2 (two) times daily. Swallow whole. May take with food to decrease nausea and vomiting. 120 tablet 3   ondansetron (ZOFRAN) 8 MG tablet Take 1 tablet (8 mg total) by mouth every 8 (eight) hours as needed for nausea or vomiting. 20 tablet 1   QUEtiapine (SEROQUEL) 25 MG tablet Take 50 mg by mouth 2 (two) times daily.     traMADol (ULTRAM) 50 MG tablet Take 1 tablet (50 mg total) by mouth every 6 (six) hours as needed (take one tablet 30 minutes prior to radiation treatments and every 6 hours as needed thereafter). 30 tablet 1   traZODone (DESYREL) 100 MG tablet TAKE DAILY AS DIRECTED FOR INSOMNIA (Patient not taking: Reported on 04/01/2020)     traZODone (DESYREL) 25 mg TABS tablet Take 25 mg by mouth at bedtime.     No current facility-administered medications for this visit.    PHYSICAL EXAMINATION: ECOG PERFORMANCE STATUS: 1 - Symptomatic but completely  ambulatory  Vitals:   12/23/22 0952  BP: 110/79  Pulse: 80  Resp: 16  Temp: 98.4 F (36.9 C)  SpO2: 96%   Wt Readings from Last 3 Encounters:  12/23/22 184 lb 3.2 oz (83.6 kg)  11/18/22 184 lb 4.8 oz (83.6 kg)  10/22/22 185 lb (83.9 kg)     GENERAL:alert, no distress and comfortable SKIN: skin color normal, no rashes or significant lesions EYES: normal, Conjunctiva are pink and non-injected, sclera clear  NEURO: alert & oriented x 3 with fluent speech  LABORATORY DATA:  I have reviewed the data as listed    Latest Ref Rng & Units 12/20/2022    9:30 AM 11/16/2022    9:51 AM 10/22/2022    7:32 PM  CBC  WBC 4.0 - 10.5 K/uL 4.1  4.4    Hemoglobin 13.0 - 17.0 g/dL 46.9  62.9  52.8   Hematocrit 39.0 - 52.0 % 38.8  37.5  36.0   Platelets 150 - 400 K/uL 227  254          Latest Ref Rng & Units 12/20/2022    9:30 AM 11/16/2022    9:51 AM 10/22/2022    7:32 PM  CMP  Glucose 70 - 99 mg/dL 413  91  244   BUN 8 - 23 mg/dL 11  16  14    Creatinine 0.61 - 1.24 mg/dL 0.10  2.72  5.36   Sodium 135 - 145 mmol/L 142  139  139   Potassium 3.5 - 5.1 mmol/L 5.1  4.4  3.9   Chloride 98 - 111 mmol/L 104  105  103   CO2 22 - 32 mmol/L 33  28    Calcium 8.9 - 10.3 mg/dL 9.1  8.5    Total Protein 6.5 - 8.1 g/dL 5.9  6.4    Total Bilirubin 0.3 - 1.2 mg/dL 0.4  0.4    Alkaline Phos 38 - 126 U/L 103  102    AST 15 - 41 U/L 17  17    ALT 0 - 44 U/L 13  12        RADIOGRAPHIC STUDIES: I have personally reviewed the radiological images as listed and  agreed with the findings in the report. No results found.    No orders of the defined types were placed in this encounter.  All questions were answered. The patient knows to call the clinic with any problems, questions or concerns. No barriers to learning was detected. The total time spent in the appointment was 25 minutes.     Malachy Mood, MD 12/23/2022   Carolin Coy, CMA, am acting as scribe for Malachy Mood, MD.   I have reviewed  the above documentation for accuracy and completeness, and I agree with the above.

## 2023-01-12 ENCOUNTER — Inpatient Hospital Stay: Payer: Medicare Other | Attending: Oncology

## 2023-01-12 ENCOUNTER — Other Ambulatory Visit: Payer: Self-pay

## 2023-01-12 DIAGNOSIS — C7951 Secondary malignant neoplasm of bone: Secondary | ICD-10-CM | POA: Diagnosis present

## 2023-01-12 DIAGNOSIS — C61 Malignant neoplasm of prostate: Secondary | ICD-10-CM | POA: Diagnosis present

## 2023-01-12 LAB — CMP (CANCER CENTER ONLY)
ALT: 12 U/L (ref 0–44)
AST: 15 U/L (ref 15–41)
Albumin: 3.5 g/dL (ref 3.5–5.0)
Alkaline Phosphatase: 92 U/L (ref 38–126)
Anion gap: 4 — ABNORMAL LOW (ref 5–15)
BUN: 16 mg/dL (ref 8–23)
CO2: 32 mmol/L (ref 22–32)
Calcium: 8.9 mg/dL (ref 8.9–10.3)
Chloride: 105 mmol/L (ref 98–111)
Creatinine: 1.01 mg/dL (ref 0.61–1.24)
GFR, Estimated: >60 mL/min (ref >60)
Glucose, Bld: 89 mg/dL (ref 70–99)
Potassium: 4.4 mmol/L (ref 3.5–5.1)
Sodium: 141 mmol/L (ref 135–145)
Total Bilirubin: 0.4 mg/dL (ref 0.3–1.2)
Total Protein: 6.2 g/dL — ABNORMAL LOW (ref 6.5–8.1)

## 2023-01-12 LAB — CBC WITH DIFFERENTIAL (CANCER CENTER ONLY)
Abs Immature Granulocytes: 0.02 10*3/uL (ref 0.00–0.07)
Basophils Absolute: 0 10*3/uL (ref 0.0–0.1)
Basophils Relative: 1 %
Eosinophils Absolute: 0.1 10*3/uL (ref 0.0–0.5)
Eosinophils Relative: 1 %
HCT: 36.3 % — ABNORMAL LOW (ref 39.0–52.0)
Hemoglobin: 12 g/dL — ABNORMAL LOW (ref 13.0–17.0)
Immature Granulocytes: 1 %
Lymphocytes Relative: 19 %
Lymphs Abs: 0.7 10*3/uL (ref 0.7–4.0)
MCH: 31.5 pg (ref 26.0–34.0)
MCHC: 33.1 g/dL (ref 30.0–36.0)
MCV: 95.3 fL (ref 80.0–100.0)
Monocytes Absolute: 0.4 10*3/uL (ref 0.1–1.0)
Monocytes Relative: 10 %
Neutro Abs: 2.5 10*3/uL (ref 1.7–7.7)
Neutrophils Relative %: 68 %
Platelet Count: 240 10*3/uL (ref 150–400)
RBC: 3.81 MIL/uL — ABNORMAL LOW (ref 4.22–5.81)
RDW: 15.8 % — ABNORMAL HIGH (ref 11.5–15.5)
WBC Count: 3.7 10*3/uL — ABNORMAL LOW (ref 4.0–10.5)
nRBC: 0 % (ref 0.0–0.2)

## 2023-01-13 LAB — TESTOSTERONE: Testosterone: 15 ng/dL — ABNORMAL LOW (ref 264–916)

## 2023-01-13 LAB — PROSTATE-SPECIFIC AG, SERUM (LABCORP): Prostate Specific Ag, Serum: 23 ng/mL — ABNORMAL HIGH (ref 0.0–4.0)

## 2023-01-14 ENCOUNTER — Other Ambulatory Visit (HOSPITAL_COMMUNITY): Payer: Self-pay

## 2023-01-17 ENCOUNTER — Encounter (HOSPITAL_COMMUNITY): Payer: Self-pay

## 2023-01-17 ENCOUNTER — Other Ambulatory Visit (HOSPITAL_COMMUNITY): Payer: Self-pay

## 2023-01-18 ENCOUNTER — Other Ambulatory Visit: Payer: Self-pay

## 2023-01-18 ENCOUNTER — Other Ambulatory Visit (HOSPITAL_COMMUNITY): Payer: Self-pay

## 2023-01-19 ENCOUNTER — Other Ambulatory Visit (HOSPITAL_COMMUNITY): Payer: Self-pay

## 2023-01-19 ENCOUNTER — Other Ambulatory Visit: Payer: Self-pay

## 2023-01-21 ENCOUNTER — Encounter: Payer: Self-pay | Admitting: Hematology

## 2023-01-27 ENCOUNTER — Telehealth: Payer: Self-pay

## 2023-01-27 NOTE — Telephone Encounter (Signed)
Faxed signed Dental Clearance to [facility] as per Dr. Mosetta Putt. Received fax confirmation of receipt. Placed original in the to be scanned file.

## 2023-02-08 ENCOUNTER — Other Ambulatory Visit (HOSPITAL_COMMUNITY): Payer: Self-pay

## 2023-02-10 ENCOUNTER — Telehealth: Payer: Self-pay | Admitting: Hematology

## 2023-02-11 ENCOUNTER — Other Ambulatory Visit (HOSPITAL_COMMUNITY): Payer: Self-pay

## 2023-02-14 ENCOUNTER — Encounter (HOSPITAL_COMMUNITY): Payer: Self-pay

## 2023-02-14 ENCOUNTER — Other Ambulatory Visit (HOSPITAL_COMMUNITY): Payer: Self-pay

## 2023-02-15 ENCOUNTER — Other Ambulatory Visit: Payer: Self-pay

## 2023-02-15 ENCOUNTER — Inpatient Hospital Stay: Payer: Medicare Other | Attending: Oncology

## 2023-02-15 DIAGNOSIS — Z1509 Genetic susceptibility to other malignant neoplasm: Secondary | ICD-10-CM | POA: Insufficient documentation

## 2023-02-15 DIAGNOSIS — C61 Malignant neoplasm of prostate: Secondary | ICD-10-CM | POA: Diagnosis present

## 2023-02-15 DIAGNOSIS — R9721 Rising PSA following treatment for malignant neoplasm of prostate: Secondary | ICD-10-CM | POA: Diagnosis not present

## 2023-02-15 DIAGNOSIS — C7951 Secondary malignant neoplasm of bone: Secondary | ICD-10-CM | POA: Diagnosis present

## 2023-02-15 LAB — CBC WITH DIFFERENTIAL (CANCER CENTER ONLY)
Abs Immature Granulocytes: 0.02 10*3/uL (ref 0.00–0.07)
Basophils Absolute: 0 10*3/uL (ref 0.0–0.1)
Basophils Relative: 1 %
Eosinophils Absolute: 0.1 10*3/uL (ref 0.0–0.5)
Eosinophils Relative: 3 %
HCT: 34.4 % — ABNORMAL LOW (ref 39.0–52.0)
Hemoglobin: 11.8 g/dL — ABNORMAL LOW (ref 13.0–17.0)
Immature Granulocytes: 1 %
Lymphocytes Relative: 14 %
Lymphs Abs: 0.6 10*3/uL — ABNORMAL LOW (ref 0.7–4.0)
MCH: 33.2 pg (ref 26.0–34.0)
MCHC: 34.3 g/dL (ref 30.0–36.0)
MCV: 96.9 fL (ref 80.0–100.0)
Monocytes Absolute: 0.4 10*3/uL (ref 0.1–1.0)
Monocytes Relative: 11 %
Neutro Abs: 3 10*3/uL (ref 1.7–7.7)
Neutrophils Relative %: 70 %
Platelet Count: 259 10*3/uL (ref 150–400)
RBC: 3.55 MIL/uL — ABNORMAL LOW (ref 4.22–5.81)
RDW: 16.4 % — ABNORMAL HIGH (ref 11.5–15.5)
WBC Count: 4.2 10*3/uL (ref 4.0–10.5)
nRBC: 0.5 % — ABNORMAL HIGH (ref 0.0–0.2)

## 2023-02-15 LAB — CMP (CANCER CENTER ONLY)
ALT: 16 U/L (ref 0–44)
AST: 15 U/L (ref 15–41)
Albumin: 3.7 g/dL (ref 3.5–5.0)
Alkaline Phosphatase: 96 U/L (ref 38–126)
Anion gap: 5 (ref 5–15)
BUN: 12 mg/dL (ref 8–23)
CO2: 28 mmol/L (ref 22–32)
Calcium: 8.4 mg/dL — ABNORMAL LOW (ref 8.9–10.3)
Chloride: 105 mmol/L (ref 98–111)
Creatinine: 0.87 mg/dL (ref 0.61–1.24)
GFR, Estimated: >60 mL/min (ref >60)
Glucose, Bld: 98 mg/dL (ref 70–99)
Potassium: 4.4 mmol/L (ref 3.5–5.1)
Sodium: 138 mmol/L (ref 135–145)
Total Bilirubin: 0.4 mg/dL (ref 0.3–1.2)
Total Protein: 6.3 g/dL — ABNORMAL LOW (ref 6.5–8.1)

## 2023-02-16 ENCOUNTER — Encounter: Payer: Self-pay | Admitting: Hematology

## 2023-02-16 ENCOUNTER — Telehealth: Payer: Self-pay | Admitting: Pharmacy Technician

## 2023-02-16 ENCOUNTER — Inpatient Hospital Stay (HOSPITAL_BASED_OUTPATIENT_CLINIC_OR_DEPARTMENT_OTHER): Payer: Medicare Other | Admitting: Hematology

## 2023-02-16 ENCOUNTER — Inpatient Hospital Stay: Payer: Medicare Other | Attending: Oncology

## 2023-02-16 ENCOUNTER — Other Ambulatory Visit: Payer: Self-pay

## 2023-02-16 ENCOUNTER — Other Ambulatory Visit (HOSPITAL_COMMUNITY): Payer: Self-pay

## 2023-02-16 VITALS — BP 115/74 | HR 69 | Temp 98.4°F | Resp 18 | Wt 182.1 lb

## 2023-02-16 DIAGNOSIS — C61 Malignant neoplasm of prostate: Secondary | ICD-10-CM | POA: Diagnosis not present

## 2023-02-16 DIAGNOSIS — C7951 Secondary malignant neoplasm of bone: Secondary | ICD-10-CM

## 2023-02-16 MED ORDER — ENZALUTAMIDE 80 MG PO TABS
160.0000 mg | ORAL_TABLET | Freq: Every day | ORAL | 0 refills | Status: DC
  Filled 2023-02-16 (×2): qty 60, 30d supply, fill #0

## 2023-02-16 NOTE — Assessment & Plan Note (Addendum)
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   -s/p robotic assisted laparoscopic radical prostatectomy and bilateral lymph node dissection in March 2015, pT2N0 -He subsequently received salvage radiation under the care of Dr. Kathrynn Running after receiving 38.4 Wallace Cullens in 38 fractions between May 06, 2014 and June 26, 2014.  He has received 6 months of androgen deprivation utilizing Lupron.  -He developed metastasis based on pet imaging on September 16, 2020.  -Zytiga 1000 mg with prednisone given in July 2022 to 09/2020 -he has been on  Xtandi 160 mg daily started in August 2022, and Eligard as needed. His PSA level has been slowing trending up  -he finished a course of RT on 05/07/2022. His repeated PSA was 13.9 on 08/11/2022,which is similar to 3 months ago.  -due to his worsening body pain, and fatigue, I recommended him chaging to PARP inhibitor Lynparza. Unfortunately he has unaffordable high co-pay, and does not qualify for financial assistance -We discussed chemo option, such as docetaxel. Will hold it for now.  -Due to side effect, he has self reduced Xtandi and dose to 120 mg daily, his recent PSA has slightly increased to 18.7 (13-14 in past 9 months).  -he now has a prostate cancer grant available, so I have changed his Xtandi to [name]. -his PSMA scan from 12/17/2022 showed mixed response, his T3 bone metastasis has improved, but the bone mets in right iliac wing is worse, other bone metastasis are stable, no other new metastasis. -He has been on Lynparza for 2 months now, overall tolerated moderately well, similar to previous Xtandi with moderate fatigue.  Repeated PSA yesterday however increased to 34, it has been trending up since he started [name].  His back pain is unchanged, no other new pains. -Will change Lynparza back to [place], will check again to see if he has grant for [employer] or co-pay assistance.

## 2023-02-16 NOTE — Progress Notes (Signed)
[facility]   [phone] [phone]   Clinic Follow up Note   Patient Care Team: Malachy Mood, MD as PCP - General (Hematology) Malachy Mood, MD as Attending Physician (Hematology and Oncology) Barbie Banner, MD (Family Medicine)  Date of Service:  02/16/2023  CHIEF COMPLAINT: f/u of Prostate Cancer   CURRENT THERAPY:  Lynparza 150 mg  2 times daily starting 12/2022   Leuprolid (Lupron) q4 months and Xgeva q28d  ASSESSMENT:  Nathan Berg is a 67 y.o. 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   -s/p robotic assisted laparoscopic radical prostatectomy and bilateral lymph node dissection in March 2015, pT2N0 -He subsequently received salvage radiation under the care of Dr. Kathrynn Running after receiving 38.4 Wallace Cullens in 38 fractions between May 06, 2014 and June 26, 2014.  He has received 6 months of androgen deprivation utilizing Lupron.  -He developed metastasis based on pet imaging on September 16, 2020.  -Zytiga 1000 mg with prednisone given in July 2022 to 09/2020 -he has been on  Xtandi 160 mg daily started in August 2022, and Eligard as needed. His PSA level has been slowing trending up  -he finished a course of RT on 05/07/2022. His repeated PSA was 13.9 on 08/11/2022,which is similar to 3 months ago.  -due to his worsening body pain, and fatigue, I recommended him chaging to PARP inhibitor Lynparza. Unfortunately he has unaffordable high co-pay, and does not qualify for financial assistance -We discussed chemo option, such as docetaxel. Will hold it for now.  -Due to side effect, he has self reduced Xtandi and dose to 120 mg daily, his recent PSA has slightly increased to 18.7 (13-14 in past 9 months).  -he now has a prostate cancer grant available, so I have changed his Xtandi to [name]. -his PSMA scan from 12/17/2022 showed  mixed response, his T3 bone metastasis has improved, but the bone mets in right iliac wing is worse, other bone metastasis are stable, no other new metastasis. -He has been on Lynparza for 2 months now, overall tolerated moderately well, similar to previous Xtandi with moderate fatigue.  Repeated PSA yesterday however increased to 34, it has been trending up since he started [name].  His back pain is unchanged, no other new pains. -Will change Lynparza back to [place], will check again to see if he has grant for [employer] or co-pay assistance.  Metastasis to bone [facility]) -He has not had any bone medications -Recent PET scan showed a diffuse bone metastasis. -I discussed the benefit and potential side effect of biphosphonate and Xgeva, he is open to treatment.  He just had teeth extractions 3 weeks ago, will plan to start on next visit in 2 months.  For his convenience, will offer Xgeva every 2 months.     PLAN: -lab reviewed -PSA-34, increasing significantly  -discuss Xgeva and its benefit and side effects - Pt agree to [place], will start in 2 months  -Will change Lynparza back to [place], I will reach out to [place] for Kennedy Bucker of co-pay assistance -lab and f/u in 2 months, and start Xgeva injection on next visit   SUMMARY OF ONCOLOGIC HISTORY: Oncology History Overview Note   Cancer Staging  Malignant neoplasm of prostate (HCC) Staging form: Prostate, AJCC 7th Edition - Clinical: Stage IV (TX, NX, M1, PSA: Less than 10,  Gleason 8-10) - Signed by Benjiman Core, MD on 04/30/2021     Malignant neoplasm of prostate (HCC)  09/19/2013 Initial Diagnosis   Malignant neoplasm of prostate (HCC)   04/30/2021 Cancer Staging   Staging form: Prostate, AJCC 7th Edition - Clinical: Stage IV (TX, NX, M1, PSA: Less than 10, Gleason 8-10) - Signed by Benjiman Core, MD on 04/30/2021   03/11/2022 Imaging    IMPRESSION: 1. Multifocal intense radiotracer avid skeletal metastasis again noted involving  the axillary skeleton. No change in pattern or number of lesions. No new lesions identified. 2. The radiotracer activity of the lesions; however, is uniformly increased from comparison exam. 3. The sclerotic pattern of the lesions on comparison CT is a similar. One lesion at T3 as new lytic component. 4. No visceral metastasis.  No new nodal disease.     12/09/2022 PET scan    IMPRESSION: 1. 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 2. Metastatic skeletal lesion in the RIGHT iliac wing is increased in size compared to prior. 3. Other than the 2 above lesions, the pattern and intensity of multifocal radiotracer avid skeletal metastasis is unchanged. No new lesions identified. 4. No evidence of nodal metastasis or visceral metastasis.        INTERVAL HISTORY:  Nathan Berg is here for a follow up of Prostate Cancer. He was last seen by me on. He presents to the clinic alone. Pt state that he feels no different since starting [name]. Pt state that he has back pain and also has a know where the pain is located. Pt rate that pain at a 5-6. Pt state that the pain  is intermittent.     All other systems were reviewed with the patient and are negative.  MEDICAL HISTORY:  Past Medical History:  Diagnosis Date   Arthritis    Cancer (HCC)    dx. 2 months ago- Prostate cancer   GERD (gastroesophageal reflux disease)    H/O seasonal allergies    History of kidney stones    x1   Prostate cancer (HCC)     SURGICAL HISTORY: Past Surgical History:  Procedure Laterality Date   CHEST TUBE INSERTION  07/16/2019   trauma    COLONOSCOPY     HERNIA REPAIR Bilateral    20 yrs ago   LYMPHADENECTOMY Bilateral 09/19/2013   Procedure: LYMPHADENECTOMY  PELVIC LYMPH NODE DISSECTION;  Surgeon: Valetta Fuller, MD;  Location: [facility];  Service: Urology;  Laterality: Bilateral;   PROSTATE BIOPSY     ROBOT ASSISTED LAPAROSCOPIC  RADICAL PROSTATECTOMY N/A 09/19/2013   Procedure: ROBOTIC ASSISTED LAPAROSCOPIC RADICAL PROSTATECTOMY;  Surgeon: Valetta Fuller, MD;  Location: [facility];  Service: Urology;  Laterality: N/A;   TONSILLECTOMY     child   VARICOCELE EXCISION      I have reviewed the social history and family history with the patient and they are unchanged from previous note.  ALLERGIES:  is allergic to codeine and penicillin g.  MEDICATIONS:  Current Outpatient Medications  Medication Sig Dispense Refill   enzalutamide (XTANDI) 80 MG tablet Take 2 tablets (160 mg total) by mouth daily. 60 tablet 0   acetaminophen (TYLENOL) 500 MG tablet Take 2 tablets (1,000 mg total) by mouth 4 (four) times daily. 120 tablet 3   ALPRAZolam (XANAX) 1 MG tablet TAKE 1/2 TO 1 TABLET BY MOUTH TWICE DAILY AS NEEDED FOR ANXIETY. MUST HAVE OFFICE OR TELEMEDICINE VISIT  FOR REFILLS 45 tablet 0   buPROPion (WELLBUTRIN SR) 200 MG 12 hr tablet Take 200 mg by mouth 2 (two) times daily.     celecoxib (CELEBREX) 200 MG capsule Take 1 capsule (200 mg total) by mouth daily in the afternoon. 30 capsule 2   docusate sodium (COLACE) 100 MG capsule Take 1 capsule (100 mg total) by mouth 2 (two) times daily. 60 capsule 2   escitalopram (LEXAPRO) 20 MG tablet Take by mouth.     gabapentin (NEURONTIN) 300 MG capsule Take 2 in the morning, 1 in the afternoon 1 at nighttime 120 capsule 3   ibuprofen (ADVIL) 600 MG tablet Take 1 tablet (600 mg total) by mouth 4 (four) times daily. 120 tablet 1   lidocaine (LIDODERM) 5 % Place 1 patch onto the skin daily. Remove & Discard patch within 12 hours or as directed by MD 30 patch 0   methocarbamol (ROBAXIN-750) 750 MG tablet Take 1 tablet (750 mg total) by mouth 4 (four) times daily. 120 tablet 1   ondansetron (ZOFRAN) 8 MG tablet Take 1 tablet (8 mg total) by mouth every 8 (eight) hours as needed for nausea or vomiting. 20 tablet 1   QUEtiapine (SEROQUEL) 25 MG tablet Take 50 mg by mouth 2 (two) times daily.      traMADol (ULTRAM) 50 MG tablet Take 1 tablet (50 mg total) by mouth every 6 (six) hours as needed (take one tablet 30 minutes prior to radiation treatments and every 6 hours as needed thereafter). 30 tablet 1   traZODone (DESYREL) 100 MG tablet TAKE DAILY AS DIRECTED FOR INSOMNIA (Patient not taking: Reported on 04/01/2020)     traZODone (DESYREL) 25 mg TABS tablet Take 25 mg by mouth at bedtime.     No current facility-administered medications for this visit.    PHYSICAL EXAMINATION: ECOG PERFORMANCE STATUS: 2 - Symptomatic, <50% confined to bed  Vitals:   02/16/23 1141  BP: 115/74  Pulse: 69  Resp: 18  Temp: 98.4 F (36.9 C)  SpO2: 95%   Wt Readings from Last 3 Encounters:  02/16/23 182 lb 1.6 oz (82.6 kg)  12/23/22 184 lb 3.2 oz (83.6 kg)  11/18/22 184 lb 4.8 oz (83.6 kg)     GENERAL:alert, no distress and comfortable SKIN: skin color normal, no rashes or significant lesions EYES: normal, Conjunctiva are pink and non-injected, sclera clear  NEURO: alert & oriented x 3 with fluent speech  LABORATORY DATA:  I have reviewed the data as listed    Latest Ref Rng & Units 02/15/2023   11:34 AM 01/12/2023   10:42 AM 12/20/2022    9:30 AM  CBC  WBC 4.0 - 10.5 K/uL 4.2  3.7  4.1   Hemoglobin 13.0 - 17.0 g/dL 78.2  95.6  21.3   Hematocrit 39.0 - 52.0 % 34.4  36.3  38.8   Platelets 150 - 400 K/uL 259  240  227         Latest Ref Rng & Units 02/15/2023   11:34 AM 01/12/2023   10:42 AM 12/20/2022    9:30 AM  CMP  Glucose 70 - 99 mg/dL 98  89  086   BUN 8 - 23 mg/dL 12  16  11    Creatinine 0.61 - 1.24 mg/dL 5.78  4.69  6.29   Sodium 135 - 145 mmol/L 138  141  142   Potassium 3.5 - 5.1 mmol/L 4.4  4.4  5.1   Chloride 98 - 111 mmol/L  105  105  104   CO2 22 - 32 mmol/L 28  32  33   Calcium 8.9 - 10.3 mg/dL 8.4  8.9  9.1   Total Protein 6.5 - 8.1 g/dL 6.3  6.2  5.9   Total Bilirubin 0.3 - 1.2 mg/dL 0.4  0.4  0.4   Alkaline Phos 38 - 126 U/L 96  92  103   AST 15 - 41 U/L 15   15  17    ALT 0 - 44 U/L 16  12  13        RADIOGRAPHIC STUDIES: I have personally reviewed the radiological images as listed and agreed with the findings in the report. No results found.    No orders of the defined types were placed in this encounter.  All questions were answered. The patient knows to call the clinic with any problems, questions or concerns. No barriers to learning was detected. The total time spent in the appointment was 30 minutes.     Malachy Mood, MD 02/16/2023   Carolin Coy, CMA, am acting as scribe for Malachy Mood, MD.   I have reviewed the above documentation for accuracy and completeness, and I agree with the above.

## 2023-02-16 NOTE — Telephone Encounter (Signed)
Oral Oncology Patient Advocate Encounter  Prior Authorization for Nathan Berg has been approved.    PA# [id number] Effective dates: 02/16/23 through 02/16/24  Patients co-pay is $0.    Jinger Neighbors, CPhT-Adv Oncology Pharmacy Patient Advocate [facility] Direct Number: [phone]  Fax: [phone]

## 2023-02-16 NOTE — Telephone Encounter (Signed)
Oral Oncology Patient Advocate Encounter   Received notification that prior authorization for Nathan Berg is required.   PA submitted on 02/16/23 Key [id number] Status is pending     Jinger Neighbors, CPhT-Adv Oncology Pharmacy Patient Advocate [facility] Direct Number: [phone]  Fax: [phone]

## 2023-02-16 NOTE — Assessment & Plan Note (Signed)
-  He has not had any bone medications -Recent PET scan showed a diffuse bone metastasis. -I discussed the benefit and potential side effect of biphosphonate and Xgeva, he is open to treatment.  He just had teeth extractions 3 weeks ago, will plan to start on next visit in 2 months.  For his convenience, will offer Xgeva every 2 months.

## 2023-03-03 ENCOUNTER — Other Ambulatory Visit: Payer: Self-pay

## 2023-03-08 ENCOUNTER — Other Ambulatory Visit: Payer: Self-pay

## 2023-03-08 ENCOUNTER — Telehealth: Payer: Self-pay

## 2023-03-08 ENCOUNTER — Other Ambulatory Visit (HOSPITAL_COMMUNITY): Payer: Self-pay

## 2023-03-08 ENCOUNTER — Other Ambulatory Visit: Payer: Self-pay | Admitting: Hematology

## 2023-03-08 DIAGNOSIS — C61 Malignant neoplasm of prostate: Secondary | ICD-10-CM

## 2023-03-08 DIAGNOSIS — C7951 Secondary malignant neoplasm of bone: Secondary | ICD-10-CM

## 2023-03-08 MED ORDER — ENZALUTAMIDE 80 MG PO TABS
160.0000 mg | ORAL_TABLET | Freq: Every day | ORAL | 0 refills | Status: DC
  Filled 2023-03-08: qty 60, 30d supply, fill #0

## 2023-03-08 NOTE — Telephone Encounter (Signed)
Pt called c/o 7/10 pain in the rt upper shoulder area.  Pt stated the pain is more near the rt shoulder blade and also in the anterior chest.  Pt describes the pain as a constant sharp/throbbing pain.  Pt stated he's taking Acetaminophen, Advil, Norco (medication he had leftover from a previous procedure), and Gabapentin.  Pt denied seeing or contacting an Orthopedic provider regarding shoulder pain.  Pt wants to know if he could be seen or speak to Dr. Mosetta Putt.  Informed pt that Dr. Mosetta Putt is currently in clinic seeing pt but this nurse will make Dr. Mosetta Putt and APPs aware of the pt's call.  Pt had no further questions or concerns at this time.

## 2023-03-08 NOTE — Telephone Encounter (Signed)
Sharlette Dense, CMA spoke with pt regarding previous call today regarding shoulder pain and anterior chest pain.  John informed pt that Dr. Mosetta Putt wants the pt to come in for labs and for a CXR of the RT Shoulder and chest.  Pt stated he will be able to come on tomorrow 03/09/2023 for lab at 9am and will do CXR afterwards at [facility].  Pt had no further questions or concerns at this time.

## 2023-03-09 ENCOUNTER — Inpatient Hospital Stay: Payer: Medicare Other | Attending: Oncology

## 2023-03-09 ENCOUNTER — Ambulatory Visit (HOSPITAL_COMMUNITY)
Admission: RE | Admit: 2023-03-09 | Discharge: 2023-03-09 | Disposition: A | Discharge status: Home / Self Care | Payer: Medicare Other | Source: Ambulatory Visit | Attending: Hematology | Admitting: Hematology

## 2023-03-09 DIAGNOSIS — C7951 Secondary malignant neoplasm of bone: Secondary | ICD-10-CM | POA: Insufficient documentation

## 2023-03-09 DIAGNOSIS — M25511 Pain in right shoulder: Secondary | ICD-10-CM | POA: Diagnosis not present

## 2023-03-09 DIAGNOSIS — C61 Malignant neoplasm of prostate: Secondary | ICD-10-CM | POA: Insufficient documentation

## 2023-03-09 LAB — CMP (CANCER CENTER ONLY)
ALT: 20 U/L (ref 0–44)
AST: 19 U/L (ref 15–41)
Albumin: 3.8 g/dL (ref 3.5–5.0)
Alkaline Phosphatase: 98 U/L (ref 38–126)
Anion gap: 4 — ABNORMAL LOW (ref 5–15)
BUN: 16 mg/dL (ref 8–23)
CO2: 31 mmol/L (ref 22–32)
Calcium: 9.2 mg/dL (ref 8.9–10.3)
Chloride: 106 mmol/L (ref 98–111)
Creatinine: 0.87 mg/dL (ref 0.61–1.24)
GFR, Estimated: >60 mL/min (ref >60)
Glucose, Bld: 86 mg/dL (ref 70–99)
Potassium: 4.1 mmol/L (ref 3.5–5.1)
Sodium: 141 mmol/L (ref 135–145)
Total Bilirubin: 0.4 mg/dL (ref 0.3–1.2)
Total Protein: 6.4 g/dL — ABNORMAL LOW (ref 6.5–8.1)

## 2023-03-09 LAB — CBC WITH DIFFERENTIAL (CANCER CENTER ONLY)
Abs Immature Granulocytes: 0.01 10*3/uL (ref 0.00–0.07)
Basophils Absolute: 0 10*3/uL (ref 0.0–0.1)
Basophils Relative: 1 %
Eosinophils Absolute: 0.2 10*3/uL (ref 0.0–0.5)
Eosinophils Relative: 5 %
HCT: 37.9 % — ABNORMAL LOW (ref 39.0–52.0)
Hemoglobin: 13 g/dL (ref 13.0–17.0)
Immature Granulocytes: 0 %
Lymphocytes Relative: 22 %
Lymphs Abs: 0.8 10*3/uL (ref 0.7–4.0)
MCH: 33.4 pg (ref 26.0–34.0)
MCHC: 34.3 g/dL (ref 30.0–36.0)
MCV: 97.4 fL (ref 80.0–100.0)
Monocytes Absolute: 0.6 10*3/uL (ref 0.1–1.0)
Monocytes Relative: 15 %
Neutro Abs: 2.1 10*3/uL (ref 1.7–7.7)
Neutrophils Relative %: 57 %
Platelet Count: 260 10*3/uL (ref 150–400)
RBC: 3.89 MIL/uL — ABNORMAL LOW (ref 4.22–5.81)
RDW: 14.1 % (ref 11.5–15.5)
WBC Count: 3.7 10*3/uL — ABNORMAL LOW (ref 4.0–10.5)
nRBC: 0 % (ref 0.0–0.2)

## 2023-03-10 ENCOUNTER — Telehealth: Payer: Self-pay

## 2023-03-10 LAB — TESTOSTERONE: Testosterone: 26 ng/dL — ABNORMAL LOW (ref 264–916)

## 2023-03-10 LAB — PROSTATE-SPECIFIC AG, SERUM (LABCORP): Prostate Specific Ag, Serum: 25 ng/mL — ABNORMAL HIGH (ref 0.0–4.0)

## 2023-03-10 NOTE — Telephone Encounter (Signed)
Spoke w/pt's spouse d/t pt was asleep regarding pt's recent CXR & PSA lab results.  Stated that Dr. Mosetta Putt has reviewed the pt's CXR and it was negative.  Stated pt's PSA went down and is now 25.  Stated Dr. Mosetta Putt does not feel that the pain the pt is experiencing in his chest and shoulder are not cancer related.  Stated Dr. Mosetta Putt recommends pt f/u with his PCP or an Orthopedic Specialist.  Pt's spouse verbalized understanding and had no further questions or concerns.

## 2023-03-11 ENCOUNTER — Other Ambulatory Visit (HOSPITAL_COMMUNITY): Payer: Self-pay

## 2023-03-15 ENCOUNTER — Ambulatory Visit (INDEPENDENT_AMBULATORY_CARE_PROVIDER_SITE_OTHER): Payer: Medicare Other | Admitting: Orthopaedic Surgery

## 2023-03-15 DIAGNOSIS — G8929 Other chronic pain: Secondary | ICD-10-CM

## 2023-03-15 DIAGNOSIS — M25511 Pain in right shoulder: Secondary | ICD-10-CM

## 2023-03-15 NOTE — Progress Notes (Addendum)
Office Visit Note   Patient: Nathan Berg           Date of Birth: [birth year]           MRN: [id number] Visit Date: 03/15/2023              Requested by: Malachy Mood, MD [address] [place],  [place] [address] PCP: Malachy Mood, MD   Assessment & Plan: Visit Diagnoses:  1. Chronic right shoulder pain     Plan: Patient is a 67 year old gentleman with right scapular trigger point.  Ashby Dawes of the condition was explained that I have recommended outpatient physical therapy.  I have also demonstrated exercises and maneuvers that he can do at home.  Recommend continuing to take Advil as needed.  Heat recommended.  Follow-Up Instructions: No follow-ups on file.   Orders:  Orders Placed This Encounter  Procedures   Ambulatory referral to Physical Therapy   No orders of the defined types were placed in this encounter.     Procedures: No procedures performed   Clinical Data: No additional findings.   Subjective: Chief Complaint  Patient presents with   Spine - Pain   Right Shoulder - Pain    HPI Nathan Berg is a 67 year old gentleman here for evaluation of right posterior shoulder pain between the scapula and the thoracic spine.  Denies any injuries.  Denies any radicular symptoms.  He has had pain for about 3 weeks now.  He reports constant pain.  He reports that he went to his oncologist for this initially and was ruled out for cardiac event and malignancy. Review of Systems  Constitutional: Negative.   HENT: Negative.    Eyes: Negative.   Respiratory: Negative.    Cardiovascular: Negative.   Gastrointestinal: Negative.   Endocrine: Negative.   Genitourinary: Negative.   Skin: Negative.   Allergic/Immunologic: Negative.   Neurological: Negative.   Hematological: Negative.   Psychiatric/Behavioral: Negative.    All other systems reviewed and are negative.    Objective: Vital Signs: There were no vitals taken for this visit.  Physical Exam Vitals and nursing  note reviewed.  Constitutional:      Appearance: He is well-developed.  HENT:     Head: Normocephalic and atraumatic.  Eyes:     Pupils: Pupils are equal, round, and reactive to light.  Pulmonary:     Effort: Pulmonary effort is normal.  Abdominal:     Palpations: Abdomen is soft.  Musculoskeletal:        General: Normal range of motion.     Cervical back: Neck supple.  Skin:    General: Skin is warm.  Neurological:     Mental Status: He is alert and oriented to person, place, and time.  Psychiatric:        Behavior: Behavior normal.        Thought Content: Thought content normal.        Judgment: Judgment normal.     Ortho Exam Exam of the right shoulder shows full range of motion.  Normal strength to manual muscle testing.  He has tenderness along the medial border of the scapula. Specialty Comments:  No specialty comments available.  Imaging: No results found.   PMFS History: Patient Active Problem List   Diagnosis Date Noted   Metastasis to bone (HCC) 04/12/2022   Pneumothorax, traumatic 07/16/2019   Malignant neoplasm of prostate (HCC) 09/19/2013   Past Medical History:  Diagnosis Date   Arthritis    Cancer (  HCC)    dx. 2 months ago- Prostate cancer   GERD (gastroesophageal reflux disease)    H/O seasonal allergies    History of kidney stones    x1   Prostate cancer (HCC)     Family History  Problem Relation Age of Onset   Prostate cancer Father    Breast cancer Neg Hx    Colon cancer Neg Hx    Pancreatic cancer Neg Hx     Past Surgical History:  Procedure Laterality Date   CHEST TUBE INSERTION  07/16/2019   trauma    COLONOSCOPY     HERNIA REPAIR Bilateral    20 yrs ago   LYMPHADENECTOMY Bilateral 09/19/2013   Procedure: LYMPHADENECTOMY  PELVIC LYMPH NODE DISSECTION;  Surgeon: Valetta Fuller, MD;  Location: [facility];  Service: Urology;  Laterality: Bilateral;   PROSTATE BIOPSY     ROBOT ASSISTED LAPAROSCOPIC RADICAL PROSTATECTOMY N/A 09/19/2013    Procedure: ROBOTIC ASSISTED LAPAROSCOPIC RADICAL PROSTATECTOMY;  Surgeon: Valetta Fuller, MD;  Location: [facility];  Service: Urology;  Laterality: N/A;   TONSILLECTOMY     child   VARICOCELE EXCISION     Social History   Occupational History   Not on file  Tobacco Use   Smoking status: Never   Smokeless tobacco: Never  Vaping Use   Vaping status: Never Used  Substance and Sexual Activity   Alcohol use: Yes    Comment: 4 beers per week   Drug use: No   Sexual activity: Yes

## 2023-03-25 ENCOUNTER — Other Ambulatory Visit: Payer: Self-pay

## 2023-03-28 ENCOUNTER — Other Ambulatory Visit: Payer: Self-pay

## 2023-03-28 ENCOUNTER — Inpatient Hospital Stay (HOSPITAL_COMMUNITY)
Admission: EM | Admit: 2023-03-28 | Discharge: 2023-04-13 | DRG: 519 | Disposition: A | Discharge status: Rehabilitation Facility | Payer: Medicare Other | Attending: Family Medicine | Admitting: Family Medicine

## 2023-03-28 ENCOUNTER — Encounter (HOSPITAL_COMMUNITY): Payer: Self-pay

## 2023-03-28 DIAGNOSIS — R200 Anesthesia of skin: Secondary | ICD-10-CM | POA: Diagnosis not present

## 2023-03-28 DIAGNOSIS — D630 Anemia in neoplastic disease: Secondary | ICD-10-CM | POA: Diagnosis present

## 2023-03-28 DIAGNOSIS — R531 Weakness: Principal | ICD-10-CM

## 2023-03-28 DIAGNOSIS — E871 Hypo-osmolality and hyponatremia: Secondary | ICD-10-CM | POA: Diagnosis present

## 2023-03-28 DIAGNOSIS — C7940 Secondary malignant neoplasm of unspecified part of nervous system: Secondary | ICD-10-CM | POA: Diagnosis not present

## 2023-03-28 DIAGNOSIS — E876 Hypokalemia: Secondary | ICD-10-CM | POA: Diagnosis present

## 2023-03-28 DIAGNOSIS — Z923 Personal history of irradiation: Secondary | ICD-10-CM

## 2023-03-28 DIAGNOSIS — M4804 Spinal stenosis, thoracic region: Secondary | ICD-10-CM | POA: Diagnosis present

## 2023-03-28 DIAGNOSIS — F411 Generalized anxiety disorder: Secondary | ICD-10-CM | POA: Diagnosis present

## 2023-03-28 DIAGNOSIS — Z87442 Personal history of urinary calculi: Secondary | ICD-10-CM

## 2023-03-28 DIAGNOSIS — M4854XA Collapsed vertebra, not elsewhere classified, thoracic region, initial encounter for fracture: Secondary | ICD-10-CM | POA: Insufficient documentation

## 2023-03-28 DIAGNOSIS — F32A Depression, unspecified: Secondary | ICD-10-CM | POA: Diagnosis present

## 2023-03-28 DIAGNOSIS — D84821 Immunodeficiency due to drugs: Secondary | ICD-10-CM | POA: Diagnosis present

## 2023-03-28 DIAGNOSIS — S24101A Unspecified injury at T1 level of thoracic spinal cord, initial encounter: Secondary | ICD-10-CM | POA: Diagnosis not present

## 2023-03-28 DIAGNOSIS — M4854XD Collapsed vertebra, not elsewhere classified, thoracic region, subsequent encounter for fracture with routine healing: Secondary | ICD-10-CM | POA: Diagnosis not present

## 2023-03-28 DIAGNOSIS — R29898 Other symptoms and signs involving the musculoskeletal system: Principal | ICD-10-CM | POA: Insufficient documentation

## 2023-03-28 DIAGNOSIS — Z66 Do not resuscitate: Secondary | ICD-10-CM | POA: Diagnosis present

## 2023-03-28 DIAGNOSIS — D7589 Other specified diseases of blood and blood-forming organs: Secondary | ICD-10-CM | POA: Diagnosis present

## 2023-03-28 DIAGNOSIS — R32 Unspecified urinary incontinence: Secondary | ICD-10-CM | POA: Diagnosis present

## 2023-03-28 DIAGNOSIS — Z23 Encounter for immunization: Secondary | ICD-10-CM | POA: Diagnosis present

## 2023-03-28 DIAGNOSIS — Z8042 Family history of malignant neoplasm of prostate: Secondary | ICD-10-CM

## 2023-03-28 DIAGNOSIS — M8458XD Pathological fracture in neoplastic disease, other specified site, subsequent encounter for fracture with routine healing: Secondary | ICD-10-CM | POA: Diagnosis not present

## 2023-03-28 DIAGNOSIS — D649 Anemia, unspecified: Secondary | ICD-10-CM | POA: Diagnosis present

## 2023-03-28 DIAGNOSIS — R011 Cardiac murmur, unspecified: Secondary | ICD-10-CM | POA: Diagnosis not present

## 2023-03-28 DIAGNOSIS — Z79899 Other long term (current) drug therapy: Secondary | ICD-10-CM

## 2023-03-28 DIAGNOSIS — C7951 Secondary malignant neoplasm of bone: Secondary | ICD-10-CM | POA: Diagnosis present

## 2023-03-28 DIAGNOSIS — M4714 Other spondylosis with myelopathy, thoracic region: Secondary | ICD-10-CM | POA: Diagnosis not present

## 2023-03-28 DIAGNOSIS — C7949 Secondary malignant neoplasm of other parts of nervous system: Secondary | ICD-10-CM | POA: Diagnosis not present

## 2023-03-28 DIAGNOSIS — M1990 Unspecified osteoarthritis, unspecified site: Secondary | ICD-10-CM | POA: Diagnosis present

## 2023-03-28 DIAGNOSIS — K5900 Constipation, unspecified: Secondary | ICD-10-CM | POA: Diagnosis not present

## 2023-03-28 DIAGNOSIS — M79606 Pain in leg, unspecified: Secondary | ICD-10-CM | POA: Diagnosis present

## 2023-03-28 DIAGNOSIS — G9520 Unspecified cord compression: Secondary | ICD-10-CM

## 2023-03-28 DIAGNOSIS — F4320 Adjustment disorder, unspecified: Secondary | ICD-10-CM | POA: Diagnosis not present

## 2023-03-28 DIAGNOSIS — T451X5D Adverse effect of antineoplastic and immunosuppressive drugs, subsequent encounter: Secondary | ICD-10-CM | POA: Diagnosis not present

## 2023-03-28 DIAGNOSIS — C61 Malignant neoplasm of prostate: Secondary | ICD-10-CM | POA: Diagnosis present

## 2023-03-28 DIAGNOSIS — M545 Low back pain, unspecified: Secondary | ICD-10-CM | POA: Diagnosis present

## 2023-03-28 DIAGNOSIS — G8222 Paraplegia, incomplete: Secondary | ICD-10-CM | POA: Diagnosis not present

## 2023-03-28 DIAGNOSIS — G8220 Paraplegia, unspecified: Secondary | ICD-10-CM | POA: Diagnosis present

## 2023-03-28 DIAGNOSIS — D519 Vitamin B12 deficiency anemia, unspecified: Secondary | ICD-10-CM | POA: Diagnosis present

## 2023-03-28 DIAGNOSIS — R5381 Other malaise: Secondary | ICD-10-CM | POA: Diagnosis not present

## 2023-03-28 DIAGNOSIS — I10 Essential (primary) hypertension: Secondary | ICD-10-CM | POA: Diagnosis present

## 2023-03-28 DIAGNOSIS — E538 Deficiency of other specified B group vitamins: Secondary | ICD-10-CM | POA: Diagnosis present

## 2023-03-28 DIAGNOSIS — G0491 Myelitis, unspecified: Secondary | ICD-10-CM | POA: Diagnosis present

## 2023-03-28 DIAGNOSIS — I4510 Unspecified right bundle-branch block: Secondary | ICD-10-CM | POA: Diagnosis present

## 2023-03-28 DIAGNOSIS — M8458XA Pathological fracture in neoplastic disease, other specified site, initial encounter for fracture: Principal | ICD-10-CM | POA: Diagnosis present

## 2023-03-28 DIAGNOSIS — K219 Gastro-esophageal reflux disease without esophagitis: Secondary | ICD-10-CM | POA: Diagnosis present

## 2023-03-28 DIAGNOSIS — R600 Localized edema: Secondary | ICD-10-CM | POA: Diagnosis not present

## 2023-03-28 DIAGNOSIS — M546 Pain in thoracic spine: Secondary | ICD-10-CM | POA: Insufficient documentation

## 2023-03-28 DIAGNOSIS — Z9079 Acquired absence of other genital organ(s): Secondary | ICD-10-CM

## 2023-03-28 DIAGNOSIS — Z885 Allergy status to narcotic agent status: Secondary | ICD-10-CM | POA: Diagnosis not present

## 2023-03-28 DIAGNOSIS — Z9221 Personal history of antineoplastic chemotherapy: Secondary | ICD-10-CM

## 2023-03-28 DIAGNOSIS — S22030D Wedge compression fracture of third thoracic vertebra, subsequent encounter for fracture with routine healing: Secondary | ICD-10-CM | POA: Diagnosis not present

## 2023-03-28 DIAGNOSIS — I444 Left anterior fascicular block: Secondary | ICD-10-CM | POA: Diagnosis present

## 2023-03-28 DIAGNOSIS — B028 Zoster with other complications: Secondary | ICD-10-CM | POA: Diagnosis not present

## 2023-03-28 DIAGNOSIS — G992 Myelopathy in diseases classified elsewhere: Secondary | ICD-10-CM | POA: Diagnosis present

## 2023-03-28 DIAGNOSIS — G9529 Other cord compression: Secondary | ICD-10-CM | POA: Diagnosis present

## 2023-03-28 DIAGNOSIS — Z880 Allergy status to penicillin: Secondary | ICD-10-CM | POA: Diagnosis not present

## 2023-03-28 HISTORY — DX: Anxiety disorder, unspecified: F41.9

## 2023-03-28 HISTORY — DX: Depression, unspecified: F32.A

## 2023-03-28 LAB — COMPREHENSIVE METABOLIC PANEL
ALT: 32 U/L (ref 0–44)
AST: 27 U/L (ref 15–41)
Albumin: 4 g/dL (ref 3.5–5.0)
Alkaline Phosphatase: 99 U/L (ref 38–126)
Anion gap: 8 (ref 5–15)
BUN: 15 mg/dL (ref 8–23)
CO2: 28 mmol/L (ref 22–32)
Calcium: 8.9 mg/dL (ref 8.9–10.3)
Chloride: 100 mmol/L (ref 98–111)
Creatinine, Ser: 0.83 mg/dL (ref 0.61–1.24)
GFR, Estimated: >60 mL/min (ref >60)
Glucose, Bld: 105 mg/dL — ABNORMAL HIGH (ref 70–99)
Potassium: 3.8 mmol/L (ref 3.5–5.1)
Sodium: 136 mmol/L (ref 135–145)
Total Bilirubin: 0.6 mg/dL (ref 0.3–1.2)
Total Protein: 7.2 g/dL (ref 6.5–8.1)

## 2023-03-28 LAB — MAGNESIUM: Magnesium: 1.9 mg/dL (ref 1.7–2.4)

## 2023-03-28 LAB — CBC WITH DIFFERENTIAL/PLATELET
Abs Immature Granulocytes: 0.01 10*3/uL (ref 0.00–0.07)
Basophils Absolute: 0 10*3/uL (ref 0.0–0.1)
Basophils Relative: 1 %
Eosinophils Absolute: 0.2 10*3/uL (ref 0.0–0.5)
Eosinophils Relative: 3 %
HCT: 38 % — ABNORMAL LOW (ref 39.0–52.0)
Hemoglobin: 12.8 g/dL — ABNORMAL LOW (ref 13.0–17.0)
Immature Granulocytes: 0 %
Lymphocytes Relative: 28 %
Lymphs Abs: 1.3 10*3/uL (ref 0.7–4.0)
MCH: 33.7 pg (ref 26.0–34.0)
MCHC: 33.7 g/dL (ref 30.0–36.0)
MCV: 100 fL (ref 80.0–100.0)
Monocytes Absolute: 0.6 10*3/uL (ref 0.1–1.0)
Monocytes Relative: 12 %
Neutro Abs: 2.7 10*3/uL (ref 1.7–7.7)
Neutrophils Relative %: 56 %
Platelets: 381 10*3/uL (ref 150–400)
RBC: 3.8 MIL/uL — ABNORMAL LOW (ref 4.22–5.81)
RDW: 13.1 % (ref 11.5–15.5)
WBC: 4.8 10*3/uL (ref 4.0–10.5)
nRBC: 0 % (ref 0.0–0.2)

## 2023-03-28 LAB — TYPE AND SCREEN
ABO/RH(D): O POS
Antibody Screen: NEGATIVE

## 2023-03-28 LAB — TROPONIN I (HIGH SENSITIVITY)
Troponin I (High Sensitivity): 5 ng/L (ref <18)
Troponin I (High Sensitivity): 4 ng/L (ref <18)

## 2023-03-28 MED ORDER — GABAPENTIN 100 MG PO CAPS
100.0000 mg | ORAL_CAPSULE | Freq: Three times a day (TID) | ORAL | Status: DC
  Administered 2023-03-28 – 2023-04-10 (×37): 100 mg via ORAL
  Filled 2023-03-28 (×37): qty 1

## 2023-03-28 MED ORDER — LIDOCAINE 5 % EX PTCH
1.0000 | MEDICATED_PATCH | CUTANEOUS | Status: DC
  Administered 2023-03-28 – 2023-04-10 (×7): 1 via TRANSDERMAL
  Filled 2023-03-28 (×17): qty 1

## 2023-03-28 MED ORDER — FENTANYL CITRATE PF 50 MCG/ML IJ SOSY
50.0000 ug | PREFILLED_SYRINGE | Freq: Once | INTRAMUSCULAR | Status: AC
  Administered 2023-03-28: 50 ug via INTRAVENOUS
  Filled 2023-03-28: qty 1

## 2023-03-28 MED ORDER — ACETAMINOPHEN 650 MG RE SUPP: 650.0000 mg | Freq: Four times a day (QID) | RECTAL | Status: DC | PRN

## 2023-03-28 MED ORDER — FENTANYL CITRATE PF 50 MCG/ML IJ SOSY: 50.0000 ug | PREFILLED_SYRINGE | INTRAMUSCULAR | Status: DC | PRN

## 2023-03-28 MED ORDER — NALOXONE HCL 0.4 MG/ML IJ SOLN: 0.4000 mg | INTRAMUSCULAR | Status: DC | PRN

## 2023-03-28 MED ORDER — MELATONIN 3 MG PO TABS
3.0000 mg | ORAL_TABLET | Freq: Every evening | ORAL | Status: DC | PRN
  Administered 2023-03-28 – 2023-04-08 (×11): 3 mg via ORAL
  Filled 2023-03-28 (×11): qty 1

## 2023-03-28 MED ORDER — ONDANSETRON HCL 4 MG/2ML IJ SOLN: 4.0000 mg | Freq: Four times a day (QID) | INTRAMUSCULAR | Status: DC | PRN

## 2023-03-28 MED ORDER — ESCITALOPRAM OXALATE 10 MG PO TABS
20.0000 mg | ORAL_TABLET | Freq: Every day | ORAL | Status: DC
  Administered 2023-03-29 – 2023-04-07 (×10): 20 mg via ORAL
  Filled 2023-03-28 (×10): qty 2

## 2023-03-28 MED ORDER — ALPRAZOLAM 0.5 MG PO TABS
0.2500 mg | ORAL_TABLET | Freq: Two times a day (BID) | ORAL | Status: DC | PRN
  Administered 2023-03-28 – 2023-04-12 (×16): 0.25 mg via ORAL
  Filled 2023-03-28 (×16): qty 1

## 2023-03-28 MED ORDER — DEXAMETHASONE SODIUM PHOSPHATE 10 MG/ML IJ SOLN
10.0000 mg | INTRAMUSCULAR | Status: DC
  Administered 2023-03-29 – 2023-04-13 (×16): 10 mg via INTRAVENOUS
  Filled 2023-03-28 (×17): qty 1

## 2023-03-28 MED ORDER — ENZALUTAMIDE 80 MG PO TABS
160.0000 mg | ORAL_TABLET | Freq: Every day | ORAL | Status: DC
  Administered 2023-03-30 – 2023-04-13 (×15): 160 mg via ORAL
  Filled 2023-03-28 (×17): qty 2

## 2023-03-28 MED ORDER — ACETAMINOPHEN 325 MG PO TABS: 650.0000 mg | ORAL_TABLET | Freq: Four times a day (QID) | ORAL | Status: DC | PRN

## 2023-03-28 MED ORDER — DEXAMETHASONE SODIUM PHOSPHATE 10 MG/ML IJ SOLN
10.0000 mg | Freq: Once | INTRAMUSCULAR | Status: AC
  Administered 2023-03-28: 10 mg via INTRAVENOUS
  Filled 2023-03-28: qty 1

## 2023-03-28 MED ORDER — HYDROMORPHONE HCL 1 MG/ML IJ SOLN
0.5000 mg | INTRAMUSCULAR | Status: DC | PRN
  Administered 2023-03-28 – 2023-03-29 (×5): 0.5 mg via INTRAVENOUS
  Filled 2023-03-28: qty 0.5
  Filled 2023-03-28: qty 1
  Filled 2023-03-28 (×2): qty 0.5
  Filled 2023-03-28: qty 1

## 2023-03-28 NOTE — ED Triage Notes (Signed)
BIB EMS from home for bilateral leg weakness. Pt had ct scan on Friday and scan was read today and oncologist sent pt here for possible tumor in spine and concern for metastasis of cancer. Hx prostate cancer

## 2023-03-28 NOTE — ED Notes (Signed)
ED TO INPATIENT HANDOFF REPORT  ED Nurse Name and Phone #: Linus Orn Name/Age/Gender Nathan Berg 67 y.o. male Room/Bed: WA05/WA05  Code Status   Code Status: Limited: Do not attempt resuscitation (DNR) -DNR-LIMITED -Do Not Intubate/DNI   Home/SNF/Other Home Patient oriented to: self, place, time, and situation Is this baseline? Yes   Triage Complete: Triage complete  Chief Complaint Lower extremity weakness [R29.898]  Triage Note BIB EMS from home for bilateral leg weakness. Pt had ct scan on Friday and scan was read today and oncologist sent pt here for possible tumor in spine and concern for metastasis of cancer. Hx prostate cancer   Allergies Allergies  Allergen Reactions   Codeine Nausea And Vomiting   Penicillin G     Other reaction(s): stomach upset    Level of Care/Admitting Diagnosis ED Disposition     ED Disposition  Admit   Condition  --   Comment  Hospital Area: [facility] [[id number]]  Level of Care: Med-Surg [16]  May admit patient to [facility] or [facility] if equivalent level of care is available:: No  Covid Evaluation: Asymptomatic - no recent exposure (last 10 days) testing not required  Diagnosis: Lower extremity weakness [244010]  Admitting Physician: Angie Fava [[id number]]  Attending Physician: Angie Fava [[id number]]  Certification:: I certify this patient will need inpatient services for at least 2 midnights  Expected Medical Readiness: 03/30/2023          B Medical/Surgery History Past Medical History:  Diagnosis Date   Arthritis    Cancer (HCC)    Prostate cancer   GERD (gastroesophageal reflux disease)    H/O seasonal allergies    History of kidney stones    x1   Prostate cancer [facility])    Past Surgical History:  Procedure Laterality Date   CHEST TUBE INSERTION  07/16/2019   trauma    COLONOSCOPY     HERNIA REPAIR Bilateral    20 yrs ago   LYMPHADENECTOMY Bilateral 09/19/2013    Procedure: LYMPHADENECTOMY  PELVIC LYMPH NODE DISSECTION;  Surgeon: Valetta Fuller, MD;  Location: [facility];  Service: Urology;  Laterality: Bilateral;   PROSTATE BIOPSY     ROBOT ASSISTED LAPAROSCOPIC RADICAL PROSTATECTOMY N/A 09/19/2013   Procedure: ROBOTIC ASSISTED LAPAROSCOPIC RADICAL PROSTATECTOMY;  Surgeon: Valetta Fuller, MD;  Location: [facility];  Service: Urology;  Laterality: N/A;   TONSILLECTOMY     child   VARICOCELE EXCISION       A IV Location/Drains/Wounds Patient Lines/Drains/Airways Status     Active Line/Drains/Airways     Name Placement date Placement time Site Days   Peripheral IV 03/28/23 20 G 1" Anterior;Distal;Left;Upper Arm 03/28/23  1755  Arm  less than 1            Intake/Output Last 24 hours No intake or output data in the 24 hours ending 03/28/23 2101  Labs/Imaging Results for orders placed or performed during the hospital encounter of 03/28/23 (from the past 48 hour(s))  CBC with Differential     Status: Abnormal   Collection Time: 03/28/23  6:00 PM  Result Value Ref Range   WBC 4.8 4.0 - 10.5 K/uL   RBC 3.80 (L) 4.22 - 5.81 MIL/uL   Hemoglobin 12.8 (L) 13.0 - 17.0 g/dL   HCT 25.9 (L) 56.3 - 87.5 %   MCV 100.0 80.0 - 100.0 fL   MCH 33.7 26.0 - 34.0 pg   MCHC 33.7 30.0 - 36.0  g/dL   RDW 95.6 21.3 - 08.6 %   Platelets 381 150 - 400 K/uL   nRBC 0.0 0.0 - 0.2 %   Neutrophils Relative % 56 %   Neutro Abs 2.7 1.7 - 7.7 K/uL   Lymphocytes Relative 28 %   Lymphs Abs 1.3 0.7 - 4.0 K/uL   Monocytes Relative 12 %   Monocytes Absolute 0.6 0.1 - 1.0 K/uL   Eosinophils Relative 3 %   Eosinophils Absolute 0.2 0.0 - 0.5 K/uL   Basophils Relative 1 %   Basophils Absolute 0.0 0.0 - 0.1 K/uL   Immature Granulocytes 0 %   Abs Immature Granulocytes 0.01 0.00 - 0.07 K/uL    Comment: Performed at [facility], [address] [place], [place] [address]  Comprehensive metabolic panel     Status: Abnormal   Collection Time: 03/28/23  6:00 PM   Result Value Ref Range   Sodium 136 135 - 145 mmol/L   Potassium 3.8 3.5 - 5.1 mmol/L   Chloride 100 98 - 111 mmol/L   CO2 28 22 - 32 mmol/L   Glucose, Bld 105 (H) 70 - 99 mg/dL    Comment: Glucose reference range applies only to samples taken after fasting for at least 8 hours.   BUN 15 8 - 23 mg/dL   Creatinine, Ser 9.62 0.61 - 1.24 mg/dL   Calcium 8.9 8.9 - 95.2 mg/dL   Total Protein 7.2 6.5 - 8.1 g/dL   Albumin 4.0 3.5 - 5.0 g/dL   AST 27 15 - 41 U/L   ALT 32 0 - 44 U/L   Alkaline Phosphatase 99 38 - 126 U/L   Total Bilirubin 0.6 0.3 - 1.2 mg/dL   GFR, Estimated >84 >13 mL/min    Comment: (NOTE) Calculated using the CKD-EPI Creatinine Equation (2021)    Anion gap 8 5 - 15    Comment: Performed at [facility], [address] [place], [place] [address]  Troponin I (High Sensitivity)     Status: None   Collection Time: 03/28/23  6:00 PM  Result Value Ref Range   Troponin I (High Sensitivity) 5 <18 ng/L    Comment: (NOTE) Elevated high sensitivity troponin I (hsTnI) values and significant  changes across serial measurements may suggest ACS but many other  chronic and acute conditions are known to elevate hsTnI results.  Refer to the "Links" section for chest pain algorithms and additional  guidance. Performed at [facility], [address] [place], [place] [address]   Troponin I (High Sensitivity)     Status: None   Collection Time: 03/28/23  7:55 PM  Result Value Ref Range   Troponin I (High Sensitivity) 4 <18 ng/L    Comment: (NOTE) Elevated high sensitivity troponin I (hsTnI) values and significant  changes across serial measurements may suggest ACS but many other  chronic and acute conditions are known to elevate hsTnI results.  Refer to the "Links" section for chest pain algorithms and additional  guidance. Performed at [facility], [address], [place], [place] [address]   Magnesium     Status:  None   Collection Time: 03/28/23  8:23 PM  Result Value Ref Range   Magnesium 1.9 1.7 - 2.4 mg/dL    Comment: Performed at [facility], [address], [place], [place] [address]   No results found.  Pending Labs [employer] (From admission, onward)     Start  Ordered   03/29/23 0500  CBC with Differential/Platelet  Tomorrow morning,   R        03/28/23 2000   03/29/23 0500  Comprehensive metabolic panel  Tomorrow morning,   R        03/28/23 2000   03/29/23 0500  Magnesium  Tomorrow morning,   R        03/28/23 2000   03/29/23 0500  Protime-INR  Tomorrow morning,   R        03/28/23 2000   03/28/23 2013  Urinalysis, Complete w Microscopic -Urine, Clean Catch  Once,   R       Question:  Specimen Source  Answer:  Urine, Clean Catch   03/28/23 2012   03/28/23 2001  Type and screen [facility]  Once,   R       Comments: [facility]    03/28/23 2000            Vitals/Pain Today's Vitals   03/28/23 1603 03/28/23 1840 03/28/23 1900 03/28/23 2030  BP: (!) 144/93  (!) 159/99 (!) 159/85  Pulse: 65  64 66  Resp: 19  11 15   Temp: 97.9 F (36.6 C)   98.7 F (37.1 C)  TempSrc: Oral   Oral  SpO2: 95%  95% 97%  Weight:      Height:      PainSc:  3       Isolation Precautions No active isolations  Medications Medications  acetaminophen (TYLENOL) tablet 650 mg (has no administration in time range)    Or  acetaminophen (TYLENOL) suppository 650 mg (has no administration in time range)  melatonin tablet 3 mg (has no administration in time range)  ondansetron (ZOFRAN) injection 4 mg (has no administration in time range)  naloxone (NARCAN) injection 0.4 mg (has no administration in time range)  dexamethasone (DECADRON) injection 10 mg (has no administration in time range)  ALPRAZolam (XANAX) tablet 0.25 mg (has no administration in time range)  enzalutamide (XTANDI) tablet 160 mg (has no administration in time  range)  escitalopram (LEXAPRO) tablet 20 mg (has no administration in time range)  gabapentin (NEURONTIN) capsule 100 mg (has no administration in time range)  lidocaine (LIDODERM) 5 % 1 patch (has no administration in time range)  HYDROmorphone (DILAUDID) injection 0.5 mg (0.5 mg Intravenous Given 03/28/23 2053)  dexamethasone (DECADRON) injection 10 mg (10 mg Intravenous Given 03/28/23 1804)  fentaNYL (SUBLIMAZE) injection 50 mcg (50 mcg Intravenous Given 03/28/23 1804)    Mobility non-ambulatory     Focused Assessments Weakness extremity   R Recommendations: See Admitting Provider Note  Report given to:   Additional Notes: aaox4, normally walks at home, pt feels to weak to walk. Pt can stand and pivot.

## 2023-03-28 NOTE — ED Notes (Signed)
[facility] has been notified.

## 2023-03-28 NOTE — ED Provider Notes (Signed)
[place] EMERGENCY DEPARTMENT AT [facility] Provider Note   CSN: [id number] Arrival date & time: 03/28/23  1249     History {Add pertinent medical, surgical, social history, OB history to HPI:1} Chief Complaint  Patient presents with   Extremity Weakness    Nathan Berg is a 67 y.o. male.   Extremity Weakness  66 year old male history of prostate cancer status post prostatectomy on oral anticancer drugs presenting for back pain.  He states he follows in oncology.  Recently over last couple weeks has had pain to his thoracic back.  Started to wrap around to his chest as well.  He saw an orthopedist who did an MRI.  Reportedly showed tumor and invasion of the spinal canal on thoracic MRI.  He arrives with a disc.  Over the weekend he developed difficulty walking and some increased urinary incontinence and weakness to his legs and was told to come here.  No fevers or chills.  No falls or trauma.  No weakness to his hands or arms.  He feels slightly weaker in the right leg.  No groin numbness or leg numbness in his legs.     Home Medications Prior to Admission medications   Medication Sig Start Date End Date Taking? Authorizing Provider  acetaminophen (TYLENOL) 500 MG tablet Take 2 tablets (1,000 mg total) by mouth 4 (four) times daily. 10/22/22 10/22/23  Diamantina Monks, MD  ALPRAZolam (XANAX) 1 MG tablet TAKE 1/2 TO 1 TABLET BY MOUTH TWICE DAILY AS NEEDED FOR ANXIETY. MUST HAVE OFFICE OR TELEMEDICINE VISIT FOR REFILLS 08/18/22   Malachy Mood, MD  buPROPion [facility] SR) 200 MG 12 hr tablet Take 200 mg by mouth 2 (two) times daily. 10/27/19   [provider]  celecoxib (CELEBREX) 200 MG capsule Take 1 capsule (200 mg total) by mouth daily in the afternoon. 09/02/22   Pickenpack-Cousar, Arty Baumgartner, NP  docusate sodium (COLACE) 100 MG capsule Take 1 capsule (100 mg total) by mouth 2 (two) times daily. 10/22/22 10/22/23  Diamantina Monks, MD  enzalutamide Diana Eves) 80 MG  tablet Take 2 tablets (160 mg total) by mouth daily. 03/08/23   Malachy Mood, MD  escitalopram (LEXAPRO) 20 MG tablet Take by mouth. 11/13/19   [provider]  gabapentin (NEURONTIN) 300 MG capsule Take 2 in the morning, 1 in the afternoon 1 at nighttime 11/18/22   Malachy Mood, MD  ibuprofen (ADVIL) 600 MG tablet Take 1 tablet (600 mg total) by mouth 4 (four) times daily. 10/22/22   Diamantina Monks, MD  lidocaine (LIDODERM) 5 % Place 1 patch onto the skin daily. Remove & Discard patch within 12 hours or as directed by MD 08/10/22   Malachy Mood, MD  methocarbamol (ROBAXIN-750) 750 MG tablet Take 1 tablet (750 mg total) by mouth 4 (four) times daily. 10/22/22   Diamantina Monks, MD  ondansetron (ZOFRAN) 8 MG tablet Take 1 tablet (8 mg total) by mouth every 8 (eight) hours as needed for nausea or vomiting. 11/19/22   Malachy Mood, MD  QUEtiapine (SEROQUEL) 25 MG tablet Take 50 mg by mouth 2 (two) times daily.    [provider]  traMADol (ULTRAM) 50 MG tablet Take 1 tablet (50 mg total) by mouth every 6 (six) hours as needed (take one tablet 30 minutes prior to radiation treatments and every 6 hours as needed thereafter). 04/28/22   Bruning, Ashlyn, PA-C  traZODone (DESYREL) 100 MG tablet TAKE DAILY AS DIRECTED FOR INSOMNIA Patient not  taking: Reported on 04/01/2020 07/26/19   [provider]  traZODone (DESYREL) 25 mg TABS tablet Take 25 mg by mouth at bedtime.    [provider]      Allergies    Codeine and Penicillin g    Review of Systems   Review of Systems  Musculoskeletal:  Positive for extremity weakness.    Physical Exam Updated Vital Signs BP (!) 144/93 Comment: notifying nurse  Pulse 65   Temp 97.9 F (36.6 C) (Oral)   Resp 19   Ht 5\' 11"  (1.803 m)   Wt 82.6 kg   SpO2 95%   BMI 25.40 kg/m  Physical Exam 3 out of 5 strength with knee flexion bilaterally, hyperreflexic at the knees, ankles.  Normal sensation.  ED Results / Procedures / Treatments    Labs (all labs ordered are listed, but only abnormal results are displayed) Labs Reviewed - No data to display  EKG None  Radiology No results found.  Procedures Procedures  {Document cardiac monitor, telemetry assessment procedure when appropriate:1}  Medications Ordered in ED Medications - No data to display  ED Course/ Medical Decision Making/ A&P   {   Click here for ABCD2, HEART and other calculatorsREFRESH Note before signing :1}                              Medical Decision Making  ***  {Document critical care time when appropriate:1} {Document review of labs and clinical decision tools ie heart score, Chads2Vasc2 etc:1}  {Document your independent review of radiology images, and any outside records:1} {Document your discussion with family members, caretakers, and with consultants:1} {Document social determinants of health affecting pt's care:1} {Document your decision making why or why not admission, treatments were needed:1} Final Clinical Impression(s) / ED Diagnoses Final diagnoses:  None    Rx / DC Orders ED Discharge Orders     None

## 2023-03-28 NOTE — H&P (Signed)
History and Physical      Nathan Berg:[id number] DOB: 06-29-1956 DOA: 03/28/2023; DOS: 03/28/2023  PCP: Malachy Mood, MD  Patient coming from: home   I have personally briefly reviewed patient's old medical records in [facility]  Chief Complaint: Low back pain  HPI: Nathan Berg is a 67 y.o. male with medical history significant for metastatic prostate cancer to bone, GAD, who is admitted to [facility] on 03/28/2023 with concern regarding T3, T4 spinal cord compression after presenting from home to [facility] ED complaining of low back pain.   The patient with a known history of metastatic prostate cancer, had presented to orthopedic surgery clinic, Dr. Juliene Pina of [facility], on Friday, 03/25/2023, at which time he underwent MRI of the thoracic spine.  While the MRI was performed on Friday, 03/25/2023, the read was not available until today, at which time the patient was contacted with results that included concern for new metastatic disease to T3, T4, with associated concern for spinal cord compression at this level, and associated instructions to present to the emergency department for further evaluation and management thereof.  In addition to recent worsening of his low back discomfort, the patient has noted some mild weakness involving the bilateral lower extremities over the course of the last week, left greater than right.  He denies any associated acute focal weakness at any other location, and denies any associated acute focal numbness or paresthesias, including no saddle anesthesia.  He notes that he has been urinating more frequently over the course the last week, but denies any associated dysuria, or gross hematuria.  Denies any recent bowel incontinence.  No recent subjective fever, chills, rigors, generalized myalgias.  Recent abdominal discomfort or any recent trauma.  Regarding his history of metastatic prostate cancer, he follows with Dr. Mosetta Putt as his outpatient  oncologist, and is undergoing therapy via daily oral Xtandi.   Not on any blood thinners as an outpatient, including no aspirin.  Denies any recent chest pain, shortness of breath, potation's, diaphoresis, dizziness, presyncope or syncope.  No recent orthopnea, PND, or worsening of peripheral edema.  No known history of underlying heart failure.  It is noted that the patient's MRI of the thoracic spine from Friday, 03/25/2023, is not uploaded into epic, but rather is present on disc accompanying the patient.    ED Course:  Vital signs in the ED were notable for the following: Afebrile; rates in the 60s; systolic pressures in the 140s and 150s; respiratory rate 16-19, oxygen saturation 95 to 97% on room air.  Labs were notable for the following: CMP notable for the following: Sodium 136, creatinine 0.83, glucose 105, liver enzymes within normal limits.  CBC notable for the following: Lobe cell count 4800, hemoglobin 12.8.  Per my interpretation, EKG in ED demonstrated the following: Sinus rhythm with heart rate 60, incomplete right bundle branch block, left anterior fascicular block, nonspecific T wave inversion in lead III, no evidence of ST changes, including no evidence of ST elevation.  Imaging in the ED, per corresponding formal radiology read, was notable for the following: No new imaging performed in the ED today.  EDP discussed patient's case with on-call [facility], Dr.Olin, Who recommended discussing case with on-call neurosurgery.  EDP subsequently discussed with on-call neurosurgery, Meyran, NP (working with Dr. Wynetta Emery), who recommended [facility] admission to [facility]), and conveyed that neurosurgery will formally consult to determine if surgical vs conservative intervention is warranted.  In the meantime, Meyran NP recommended  initiation of Decadron.   While in the ED, the following were administered: Decadron 10 mg IV x 1 dose, fentanyl 50 mcg IV x 1 dose.  Subsequently, the patient was  admitted to [facility] for further evaluation management of new metastatic disease to T3, T4 with associated concern for spinal cord compression at this level.    Review of Systems: As per HPI otherwise 10 point review of systems negative.   Past Medical History:  Diagnosis Date   Arthritis    Cancer [facility])    Prostate cancer   GERD (gastroesophageal reflux disease)    H/O seasonal allergies    History of kidney stones    x1   Prostate cancer [facility])     Past Surgical History:  Procedure Laterality Date   CHEST TUBE INSERTION  07/16/2019   trauma    COLONOSCOPY     HERNIA REPAIR Bilateral    20 yrs ago   LYMPHADENECTOMY Bilateral 09/19/2013   Procedure: LYMPHADENECTOMY  PELVIC LYMPH NODE DISSECTION;  Surgeon: Valetta Fuller, MD;  Location: [facility] ORS;  Service: Urology;  Laterality: Bilateral;   PROSTATE BIOPSY     ROBOT ASSISTED LAPAROSCOPIC RADICAL PROSTATECTOMY N/A 09/19/2013   Procedure: ROBOTIC ASSISTED LAPAROSCOPIC RADICAL PROSTATECTOMY;  Surgeon: Valetta Fuller, MD;  Location: [facility] ORS;  Service: Urology;  Laterality: N/A;   TONSILLECTOMY     child   VARICOCELE EXCISION      Social History:  reports that he has never smoked. He has never used smokeless tobacco. He reports current alcohol use. He reports that he does not use drugs.   Allergies  Allergen Reactions   Codeine Nausea And Vomiting   Penicillin G     Other reaction(s): stomach upset    Family History  Problem Relation Age of Onset   Prostate cancer Father    Breast cancer Neg Hx    Colon cancer Neg Hx    Pancreatic cancer Neg Hx     Family history reviewed and not pertinent    Prior to Admission medications   Medication Sig Start Date End Date Taking? Authorizing Provider  acetaminophen (TYLENOL) 500 MG tablet Take 2 tablets (1,000 mg total) by mouth 4 (four) times daily. Patient taking differently: Take 1,000 mg by mouth as needed for moderate pain. 10/22/22 10/22/23 Yes Lovick, Lennie Odor, MD   ALPRAZolam (XANAX) 1 MG tablet TAKE 1/2 TO 1 TABLET BY MOUTH TWICE DAILY AS NEEDED FOR ANXIETY. MUST HAVE OFFICE OR TELEMEDICINE VISIT FOR REFILLS 08/18/22  Yes Malachy Mood, MD  docusate sodium (COLACE) 100 MG capsule Take 1 capsule (100 mg total) by mouth 2 (two) times daily. 10/22/22 10/22/23 Yes Lovick, Lennie Odor, MD  enzalutamide Diana Eves) 80 MG tablet Take 2 tablets (160 mg total) by mouth daily. 03/08/23  Yes Malachy Mood, MD  escitalopram (LEXAPRO) 20 MG tablet Take by mouth. 11/13/19  Yes [provider]  gabapentin (NEURONTIN) 300 MG capsule Take 2 in the morning, 1 in the afternoon 1 at nighttime 11/18/22  Yes Malachy Mood, MD  ibuprofen (ADVIL) 200 MG tablet Take 800 mg by mouth as needed for mild pain or moderate pain.   Yes [provider]  lidocaine (LIDODERM) 5 % Place 1 patch onto the skin daily. Remove & Discard patch within 12 hours or as directed by MD 08/10/22  Yes Malachy Mood, MD  mirtazapine (REMERON) 7.5 MG tablet Take 7.5 mg by mouth at bedtime. 03/17/23  Yes [provider]  QUEtiapine (SEROQUEL) 100  MG tablet Take 100 mg by mouth 2 (two) times daily.   Yes [provider]  traZODone (DESYREL) 100 MG tablet Take 100 mg by mouth at bedtime. Patient takes 1/4 of a tablet, will take another 1/4 if needed. 07/26/19  Yes [provider]     Objective    Physical Exam: Vitals:   03/28/23 1259 03/28/23 1603 03/28/23 1900 03/28/23 2030  BP: (!) 152/89 (!) 144/93 (!) 159/99 (!) 159/85  Pulse: 63 65 64 66  Resp: 16 19 11 15   Temp: 98.3 F (36.8 C) 97.9 F (36.6 C)  98.7 F (37.1 C)  TempSrc: Oral Oral  Oral  SpO2: 97% 95% 95% 97%  Weight:      Height:        General: appears to be stated age; alert, oriented Skin: warm, dry, no rash Head:  AT/ Mouth:  Oral mucosa membranes appear moist, normal dentition Neck: supple; trachea midline Heart:  RRR; did not appreciate any M/R/G Lungs: CTAB, did not appreciate any wheezes, rales, or  rhonchi Abdomen: + BS; soft, ND, NT Vascular: 2+ pedal pulses b/l; 2+ radial pulses b/l Extremities: no peripheral edema, no muscle wasting Neuro: sensation intact in upper and lower extremities b/l; 5 out of 5 strength in the bilateral upper extremities; 5 out of 5 strength in the right lower extremity; 4-5 strength in the left lower extremity   Labs on Admission: I have personally reviewed following labs and imaging studies  CBC: Recent Labs  Lab 03/28/23 1800  WBC 4.8  NEUTROABS 2.7  HGB 12.8*  HCT 38.0*  MCV 100.0  PLT 381   Basic Metabolic Panel: Recent Labs  Lab 03/28/23 1800 03/28/23 2023  NA 136  --   K 3.8  --   CL 100  --   CO2 28  --   GLUCOSE 105*  --   BUN 15  --   CREATININE 0.83  --   CALCIUM 8.9  --   MG  --  1.9   GFR: Estimated Creatinine Clearance: 89.5 mL/min (by C-G formula based on SCr of 0.83 mg/dL). Liver Function Tests: Recent Labs  Lab 03/28/23 1800  AST 27  ALT 32  ALKPHOS 99  BILITOT 0.6  PROT 7.2  ALBUMIN 4.0   No results for input(s): "LIPASE", "AMYLASE" in the last 168 hours. No results for input(s): "AMMONIA" in the last 168 hours. Coagulation Profile: No results for input(s): "INR", "PROTIME" in the last 168 hours. Cardiac Enzymes: No results for input(s): "CKTOTAL", "CKMB", "CKMBINDEX", "TROPONINI" in the last 168 hours. BNP (last 3 results) No results for input(s): "PROBNP" in the last 8760 hours. HbA1C: No results for input(s): "HGBA1C" in the last 72 hours. CBG: No results for input(s): "GLUCAP" in the last 168 hours. Lipid Profile: No results for input(s): "CHOL", "HDL", "LDLCALC", "TRIG", "CHOLHDL", "LDLDIRECT" in the last 72 hours. Thyroid Function Tests: No results for input(s): "TSH", "T4TOTAL", "FREET4", "T3FREE", "THYROIDAB" in the last 72 hours. Anemia Panel: No results for input(s): "VITAMINB12", "FOLATE", "FERRITIN", "TIBC", "IRON", "RETICCTPCT" in the last 72 hours. Urine analysis: No results found for:  "COLORURINE", "APPEARANCEUR", "LABSPEC", "PHURINE", "GLUCOSEU", "HGBUR", "BILIRUBINUR", "KETONESUR", "PROTEINUR", "UROBILINOGEN", "NITRITE", "LEUKOCYTESUR"  Radiological Exams on Admission: No results found.    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  On exam some mild acute focal weakness is noted involving the left lower extremity, however, no sensory deficits are detected, including no evidence of saddle anesthesia.  No report of any fecal incontinence.  However, the patient is reporting some recent polyuria, which could potentially represent a degree of incomplete urinary emptying as a consequence of acute urinary retention.  Will assess postvoid residual scans to further evaluate for the latter.   EDP at [facility] discussed with on-call neurosurgery, Meyran, NP (working with Dr. Wynetta Emery), who recommended [facility] admission to [facility]), and conveyed that neurosurgery will formally consult to determine if surgical vs conservative intervention is warranted.  In the meantime, Meyran NP recommended initiation of Decadron.  He is status post received a first dose of Decadron in the ED today.  Neurosurgery is aware that the current media relating to the patient's MRI results is on a disk that is accompanying the patient.  They will attempt to extrapolate the data from this disc, and if unsuccessful in  this endeavor, may instead pursue updated MRI of the thoracic spine.  Of note, if if neurosurgery subsequent determines that surgical intervention is warranted, Chales Abrahams Score for this patient in the context of this surgery conveys a  0.37% perioperative risk for significant cardiac event. No evidence to suggest acutely decompensated heart failure or acute MI. Consequently, no absolute contraindications to proceeding with proposed surgery at this time.  Not on any blood thinners as an outpatient.  EKG shows no evidence of acute ischemic changes, including no evidence of acute MI.   Plan: Neurosurgery to formally consult, as above.  Admission to [facility].  N.p.o. after midnight in case the patient does subsequently require surgical intervention.  Type and screen ordered.  Check INR.  Post void residual bladder scans ordered.  Continue Decadron, per recommendation of neurosurgery.  Follow-up cautions ordered.  Check urinalysis.  Continue outpatient Lidoderm patch as well as gabapentin.  Prn IV Dilaudid.              #) metastatic prostate cancer: Documented history of such, with prior documentation of metastatic disease to the bone, now with progression of metastatic disease to involve suspicious metastatic lesions to the thoracic spine with potential cord compression, as above.  Follows with Dr. Mosetta Putt as his outpatient oncologist.  On daily oral Xtandi as an outpatient.  Plan: Continue outpatient Xtandi .  Further evaluation of suspected new metastatic disease to thoracic spine, as above.  Continue outpatient Lidoderm patch as well as gabapentin.  Prn IV Dilaudid for worsening low back pain, as above.               #) Generalized anxiety disorder: documented h/o such. On Lexapro as well as as needed Xanax as outpatient.    Plan: Continue outpatient Lexapro and as needed Xanax.       DVT prophylaxis: SCD's   Code Status: Full code Family Communication: none Disposition  Plan: Per Rounding Team Consults called: EDP at [facility] d/w on-call [facility], Dr. Charlann Boxer, as further above; additionally, [facility] EDP d/w on-call neurosurgery, Meyran NP (working with Dr. Wynetta Emery),  as further detailed above;  Admission status: Inpatient to [facility]    I SPENT GREATER THAN 75  MINUTES IN CLINICAL CARE TIME/MEDICAL DECISION-MAKING IN COMPLETING THIS ADMISSION.     Chaney Born Donyell Ding DO [facility] From 7PM - 7AM   03/28/2023, 8:40 PM

## 2023-03-29 ENCOUNTER — Inpatient Hospital Stay (HOSPITAL_COMMUNITY): Payer: Medicare Other

## 2023-03-29 DIAGNOSIS — G9520 Unspecified cord compression: Secondary | ICD-10-CM

## 2023-03-29 DIAGNOSIS — M4854XA Collapsed vertebra, not elsewhere classified, thoracic region, initial encounter for fracture: Secondary | ICD-10-CM | POA: Insufficient documentation

## 2023-03-29 DIAGNOSIS — C7951 Secondary malignant neoplasm of bone: Secondary | ICD-10-CM | POA: Insufficient documentation

## 2023-03-29 DIAGNOSIS — R29898 Other symptoms and signs involving the musculoskeletal system: Secondary | ICD-10-CM | POA: Diagnosis not present

## 2023-03-29 LAB — URINALYSIS, COMPLETE (UACMP) WITH MICROSCOPIC
Bacteria, UA: NONE SEEN
Bilirubin Urine: NEGATIVE
Glucose, UA: NEGATIVE mg/dL
Hgb urine dipstick: NEGATIVE
Ketones, ur: 5 mg/dL — AB
Leukocytes,Ua: NEGATIVE
Nitrite: NEGATIVE
Protein, ur: NEGATIVE mg/dL
Specific Gravity, Urine: 1.012 (ref 1.005–1.030)
pH: 7 (ref 5.0–8.0)

## 2023-03-29 MED ORDER — HYDROMORPHONE HCL 1 MG/ML IJ SOLN
1.0000 mg | INTRAMUSCULAR | Status: DC | PRN
  Administered 2023-03-29 – 2023-04-08 (×49): 1 mg via INTRAVENOUS
  Filled 2023-03-29 (×51): qty 1

## 2023-03-29 MED ORDER — GADOBUTROL 1 MMOL/ML IV SOLN
9.0000 mL | Freq: Once | INTRAVENOUS | Status: AC | PRN
  Administered 2023-03-29: 9 mL via INTRAVENOUS

## 2023-03-29 NOTE — Consult Note (Signed)
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  We still have chemotherapy as he has good functional status for example docetaxel, cabazitaxel, radiation therapy with radium 223, and Pluvicto. His alkaline phosphatase is not significantly elevated Recommend outpatient PSMA PET scan upon discharge Recommend evaluation by neurosurgery for surgery if indicated.  Upper back pain with band like sensation/known T3 metastasis MRI of T-spine has been ordered.  Concerning for infiltrating epidural mass if proceed with surgical debulking, please submit for tissue pathology, to make sure there is no aggressive variant or neuroendocrine transformation  Thank you for the consult, will follow with you.  All questions were answered. The patient knows to call the clinic with any problems, questions or concerns.    Melven Sartorius, MD 03/29/2023 6:18 PM   CHIEF COMPLAINTS/PURPOSE OF ADMISSION Lower extremity weakness with prostate cancer  HISTORY OF PRESENTING ILLNESS:  Nathan Berg 67 y.o. male is admitted for lower extremity weakness.  The patient is a pleasant 67 year old male with history of metastatic prostate cancer, currently on Xtandi presenting with weakness to the emergency room.  Per patient, he has gradual onset of generalized fatigue, weakness over the past week.  He has pain over the upper middle spine and over time developed a band-like sensation across the chest.  On Saturday morning, he woke up with bilateral leg weakness and feels shaky.  He described as "like rubber band".  He also saw orthopedic surgeon who performed the MRI.  He was called on Friday and report that he has tumor-infiltrating  to his spine around T3.  We do not have MRI here.  Over the weekend he developed more difficulty walking and some increased urinary incontinence so he presented to the ED yesterday.  Currently he denies any saddle anesthesia, lower extremity paresthesia or weakness.  He denies any incontinence.  Report of persistent bandlike sensation over the upper chest.  Rest of review of system was negative.  As for his prostate cancer, initially s/p radical prostatectomy and bilateral lymph node dissection in March 2015 , pT2N0 GG3 4+3 = 7.  He has salvage radiation between November 2015 to December 2015 and received 6 months of ADT.  He developed metastases in 09/2020.  Per chart, Roosvelt Maser was started in July and switched to [employer] [facility] in August 2022.  He received palliative radiation to the first rib and T3.    His PSA nadir in November 2022 7.2.  PSA gradually rise over the next year.  He was switched to [name] about May and due to rising PSA.   12/09/22 PSMA PET 1. 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 2. Metastatic skeletal lesion in the RIGHT iliac wing is increased in size compared to prior. 3. Other than the 2 above lesions, the pattern and intensity of multifocal radiotracer avid skeletal metastasis is unchanged. No new lesions identified. 4. No evidence of nodal metastasis or visceral metastasis.  02/2023 resumed Xtandi and discontinued Garey Ham due to rising PSA.    PSA rise to 34.9 on 8/13 and decreased to 25 on 9/4.   Summary of oncologic history as follows: Oncology History Overview Note   Cancer Staging  Malignant neoplasm  of prostate (HCC) Staging form: Prostate, AJCC 7th Edition - Clinical: Stage IV (TX, NX, M1, PSA: Less than 10, Gleason 8-10) - Signed by Benjiman Core, MD on 04/30/2021     Malignant neoplasm of prostate (HCC)  09/19/2013 Initial Diagnosis   Malignant neoplasm of prostate (HCC)   04/30/2021  Cancer Staging   Staging form: Prostate, AJCC 7th Edition - Clinical: Stage IV (TX, NX, M1, PSA: Less than 10, Gleason 8-10) - Signed by Benjiman Core, MD on 04/30/2021   03/11/2022 Imaging    IMPRESSION: 1. Multifocal intense radiotracer avid skeletal metastasis again noted involving the axillary skeleton. No change in pattern or number of lesions. No new lesions identified. 2. The radiotracer activity of the lesions; however, is uniformly increased from comparison exam. 3. The sclerotic pattern of the lesions on comparison CT is a similar. One lesion at T3 as new lytic component. 4. No visceral metastasis.  No new nodal disease.     12/09/2022 PET scan    IMPRESSION: 1. 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 2. Metastatic skeletal lesion in the RIGHT iliac wing is increased in size compared to prior. 3. Other than the 2 above lesions, the pattern and intensity of multifocal radiotracer avid skeletal metastasis is unchanged. No new lesions identified. 4. No evidence of nodal metastasis or visceral metastasis.       MEDICAL HISTORY:  Past Medical History:  Diagnosis Date   Arthritis    Cancer [facility])    Prostate cancer   GERD (gastroesophageal reflux disease)    H/O seasonal allergies    History of kidney stones    x1   Prostate cancer (HCC)     SURGICAL HISTORY: Past Surgical History:  Procedure Laterality Date   CHEST TUBE INSERTION  07/16/2019   trauma    COLONOSCOPY     HERNIA REPAIR Bilateral    20 yrs ago   LYMPHADENECTOMY Bilateral 09/19/2013   Procedure: LYMPHADENECTOMY  PELVIC LYMPH NODE DISSECTION;  Surgeon: Valetta Fuller, MD;  Location: [facility];  Service: Urology;  Laterality: Bilateral;   PROSTATE BIOPSY     ROBOT ASSISTED LAPAROSCOPIC RADICAL PROSTATECTOMY N/A 09/19/2013   Procedure: ROBOTIC ASSISTED LAPAROSCOPIC RADICAL PROSTATECTOMY;  Surgeon: Valetta Fuller, MD;  Location: [facility];   Service: Urology;  Laterality: N/A;   TONSILLECTOMY     child   VARICOCELE EXCISION      SOCIAL HISTORY: Social History   Socioeconomic History   Marital status: Married    Spouse name: Not on file   Number of children: Not on file   Years of education: Not on file   Highest education level: Not on file  Occupational History   Not on file  Tobacco Use   Smoking status: Never   Smokeless tobacco: Never  Vaping Use   Vaping status: Never Used  Substance and Sexual Activity   Alcohol use: Yes    Comment: 4 beers per week   Drug use: No   Sexual activity: Yes  Other Topics Concern   Not on file  Social History Narrative   Not on file   Social Determinants of Health   Financial Resource Strain: Not on file  Food Insecurity: No Food Insecurity (03/29/2023)   Hunger Vital Sign    Worried About Running Out of Food in the Last Year: Never true    Ran Out of Food in the Last Year: Never true  Transportation Needs:  No Transportation Needs (03/29/2023)   PRAPARE - Administrator, Civil Service (Medical): No    Lack of Transportation (Non-Medical): No  Physical Activity: Not on file  Stress: Not on file  Social Connections: Unknown (11/16/2021)   Received from [facility]   Social Network    Social Network: Not on file  Intimate Partner Violence: Not At Risk (03/29/2023)   Humiliation, Afraid, Rape, and Kick questionnaire    Fear of Current or Ex-Partner: No    Emotionally Abused: No    Physically Abused: No    Sexually Abused: No    FAMILY HISTORY: Family History  Problem Relation Age of Onset   Prostate cancer Father    Breast cancer Neg Hx    Colon cancer Neg Hx    Pancreatic cancer Neg Hx     ALLERGIES:  is allergic to codeine and penicillin g.  MEDICATIONS:  Current Facility-Administered Medications  Medication Dose Route Frequency Provider Last Rate Last Admin   acetaminophen (TYLENOL) tablet 650 mg  650 mg Oral Q6H PRN  Howerter, Justin B, DO       Or   acetaminophen (TYLENOL) suppository 650 mg  650 mg Rectal Q6H PRN Howerter, Justin B, DO       ALPRAZolam Prudy Feeler) tablet 0.25 mg  0.25 mg Oral BID PRN Howerter, Justin B, DO   0.25 mg at 03/28/23 2335   dexamethasone (DECADRON) injection 10 mg  10 mg Intravenous Q24H Howerter, Justin B, DO   10 mg at 03/29/23 1023   enzalutamide (XTANDI) tablet 160 mg  160 mg Oral Daily Howerter, Justin B, DO       escitalopram (LEXAPRO) tablet 20 mg  20 mg Oral Daily Howerter, Justin B, DO   20 mg at 03/29/23 1023   gabapentin (NEURONTIN) capsule 100 mg  100 mg Oral TID Howerter, Justin B, DO   100 mg at 03/29/23 1704   HYDROmorphone (DILAUDID) injection 1 mg  1 mg Intravenous Q3H PRN Leeroy Bock, MD   1 mg at 03/29/23 1348   lidocaine (LIDODERM) 5 % 1 patch  1 patch Transdermal Q24H Howerter, Justin B, DO   1 patch at 03/28/23 2330   melatonin tablet 3 mg  3 mg Oral QHS PRN Howerter, Justin B, DO   3 mg at 03/28/23 2328   naloxone (NARCAN) injection 0.4 mg  0.4 mg Intravenous PRN Howerter, Justin B, DO       ondansetron (ZOFRAN) injection 4 mg  4 mg Intravenous Q6H PRN Howerter, Justin B, DO        REVIEW OF SYSTEMS:   Constitutional: Reports generalized fatigue, weakness Eyes: Denies changes Respiratory: Denies cough, short of breath or wheezes Cardiovascular: Denies palpitation, chest discomfort or lower extremity swelling Gastrointestinal:  Denies nausea, vomiting, diarrhea, constipation, bowel incontinence, abdominal pain  Lymphatics: Denies new lymphadenopathy  Neurological: Denies any saddle paresthesia or loss of sensation in the legs.  Not much weakness currently  All other systems were reviewed with the patient and are negative.  PHYSICAL EXAMINATION: ECOG PERFORMANCE STATUS: 2 - Symptomatic, <50% confined to bed  Vitals:   03/29/23 1521 03/29/23 1632  BP: 128/77 124/80  Pulse: 74 69  Resp:    Temp: 98.3 F (36.8 C) 98.4 F (36.9 C)  SpO2: 97%  95%   Filed Weights   03/28/23 1259 03/28/23 2308 03/29/23 0500  Weight: 182 lb 1.6 oz (82.6 kg) 182 lb 1.6 oz (82.6 kg) 190 lb 0.6 oz (  86.2 kg)    GENERAL: alert, no distress and comfortable SKIN: skin color normal, no jaundice EYES: normal,  sclera clear OROPHARYNX: no exudate, no erythema and mucosa moist NECK: supple, palpable mass LYMPH:  no palpable lymphadenopathy in the cervical LUNGS: clear to auscultation and no wheezes or rales with normal breathing effort HEART: regular rate & rhythm and no murmurs and no lower extremity edema ABDOMEN: abdomen soft, non-tender and non-distended Musculoskeletal: no edema.  Tenderness about T3 PSYCH: alert with fluent speech NEURO: no focal motor/sensory deficits.  Extremity 5 out of 5 upper and lower extremities Sensation to light touch equal bilaterally  LABORATORY DATA:  I have reviewed the data as listed Lab Results  Component Value Date   WBC 4.8 03/28/2023   HGB 12.8 (L) 03/28/2023   HCT 38.0 (L) 03/28/2023   MCV 100.0 03/28/2023   PLT 381 03/28/2023   Recent Labs    02/15/23 1134 03/09/23 0913 03/28/23 1800  NA 138 141 136  K 4.4 4.1 3.8  CL 105 106 100  CO2 28 31 28   GLUCOSE 98 86 105*  BUN 12 16 15   CREATININE 0.87 0.87 0.83  CALCIUM 8.4* 9.2 8.9  GFRNONAA >60 >60 >60  PROT 6.3* 6.4* 7.2  ALBUMIN 3.7 3.8 4.0  AST 15 19 27   ALT 16 20 32  ALKPHOS 96 98 99  BILITOT 0.4 0.4 0.6    RADIOGRAPHIC STUDIES: I have personally reviewed the radiological images as listed and agreed with the findings in the report. DG Chest 2 View  Result Date: 03/09/2023 CLINICAL DATA:  Chest pain.  History of prostate cancer. EXAM: CHEST - 2 VIEW COMPARISON:  October 22, 2022.  Nov 14, 2022. FINDINGS: The heart size and mediastinal contours are within normal limits. Both lungs are clear. Elevated right hemidiaphragm is noted. Severe compression deformity of upper thoracic vertebral body is noted consistent with metastatic disease as noted  on prior PET scan. Hiatal hernia is noted. IMPRESSION: No acute cardiopulmonary abnormality is seen. Hiatal hernia. Compression deformity of upper thoracic vertebral body as noted above. Electronically Signed   By: Lupita Raider M.D.   On: 03/09/2023 13:59

## 2023-03-29 NOTE — Progress Notes (Signed)
PROGRESS NOTE  Nathan Berg    DOB: 07/24/1955, 67 y.o.  WUJ:[id number]    Code Status: Limited: Do not attempt resuscitation (DNR) -DNR-LIMITED -Do Not Intubate/DNI    DOA: 03/28/2023   LOS: 1   Brief hospital course  Nathan Berg is a 67 y.o. male with medical history significant for metastatic prostate cancer to bone, GAD, who is admitted to [facility] on 03/28/2023 with concern regarding T3, T4 spinal cord compression after presenting from home to [facility] ED complaining of low back pain.    The patient with a known history of metastatic prostate cancer, had presented to orthopedic surgery clinic, Dr. Juliene Pina of [facility], on Friday, 03/25/2023, at which time he underwent MRI of the thoracic spine.  results included concern for new metastatic disease to T3, T4, with associated concern for spinal cord compression at this level, and associated instructions to present to the emergency department for further evaluation and management thereof.   In addition to recent worsening of his low back discomfort, the patient has noted some mild weakness involving the bilateral lower extremities over the course of the last week, left greater than right.  follows with Dr. Mosetta Putt as his outpatient oncologist, and is undergoing therapy via daily oral Xtandi.      ED Course:  Vital signs in the ED were notable for the following: Afebrile; rates in the 60s; systolic pressures in the 140s and 150s; respiratory rate 16-19, oxygen saturation 95 to 97% on room air.   CMP: Sodium 136, creatinine 0.83, glucose 105, liver enzymes within normal limits.  CBC notable for the following: WBC 4800, hemoglobin 12.8. EKG: Sinus rhythm with heart rate 60, incomplete right bundle branch block, left anterior fascicular block, nonspecific T wave inversion in lead III, no evidence of ST changes, including no evidence of ST elevation.   While in the ED, the following were administered: Decadron 10 mg IV x 1 dose, fentanyl 50  mcg IV x 1 dose.   Subsequently, the patient was admitted to [facility] for further evaluation management of new metastatic disease to T3, T4 with associated concern for spinal cord compression at this level.   Neurosurgery is following for management.  Patient was admitted to medicine service for further workup and management of spinal cord compression as outlined in detail below.  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   - neurosurgery consulted for management. Awaiting imaging review to make surgical decision.  - With delay in imaging upload, going to repeat imaging to prevent delay of further treatment of patient - NPO at midnight - analgesia PRN - PT/OT   #) metastatic prostate cancer: Documented history of such, with prior documentation of metastatic disease to the bone, now with progression of metastatic disease to involve suspicious metastatic lesions to the thoracic spine with potential cord compression, as above.  Follows with Dr. Mosetta Putt as his outpatient oncologist.  On daily oral Xtandi as an outpatient. - Continue outpatient Xtandi.    #) Generalized anxiety disorder: - Continue outpatient Lexapro and as needed Xanax.  Body mass index is 27.27 kg/m.  VTE ppx: SCDs Start: 03/28/23 1958   Diet:     Diet   Diet NPO time specified    Consultants: Neurosurgery   Subjective 03/29/23    Pt reports having improved pain with dilaudid. Awaiting surgery decision.    Objective   Vitals:   03/28/23 2236 03/28/23 2308 03/29/23 0450 03/29/23 0500  BP: (!) 160/95 (!) 157/95 (!) 147/80   Pulse: 66 63 63   Resp: 19 18 16    Temp:  98.7 F (37.1 C) 98.3 F (36.8 C)   TempSrc:  Oral    SpO2: 96% 96% 98%   Weight:  82.6 kg  86.2 kg  Height:  5\' 10"  (1.778 m)      Intake/Output Summary (Last 24 hours) at 03/29/2023 0724 Last data filed at 03/29/2023 0100 Gross per 24 hour  Intake --  Output 200 ml  Net -200 ml   Filed Weights   03/28/23 1259 03/28/23 2308 03/29/23 0500  Weight: 82.6 kg 82.6 kg 86.2 kg     Physical Exam:  General: awake, alert, NAD HEENT: atraumatic, clear conjunctiva, anicteric sclera, MMM, hearing grossly normal Respiratory: normal respiratory effort. Cardiovascular: quick capillary refill, normal S1/S2, RRR Nervous: A&O x3. no gross focal neurologic deficits, normal speech Extremities: moves all equally, no edema, normal tone Skin: dry, intact, normal temperature, normal color. No rashes, lesions or ulcers on exposed skin Psychiatry: normal mood, congruent affect  Labs   I have personally reviewed the following labs and imaging studies CBC    Component Value Date/Time   WBC 4.8 03/28/2023 1800   RBC 3.80 (L) 03/28/2023 1800   HGB 12.8 (L) 03/28/2023 1800   HGB 13.0 03/09/2023 0913   HCT 38.0 (L) 03/28/2023 1800   PLT 381 03/28/2023 1800   PLT 260 03/09/2023 0913   MCV 100.0 03/28/2023 1800   MCH 33.7 03/28/2023 1800   MCHC 33.7 03/28/2023 1800   RDW 13.1 03/28/2023 1800   LYMPHSABS 1.3 03/28/2023 1800   MONOABS 0.6 03/28/2023 1800   EOSABS 0.2 03/28/2023 1800   BASOSABS 0.0 03/28/2023 1800      Latest Ref Rng & Units 03/28/2023    6:00 PM 03/09/2023    9:13 AM 02/15/2023   11:34 AM  BMP  Glucose 70 - 99 mg/dL 098  86  98   BUN 8 - 23 mg/dL 15  16  12    Creatinine 0.61 - 1.24  mg/dL 1.19  1.47  8.29   Sodium 135 - 145 mmol/L 136  141  138   Potassium 3.5 - 5.1 mmol/L 3.8  4.1  4.4   Chloride 98 - 111 mmol/L 100  106  105   CO2 22 - 32 mmol/L 28  31  28    Calcium 8.9 - 10.3 mg/dL 8.9  9.2  8.4     No results found.  Disposition Plan & Communication  Patient status: Inpatient  Admitted From: Home Planned disposition location: Home Anticipated discharge date: TBD pending neurosurgery   Family Communication: none at bedside    Author: Leeroy Bock, DO [facility] 03/29/2023, 7:24 AM   Available by Epic secure chat 7AM-7PM. If 7PM-7AM, please contact night-coverage.  [facility] contact information found on [email or web address].

## 2023-03-29 NOTE — Consult Note (Signed)
Reason for Consult:thoracic mets Referring Physician: EDP  Nathan Berg is an 67 y.o. male.   HPI:  67 year old male presented to [facility] yesterday with worsening gait instability since Saturday. He has a history of prostate cancer that he has had chemo and radiation for over the last 5 years. A couple months ago he developed pain in between his shoulder blades that radiated around to his chest. He was seen by ortho spine last week who ordered an MRI of his thoracic spine. Friday he was told that he would need to see neurosurgery. On Saturday he noticed his legs feeling weak when trying to walk. He fell several times at home on Saturday. Endorses numbness and tingling in his feet.   Past Medical History:  Diagnosis Date   Arthritis    Cancer [facility])    Prostate cancer   GERD (gastroesophageal reflux disease)    H/O seasonal allergies    History of kidney stones    x1   Prostate cancer [facility])     Past Surgical History:  Procedure Laterality Date   CHEST TUBE INSERTION  07/16/2019   trauma    COLONOSCOPY     HERNIA REPAIR Bilateral    20 yrs ago   LYMPHADENECTOMY Bilateral 09/19/2013   Procedure: LYMPHADENECTOMY  PELVIC LYMPH NODE DISSECTION;  Surgeon: Valetta Fuller, MD;  Location: [facility];  Service: Urology;  Laterality: Bilateral;   PROSTATE BIOPSY     ROBOT ASSISTED LAPAROSCOPIC RADICAL PROSTATECTOMY N/A 09/19/2013   Procedure: ROBOTIC ASSISTED LAPAROSCOPIC RADICAL PROSTATECTOMY;  Surgeon: Valetta Fuller, MD;  Location: [facility];  Service: Urology;  Laterality: N/A;   TONSILLECTOMY     child   VARICOCELE EXCISION      Allergies  Allergen Reactions   Codeine Nausea And Vomiting   Penicillin G     Other reaction(s): stomach upset    Social History   Tobacco Use   Smoking status: Never   Smokeless tobacco: Never  Substance Use Topics   Alcohol use: Yes    Comment: 4 beers per week    Family History  Problem Relation Age of Onset   Prostate cancer Father    Breast cancer Neg  Hx    Colon cancer Neg Hx    Pancreatic cancer Neg Hx      Review of Systems  Positive ROS: as above  All other systems have been reviewed and were otherwise negative with the exception of those mentioned in the HPI and as above.  Objective: Vital signs in last 24 hours: Temp:  [97.5 F (36.4 C)-98.7 F (37.1 C)] 97.5 F (36.4 C) (09/24 0800) Pulse Rate:  [63-67] 67 (09/24 0800) Resp:  [11-19] 16 (09/24 0450) BP: (144-160)/(80-99) 155/96 (09/24 0800) SpO2:  [95 %-99 %] 99 % (09/24 0800) Weight:  [82.6 kg-86.2 kg] 86.2 kg (09/24 0500)  General Appearance: Alert, cooperative, no distress, appears stated age Head: Normocephalic, without obvious abnormality, atraumatic Eyes: PERRL, conjunctiva/corneas clear, EOM's intact, fundi benign, both eyes      Lungs: respirations unlabored Heart: Regular rate and rhythm Abdomen: Soft, non-tender, bowel sounds active all four quadrants, no masses, no organomegaly Extremities: Extremities normal, atraumatic, no cyanosis or edema Pulses: 2+ and symmetric all extremities Skin: Skin color, texture, turgor normal, no rashes or lesions  NEUROLOGIC:   Mental status: A&O x4, no aphasia, good attention span, Memory and fund of knowledge Motor Exam - grossly normal, normal tone and bulk Sensory Exam - grossly normal Reflexes: symmetric,  no pathologic reflexes, No Hoffman's, No clonus Coordination - grossly normal Gait - not tested Balance - not tested Cranial Nerves: I: smell Not tested  II: visual acuity  OS: na    OD: na  II: visual fields Full to confrontation  II: pupils Equal, round, reactive to light  III,VII: ptosis None  III,IV,VI: extraocular muscles  Full ROM  V: mastication   V: facial light touch sensation    V,VII: corneal reflex    VII: facial muscle function - upper    VII: facial muscle function - lower   VIII: hearing   IX: soft palate elevation    IX,X: gag reflex   XI: trapezius strength    XI: sternocleidomastoid  strength   XI: neck flexion strength    XII: tongue strength      Data Review Lab Results  Component Value Date   WBC 4.8 03/28/2023   HGB 12.8 (L) 03/28/2023   HCT 38.0 (L) 03/28/2023   MCV 100.0 03/28/2023   PLT 381 03/28/2023   Lab Results  Component Value Date   NA 136 03/28/2023   K 3.8 03/28/2023   CL 100 03/28/2023   CO2 28 03/28/2023   BUN 15 03/28/2023   CREATININE 0.83 03/28/2023   GLUCOSE 105 (H) 03/28/2023   Lab Results  Component Value Date   INR 1.0 10/22/2022    Radiology: No results found.   Assessment/Plan: 67 year old male presented with gait instability since Saturday. He does not think that his has gotten worse since then but has stayed the same. No one was able to upload his thoracic MRI at [facility] last night. We will try to get this uploaded today so we can make surgical decisions. As reported by ED physician, it sounds like he has a thoracic epidural component to the mets causing cord compression. Please call when MRI is uploaded into the system so we can view. His leg strength is 5/5 on exam.  Tiana Loft [facility] 03/29/2023 8:26 AM

## 2023-03-29 NOTE — Plan of Care (Signed)

## 2023-03-30 ENCOUNTER — Other Ambulatory Visit: Payer: Self-pay

## 2023-03-30 ENCOUNTER — Inpatient Hospital Stay (HOSPITAL_COMMUNITY): Payer: Medicare Other

## 2023-03-30 DIAGNOSIS — C7951 Secondary malignant neoplasm of bone: Secondary | ICD-10-CM

## 2023-03-30 DIAGNOSIS — M4854XA Collapsed vertebra, not elsewhere classified, thoracic region, initial encounter for fracture: Secondary | ICD-10-CM

## 2023-03-30 DIAGNOSIS — F411 Generalized anxiety disorder: Secondary | ICD-10-CM | POA: Diagnosis not present

## 2023-03-30 DIAGNOSIS — C61 Malignant neoplasm of prostate: Secondary | ICD-10-CM

## 2023-03-30 LAB — TYPE AND SCREEN
ABO/RH(D): O POS
Antibody Screen: NEGATIVE

## 2023-03-30 LAB — BASIC METABOLIC PANEL
Anion gap: 9 (ref 5–15)
BUN: 17 mg/dL (ref 8–23)
CO2: 27 mmol/L (ref 22–32)
Calcium: 8.8 mg/dL — ABNORMAL LOW (ref 8.9–10.3)
Chloride: 100 mmol/L (ref 98–111)
Creatinine, Ser: 0.79 mg/dL (ref 0.61–1.24)
GFR, Estimated: >60 mL/min (ref >60)
Glucose, Bld: 91 mg/dL (ref 70–99)
Potassium: 3.7 mmol/L (ref 3.5–5.1)
Sodium: 136 mmol/L (ref 135–145)

## 2023-03-30 LAB — CBC
HCT: 34.9 % — ABNORMAL LOW (ref 39.0–52.0)
Hemoglobin: 11.9 g/dL — ABNORMAL LOW (ref 13.0–17.0)
MCH: 33.3 pg (ref 26.0–34.0)
MCHC: 34.1 g/dL (ref 30.0–36.0)
MCV: 97.8 fL (ref 80.0–100.0)
Platelets: 341 10*3/uL (ref 150–400)
RBC: 3.57 MIL/uL — ABNORMAL LOW (ref 4.22–5.81)
RDW: 12.9 % (ref 11.5–15.5)
WBC: 5.9 10*3/uL (ref 4.0–10.5)
nRBC: 0 % (ref 0.0–0.2)

## 2023-03-30 MED ORDER — INFLUENZA VAC A&B SURF ANT ADJ 0.5 ML IM SUSY
0.5000 mL | PREFILLED_SYRINGE | INTRAMUSCULAR | Status: AC
  Administered 2023-03-31: 0.5 mL via INTRAMUSCULAR
  Filled 2023-03-30: qty 0.5

## 2023-03-30 NOTE — Assessment & Plan Note (Addendum)
---   per MRI T-spine: " Osseous metastases involving the T3 and T4 vertebral bodies with associated pathologic compression fractures as above. Epidural extension of tumor at these levels with resultant severe spinal stenosis and cord compression at the level of T3. Probable subtle early/mild cord signal changes at this level. 2. Multiple additional smaller osseous metastases scattered elsewhere throughout the thoracic spine. No other significant extra osseous or epidural extension of tumor." --- Neurosurgery following. Underwent "Decompressive laminectomy T3 extending into the inferior aspect of the 2 3 disc base to below the 3 4 disc base at the level of T4 pedicle. With partial facetectomy and evacuation of epidural tumor " on 03/31/23 - continue with PT/OT; possible CIR placement  -Continue Decadron per neurosurgery - pain improved with regimen adjustment; continue as is for now - JP drain removed by neurosurgery 9/29 - await further recs from radiation-oncology on whether or not to do inpatient radiation (would likely delay CIR) vs outpatient radiation after CIR; CIR unable to determine candidacy without this info as well. Patient to be evaluated by Dr. Kathrynn Running on ~10/3

## 2023-03-30 NOTE — Plan of Care (Signed)

## 2023-03-30 NOTE — Plan of Care (Signed)
  Problem: Clinical Measurements: Goal: Diagnostic test results will improve Outcome: Not Progressing   Problem: Safety: Goal: Ability to remain free from injury will improve Outcome: Not Progressing   Problem: Pain Managment: Goal: General experience of comfort will improve Outcome: Not Progressing   Problem: Elimination: Goal: Will not experience complications related to bowel motility Outcome: Not Progressing   Problem: Coping: Goal: Level of anxiety will decrease Outcome: Not Progressing

## 2023-03-30 NOTE — Assessment & Plan Note (Signed)
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 Mosetta Putt; transitioning back to [facility]

## 2023-03-30 NOTE — Hospital Course (Signed)
Nathan Berg is a 67 yo male with PMH metastatic prostate cancer to the bone, GAD who presented with worsening back pain. He had undergone outpatient workup with [facility] on 03/25/2023 with MRI T-spine which showed concern for new metastatic disease involving thoracic spine with concern for cord compression.  He was referred to the ER. MRI thoracic spine repeated on admission and evaluated by surgery.  He was also started on steroids on admission.

## 2023-03-30 NOTE — Progress Notes (Signed)
Progress Note    DAQUAVION CAVETT   QMV:[id number]  DOB: 04-07-1956  DOA: 03/28/2023     2 PCP: Malachy Mood, MD  Initial CC: Back pain  Hospital Course: Mr. Westling is a 67 yo male with PMH metastatic prostate cancer to the bone, GAD who presented with worsening back pain. He had undergone outpatient workup with [facility] on 03/25/2023 with MRI T-spine which showed concern for new metastatic disease involving thoracic spine with concern for cord compression.  He was referred to the ER. MRI thoracic spine repeated on admission and evaluated by surgery.  He was also started on steroids on admission.  Interval History:  Sitting up in recliner this morning with ongoing no back pain.  States some improvement in his lower extremity transient weakness.  Mostly notes that the weakness in his legs is unpredictable.  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 Epidural extension of tumor at these levels with resultant severe spinal stenosis and cord compression at the level of T3. Probable subtle early/mild cord signal changes at this level. 2. Multiple additional smaller osseous metastases scattered elsewhere throughout the thoracic spine. 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 Mosetta Putt; transitioning back to [facility]  GAD (generalized anxiety disorder) - Continue Xanax and Lexapro    Old records reviewed in  assessment of this patient  Antimicrobials:   DVT prophylaxis:  SCDs Start: 03/28/23 1958   Code Status:   Code Status: Limited: Do not attempt resuscitation (DNR) -DNR-LIMITED -Do Not Intubate/DNI   Mobility Assessment (Last 72 Hours)     Mobility Assessment     Row Name 03/30/23 1200 03/30/23 0800 03/29/23 2125 03/29/23 1647 03/29/23 1644   Does patient have an order for bedrest or is patient medically unstable No - Continue assessment No - Continue assessment No - Continue assessment No - Continue assessment No - Continue assessment   What is the highest level of mobility based on the progressive mobility assessment? Level 5 (Walks with assist in room/hall) - Balance while stepping forward/back and can walk in room with assist - Complete Level 5 (Walks with assist in room/hall) - Balance while stepping forward/back and can walk in room with assist - Complete Level 5 (Walks with assist in room/hall) - Balance while stepping forward/back and can walk in room with assist - Complete Level 5 (Walks with assist in room/hall) - Balance while stepping forward/back and can walk in room with assist - Complete Level 5 (Walks with assist in room/hall) - Balance while stepping forward/back and can walk in room with assist - Complete    Row Name 03/29/23 0800 03/29/23 0200         Does patient have an order for bedrest or is patient medically unstable Yes- Bedfast (Level 1) - Complete Yes- Bedfast (Level 1) - Complete               Barriers to discharge: none Disposition Plan:  Home Status is: Inpt  Objective: Blood pressure (!) 164/90, pulse  63, temperature 98.3 F (36.8 C), temperature source Oral, resp. rate 18, height 5\' 10"  (1.778 m), weight 81.1 kg, SpO2 98%.  Examination:  Physical Exam Constitutional:      General: He is not in acute distress.    Appearance: Normal appearance.  HENT:     Head: Normocephalic and atraumatic.     Mouth/Throat:     Mouth: Mucous membranes are  moist.  Eyes:     Extraocular Movements: Extraocular movements intact.  Cardiovascular:     Rate and Rhythm: Normal rate and regular rhythm.  Pulmonary:     Effort: Pulmonary effort is normal. No respiratory distress.     Breath sounds: Normal breath sounds. No wheezing.  Abdominal:     General: Bowel sounds are normal. There is no distension.     Palpations: Abdomen is soft.     Tenderness: There is no abdominal tenderness.  Musculoskeletal:        General: Normal range of motion.     Cervical back: Normal range of motion and neck supple.     Thoracic back: Tenderness present.  Skin:    General: Skin is warm and dry.  Neurological:     General: No focal deficit present.     Mental Status: He is alert.  Psychiatric:        Mood and Affect: Mood normal.        Behavior: Behavior normal.      Consultants:  Neurosurgery   Procedures:    Data Reviewed: Results for orders placed or performed during the hospital encounter of 03/28/23 (from the past 24 hour(s))  CBC     Status: Abnormal   Collection Time: 03/30/23  7:24 AM  Result Value Ref Range   WBC 5.9 4.0 - 10.5 K/uL   RBC 3.57 (L) 4.22 - 5.81 MIL/uL   Hemoglobin 11.9 (L) 13.0 - 17.0 g/dL   HCT 64.4 (L) 03.4 - 74.2 %   MCV 97.8 80.0 - 100.0 fL   MCH 33.3 26.0 - 34.0 pg   MCHC 34.1 30.0 - 36.0 g/dL   RDW 59.5 63.8 - 75.6 %   Platelets 341 150 - 400 K/uL   nRBC 0.0 0.0 - 0.2 %  Basic metabolic panel     Status: Abnormal   Collection Time: 03/30/23  7:24 AM  Result Value Ref Range   Sodium 136 135 - 145 mmol/L   Potassium 3.7 3.5 - 5.1 mmol/L   Chloride 100 98 - 111 mmol/L   CO2 27 22 - 32 mmol/L   Glucose, Bld 91 70 - 99 mg/dL   BUN 17 8 - 23 mg/dL   Creatinine, Ser 4.33 0.61 - 1.24 mg/dL   Calcium 8.8 (L) 8.9 - 10.3 mg/dL   GFR, Estimated >29 >51 mL/min   Anion gap 9 5 - 15    I have reviewed pertinent nursing notes, vitals, labs, and images as necessary. I have ordered labwork to follow up on as indicated.   I have reviewed the last notes from staff over past 24 hours. I have discussed patient's care plan and test results with nursing staff, CM/SW, and other staff as appropriate.  Time spent: Greater than 50% of the 55 minute visit was spent in counseling/coordination of care for the patient as laid out in the A&P.   LOS: 2 days   Lewie Chamber, MD [facility] 03/30/2023, 4:37 PM

## 2023-03-30 NOTE — Progress Notes (Signed)
Subjective: Patient reports patient feels much better this morning than yesterday evening and/or early this morning prior to the CT scan.  Leg pain and numbness is improved.  Objective: Vital signs in last 24 hours: Temp:  [98.3 F (36.8 C)-98.8 F (37.1 C)] 98.3 F (36.8 C) (09/25 0812) Pulse Rate:  [63-74] 63 (09/25 0812) Resp:  [18-19] 18 (09/25 0812) BP: (124-165)/(77-90) 164/90 (09/25 0812) SpO2:  [95 %-98 %] 98 % (09/25 0812) Weight:  [81.1 kg] 81.1 kg (09/25 0520)  Intake/Output from previous day: 09/24 0701 - 09/25 0700 In: 118 [P.O.:118] Out: -  Intake/Output this shift: No intake/output data recorded.  Strength is 5-5 slight decrease sensation diffusely bilateral lower extremities  Lab Results: Recent Labs    03/28/23 1800 03/30/23 0724  WBC 4.8 5.9  HGB 12.8* 11.9*  HCT 38.0* 34.9*  PLT 381 341   BMET Recent Labs    03/28/23 1800 03/30/23 0724  NA 136 136  K 3.8 3.7  CL 100 100  CO2 28 27  GLUCOSE 105* 91  BUN 15 17  CREATININE 0.83 0.79  CALCIUM 8.9 8.8*    Studies/Results: CT THORACIC SPINE WO CONTRAST  Result Date: 03/30/2023 CLINICAL DATA:  Thoracic compression fracture.  Known malignancy. EXAM: CT THORACIC SPINE WITHOUT CONTRAST TECHNIQUE: Multidetector CT images of the thoracic were obtained using the standard protocol without intravenous contrast. RADIATION DOSE REDUCTION: This exam was performed according to the departmental dose-optimization program which includes automated exposure control, adjustment of the mA and/or kV according to patient size and/or use of iterative reconstruction technique. COMPARISON:  Thoracic spine MRI 1 day prior FINDINGS: Alignment: Normal. Vertebrae: Again seen is marked compression deformity of the T3 vertebral body with near-complete loss of vertebral body height and 4 mm bony retropulsion consistent with pathologic fracture in the setting of osseous metastatic disease. There is also a nondisplaced fracture through  the right T3 lamina and transverse process extending to the inferior articulating process (5-23, 7-37). Also again seen is the more mild pathologic compression fracture of the T4 vertebral body with approximately 40% loss of vertebral body height anteriorly and no bony retropulsion. There is a nondisplaced fracture of the right T4 transverse process (5-31, 7-32). The other vertebral body heights are preserved. The bones are diffusely demineralized. Known additional metastatic lesions as well as epidural tumor at T3 and T4 are better seen on the thoracic spine MRI from 1 day prior. Paraspinal and other soft tissues: Nodular scarring in the medial right lung apex is unchanged. A probable sebaceous cyst is noted in the subcutaneous fat just to the left of midline at the T1 level. A large hiatal hernia is unchanged. Disc levels: There is overall mild underlying degenerative change throughout the thoracic spine. Known severe spinal canal stenosis at T3 is better seen on the MRI. IMPRESSION: 1. Pathologic compression fractures of the T3 and T4 vertebral bodies are similar to the prior MRI, worse at T3 where there is essentially complete loss of vertebral body height and bony retropulsion. Known severe spinal canal stenosis due to the retropulsion and epidural tumor is better seen on the prior MRI. 2. Additional nondisplaced fractures of the right T3 lamina, transverse process, and inferior articulating process, and right T4 transverse process. Electronically Signed   By: Lesia Hausen M.D.   On: 03/30/2023 13:50   MR THORACIC SPINE W WO CONTRAST  Result Date: 03/30/2023 CLINICAL DATA:  Initial evaluation for musculoskeletal neoplasm, history of prostate cancer. EXAM: MRI THORACIC WITHOUT AND  WITH CONTRAST TECHNIQUE: Multiplanar and multiecho pulse sequences of the thoracic spine were obtained without and with intravenous contrast. CONTRAST:  9mL GADAVIST GADOBUTROL 1 MMOL/ML IV SOLN COMPARISON:  Prior study from  12/09/2022. FINDINGS: Alignment: Vertebral bodies normally aligned with preservation of the normal thoracic kyphosis. Trace degenerative retrolisthesis of L1 on L2 noted. Vertebrae: Osseous metastasis involving the T3 vertebral body is seen. Associated pathologic compression fracture with vertebral plana formation and up to 6 mm of bony retropulsion. Additional osseous metastasis involves the entirety of the T4 vertebral body. Associated 40% height loss without significant bony retropulsion. Tumor extends to involve the posterior elements bilaterally. Epidural extension of tumor at these levels with tumor within the ventral epidural space, most pronounced at T3 where there is resultant severe spinal stenosis and cord compression (series 10, image 17). Secondary cord flattening with suspected early/mild cord signal changes (series 4, image 13). Thecal sac measures approximately 4-5 mm in AP diameter. No more than mild stenosis inferiorly at the level of T4. Moderate to severe bilateral foraminal narrowing noted at T2-3 and T3-4 as well. Multiple additional smaller osseous metastases seen scattered elsewhere throughout the thoracic spine. For reference purposes, the largest additional lesion involves the inferior aspect of T2 and measures 1.5 cm (series 5, image 12). No other significant extra osseous or epidural extension of tumor. No other pathologic compression fracture. Few additional scattered osseous metastases noted within the cervical spine on counter sequence. No visible extra osseous or epidural extension of tumor within the cervical spine itself. Cord: Cord impingement with suspected subtle early cord signal changes at the level of T3 as above. Otherwise normal signal and morphology. Paraspinal and other soft tissues: Paraspinous soft tissues demonstrate no acute finding. Small layering left pleural effusion noted. Disc levels: Underlying mild for age multilevel thoracic spondylosis elsewhere throughout the  thoracic spine. Scattered superimposed multilevel facet disease. No other significant spinal stenosis. Mild multilevel foraminal narrowing noted within the lower thoracic spine at T10-11 bilaterally and T12-L1 on the left. IMPRESSION: 1. Osseous metastases involving the T3 and T4 vertebral bodies with associated pathologic compression fractures as above. Epidural extension of tumor at these levels with resultant severe spinal stenosis and cord compression at the level of T3. Probable subtle early/mild cord signal changes at this level. 2. Multiple additional smaller osseous metastases scattered elsewhere throughout the thoracic spine. No other significant extra osseous or epidural extension of tumor. 3. Underlying mild for age thoracic spondylosis without other high-grade spinal stenosis. Mild multilevel foraminal narrowing within the lower thoracic spine as above. 4. Small layering left pleural effusion. Electronically Signed   By: Rise Mu M.D.   On: 03/30/2023 04:25    Assessment/Plan: 67 year old gentleman with metastatic presumptive prostate cancer to T3 pathologic fracture and epidural tumor.  Does have significant cord compression from the epidural tumor neurologic Sam is actually slightly improved.  However I do think based on MRI scan and CT scan it does need to be decompressed.  I do not think he necessarily has to be fused.  And I think the follow-up adjuvant treatment potentially with stereotactic radiosurgery will be better without hardware as artifact in the planning.  So I recommended decompressive thoracic laminectomy for resection of epidural tumor I have extensively gone over the risks and benefits of the operation with the patient as well as perioperative course expectations of outcome and alternatives to surgery and he understands and agrees to proceed forward we actively scheduling this for 1:00 tomorrow afternoon will make the  patient n.p.o. after midnight resume regular diet  today.  I did explain to him that there was a risk of this developing some instability over I think that risk is low as the rib cage and sternum tend to stabilize this area fairly well.  But that it is possible that we may have to go back in and fused him down the road.  LOS: 2 days     Mariam Dollar 03/30/2023, 2:47 PM

## 2023-03-30 NOTE — Assessment & Plan Note (Signed)
Continue Xanax and Lexapro

## 2023-03-30 NOTE — Progress Notes (Signed)
Outpatient restaging PSMA PET ordered.   Marisue Ivan, would you please follow this patient's case? Thank you

## 2023-03-31 ENCOUNTER — Inpatient Hospital Stay (HOSPITAL_COMMUNITY): Payer: Medicare Other | Admitting: Certified Registered"

## 2023-03-31 ENCOUNTER — Encounter (HOSPITAL_COMMUNITY): Payer: Self-pay | Admitting: Internal Medicine

## 2023-03-31 ENCOUNTER — Encounter (HOSPITAL_COMMUNITY)
Admission: EM | Disposition: A | Discharge status: Rehabilitation Facility | Payer: Self-pay | Source: Home / Self Care | Attending: Internal Medicine

## 2023-03-31 ENCOUNTER — Inpatient Hospital Stay (HOSPITAL_COMMUNITY): Payer: Medicare Other

## 2023-03-31 ENCOUNTER — Other Ambulatory Visit: Payer: Self-pay

## 2023-03-31 DIAGNOSIS — C7951 Secondary malignant neoplasm of bone: Secondary | ICD-10-CM

## 2023-03-31 DIAGNOSIS — C7949 Secondary malignant neoplasm of other parts of nervous system: Secondary | ICD-10-CM | POA: Diagnosis present

## 2023-03-31 DIAGNOSIS — C61 Malignant neoplasm of prostate: Secondary | ICD-10-CM | POA: Diagnosis not present

## 2023-03-31 DIAGNOSIS — M4854XA Collapsed vertebra, not elsewhere classified, thoracic region, initial encounter for fracture: Secondary | ICD-10-CM | POA: Diagnosis not present

## 2023-03-31 HISTORY — PX: LAMINECTOMY: SHX219

## 2023-03-31 LAB — SURGICAL PCR SCREEN
MRSA, PCR: NEGATIVE
Staphylococcus aureus: POSITIVE — AB

## 2023-03-31 SURGERY — THORACIC LAMINECTOMY FOR TUMOR
Anesthesia: General | Site: Spine Thoracic

## 2023-03-31 MED ORDER — VASOPRESSIN 20 UNIT/ML IV SOLN
INTRAVENOUS | Status: AC
  Filled 2023-03-31: qty 1

## 2023-03-31 MED ORDER — EPHEDRINE SULFATE-NACL 50-0.9 MG/10ML-% IV SOSY
PREFILLED_SYRINGE | INTRAVENOUS | Status: DC | PRN
  Administered 2023-03-31: 15 mg via INTRAVENOUS

## 2023-03-31 MED ORDER — PROPOFOL 10 MG/ML IV BOLUS
INTRAVENOUS | Status: DC | PRN
Start: 2023-03-31 — End: 2023-03-31
  Administered 2023-03-31: 20 mg via INTRAVENOUS
  Administered 2023-03-31: 150 mg via INTRAVENOUS

## 2023-03-31 MED ORDER — VASOPRESSIN 20 UNIT/ML IV SOLN
INTRAVENOUS | Status: DC | PRN
  Administered 2023-03-31: .5 [IU] via INTRAVENOUS

## 2023-03-31 MED ORDER — THROMBIN 20000 UNITS EX SOLR: CUTANEOUS | Status: DC | PRN

## 2023-03-31 MED ORDER — PHENOL 1.4 % MT LIQD: 1.0000 | OROMUCOSAL | Status: DC | PRN

## 2023-03-31 MED ORDER — SODIUM CHLORIDE 0.9 % IV SOLN
INTRAVENOUS | Status: DC | PRN
Start: 2023-03-31 — End: 2023-03-31

## 2023-03-31 MED ORDER — HYDROMORPHONE HCL 1 MG/ML IJ SOLN
1.0000 mg | Freq: Once | INTRAMUSCULAR | Status: AC
  Administered 2023-03-31: 1 mg via INTRAVENOUS

## 2023-03-31 MED ORDER — LIDOCAINE-EPINEPHRINE 1 %-1:100000 IJ SOLN
INTRAMUSCULAR | Status: AC
  Filled 2023-03-31: qty 1

## 2023-03-31 MED ORDER — LIDOCAINE-EPINEPHRINE 1 %-1:100000 IJ SOLN
INTRAMUSCULAR | Status: DC | PRN
  Administered 2023-03-31: 10 mL

## 2023-03-31 MED ORDER — OXYCODONE HCL 5 MG/5ML PO SOLN: 5.0000 mg | Freq: Once | ORAL | Status: AC | PRN

## 2023-03-31 MED ORDER — DEXAMETHASONE SODIUM PHOSPHATE 10 MG/ML IJ SOLN
INTRAMUSCULAR | Status: DC | PRN
  Administered 2023-03-31: 10 mg via INTRAVENOUS

## 2023-03-31 MED ORDER — EPHEDRINE 5 MG/ML INJ
INTRAVENOUS | Status: AC
  Filled 2023-03-31: qty 10

## 2023-03-31 MED ORDER — ACETAMINOPHEN 10 MG/ML IV SOLN
INTRAVENOUS | Status: AC
  Filled 2023-03-31: qty 100

## 2023-03-31 MED ORDER — ORAL CARE MOUTH RINSE: 15.0000 mL | Freq: Once | OROMUCOSAL | Status: AC

## 2023-03-31 MED ORDER — ACETAMINOPHEN 650 MG RE SUPP: 650.0000 mg | RECTAL | Status: DC | PRN

## 2023-03-31 MED ORDER — ONDANSETRON HCL 4 MG PO TABS: 4.0000 mg | ORAL_TABLET | Freq: Four times a day (QID) | ORAL | Status: DC | PRN

## 2023-03-31 MED ORDER — FENTANYL CITRATE (PF) 100 MCG/2ML IJ SOLN
INTRAMUSCULAR | Status: AC
  Filled 2023-03-31: qty 2

## 2023-03-31 MED ORDER — CYCLOBENZAPRINE HCL 10 MG PO TABS
10.0000 mg | ORAL_TABLET | Freq: Three times a day (TID) | ORAL | Status: DC | PRN
  Administered 2023-03-31 – 2023-04-13 (×19): 10 mg via ORAL
  Filled 2023-03-31 (×19): qty 1

## 2023-03-31 MED ORDER — HYDROMORPHONE HCL 1 MG/ML IJ SOLN: 0.5000 mg | INTRAMUSCULAR | Status: DC | PRN

## 2023-03-31 MED ORDER — CEFAZOLIN SODIUM-DEXTROSE 2-4 GM/100ML-% IV SOLN
2.0000 g | Freq: Three times a day (TID) | INTRAVENOUS | Status: AC
  Administered 2023-03-31 – 2023-04-01 (×2): 2 g via INTRAVENOUS
  Filled 2023-03-31 (×2): qty 100

## 2023-03-31 MED ORDER — HYDROCODONE-ACETAMINOPHEN 5-325 MG PO TABS
2.0000 | ORAL_TABLET | ORAL | Status: DC | PRN
  Administered 2023-03-31 – 2023-04-01 (×4): 2 via ORAL
  Filled 2023-03-31 (×4): qty 2

## 2023-03-31 MED ORDER — PANTOPRAZOLE SODIUM 40 MG IV SOLR
40.0000 mg | Freq: Every day | INTRAVENOUS | Status: DC
  Administered 2023-03-31: 40 mg via INTRAVENOUS
  Filled 2023-03-31: qty 10

## 2023-03-31 MED ORDER — ROCURONIUM BROMIDE 10 MG/ML (PF) SYRINGE
PREFILLED_SYRINGE | INTRAVENOUS | Status: DC | PRN
  Administered 2023-03-31: 50 mg via INTRAVENOUS
  Administered 2023-03-31: 10 mg via INTRAVENOUS

## 2023-03-31 MED ORDER — FENTANYL CITRATE (PF) 250 MCG/5ML IJ SOLN
INTRAMUSCULAR | Status: DC | PRN
  Administered 2023-03-31 (×2): 50 ug via INTRAVENOUS

## 2023-03-31 MED ORDER — BUPIVACAINE HCL (PF) 0.25 % IJ SOLN
INTRAMUSCULAR | Status: DC | PRN
  Administered 2023-03-31: 10 mL

## 2023-03-31 MED ORDER — THROMBIN 5000 UNITS EX SOLR
CUTANEOUS | Status: AC
  Filled 2023-03-31: qty 5000

## 2023-03-31 MED ORDER — PHENYLEPHRINE 80 MCG/ML (10ML) SYRINGE FOR IV PUSH (FOR BLOOD PRESSURE SUPPORT)
PREFILLED_SYRINGE | INTRAVENOUS | Status: DC | PRN
  Administered 2023-03-31 (×2): 400 ug via INTRAVENOUS

## 2023-03-31 MED ORDER — ACETAMINOPHEN 325 MG PO TABS: 650.0000 mg | ORAL_TABLET | ORAL | Status: DC | PRN

## 2023-03-31 MED ORDER — BACITRACIN ZINC 500 UNIT/GM EX OINT
TOPICAL_OINTMENT | CUTANEOUS | Status: DC | PRN
  Administered 2023-03-31: 1 via TOPICAL

## 2023-03-31 MED ORDER — THROMBIN 5000 UNITS EX SOLR: OROMUCOSAL | Status: DC | PRN

## 2023-03-31 MED ORDER — MIDAZOLAM HCL 2 MG/2ML IJ SOLN
INTRAMUSCULAR | Status: AC
  Filled 2023-03-31: qty 2

## 2023-03-31 MED ORDER — PHENYLEPHRINE 80 MCG/ML (10ML) SYRINGE FOR IV PUSH (FOR BLOOD PRESSURE SUPPORT)
PREFILLED_SYRINGE | INTRAVENOUS | Status: AC
  Filled 2023-03-31: qty 30

## 2023-03-31 MED ORDER — FENTANYL CITRATE (PF) 250 MCG/5ML IJ SOLN
INTRAMUSCULAR | Status: AC
  Filled 2023-03-31: qty 5

## 2023-03-31 MED ORDER — BACITRACIN ZINC 500 UNIT/GM EX OINT
TOPICAL_OINTMENT | CUTANEOUS | Status: AC
  Filled 2023-03-31: qty 28.35

## 2023-03-31 MED ORDER — ACETAMINOPHEN 10 MG/ML IV SOLN
INTRAVENOUS | Status: DC | PRN
Start: 2023-03-31 — End: 2023-03-31
  Administered 2023-03-31: 1000 mg via INTRAVENOUS

## 2023-03-31 MED ORDER — MIDAZOLAM HCL 2 MG/2ML IJ SOLN
INTRAMUSCULAR | Status: DC | PRN
  Administered 2023-03-31: 2 mg via INTRAVENOUS

## 2023-03-31 MED ORDER — LIDOCAINE 2% (20 MG/ML) 5 ML SYRINGE
INTRAMUSCULAR | Status: AC
  Filled 2023-03-31: qty 5

## 2023-03-31 MED ORDER — THROMBIN 20000 UNITS EX SOLR
CUTANEOUS | Status: AC
  Filled 2023-03-31: qty 20000

## 2023-03-31 MED ORDER — ONDANSETRON HCL 4 MG/2ML IJ SOLN: 4.0000 mg | Freq: Once | INTRAMUSCULAR | Status: DC | PRN

## 2023-03-31 MED ORDER — BUPIVACAINE HCL (PF) 0.25 % IJ SOLN
INTRAMUSCULAR | Status: AC
  Filled 2023-03-31: qty 30

## 2023-03-31 MED ORDER — FENTANYL CITRATE (PF) 100 MCG/2ML IJ SOLN
25.0000 ug | INTRAMUSCULAR | Status: DC | PRN
  Administered 2023-03-31 (×3): 50 ug via INTRAVENOUS

## 2023-03-31 MED ORDER — ROCURONIUM BROMIDE 10 MG/ML (PF) SYRINGE
PREFILLED_SYRINGE | INTRAVENOUS | Status: AC
  Filled 2023-03-31: qty 10

## 2023-03-31 MED ORDER — CHLORHEXIDINE GLUCONATE 0.12 % MT SOLN
15.0000 mL | Freq: Once | OROMUCOSAL | Status: AC
  Administered 2023-03-31: 15 mL via OROMUCOSAL
  Filled 2023-03-31: qty 15

## 2023-03-31 MED ORDER — SODIUM CHLORIDE 0.9% FLUSH
3.0000 mL | Freq: Two times a day (BID) | INTRAVENOUS | Status: DC
  Administered 2023-03-31 – 2023-04-07 (×13): 3 mL via INTRAVENOUS

## 2023-03-31 MED ORDER — HYDROMORPHONE HCL 1 MG/ML IJ SOLN
1.0000 mg | Freq: Once | INTRAMUSCULAR | Status: DC
Start: 2023-03-31 — End: 2023-03-31

## 2023-03-31 MED ORDER — SODIUM CHLORIDE 0.9% FLUSH: 3.0000 mL | INTRAVENOUS | Status: DC | PRN

## 2023-03-31 MED ORDER — LIDOCAINE 2% (20 MG/ML) 5 ML SYRINGE
INTRAMUSCULAR | Status: DC | PRN
  Administered 2023-03-31: 60 mg via INTRAVENOUS

## 2023-03-31 MED ORDER — ONDANSETRON HCL 4 MG/2ML IJ SOLN: 4.0000 mg | Freq: Four times a day (QID) | INTRAMUSCULAR | Status: DC | PRN

## 2023-03-31 MED ORDER — CEFAZOLIN SODIUM-DEXTROSE 2-4 GM/100ML-% IV SOLN
2.0000 g | Freq: Once | INTRAVENOUS | Status: AC
  Administered 2023-03-31: 2 g via INTRAVENOUS
  Filled 2023-03-31: qty 100

## 2023-03-31 MED ORDER — OXYCODONE HCL 5 MG PO TABS
ORAL_TABLET | ORAL | Status: AC
  Filled 2023-03-31: qty 1

## 2023-03-31 MED ORDER — OXYCODONE HCL 5 MG PO TABS
5.0000 mg | ORAL_TABLET | Freq: Once | ORAL | Status: AC | PRN
  Administered 2023-03-31: 5 mg via ORAL

## 2023-03-31 MED ORDER — ACETAMINOPHEN 500 MG PO TABS: 1000.0000 mg | ORAL_TABLET | Freq: Once | ORAL | Status: DC

## 2023-03-31 MED ORDER — ALUM & MAG HYDROXIDE-SIMETH 200-200-20 MG/5ML PO SUSP: 30.0000 mL | Freq: Four times a day (QID) | ORAL | Status: DC | PRN

## 2023-03-31 MED ORDER — ALBUMIN HUMAN 5 % IV SOLN: INTRAVENOUS | Status: DC | PRN

## 2023-03-31 MED ORDER — SODIUM CHLORIDE 0.9 % IV SOLN: 250.0000 mL | INTRAVENOUS | Status: DC

## 2023-03-31 MED ORDER — LACTATED RINGERS IV SOLN: INTRAVENOUS | Status: DC

## 2023-03-31 MED ORDER — PHENYLEPHRINE HCL-NACL 20-0.9 MG/250ML-% IV SOLN
INTRAVENOUS | Status: DC | PRN
  Administered 2023-03-31: 30 ug/min via INTRAVENOUS

## 2023-03-31 MED ORDER — MENTHOL 3 MG MT LOZG: 1.0000 | LOZENGE | OROMUCOSAL | Status: DC | PRN

## 2023-03-31 MED ORDER — SUGAMMADEX SODIUM 200 MG/2ML IV SOLN
INTRAVENOUS | Status: DC | PRN
  Administered 2023-03-31: 200 mg via INTRAVENOUS
  Administered 2023-03-31: 100 mg via INTRAVENOUS

## 2023-03-31 MED ORDER — ONDANSETRON HCL 4 MG/2ML IJ SOLN
INTRAMUSCULAR | Status: DC | PRN
Start: 2023-03-31 — End: 2023-03-31
  Administered 2023-03-31: 4 mg via INTRAVENOUS

## 2023-03-31 SURGICAL SUPPLY — 53 items
ADH SKN CLS APL DERMABOND .7 (GAUZE/BANDAGES/DRESSINGS) ×1
APL SKNCLS STERI-STRIP NONHPOA (GAUZE/BANDAGES/DRESSINGS) ×1
BAG COUNTER SPONGE SURGICOUNT (BAG) ×2 IMPLANT
BAG SPNG CNTER NS LX DISP (BAG) ×1
BENZOIN TINCTURE PRP APPL 2/3 (GAUZE/BANDAGES/DRESSINGS) ×2 IMPLANT
BLADE CLIPPER SURG (BLADE) IMPLANT
BLADE SURG 11 STRL SS (BLADE) ×2 IMPLANT
BUR MATCHSTICK NEURO 3.0 LAGG (BURR) IMPLANT
BUR PRECISION FLUTE 6.0 (BURR) IMPLANT
CANISTER SUCT 3000ML PPV (MISCELLANEOUS) ×2 IMPLANT
DERMABOND ADVANCED .7 DNX12 (GAUZE/BANDAGES/DRESSINGS) ×2 IMPLANT
DRAPE LAPAROTOMY 100X72 PEDS (DRAPES) IMPLANT
DRAPE LAPAROTOMY 100X72X124 (DRAPES) ×2 IMPLANT
DRAPE MICROSCOPE SLANT 54X150 (MISCELLANEOUS) ×2 IMPLANT
DRAPE SURG 17X23 STRL (DRAPES) ×4 IMPLANT
DRSG OPSITE POSTOP 4X6 (GAUZE/BANDAGES/DRESSINGS) IMPLANT
ELECT REM PT RETURN 9FT ADLT (ELECTROSURGICAL) ×1 IMPLANT
ELECTRODE REM PT RTRN 9FT ADLT (ELECTROSURGICAL) ×2 IMPLANT
GAUZE 4X4 16PLY ~~LOC~~+RFID DBL (SPONGE) IMPLANT
GAUZE SPONGE 4X4 12PLY STRL (GAUZE/BANDAGES/DRESSINGS) ×2 IMPLANT
GLOVE BIO SURGEON STRL SZ7 (GLOVE) IMPLANT
GLOVE BIO SURGEON STRL SZ8 (GLOVE) ×2 IMPLANT
GLOVE BIOGEL PI IND STRL 7.0 (GLOVE) IMPLANT
GLOVE EXAM NITRILE XL STR (GLOVE) IMPLANT
GLOVE INDICATOR 8.5 STRL (GLOVE) ×4 IMPLANT
GOWN STRL REUS W/ TWL LRG LVL3 (GOWN DISPOSABLE) ×2 IMPLANT
GOWN STRL REUS W/ TWL XL LVL3 (GOWN DISPOSABLE) ×2 IMPLANT
GOWN STRL REUS W/TWL 2XL LVL3 (GOWN DISPOSABLE) ×2 IMPLANT
GOWN STRL REUS W/TWL LRG LVL3 (GOWN DISPOSABLE) ×1
GOWN STRL REUS W/TWL XL LVL3 (GOWN DISPOSABLE) ×1
KIT BASIN OR (CUSTOM PROCEDURE TRAY) ×2 IMPLANT
KIT TURNOVER KIT B (KITS) ×2 IMPLANT
NDL HYPO 22X1.5 SAFETY MO (MISCELLANEOUS) ×2 IMPLANT
NDL HYPO 25X1 1.5 SAFETY (NEEDLE) ×2 IMPLANT
NDL SPNL 20GX3.5 QUINCKE YW (NEEDLE) IMPLANT
NEEDLE HYPO 22X1.5 SAFETY MO (MISCELLANEOUS) ×1 IMPLANT
NEEDLE HYPO 25X1 1.5 SAFETY (NEEDLE) ×1 IMPLANT
NEEDLE SPNL 20GX3.5 QUINCKE YW (NEEDLE) IMPLANT
NS IRRIG 1000ML POUR BTL (IV SOLUTION) ×2 IMPLANT
PACK LAMINECTOMY NEURO (CUSTOM PROCEDURE TRAY) ×2 IMPLANT
PATTIES SURGICAL .5 X.5 (GAUZE/BANDAGES/DRESSINGS) IMPLANT
PATTIES SURGICAL .5 X3 (DISPOSABLE) ×2 IMPLANT
SPIKE FLUID TRANSFER (MISCELLANEOUS) ×2 IMPLANT
SPONGE SURGIFOAM ABS GEL 100 (HEMOSTASIS) ×2 IMPLANT
SPONGE T-LAP 4X18 ~~LOC~~+RFID (SPONGE) IMPLANT
STAPLER VISISTAT 35W (STAPLE) ×2 IMPLANT
STRIP CLOSURE SKIN 1/2X4 (GAUZE/BANDAGES/DRESSINGS) ×2 IMPLANT
SUT ETHILON 4 0 PS 2 18 (SUTURE) ×2 IMPLANT
SUT VIC AB 2-0 CT1 18 (SUTURE) ×2 IMPLANT
SUT VIC AB 3-0 SH 8-18 (SUTURE) IMPLANT
TOWEL GREEN STERILE (TOWEL DISPOSABLE) ×2 IMPLANT
TOWEL GREEN STERILE FF (TOWEL DISPOSABLE) ×2 IMPLANT
WATER STERILE IRR 1000ML POUR (IV SOLUTION) ×2 IMPLANT

## 2023-03-31 NOTE — Anesthesia Procedure Notes (Signed)
Procedure Name: Intubation Date/Time: 03/31/2023 2:15 PM  Performed by: Alwyn Ren, CRNAPre-anesthesia Checklist: Patient identified, Emergency Drugs available, Suction available and Patient being monitored Patient Re-evaluated:Patient Re-evaluated prior to induction Oxygen Delivery Method: Circle system utilized Preoxygenation: Pre-oxygenation with 100% oxygen Induction Type: IV induction Ventilation: Mask ventilation without difficulty Laryngoscope Size: Miller and 2 Grade View: Grade I Tube type: Oral Tube size: 7.0 mm Number of attempts: 1 Airway Equipment and Method: Stylet and Oral airway Placement Confirmation: ETT inserted through vocal cords under direct vision, positive ETCO2 and breath sounds checked- equal and bilateral Secured at: 22 cm Tube secured with: Tape Dental Injury: Teeth and Oropharynx as per pre-operative assessment

## 2023-03-31 NOTE — Transfer of Care (Signed)
Immediate Anesthesia Transfer of Care Note  Patient: Nathan Berg  Procedure(s) Performed: THORACIC LAMINECTOMY FOR TUMOR (Spine Thoracic)  Patient Location: PACU  Anesthesia Type:General  Level of Consciousness: awake, drowsy, patient cooperative, and responds to stimulation  Airway & Oxygen Therapy: Patient Spontanous Breathing and Patient connected to face mask oxygen  Post-op Assessment: Report given to RN and Post -op Vital signs reviewed and stable  Post vital signs: Reviewed and stable  Last Vitals:  Vitals Value Taken Time  BP    Temp    Pulse 68 03/31/23 1628  Resp 15 03/31/23 1628  SpO2 99 % 03/31/23 1628  Vitals shown include unfiled device data.  Last Pain:  Vitals:   03/31/23 1244  TempSrc: Oral  PainSc:       Patients Stated Pain Goal: 0 (03/30/23 1733)  Complications: No notable events documented.

## 2023-03-31 NOTE — Plan of Care (Signed)

## 2023-03-31 NOTE — Progress Notes (Signed)
Progress Note    Nathan Berg   WUJ:[id number]  DOB: [birth year]  DOA: 03/28/2023     3 PCP: Malachy Mood, MD  Initial CC: Back pain  Hospital Course: Nathan Berg is a 67 yo male with PMH metastatic prostate cancer to the bone, GAD who presented with worsening back pain. He had undergone outpatient workup with [facility] on 03/25/2023 with MRI T-spine which showed concern for new metastatic disease involving thoracic spine with concern for cord compression.  He was referred to the ER. MRI thoracic spine repeated on admission and evaluated by surgery.  He was also started on steroids on admission.  Interval History:  No events overnight.  Seen this morning prior to going down for surgery.  No neurologic changes since yesterday.  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 Epidural extension of tumor at these levels with resultant severe spinal stenosis and cord compression at the level of T3. Probable subtle early/mild cord signal changes at this level. 2. Multiple additional smaller osseous metastases scattered elsewhere throughout the thoracic spine. 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 Mosetta Putt; transitioning back to [facility]  GAD (generalized anxiety disorder) - Continue Xanax and Lexapro    Old records reviewed in assessment of this patient  Antimicrobials:   DVT prophylaxis:  SCDs Start:  03/28/23 1958   Code Status:   Code Status: Limited: Do not attempt resuscitation (DNR) -DNR-LIMITED -Do Not Intubate/DNI   Mobility Assessment (Last 72 Hours)     Mobility Assessment     Row Name 03/31/23 0900 03/31/23 0739 03/30/23 1935 03/30/23 1200 03/30/23 0800   Does patient have an order for bedrest or is patient medically unstable No - Continue assessment No - Continue assessment No - Continue assessment No - Continue assessment No - Continue assessment   What is the highest level of mobility based on the progressive mobility assessment? Level 5 (Walks with assist in room/hall) - Balance while stepping forward/back and can walk in room with assist - Complete Level 5 (Walks with assist in room/hall) - Balance while stepping forward/back and can walk in room with assist - Complete Level 5 (Walks with assist in room/hall) - Balance while stepping forward/back and can walk in room with assist - Complete Level 5 (Walks with assist in room/hall) - Balance while stepping forward/back and can walk in room with assist - Complete Level 5 (Walks with assist in room/hall) - Balance while stepping forward/back and can walk in room with assist - Complete    Row Name 03/29/23 2125 03/29/23 1647 03/29/23 1644 03/29/23 0800 03/29/23 0200   Does patient have an order for bedrest or is patient medically unstable No - Continue assessment No - Continue assessment No - Continue assessment Yes- Bedfast (Level 1) - Complete Yes- Bedfast (Level 1) - Complete   What is the highest level of mobility based on the progressive mobility assessment? Level 5 (Walks with assist in room/hall) - Balance while stepping forward/back and can walk in room with  assist - Complete Level 5 (Walks with assist in room/hall) - Balance while stepping forward/back and can walk in room with assist - Complete Level 5 (Walks with assist in room/hall) - Balance while stepping forward/back and can walk in room with assist - Complete -- --             Barriers to discharge: none Disposition Plan:  Home Status is: Inpt  Objective: Blood pressure (!) 146/82, pulse 61, temperature 98.2 F (36.8 C), temperature source Oral, resp. rate 18, height 5\' 10"  (1.778 m), weight 81.1 kg, SpO2 96%.  Examination:  Physical Exam Constitutional:      General: He is not in acute distress.    Appearance: Normal appearance.  HENT:     Head: Normocephalic and atraumatic.     Mouth/Throat:     Mouth: Mucous membranes are moist.  Eyes:     Extraocular Movements: Extraocular movements intact.  Cardiovascular:     Rate and Rhythm: Normal rate and regular rhythm.  Pulmonary:     Effort: Pulmonary effort is normal. No respiratory distress.     Breath sounds: Normal breath sounds. No wheezing.  Abdominal:     General: Bowel sounds are normal. There is no distension.     Palpations: Abdomen is soft.     Tenderness: There is no abdominal tenderness.  Musculoskeletal:        General: Normal range of motion.     Cervical back: Normal range of motion and neck supple.     Thoracic back: Tenderness present.  Skin:    General: Skin is warm and dry.  Neurological:     General: No focal deficit present.     Mental Status: He is alert.  Psychiatric:        Mood and Affect: Mood normal.        Behavior: Behavior normal.      Consultants:  Neurosurgery   Procedures:    Data Reviewed: Results for orders placed or performed during the hospital encounter of 03/28/23 (from the past 24 hour(s))  Type and screen [facility]     Status: None   Collection Time: 03/30/23  5:44 PM  Result Value Ref Range   ABO/RH(D) O POS    Antibody Screen NEG    Sample Expiration      04/02/2023,2359 Performed at [facility], [address], [place], [place] [address]     I have reviewed pertinent nursing notes, vitals, labs, and images as necessary. I have ordered labwork to follow up on as indicated.  I have reviewed the last  notes from staff over past 24 hours. I have discussed patient's care plan and test results with nursing staff, CM/SW, and other staff as appropriate.    LOS: 3 days   Lewie Chamber, MD [facility] 03/31/2023, 9:11 AM

## 2023-03-31 NOTE — Anesthesia Preprocedure Evaluation (Addendum)
Anesthesia Evaluation  Patient identified by MRN, date of birth, ID band Patient awake    Reviewed: Allergy & Precautions, NPO status , Patient's Chart, lab work & pertinent test results  History of Anesthesia Complications Negative for: history of anesthetic complications  Airway Mallampati: II  TM Distance: >3 FB Neck ROM: Full    Dental  (+) Dental Advisory Given, Teeth Intact   Pulmonary neg pulmonary ROS   Pulmonary exam normal        Cardiovascular negative cardio ROS Normal cardiovascular exam     Neuro/Psych  PSYCHIATRIC DISORDERS Anxiety Depression     Thoracic tumor     GI/Hepatic Neg liver ROS,GERD  Controlled,,  Endo/Other  negative endocrine ROS    Renal/GU negative Renal ROS    Prostate cancer     Musculoskeletal  (+) Arthritis ,   Bony mets    Abdominal   Peds  Hematology  (+) Blood dyscrasia, anemia   Anesthesia Other Findings   Reproductive/Obstetrics                              Anesthesia Physical Anesthesia Plan  ASA: 3  Anesthesia Plan: General   Post-op Pain Management: Tylenol PO (pre-op)* and Gabapentin PO (pre-op)*   Induction: Intravenous  PONV Risk Score and Plan: 2 and Treatment may vary due to age or medical condition, Ondansetron and Dexamethasone  Airway Management Planned: Oral ETT  Additional Equipment: None  Intra-op Plan:   Post-operative Plan: Extubation in OR  Informed Consent: I have reviewed the patients History and Physical, chart, labs and discussed the procedure including the risks, benefits and alternatives for the proposed anesthesia with the patient or authorized representative who has indicated his/her understanding and acceptance.   Patient has DNR.  Discussed DNR with patient and Suspend DNR.   Dental advisory given  Plan Discussed with: CRNA and Anesthesiologist  Anesthesia Plan Comments:          Anesthesia Quick Evaluation

## 2023-03-31 NOTE — Progress Notes (Signed)
Subjective: Patient reports  stable neurological exam lower extremities still improved from 2 days ago stable from yesterday  Objective: Vital signs in last 24 hours: Temp:  [97.6 F (36.4 C)-98.9 F (37.2 C)] 97.6 F (36.4 C) (09/26 1244) Pulse Rate:  [57-72] 57 (09/26 1244) Resp:  [17-18] 17 (09/26 1244) BP: (112-150)/(69-88) 137/85 (09/26 1244) SpO2:  [94 %-97 %] 94 % (09/26 1244) Weight:  [81.6 kg] 81.6 kg (09/26 1244)  Intake/Output from previous day: 09/25 0701 - 09/26 0700 In: -  Out: 150 [Urine:150] Intake/Output this shift: No intake/output data recorded.  Awake alert strength lower extremities 5 out of 5 iliopsoas, quads, hamstrings, gastrosoleus tibialis, and EHL.  Lab Results: Recent Labs    03/28/23 1800 03/30/23 0724  WBC 4.8 5.9  HGB 12.8* 11.9*  HCT 38.0* 34.9*  PLT 381 341   BMET Recent Labs    03/28/23 1800 03/30/23 0724  NA 136 136  K 3.8 3.7  CL 100 100  CO2 28 27  GLUCOSE 105* 91  BUN 15 17  CREATININE 0.83 0.79  CALCIUM 8.9 8.8*    Studies/Results: MR OUTSIDE FILMS SPINE  Result Date: 03/30/2023 This examination belongs to an outside facility and is stored here for comparison purposes only.  Contact the originating outside institution for any associated report or interpretation.  CT THORACIC SPINE WO CONTRAST  Result Date: 03/30/2023 CLINICAL DATA:  Thoracic compression fracture.  Known malignancy. EXAM: CT THORACIC SPINE WITHOUT CONTRAST TECHNIQUE: Multidetector CT images of the thoracic were obtained using the standard protocol without intravenous contrast. RADIATION DOSE REDUCTION: This exam was performed according to the departmental dose-optimization program which includes automated exposure control, adjustment of the mA and/or kV according to patient size and/or use of iterative reconstruction technique. COMPARISON:  Thoracic spine MRI 1 day prior FINDINGS: Alignment: Normal. Vertebrae: Again seen is marked compression deformity of  the T3 vertebral body with near-complete loss of vertebral body height and 4 mm bony retropulsion consistent with pathologic fracture in the setting of osseous metastatic disease. There is also a nondisplaced fracture through the right T3 lamina and transverse process extending to the inferior articulating process (5-23, 7-37). Also again seen is the more mild pathologic compression fracture of the T4 vertebral body with approximately 40% loss of vertebral body height anteriorly and no bony retropulsion. There is a nondisplaced fracture of the right T4 transverse process (5-31, 7-32). The other vertebral body heights are preserved. The bones are diffusely demineralized. Known additional metastatic lesions as well as epidural tumor at T3 and T4 are better seen on the thoracic spine MRI from 1 day prior. Paraspinal and other soft tissues: Nodular scarring in the medial right lung apex is unchanged. A probable sebaceous cyst is noted in the subcutaneous fat just to the left of midline at the T1 level. A large hiatal hernia is unchanged. Disc levels: There is overall mild underlying degenerative change throughout the thoracic spine. Known severe spinal canal stenosis at T3 is better seen on the MRI. IMPRESSION: 1. Pathologic compression fractures of the T3 and T4 vertebral bodies are similar to the prior MRI, worse at T3 where there is essentially complete loss of vertebral body height and bony retropulsion. Known severe spinal canal stenosis due to the retropulsion and epidural tumor is better seen on the prior MRI. 2. Additional nondisplaced fractures of the right T3 lamina, transverse process, and inferior articulating process, and right T4 transverse process. Electronically Signed   By: Selena Lesser.D.  On: 03/30/2023 13:50   MR THORACIC SPINE W WO CONTRAST  Result Date: 03/30/2023 CLINICAL DATA:  Initial evaluation for musculoskeletal neoplasm, history of prostate cancer. EXAM: MRI THORACIC WITHOUT AND WITH  CONTRAST TECHNIQUE: Multiplanar and multiecho pulse sequences of the thoracic spine were obtained without and with intravenous contrast. CONTRAST:  9mL GADAVIST GADOBUTROL 1 MMOL/ML IV SOLN COMPARISON:  Prior study from 12/09/2022. FINDINGS: Alignment: Vertebral bodies normally aligned with preservation of the normal thoracic kyphosis. Trace degenerative retrolisthesis of L1 on L2 noted. Vertebrae: Osseous metastasis involving the T3 vertebral body is seen. Associated pathologic compression fracture with vertebral plana formation and up to 6 mm of bony retropulsion. Additional osseous metastasis involves the entirety of the T4 vertebral body. Associated 40% height loss without significant bony retropulsion. Tumor extends to involve the posterior elements bilaterally. Epidural extension of tumor at these levels with tumor within the ventral epidural space, most pronounced at T3 where there is resultant severe spinal stenosis and cord compression (series 10, image 17). Secondary cord flattening with suspected early/mild cord signal changes (series 4, image 13). Thecal sac measures approximately 4-5 mm in AP diameter. No more than mild stenosis inferiorly at the level of T4. Moderate to severe bilateral foraminal narrowing noted at T2-3 and T3-4 as well. Multiple additional smaller osseous metastases seen scattered elsewhere throughout the thoracic spine. For reference purposes, the largest additional lesion involves the inferior aspect of T2 and measures 1.5 cm (series 5, image 12). No other significant extra osseous or epidural extension of tumor. No other pathologic compression fracture. Few additional scattered osseous metastases noted within the cervical spine on counter sequence. No visible extra osseous or epidural extension of tumor within the cervical spine itself. Cord: Cord impingement with suspected subtle early cord signal changes at the level of T3 as above. Otherwise normal signal and morphology.  Paraspinal and other soft tissues: Paraspinous soft tissues demonstrate no acute finding. Small layering left pleural effusion noted. Disc levels: Underlying mild for age multilevel thoracic spondylosis elsewhere throughout the thoracic spine. Scattered superimposed multilevel facet disease. No other significant spinal stenosis. Mild multilevel foraminal narrowing noted within the lower thoracic spine at T10-11 bilaterally and T12-L1 on the left. IMPRESSION: 1. Osseous metastases involving the T3 and T4 vertebral bodies with associated pathologic compression fractures as above. Epidural extension of tumor at these levels with resultant severe spinal stenosis and cord compression at the level of T3. Probable subtle early/mild cord signal changes at this level. 2. Multiple additional smaller osseous metastases scattered elsewhere throughout the thoracic spine. No other significant extra osseous or epidural extension of tumor. 3. Underlying mild for age thoracic spondylosis without other high-grade spinal stenosis. Mild multilevel foraminal narrowing within the lower thoracic spine as above. 4. Small layering left pleural effusion. Electronically Signed   By: Rise Mu M.D.   On: 03/30/2023 04:25    Assessment/Plan: 67 year old with known metastatic cancer to T3 and T4 with epidural extension and pathologic fracture.  I have recommended decompressive thoracic laminectomy at T3 inclusive of part of T4 I do not feel that it is inherently unstable secondary to the sternum and rib cage helping stabilize T3 and so I recommended decompression alone I extensively gone over the risks and benefits of the operation with him as well as perioperative course expectations of outcome and alternatives to surgery he understands and agrees to proceed forward.  LOS: 3 days     Nathan Berg 03/31/2023, 12:48 PM

## 2023-03-31 NOTE — Care Management Important Message (Signed)
Important Message  Patient Details  Name: Nathan Berg MRN: [id number] Date of Birth: June 27, 1956   Important Message Given:  Yes - Medicare IM     [name] 03/31/2023, 2:56 PM

## 2023-03-31 NOTE — Op Note (Signed)
Preoperative diagnosis: Metastatic cancer presumptive prostate cancer to T3.  Postoperative diagnosis: Same.  Procedure: Decompressive laminectomy T3 extending into the inferior aspect of the 2 3 disc base to below the 3 4 disc base at the level of T4 pedicle.  With partial facetectomy and evacuation of epidural tumor  Surgeon: Jillyn Hidden Bertie Mcconathy  Assistant: Barnett Abu  Anesthesia: General  EBL: Minimal.  HPI: 67 year old gentleman history of prostate cancer presented with difficulty walking numbness in his legs workup revealed metastatic disease to T3 and T4 with pathologic T3 fracture and epidural extension of tumor.  Due to the patient's progression of clinical syndrome imaging findings of a conservative treatment I recommended decompressive laminectomy at T3.  I extensively over the risks and benefits of the operation with the patient as well as perioperative course expectations of outcome and alternatives of surgery and he understood and agreed to proceed forward.  Operative procedure: Patient was brought into the OR was induced under general anesthesia positioned prone in pins the backside of his upper back was shaved prepped and draped in routine sterile fashion.  He had some anatomic landmarks that were able to use it centered over T4 so utilizing that I drilled a midline incision from the bottom of the T4 body up to the middle of the T2 body then incision was made after infiltration of 10 cc lidocaine with epi and subperiosteal dissection was carried lamina of T2-T3 and T4 interoperative x-ray confirmed indication of the T4 pedicle.  So then I remove the spinous process of T3 performed a complete central decompression by drilling out the gutters and marching at the sides and unroofing the lamina decompressing the central canal.  There was some epidural tumor on the left side of the spinal canal and this was all teased away with a black nerve hook and sent for specimen.  There was not a large  component of epidural tumor visualized and I elected not to drill into the facet or the pedicle as I was not can be able to resect the entire to the tumor and I felt that was can be too high risk to further destabilize the spine.  We had recommended decompression alone as a way to decompress the spinal cord but then allow him to get a subsequent follow-up radiation to help control the tumor burden to the T3 and T4 vertebral bodies.  I did explain there was a risk of fusion he did not have any evidence of overt instability although bone certainly did not have the greatest amount of integrity.  So after decompression was achieved meticulous hemostasis was maintained Gelfoam was opened up with a remedy Hemovac drain was placed the wound was closed in layers with active Vicryl and a running 4 subcuticular.  Dermabond benzoin Steri-Strips and sterile dressing was applied patient recovery in stable condition.  At the end the case all needle count sponge counts were correct.

## 2023-03-31 NOTE — Progress Notes (Signed)
Pt arrived to 4NP01 from PACU. Pt alert and orientedx4, VSS.  Robina Ade, RN

## 2023-03-31 NOTE — Anesthesia Postprocedure Evaluation (Signed)
Anesthesia Post Note  Patient: PALANI KERWICK  Procedure(s) Performed: THORACIC LAMINECTOMY FOR TUMOR (Spine Thoracic)     Patient location during evaluation: PACU Anesthesia Type: General Level of consciousness: awake and alert, oriented and patient cooperative Pain management: pain level controlled Vital Signs Assessment: post-procedure vital signs reviewed and stable Respiratory status: spontaneous breathing, nonlabored ventilation, respiratory function stable and patient connected to nasal cannula oxygen Cardiovascular status: blood pressure returned to baseline and stable Postop Assessment: no apparent nausea or vomiting Anesthetic complications: no   No notable events documented.  Last Vitals:  Vitals:   03/31/23 1715 03/31/23 1755  BP: 133/77 135/85  Pulse: 61 61  Resp: 15 18  Temp:  36.8 C  SpO2: 92% 91%    Last Pain:  Vitals:   03/31/23 1730  TempSrc:   PainSc: 6     LLE Motor Response: Purposeful movement (03/31/23 1800) LLE Sensation: Tingling (baseline) (03/31/23 1800) RLE Motor Response: Purposeful movement (03/31/23 1800) RLE Sensation: Tingling (baseline) (03/31/23 1800)      Izyan Ezzell,E. Payton Moder

## 2023-04-01 DIAGNOSIS — C61 Malignant neoplasm of prostate: Secondary | ICD-10-CM | POA: Diagnosis not present

## 2023-04-01 DIAGNOSIS — M4854XA Collapsed vertebra, not elsewhere classified, thoracic region, initial encounter for fracture: Secondary | ICD-10-CM | POA: Diagnosis not present

## 2023-04-01 MED ORDER — MUPIROCIN 2 % EX OINT
1.0000 | TOPICAL_OINTMENT | Freq: Two times a day (BID) | CUTANEOUS | Status: AC
  Administered 2023-04-01 – 2023-04-05 (×10): 1 via NASAL
  Filled 2023-04-01 (×2): qty 22

## 2023-04-01 MED ORDER — OXYCODONE HCL 5 MG PO TABS
5.0000 mg | ORAL_TABLET | ORAL | Status: DC | PRN
  Administered 2023-04-01 – 2023-04-08 (×20): 5 mg via ORAL
  Filled 2023-04-01 (×21): qty 1

## 2023-04-01 MED ORDER — PANTOPRAZOLE SODIUM 40 MG PO TBEC
40.0000 mg | DELAYED_RELEASE_TABLET | Freq: Every day | ORAL | Status: DC
  Administered 2023-04-01 – 2023-04-07 (×7): 40 mg via ORAL
  Filled 2023-04-01 (×7): qty 1

## 2023-04-01 MED ORDER — ACETAMINOPHEN 650 MG RE SUPP: 650.0000 mg | Freq: Four times a day (QID) | RECTAL | Status: DC | PRN

## 2023-04-01 MED ORDER — SENNOSIDES-DOCUSATE SODIUM 8.6-50 MG PO TABS
1.0000 | ORAL_TABLET | Freq: Two times a day (BID) | ORAL | Status: DC
  Administered 2023-04-01 – 2023-04-13 (×18): 1 via ORAL
  Filled 2023-04-01 (×20): qty 1

## 2023-04-01 MED ORDER — ACETAMINOPHEN 325 MG PO TABS: 650.0000 mg | ORAL_TABLET | ORAL | Status: DC | PRN

## 2023-04-01 MED ORDER — ACETAMINOPHEN 325 MG PO TABS
650.0000 mg | ORAL_TABLET | Freq: Four times a day (QID) | ORAL | Status: DC
  Administered 2023-04-01 – 2023-04-13 (×31): 650 mg via ORAL
  Filled 2023-04-01 (×36): qty 2

## 2023-04-01 MED ORDER — LACTULOSE 10 GM/15ML PO SOLN: 20.0000 g | Freq: Two times a day (BID) | ORAL | Status: DC | PRN

## 2023-04-01 MED ORDER — POLYETHYLENE GLYCOL 3350 17 G PO PACK
17.0000 g | PACK | Freq: Every day | ORAL | Status: DC
  Administered 2023-04-01 – 2023-04-03 (×3): 17 g via ORAL
  Filled 2023-04-01 (×3): qty 1

## 2023-04-01 MED ORDER — CHLORHEXIDINE GLUCONATE CLOTH 2 % EX PADS
6.0000 | MEDICATED_PAD | Freq: Every day | CUTANEOUS | Status: AC
  Administered 2023-04-01 – 2023-04-05 (×5): 6 via TOPICAL

## 2023-04-01 NOTE — Progress Notes (Signed)
Inpatient Rehab Admissions Coordinator:    I met with pt. To discuss potential CIR admit. Pt. States interest and that his wife can provide 24/7 support at d/c. I will follow and pursue for potential CIR admit.   Megan Salon, MS, CCC-SLP Rehab Admissions Coordinator  [phone] (celll) [phone] (office)

## 2023-04-01 NOTE — Progress Notes (Signed)
Progress Note    Nathan Berg   GUR:[id number]  DOB: 27-Jul-1955  DOA: 03/28/2023     4 PCP: Malachy Mood, MD  Initial CC: Back pain  Hospital Course: Mr. Nathan Berg is a 67 yo male with PMH metastatic prostate cancer to the bone, GAD who presented with worsening back pain. He had undergone outpatient workup with [facility] on 03/25/2023 with MRI T-spine which showed concern for new metastatic disease involving thoracic spine with concern for cord compression.  He was referred to the ER. MRI thoracic spine repeated on admission and evaluated by surgery.  He was also started on steroids on admission.  Interval History:  Tolerated surgery well yesterday.  Having incisional pain today and states better relief from Dilaudid.  Evaluated by PT/OT as well today.  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 Epidural extension of tumor at these levels with resultant severe spinal stenosis and cord compression at the level of T3. Probable subtle early/mild cord signal changes at this level. 2. Multiple additional smaller osseous metastases scattered elsewhere throughout the thoracic spine. No other significant extra osseous or epidural extension of tumor." -Continue Decadron - Continue Dilaudid for pain control - Neurosurgery following. Underwent "Decompressive laminectomy T3 extending into the inferior aspect of the 2 3 disc base to below the 3 4 disc base at the level of T4 pedicle. 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 Mosetta Putt; transitioning  back to [facility] [place] [facility]  GAD (generalized anxiety disorder) - Continue Xanax and Lexapro    Old records reviewed in assessment of this patient  Antimicrobials:   DVT prophylaxis:  SCD's Start: 03/31/23 1752 SCDs Start: 03/28/23 1958   Code Status:   Code Status: Limited: Do not attempt resuscitation (DNR) -DNR-LIMITED -Do Not Intubate/DNI   Mobility Assessment (Last 72 Hours)     Mobility Assessment     Row Name 04/01/23 1224 04/01/23 0729 03/31/23 1929 03/31/23 1800 03/31/23 0900   Does patient have an order for bedrest or is patient medically unstable -- No - Continue assessment No - Continue assessment No - Continue assessment No - Continue assessment   What is the highest level of mobility based on the progressive mobility assessment? Level 5 (Walks with assist in room/hall) - Balance while stepping forward/back and can walk in room with assist - Complete Level 5 (Walks with assist in room/hall) - Balance while stepping forward/back and can walk in room with assist - Complete Level 5 (Walks with assist in room/hall) - Balance while stepping forward/back and can walk in room with assist - Complete -- Level 5 (Walks with assist in room/hall) - Balance while stepping forward/back and can walk in room with assist - Complete    Row Name 03/31/23 0739 03/30/23 1935 03/30/23 1200 03/30/23 0800 03/29/23 2125   Does patient have an order for bedrest or is patient medically unstable No - Continue assessment No - Continue assessment No - Continue assessment No - Continue assessment No - Continue assessment   What is the highest level of mobility based on the progressive mobility assessment? Level 5 (Walks with assist  in room/hall) - Balance while stepping forward/back and can walk in room with assist - Complete Level 5 (Walks with assist in room/hall) - Balance while stepping forward/back and can walk in room with assist - Complete Level 5 (Walks with assist in room/hall) - Balance while stepping  forward/back and can walk in room with assist - Complete Level 5 (Walks with assist in room/hall) - Balance while stepping forward/back and can walk in room with assist - Complete Level 5 (Walks with assist in room/hall) - Balance while stepping forward/back and can walk in room with assist - Complete    Row Name 03/29/23 1647 03/29/23 1644         Does patient have an order for bedrest or is patient medically unstable No - Continue assessment No - Continue assessment      What is the highest level of mobility based on the progressive mobility assessment? Level 5 (Walks with assist in room/hall) - Balance while stepping forward/back and can walk in room with assist - Complete Level 5 (Walks with assist in room/hall) - Balance while stepping forward/back and can walk in room with assist - Complete               Barriers to discharge: none Disposition Plan:  Home Status is: Inpt  Objective: Blood pressure 113/77, pulse 75, temperature 98.6 F (37 C), temperature source Oral, resp. rate 18, height 5\' 10"  (1.778 m), weight 82.4 kg, SpO2 97%.  Examination:  Physical Exam Constitutional:      General: He is not in acute distress.    Appearance: Normal appearance.  HENT:     Head: Normocephalic and atraumatic.     Mouth/Throat:     Mouth: Mucous membranes are moist.  Eyes:     Extraocular Movements: Extraocular movements intact.  Cardiovascular:     Rate and Rhythm: Normal rate and regular rhythm.  Pulmonary:     Effort: Pulmonary effort is normal. No respiratory distress.     Breath sounds: Normal breath sounds. No wheezing.  Abdominal:     General: Bowel sounds are normal. There is no distension.     Palpations: Abdomen is soft.     Tenderness: There is no abdominal tenderness.  Musculoskeletal:        General: Normal range of motion.     Cervical back: Normal range of motion and neck supple.     Thoracic back: Tenderness (Surgical dressing in place with drain noted) present.   Skin:    General: Skin is warm and dry.  Neurological:     General: No focal deficit present.     Mental Status: He is alert.  Psychiatric:        Mood and Affect: Mood normal.        Behavior: Behavior normal.      Consultants:  Neurosurgery   Procedures:  03/31/2023: Decompressive laminectomy T3 extending into the inferior aspect of the 2 3 disc base to below the 3 4 disc base at the level of T4 pedicle. With partial facetectomy and evacuation of epidural tumor   Data Reviewed: No results found for this or any previous visit (from the past 24 hour(s)).   I have reviewed pertinent nursing notes, vitals, labs, and images as necessary. I have ordered labwork to follow up on as indicated.  I have reviewed the last notes from staff over past 24 hours. I have discussed patient's care plan and test results with nursing staff, CM/SW, and other staff as  appropriate.    LOS: 4 days   Lewie Chamber, MD [facility] 04/01/2023, 3:11 PM

## 2023-04-01 NOTE — TOC Initial Note (Addendum)
Transition of Care [facility]) - Initial/Assessment Note    Patient Details  Name: Nathan Berg MRN: [id number] Date of Birth: 02/28/1956  Transition [facility]) CM/SW Contact:    Lockie Pares, RN Phone Number: 04/01/2023, 9:35 AM  Clinical Narrative:                 Presented with metastasis involving T3. Surgical intervention lumbar lami, excision of epidural tumor. PT and OT ordered for today.  He lives at home with spouse in [place].  TOC will follow for needs, recommendations, and transitions of care. 1400 PT OT evaluations reveal recommendations for CIR  Expected Discharge Plan: Home w Home Health Services Barriers to Discharge: Continued Medical Work up   Patient Goals and CMS Choice            Expected Discharge Plan and Services   Discharge Planning Services: CM Consult   Living arrangements for the past 2 months: Single Family Home                                      Prior Living Arrangements/Services Living arrangements for the past 2 months: Single Family Home Lives with:: Spouse Patient language and need for interpreter reviewed:: Yes        Need for Family Participation in Patient Care: Yes (Comment) Care giver support system in place?: Yes (comment)   Criminal Activity/Legal Involvement Pertinent to Current Situation/Hospitalization: No - Comment as needed  Activities of Daily Living Home Assistive Devices/Equipment: Cane (specify quad or straight) ADL Screening (condition at time of admission) Does the patient have a NEW difficulty with bathing/dressing/toileting/self-feeding that is expected to last >3 days?: No Does the patient have a NEW difficulty with getting in/out of bed, walking, or climbing stairs that is expected to last >3 days?: No Does the patient have a NEW difficulty with communication that is expected to last >3 days?: No Is the patient deaf or have difficulty hearing?: No Does the patient have difficulty seeing, even  when wearing glasses/contacts?: No Does the patient have difficulty concentrating, remembering, or making decisions?: No  Permission Sought/Granted                  Emotional Assessment       Orientation: : Oriented to Self, Oriented to Situation, Oriented to Place Alcohol / Substance Use: Not Applicable Psych Involvement: No (comment)  Admission diagnosis:  Lower extremity weakness [R29.898] Metastasis of neoplasm to spinal canal [facility]) [C79.49] Patient Active Problem List   Diagnosis Date Noted   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 [place] [address] Phone: [phone] Fax: [phone]  [facility] - [place], [place] - [address] [place] [address] Phone: [phone] Fax: [phone]  [facility] - [place], [place] - [address]  [place] [address] Phone: [phone] Fax: [phone]  [id number] - [place], [place] - [address] [facility] [address] [place] [address] Phone: [phone] Fax: [phone]     Social Determinants of Health (SDOH) Social History: SDOH Screenings   Food Insecurity: No Food Insecurity (03/29/2023)  Housing: Low Risk  (03/29/2023)  Transportation Needs: No Transportation Needs (03/29/2023)  Utilities: Not At Risk (03/29/2023)  Social  Connections: Unknown (11/16/2021)   Received from [facility]  Tobacco Use: Low Risk  (03/28/2023)   SDOH Interventions:     Readmission Risk Interventions     No data to display

## 2023-04-01 NOTE — Evaluation (Signed)
Physical Therapy Evaluation Patient Details Name: Nathan Berg MRN: [id number] DOB: 20-Sep-1955 Today's Date: 04/01/2023  History of Present Illness  Pt is a 67 y/o male presenting to [facility] on 9/23 with low back pain, gait instability and falls; and transferred to [facility] due to concern of new metastatic disease to T3, T4 with associated spinal cord compression. S/P decompressive laminectomy T3 into T3 with partial facetectomy and evacuation of epidural tumor. PMH includes: arthritis, metastatic prostate cancer (with hx of chemo and radiation), hernia repair, prostatectomy.   Clinical Impression  Pt presents with condition above and deficits mentioned below, see PT Problem List. PTA, he was independent without DME, but he did begin to use a RW a few days ago when his legs became very weak. He resides with his significant other in a house with 3 STE and x2 steps to bedroom/bathroom and living room. Currently, pt displays deficits in R lower extremity strength and coordination, balance, and activity tolerance. He is at high risk for falls. Pt is currently requiring minA for transfers. He initially only needed minA to ambulate but needed modA as he fatigued and his R knee began to buckle more and he needed more assistance to guard his R knee in stance phase, to balance, and to manage the RW. Pt could greatly benefit from intensive inpatient rehab, > 3 hours/day. Will continue to follow acutely.      If plan is discharge home, recommend the following: A lot of help with walking and/or transfers;A lot of help with bathing/dressing/bathroom;Assistance with cooking/housework;Assist for transportation;Help with stairs or ramp for entrance   Can travel by private vehicle        Equipment Recommendations BSC/3in1;Wheelchair (measurements PT);Wheelchair cushion (measurements PT)  Recommendations for Other Services  Rehab consult    Functional Status Assessment Patient has had a recent decline in their  functional status and demonstrates the ability to make significant improvements in function in a reasonable and predictable amount of time.     Precautions / Restrictions Precautions Precautions: Fall;Back Precaution Booklet Issued: No Precaution Comments: no brace needed; reviewed precautions Required Braces or Orthoses:  (no brace needed orders) Restrictions Weight Bearing Restrictions: No      Mobility  Bed Mobility               General bed mobility comments: OOB in recliner    Transfers Overall transfer level: Needs assistance Equipment used: Rolling walker (2 wheels) Transfers: Sit to/from Stand Sit to Stand: Min assist, +2 safety/equipment           General transfer comment: cueing for hand placement and posture, min assist to fully power up and steady    Ambulation/Gait Ambulation/Gait assistance: Min assist, Mod assist, +2 safety/equipment Gait Distance (Feet): 40 Feet (x2 bouts of ~15 ft > ~40 ft) Assistive device: Rolling walker (2 wheels) Gait Pattern/deviations: Step-through pattern, Decreased step length - left, Decreased step length - right, Decreased stride length, Trunk flexed, Ataxic, Narrow base of support, Knees buckling Gait velocity: reduced Gait velocity interpretation: <1.31 ft/sec, indicative of household ambulator   General Gait Details: Pt ambulates with ataxic R>L leg movement/placement, needing cues to widen his stance, not cross his leg over the other when turning, and remain within the RW. Pt's R knee became increasingly unstable the further he ambulated, needing physical assistance at quads to stabilize during stance, progressing from minA to [facility] for balance, R leg management, and RW management as he fatigued. +2 for safety  Stairs  Wheelchair Mobility     Tilt Bed    Modified Rankin (Stroke Patients Only)       Balance Overall balance assessment: Needs assistance Sitting-balance support: No upper extremity  supported, Feet supported Sitting balance-Leahy Scale: Fair     Standing balance support: Bilateral upper extremity supported, During functional activity, No upper extremity supported Standing balance-Leahy Scale: Poor Standing balance comment: relies on external support                             Pertinent Vitals/Pain Pain Assessment Pain Assessment: Faces Faces Pain Scale: Hurts a little bit Pain Location: incisonal Pain Descriptors / Indicators: Discomfort Pain Intervention(s): Limited activity within patient's tolerance, Monitored during session, Repositioned    Home Living Family/patient expects to be discharged to:: Private residence Living Arrangements: Spouse/significant other Available Help at Discharge: Family;Available 24 hours/day Type of Home: House Home Access: Stairs to enter Entrance Stairs-Rails: Lawyer of Steps: 3   Home Layout:  (2 steps to bedroom/bathroom, and living room) Home Equipment: Agricultural consultant (2 wheels);Shower seat - built in;Cane - single point;Grab bars - tub/shower Additional Comments: may have access to a wheelchair    Prior Function Prior Level of Function : Independent/Modified Independent             Mobility Comments: independent but was using a walker for a few days once his legs started feeling weak, otherwise no AD ADLs Comments: up until saturday pt was independent and driving     Extremity/Trunk Assessment   Upper Extremity Assessment Upper Extremity Assessment: Defer to OT evaluation    Lower Extremity Assessment Lower Extremity Assessment: RLE deficits/detail RLE Deficits / Details: incoordination noted, ataxic movements; MMT scores of 3+ hip flexion, 4 knee extension, 4- ankle dorsiflexion; denied numbness/tingling currently, may have had some prior to surgery RLE Sensation: WNL RLE Coordination: decreased gross motor;decreased fine motor    Cervical / Trunk  Assessment Cervical / Trunk Assessment: Back Surgery  Communication   Communication Communication: No apparent difficulties  Cognition Arousal: Alert Behavior During Therapy: WFL for tasks assessed/performed Overall Cognitive Status: Within Functional Limits for tasks assessed                                          General Comments      Exercises     Assessment/Plan    PT Assessment Patient needs continued PT services  PT Problem List Decreased strength;Decreased activity tolerance;Decreased balance;Decreased mobility;Decreased coordination;Decreased knowledge of precautions;Decreased knowledge of use of DME       PT Treatment Interventions DME instruction;Gait training;Stair training;Functional mobility training;Therapeutic activities;Therapeutic exercise;Balance training;Neuromuscular re-education;Patient/family education    PT Goals (Current goals can be found in the Care Plan section)  Acute Rehab PT Goals Patient Stated Goal: to improve PT Goal Formulation: With patient Time For Goal Achievement: 04/15/23 Potential to Achieve Goals: Good    Frequency Min 5X/week     Co-evaluation PT/OT/SLP Co-Evaluation/Treatment: Yes Reason for Co-Treatment: For patient/therapist safety;To address functional/ADL transfers PT goals addressed during session: Mobility/safety with mobility;Balance;Proper use of DME         AM-PAC PT "6 Clicks" Mobility  Outcome Measure Help needed turning from your back to your side while in a flat bed without using bedrails?: A Little Help needed moving from lying on your back to sitting on the side  of a flat bed without using bedrails?: A Little Help needed moving to and from a bed to a chair (including a wheelchair)?: A Little Help needed standing up from a chair using your arms (e.g., wheelchair or bedside chair)?: A Little Help needed to walk in hospital room?: A Lot Help needed climbing 3-5 steps with a railing? :  Total 6 Click Score: 15    End of Session Equipment Utilized During Treatment: Gait belt Activity Tolerance: Patient tolerated treatment well Patient left: in chair;with call bell/phone within reach;with chair alarm set   PT Visit Diagnosis: Unsteadiness on feet (R26.81);Other abnormalities of gait and mobility (R26.89);Muscle weakness (generalized) (M62.81);Difficulty in walking, not elsewhere classified (R26.2);Other symptoms and signs involving the nervous system (R29.898)    Time: 8413-2440 PT Time Calculation (min) (ACUTE ONLY): 26 min   Charges:   PT Evaluation $PT Eval Moderate Complexity: 1 Mod   PT General Charges $$ ACUTE PT VISIT: 1 Visit         Virgil Benedict, PT, DPT Acute Rehabilitation Services  Office: [phone]   Bettina Gavia 04/01/2023, 12:33 PM

## 2023-04-01 NOTE — Plan of Care (Signed)
  Problem: Education: Goal: Knowledge of General Education information will improve Description: Including pain rating scale, medication(s)/side effects and non-pharmacologic comfort measures Outcome: Progressing   Problem: Health Behavior/Discharge Planning: Goal: Ability to manage health-related needs will improve Outcome: Progressing   Problem: Clinical Measurements: Goal: Ability to maintain clinical measurements within normal limits will improve Outcome: Progressing Goal: Will remain free from infection Outcome: Progressing Goal: Diagnostic test results will improve Outcome: Progressing Goal: Respiratory complications will improve Outcome: Progressing Goal: Cardiovascular complication will be avoided Outcome: Progressing   Problem: Coping: Goal: Level of anxiety will decrease Outcome: Progressing   Problem: Elimination: Goal: Will not experience complications related to bowel motility Outcome: Progressing Goal: Will not experience complications related to urinary retention Outcome: Progressing   Problem: Pain Managment: Goal: General experience of comfort will improve Outcome: Progressing   Problem: Safety: Goal: Ability to remain free from injury will improve Outcome: Progressing   Problem: Skin Integrity: Goal: Risk for impaired skin integrity will decrease Outcome: Progressing   Problem: Education: Goal: Ability to verbalize activity precautions or restrictions will improve Outcome: Progressing Goal: Knowledge of the prescribed therapeutic regimen will improve Outcome: Progressing Goal: Understanding of discharge needs will improve Outcome: Progressing   Problem: Activity: Goal: Ability to avoid complications of mobility impairment will improve Outcome: Progressing Goal: Ability to tolerate increased activity will improve Outcome: Progressing Goal: Will remain free from falls Outcome: Progressing   Problem: Bowel/Gastric: Goal: Gastrointestinal  status for postoperative course will improve Outcome: Progressing   Problem: Pain Management: Goal: Pain level will decrease Outcome: Progressing   Problem: Skin Integrity: Goal: Will show signs of wound healing Outcome: Progressing   Problem: Health Behavior/Discharge Planning: Goal: Identification of resources available to assist in meeting health care needs will improve Outcome: Progressing

## 2023-04-01 NOTE — Evaluation (Addendum)
Occupational Therapy Evaluation Patient Details Name: Nathan Berg MRN: [id number] DOB: 1956/05/22 Today's Date: 04/01/2023   History of Present Illness Pt is a 67 y/o male presenting to [facility] on 9/23 with low back pain, gait instability and falls; and transferred to [facility] due to concern of new metastatic disease to T3, T4 with associated spinal cord compression. S/P decompressive laminectomy T3 into T3 with partial facetectomy and evacuation of epidural tumor. PMH includes: arthritis, metastatic prostate cancer (with hx of chemo and radiation), hernia repair, prostatectomy.   Clinical Impression   PTA patient independent and driving. Admitted for above and presents with problem list below, including generalized weakness, impaired balance and decreased activity tolerance. Educated on safety, back precautions and ADL compensatory techniques. He requires min assist for transfers, but up to mod assist for mobility with fatigue; min assist for ADLs and relies on BUE support in standing.  Based on performance today, believe pt will best benefit from continued OT services acutely and after dc at an inpatient setting with >3hrs/day to optimize independence, safety and return to PLOF with ADLs and mobility.        If plan is discharge home, recommend the following: A lot of help with walking and/or transfers;A little help with bathing/dressing/bathroom;Assistance with cooking/housework;Assist for transportation;Help with stairs or ramp for entrance    Functional Status Assessment  Patient has had a recent decline in their functional status and demonstrates the ability to make significant improvements in function in a reasonable and predictable amount of time.  Equipment Recommendations  Other (comment) (tbd)    Recommendations for Other Services Rehab consult     Precautions / Restrictions Precautions Precautions: Fall;Back Precaution Booklet Issued: No Precaution Comments: no brace  needed Restrictions Weight Bearing Restrictions: No      Mobility Bed Mobility               General bed mobility comments: OOB in recliner    Transfers Overall transfer level: Needs assistance Equipment used: Rolling walker (2 wheels) Transfers: Sit to/from Stand Sit to Stand: Min assist, +2 safety/equipment           General transfer comment: cueing for hand placement and posture, min assist to fully power up and steady      Balance Overall balance assessment: Needs assistance Sitting-balance support: No upper extremity supported, Feet supported Sitting balance-Leahy Scale: Fair     Standing balance support: Bilateral upper extremity supported, During functional activity, No upper extremity supported Standing balance-Leahy Scale: Poor Standing balance comment: relies on external support                           ADL either performed or assessed with clinical judgement   ADL Overall ADL's : Needs assistance/impaired     Grooming: Minimal assistance;Standing Grooming Details (indicate cue type and reason): relies on external support         Upper Body Dressing : Set up;Sitting   Lower Body Dressing: Minimal assistance;Sit to/from stand   Toilet Transfer: Minimal assistance;+2 for safety/equipment;Ambulation;Rolling walker (2 wheels)   Toileting- Clothing Manipulation and Hygiene: Minimal assistance;Sit to/from stand       Functional mobility during ADLs: Minimal assistance;Moderate assistance;+2 for safety/equipment;Rolling walker (2 wheels)       Vision   Vision Assessment?: No apparent visual deficits     Perception         Praxis         Pertinent Vitals/Pain Pain Assessment  Pain Assessment: Faces Faces Pain Scale: Hurts a little bit Pain Location: incisonal Pain Descriptors / Indicators: Discomfort Pain Intervention(s): Limited activity within patient's tolerance, Monitored during session, Repositioned      Extremity/Trunk Assessment Upper Extremity Assessment Upper Extremity Assessment: Overall WFL for tasks assessed;Right hand dominant   Lower Extremity Assessment Lower Extremity Assessment: Defer to PT evaluation   Cervical / Trunk Assessment Cervical / Trunk Assessment: Back Surgery   Communication Communication Communication: No apparent difficulties   Cognition Arousal: Alert Behavior During Therapy: WFL for tasks assessed/performed Overall Cognitive Status: Within Functional Limits for tasks assessed                                       General Comments       Exercises     Shoulder Instructions      Home Living Family/patient expects to be discharged to:: Private residence Living Arrangements: Spouse/significant other Available Help at Discharge: Family;Available 24 hours/day Type of Home: House Home Access: Stairs to enter [employer] of Steps: 3 Entrance Stairs-Rails: Left;Right Home Layout:  (2 steps to bedroom/bathroom, and living room)     Bathroom Shower/Tub: Producer, television/film/video: Standard     Home Equipment: Agricultural consultant (2 wheels);Shower seat - built in;Cane - single point;Grab bars - tub/shower   Additional Comments: may have access to a wheelchair      Prior Functioning/Environment Prior Level of Function : Independent/Modified Independent             Mobility Comments: independent but was used walker for a few days once his legs started feeling weak ADLs Comments: up until saturday pt was independent and driving        OT Problem List: Decreased activity tolerance;Impaired balance (sitting and/or standing);Pain;Decreased knowledge of precautions;Decreased knowledge of use of DME or AE      OT Treatment/Interventions: Self-care/ADL training;Energy conservation;DME and/or AE instruction;Therapeutic activities;Patient/family education;Balance training    OT Goals(Current goals can be found  in the care plan section) Acute Rehab OT Goals Patient Stated Goal: get better OT Goal Formulation: With patient Time For Goal Achievement: 04/15/23 Potential to Achieve Goals: Good  OT Frequency: Min 1X/week    Co-evaluation              AM-PAC OT "6 Clicks" Daily Activity     Outcome Measure Help from another person eating meals?: A Little Help from another person taking care of personal grooming?: A Little Help from another person toileting, which includes using toliet, bedpan, or urinal?: A Little Help from another person bathing (including washing, rinsing, drying)?: A Little Help from another person to put on and taking off regular upper body clothing?: A Little Help from another person to put on and taking off regular lower body clothing?: A Little 6 Click Score: 18   End of Session Equipment Utilized During Treatment: Gait belt;Rolling walker (2 wheels) Nurse Communication: Mobility status  Activity Tolerance: Patient tolerated treatment well Patient left: in chair;with call bell/phone within reach;with chair alarm set  OT Visit Diagnosis: Other abnormalities of gait and mobility (R26.89);Pain Pain - part of body:  (back)                Time: 1610-9604 OT Time Calculation (min): 31 min Charges:  OT General Charges $OT Visit: 1 Visit OT Evaluation $OT Eval Moderate Complexity: 1 Mod  Nupur Hohman B, OT Acute Rehabilitation  Services Office [phone]   Chancy Milroy 04/01/2023, 10:51 AM

## 2023-04-02 DIAGNOSIS — M4854XD Collapsed vertebra, not elsewhere classified, thoracic region, subsequent encounter for fracture with routine healing: Secondary | ICD-10-CM | POA: Diagnosis not present

## 2023-04-02 DIAGNOSIS — C61 Malignant neoplasm of prostate: Secondary | ICD-10-CM | POA: Diagnosis not present

## 2023-04-02 NOTE — Progress Notes (Signed)
Progress Note    Nathan Berg   EAV:[id number]  DOB: January 31, 1956  DOA: 03/28/2023     5 PCP: Nathan Mood, MD  Initial CC: Back pain  Hospital Course: Nathan Berg is a 67 yo male with PMH metastatic prostate cancer to the bone, GAD who presented with worsening back pain. He had undergone outpatient workup with [facility] on 03/25/2023 with MRI T-spine which showed concern for new metastatic disease involving thoracic spine with concern for cord compression.  He was referred to the ER. MRI thoracic spine repeated on admission and evaluated by surgery.  He was also started on steroids on admission.  Interval History:  No events overnight.  Walking hall with therapy this morning.  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 Epidural extension of tumor at these levels with resultant severe spinal stenosis and cord compression at the level of T3. Probable subtle early/mild cord signal changes at this level. 2. Multiple additional smaller osseous metastases scattered elsewhere throughout the thoracic spine. No other significant extra osseous or epidural extension of tumor." -Continue Decadron - Continue Dilaudid for pain control - Neurosurgery following. Underwent "Decompressive laminectomy T3 extending into the inferior aspect of the 2 3 disc base to below the 3 4 disc base at the level of T4 pedicle. 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 Mosetta Berg; transitioning back to [facility]  GAD (generalized anxiety disorder) - Continue Xanax and  Lexapro    Old records reviewed in assessment of this patient  Antimicrobials:   DVT prophylaxis:  SCD's Start: 03/31/23 1752 SCDs Start: 03/28/23 1958   Code Status:   Code Status: Limited: Do not attempt resuscitation (DNR) -DNR-LIMITED -Do Not Intubate/DNI   Mobility Assessment (Last 72 Hours)     Mobility Assessment     Row Name 04/02/23 1300 04/02/23 0800 04/01/23 1924 04/01/23 1224 04/01/23 0729   Does patient have an order for bedrest or is patient medically unstable -- No - Continue assessment No - Continue assessment -- No - Continue assessment   What is the highest level of mobility based on the progressive mobility assessment? Level 5 (Walks with assist in room/hall) - Balance while stepping forward/back and can walk in room with assist - Complete Level 5 (Walks with assist in room/hall) - Balance while stepping forward/back and can walk in room with assist - Complete Level 5 (Walks with assist in room/hall) - Balance while stepping forward/back and can walk in room with assist - Complete Level 5 (Walks with assist in room/hall) - Balance while stepping forward/back and can walk in room with assist - Complete Level 5 (Walks with assist in room/hall) - Balance while stepping forward/back and can walk in room with assist - Complete    Row Name 03/31/23 1929 03/31/23 1800 03/31/23 0900 03/31/23 0739 03/30/23 1935   Does patient have an order for bedrest or is patient medically unstable No - Continue assessment No - Continue assessment No - Continue assessment No - Continue assessment No - Continue assessment   What is the highest level of mobility based on the progressive mobility assessment?  Level 5 (Walks with assist in room/hall) - Balance while stepping forward/back and can walk in room with assist - Complete -- Level 5 (Walks with assist in room/hall) - Balance while stepping forward/back and can walk in room with assist - Complete Level 5 (Walks with assist in room/hall) -  Balance while stepping forward/back and can walk in room with assist - Complete Level 5 (Walks with assist in room/hall) - Balance while stepping forward/back and can walk in room with assist - Complete            Barriers to discharge: none Disposition Plan:  Home Status is: Inpt  Objective: Blood pressure 105/77, pulse 79, temperature 97.7 F (36.5 C), temperature source Oral, resp. rate 19, height 5\' 10"  (1.778 m), weight 79.7 kg, SpO2 97%.  Examination:  Physical Exam Constitutional:      General: He is not in acute distress.    Appearance: Normal appearance.  HENT:     Head: Normocephalic and atraumatic.     Mouth/Throat:     Mouth: Mucous membranes are moist.  Eyes:     Extraocular Movements: Extraocular movements intact.  Cardiovascular:     Rate and Rhythm: Normal rate and regular rhythm.  Pulmonary:     Effort: Pulmonary effort is normal. No respiratory distress.     Breath sounds: Normal breath sounds. No wheezing.  Abdominal:     General: Bowel sounds are normal. There is no distension.     Palpations: Abdomen is soft.     Tenderness: There is no abdominal tenderness.  Musculoskeletal:        General: Normal range of motion.     Cervical back: Normal range of motion and neck supple.     Thoracic back: Tenderness (Surgical dressing in place with drain noted) present.  Skin:    General: Skin is warm and dry.  Neurological:     General: No focal deficit present.     Mental Status: He is alert.  Psychiatric:        Berg and Affect: Berg normal.        Behavior: Behavior normal.      Consultants:  Neurosurgery   Procedures:  03/31/2023: Decompressive laminectomy T3 extending into the inferior aspect of the 2 3 disc base to below the 3 4 disc base at the level of T4 pedicle. With partial facetectomy and evacuation of epidural tumor   Data Reviewed: No results found for this or any previous visit (from the past 24 hour(s)).   I have reviewed pertinent  nursing notes, vitals, labs, and images as necessary. I have ordered labwork to follow up on as indicated.  I have reviewed the last notes from staff over past 24 hours. I have discussed patient's care plan and test results with nursing staff, CM/SW, and other staff as appropriate.    LOS: 5 days   Lewie Chamber, MD [facility] 04/02/2023, 4:16 PM

## 2023-04-02 NOTE — Progress Notes (Signed)
Nathan Berg   DOB:17-Jan-1956   AC#:[id number]    ASSESSMENT & PLAN:  67 y.o.male with history mCRPC, bone metastases presented with difficulty walking, numbness in the leg after outside imaging showed metastatic disease involving thoracic spine with concern for cord compression.   9/26. Underwent Procedure: Decompressive laminectomy T3 extending into the inferior aspect of the 2 3 disc base to below the 3 4 disc base at the level of T4 pedicle. 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  We still have chemotherapy as he has good functional status for example docetaxel, cabazitaxel, radiation therapy with radium 223, and Pluvicto. His alkaline phosphatase is not significantly elevated Recommend outpatient PSMA PET scan upon discharge. Order placed Continue enzalutamide for now with ADT as outpatient   Upper back pain with band like sensation/known T3 metastasis S/p decompressive surgery Will follow up pathology as outpatient if discharged   Thank you for the consult,  Discharge planning Will follow as outpatient, planning repeat PSMA PET as outpatient.  I will request appointment with his discharge.  All questions were answered. The patient knows to call the clinic with any problems, questions or concerns.    Melven Sartorius, MD 04/02/2023 8:14 AM  Subjective:  Report of moving the wrong way pulling the sheath last night and resulting in more pain.  No weakness in the legs or paresthesia.  No other neurologic changes.   Objective:  Vitals:   04/02/23 0304 04/02/23 0559  BP: (!) 156/78 (!) 157/92  Pulse: 65   Resp:    Temp: 97.9 F (36.6 C)   SpO2: 96%      Intake/Output Summary (Last 24 hours) at 04/02/2023 0814 Last data filed at 04/02/2023 0606 Gross per 24 hour  Intake --  Output 1550 ml  Net -1550 ml    GENERAL: alert, no distress and  comfortable SKIN: Dressing in place over the upper back Musculoskeletal: no lower extremity edema NEURO: alert with fluent speech, no focal motor/sensory deficits Lower extremity strength 5 out of 5, sensation to light touch equal.   Labs:  Recent Labs    02/15/23 1134 03/09/23 0913 03/28/23 1800 03/30/23 0724  NA 138 141 136 136  K 4.4 4.1 3.8 3.7  CL 105 106 100 100  CO2 28 31 28 27   GLUCOSE 98 86 105* 91  BUN 12 16 15 17   CREATININE 0.87 0.87 0.83 0.79  CALCIUM 8.4* 9.2 8.9 8.8*  GFRNONAA >60 >60 >60 >60  PROT 6.3* 6.4* 7.2  --   ALBUMIN 3.7 3.8 4.0  --   AST 15 19 27   --   ALT 16 20 32  --   ALKPHOS 96 98 99  --   BILITOT 0.4 0.4 0.6  --     Studies:  DG Thoracic Spine 1 View  Result Date: 03/31/2023 CLINICAL DATA:  Thoracic laminectomy for tumor. EXAM: OPERATIVE THORACIC SPINE 1 VIEW(S) COMPARISON:  03/30/2023. FINDINGS: A single fluoroscopic image was obtained intraoperatively for thoracic laminectomy. Total fluoroscopy time is 2.9 seconds. Dose: 0.5 mGy. Please see operative report for additional information. IMPRESSION: Intraoperative utilization of fluoroscopy. Electronically Signed   By: Thornell Sartorius M.D.   On: 03/31/2023 20:01   DG C-Arm 1-60 Min-No Report  Result Date: 03/31/2023 Fluoroscopy was utilized by the requesting physician.  No radiographic interpretation.   MR OUTSIDE FILMS SPINE  Result Date:  03/30/2023 This examination belongs to an outside facility and is stored here for comparison purposes only.  Contact the originating outside institution for any associated report or interpretation.  CT THORACIC SPINE WO CONTRAST  Result Date: 03/30/2023 CLINICAL DATA:  Thoracic compression fracture.  Known malignancy. EXAM: CT THORACIC SPINE WITHOUT CONTRAST TECHNIQUE: Multidetector CT images of the thoracic were obtained using the standard protocol without intravenous contrast. RADIATION DOSE REDUCTION: This exam was performed according to the departmental  dose-optimization program which includes automated exposure control, adjustment of the mA and/or kV according to patient size and/or use of iterative reconstruction technique. COMPARISON:  Thoracic spine MRI 1 day prior FINDINGS: Alignment: Normal. Vertebrae: Again seen is marked compression deformity of the T3 vertebral body with near-complete loss of vertebral body height and 4 mm bony retropulsion consistent with pathologic fracture in the setting of osseous metastatic disease. There is also a nondisplaced fracture through the right T3 lamina and transverse process extending to the inferior articulating process (5-23, 7-37). Also again seen is the more mild pathologic compression fracture of the T4 vertebral body with approximately 40% loss of vertebral body height anteriorly and no bony retropulsion. There is a nondisplaced fracture of the right T4 transverse process (5-31, 7-32). The other vertebral body heights are preserved. The bones are diffusely demineralized. Known additional metastatic lesions as well as epidural tumor at T3 and T4 are better seen on the thoracic spine MRI from 1 day prior. Paraspinal and other soft tissues: Nodular scarring in the medial right lung apex is unchanged. A probable sebaceous cyst is noted in the subcutaneous fat just to the left of midline at the T1 level. A large hiatal hernia is unchanged. Disc levels: There is overall mild underlying degenerative change throughout the thoracic spine. Known severe spinal canal stenosis at T3 is better seen on the MRI. IMPRESSION: 1. Pathologic compression fractures of the T3 and T4 vertebral bodies are similar to the prior MRI, worse at T3 where there is essentially complete loss of vertebral body height and bony retropulsion. Known severe spinal canal stenosis due to the retropulsion and epidural tumor is better seen on the prior MRI. 2. Additional nondisplaced fractures of the right T3 lamina, transverse process, and inferior  articulating process, and right T4 transverse process. Electronically Signed   By: Lesia Hausen M.D.   On: 03/30/2023 13:50   MR THORACIC SPINE W WO CONTRAST  Result Date: 03/30/2023 CLINICAL DATA:  Initial evaluation for musculoskeletal neoplasm, history of prostate cancer. EXAM: MRI THORACIC WITHOUT AND WITH CONTRAST TECHNIQUE: Multiplanar and multiecho pulse sequences of the thoracic spine were obtained without and with intravenous contrast. CONTRAST:  9mL GADAVIST GADOBUTROL 1 MMOL/ML IV SOLN COMPARISON:  Prior study from 12/09/2022. FINDINGS: Alignment: Vertebral bodies normally aligned with preservation of the normal thoracic kyphosis. Trace degenerative retrolisthesis of L1 on L2 noted. Vertebrae: Osseous metastasis involving the T3 vertebral body is seen. Associated pathologic compression fracture with vertebral plana formation and up to 6 mm of bony retropulsion. Additional osseous metastasis involves the entirety of the T4 vertebral body. Associated 40% height loss without significant bony retropulsion. Tumor extends to involve the posterior elements bilaterally. Epidural extension of tumor at these levels with tumor within the ventral epidural space, most pronounced at T3 where there is resultant severe spinal stenosis and cord compression (series 10, image 17). Secondary cord flattening with suspected early/mild cord signal changes (series 4, image 13). Thecal sac measures approximately 4-5 mm in AP diameter. No more than mild  stenosis inferiorly at the level of T4. Moderate to severe bilateral foraminal narrowing noted at T2-3 and T3-4 as well. Multiple additional smaller osseous metastases seen scattered elsewhere throughout the thoracic spine. For reference purposes, the largest additional lesion involves the inferior aspect of T2 and measures 1.5 cm (series 5, image 12). No other significant extra osseous or epidural extension of tumor. No other pathologic compression fracture. Few additional  scattered osseous metastases noted within the cervical spine on counter sequence. No visible extra osseous or epidural extension of tumor within the cervical spine itself. Cord: Cord impingement with suspected subtle early cord signal changes at the level of T3 as above. Otherwise normal signal and morphology. Paraspinal and other soft tissues: Paraspinous soft tissues demonstrate no acute finding. Small layering left pleural effusion noted. Disc levels: Underlying mild for age multilevel thoracic spondylosis elsewhere throughout the thoracic spine. Scattered superimposed multilevel facet disease. No other significant spinal stenosis. Mild multilevel foraminal narrowing noted within the lower thoracic spine at T10-11 bilaterally and T12-L1 on the left. IMPRESSION: 1. Osseous metastases involving the T3 and T4 vertebral bodies with associated pathologic compression fractures as above. Epidural extension of tumor at these levels with resultant severe spinal stenosis and cord compression at the level of T3. Probable subtle early/mild cord signal changes at this level. 2. Multiple additional smaller osseous metastases scattered elsewhere throughout the thoracic spine. No other significant extra osseous or epidural extension of tumor. 3. Underlying mild for age thoracic spondylosis without other high-grade spinal stenosis. Mild multilevel foraminal narrowing within the lower thoracic spine as above. 4. Small layering left pleural effusion. Electronically Signed   By: Rise Mu M.D.   On: 03/30/2023 04:25   DG Chest 2 View  Result Date: 03/09/2023 CLINICAL DATA:  Chest pain.  History of prostate cancer. EXAM: CHEST - 2 VIEW COMPARISON:  October 22, 2022.  Nov 14, 2022. FINDINGS: The heart size and mediastinal contours are within normal limits. Both lungs are clear. Elevated right hemidiaphragm is noted. Severe compression deformity of upper thoracic vertebral body is noted consistent with metastatic disease as  noted on prior PET scan. Hiatal hernia is noted. IMPRESSION: No acute cardiopulmonary abnormality is seen. Hiatal hernia. Compression deformity of upper thoracic vertebral body as noted above. Electronically Signed   By: Lupita Raider M.D.   On: 03/09/2023 13:59

## 2023-04-02 NOTE — Progress Notes (Signed)
Physical Therapy Treatment Patient Details Name: Nathan Berg MRN: [id number] DOB: 1955-09-07 Today's Date: 04/02/2023   History of Present Illness Pt is a 67 y/o male presenting to [facility] on 9/23 with low back pain, gait instability and falls; and transferred to [facility] due to concern of new metastatic disease to T3, T4 with associated spinal cord compression. S/P decompressive laminectomy T3 into T3 with partial facetectomy and evacuation of epidural tumor. PMH includes: arthritis, metastatic prostate cancer (with hx of chemo and radiation), hernia repair, prostatectomy.    PT Comments  The pt was agreeable to session, able to make good progress with ambulation distance, but continues to demo impaired coordination, strength, and stability in BLE needing minA and chair follow for safe mobility. He had multiple episodes of knee buckling, scissoring steps, and narrow BOS needing cues and assist to correct. He was challenged by toe-tapping exercise for LE and will benefit from continued skilled PT to address LE strength, power, coordination, and endurance. Recommendations remain appropriate.     If plan is discharge home, recommend the following: A lot of help with walking and/or transfers;A lot of help with bathing/dressing/bathroom;Assistance with cooking/housework;Assist for transportation;Help with stairs or ramp for entrance   Can travel by private vehicle        Equipment Recommendations  BSC/3in1;Wheelchair (measurements PT);Wheelchair cushion (measurements PT)    Recommendations for Other Services       Precautions / Restrictions Precautions Precautions: Fall;Back Precaution Booklet Issued: No Precaution Comments: no brace needed; reviewed precautions Required Braces or Orthoses:  (no brace needed orders) Restrictions Weight Bearing Restrictions: No     Mobility  Bed Mobility               General bed mobility comments: OOB in recliner    Transfers Overall transfer level:  Needs assistance Equipment used: Rolling walker (2 wheels) Transfers: Sit to/from Stand Sit to Stand: Min assist           General transfer comment: cueing for hand placement and posture, min assist to fully power up and steady    Ambulation/Gait Ambulation/Gait assistance: Min assist, +2 safety/equipment (chair follow) Gait Distance (Feet): 15 Feet (+ 75 ft with x2 seated rest breaks) Assistive device: Rolling walker (2 wheels) Gait Pattern/deviations: Step-through pattern, Decreased step length - left, Decreased step length - right, Decreased stride length, Trunk flexed, Ataxic, Narrow base of support, Knees buckling, Scissoring Gait velocity: reduced Gait velocity interpretation: <1.31 ft/sec, indicative of household ambulator   General Gait Details: pt with ataxic steps (R>L) with intermittent narrow BOS and scissoring needing cues to widen BOS. Pt with RLE instability and buckling that progresses both at hip and knee with fatigue. assist at R knee at times for improved stability. pt reports mild increase in pain   Stairs             Wheelchair Mobility     Tilt Bed    Modified Rankin (Stroke Patients Only)       Balance Overall balance assessment: Needs assistance Sitting-balance support: No upper extremity supported, Feet supported Sitting balance-Leahy Scale: Fair     Standing balance support: Bilateral upper extremity supported, During functional activity, No upper extremity supported Standing balance-Leahy Scale: Poor Standing balance comment: relies on external support                            Cognition Arousal: Alert Behavior During Therapy: WFL for tasks assessed/performed Overall Cognitive Status:  Within Functional Limits for tasks assessed                                          Exercises Other Exercises Other Exercises: toe tap to bottle for LE coordination and hip flexion. increased challenge and dysmetria  with RLE, completed x20 with each LE    General Comments General comments (skin integrity, edema, etc.): VSS on RA      Pertinent Vitals/Pain Pain Assessment Pain Assessment: Faces Pain Score: 4  Faces Pain Scale: Hurts a little bit Pain Location: incisonal Pain Descriptors / Indicators: Discomfort Pain Intervention(s): Limited activity within patient's tolerance, Repositioned, RN gave pain meds during session     PT Goals (current goals can now be found in the care plan section) Acute Rehab PT Goals Patient Stated Goal: to improve PT Goal Formulation: With patient Time For Goal Achievement: 04/15/23 Potential to Achieve Goals: Good Progress towards PT goals: Progressing toward goals    Frequency    Min 5X/week       AM-PAC PT "6 Clicks" Mobility   Outcome Measure  Help needed turning from your back to your side while in a flat bed without using bedrails?: A Little Help needed moving from lying on your back to sitting on the side of a flat bed without using bedrails?: A Little Help needed moving to and from a bed to a chair (including a wheelchair)?: A Little Help needed standing up from a chair using your arms (e.g., wheelchair or bedside chair)?: A Little Help needed to walk in hospital room?: A Lot Help needed climbing 3-5 steps with a railing? : Total 6 Click Score: 15    End of Session Equipment Utilized During Treatment: Gait belt Activity Tolerance: Patient tolerated treatment well Patient left: in chair;with call bell/phone within reach;with chair alarm set Nurse Communication: Mobility status PT Visit Diagnosis: Unsteadiness on feet (R26.81);Other abnormalities of gait and mobility (R26.89);Muscle weakness (generalized) (M62.81);Difficulty in walking, not elsewhere classified (R26.2);Other symptoms and signs involving the nervous system (R29.898)     Time: 8119-1478 PT Time Calculation (min) (ACUTE ONLY): 27 min  Charges:    $Gait Training: 8-22  mins $Therapeutic Exercise: 8-22 mins PT General Charges $$ ACUTE PT VISIT: 1 Visit                     Vickki Muff, PT, DPT   Acute Rehabilitation Department Office [phone] Secure Chat Communication Preferred   Nathan Berg 04/02/2023, 1:08 PM

## 2023-04-02 NOTE — Progress Notes (Signed)
Subjective: Patient reports condition of back pain lower extremities are improved.  Objective: Vital signs in last 24 hours: Temp:  [97.6 F (36.4 C)-98.6 F (37 C)] 97.9 F (36.6 C) (09/28 0304) Pulse Rate:  [64-75] 65 (09/28 0304) Resp:  [15-18] 18 (09/27 1445) BP: (105-157)/(62-92) 157/92 (09/28 0559) SpO2:  [95 %-97 %] 96 % (09/28 0304) Weight:  [79.7 kg] 79.7 kg (09/28 0500)  Intake/Output from previous day: 09/27 0701 - 09/28 0700 In: 3 [I.V.:3] Out: 1550 [Urine:1475; Drains:75] Intake/Output this shift: No intake/output data recorded.  Strength 5 out of 5 incision clean dry and intact  Lab Results: No results for input(s): "WBC", "HGB", "HCT", "PLT" in the last 72 hours. BMET No results for input(s): "NA", "K", "CL", "CO2", "GLUCOSE", "BUN", "CREATININE", "CALCIUM" in the last 72 hours.  Studies/Results: DG Thoracic Spine 1 View  Result Date: 03/31/2023 CLINICAL DATA:  Thoracic laminectomy for tumor. EXAM: OPERATIVE THORACIC SPINE 1 VIEW(S) COMPARISON:  03/30/2023. FINDINGS: A single fluoroscopic image was obtained intraoperatively for thoracic laminectomy. Total fluoroscopy time is 2.9 seconds. Dose: 0.5 mGy. Please see operative report for additional information. IMPRESSION: Intraoperative utilization of fluoroscopy. Electronically Signed   By: Thornell Sartorius M.D.   On: 03/31/2023 20:01   DG C-Arm 1-60 Min-No Report  Result Date: 03/31/2023 Fluoroscopy was utilized by the requesting physician.  No radiographic interpretation.    Assessment/Plan: Postop day 2 decompressive thoracic laminectomy condition of back pain today seems to be more incisional seems different than his preoperative back pain.  Lower extremity still good we will continue to slowly mobilize with physical Occupational Therapy will need to plan radiation treatment to his spine if he is a candidate for SRS.  Await to hear from oncology it is possible we may have to go back and stabilize the spine after  radiation.  LOS: 5 days     Mariam Dollar 04/02/2023, 8:45 AM

## 2023-04-02 NOTE — Plan of Care (Signed)

## 2023-04-03 DIAGNOSIS — M4854XD Collapsed vertebra, not elsewhere classified, thoracic region, subsequent encounter for fracture with routine healing: Secondary | ICD-10-CM | POA: Diagnosis not present

## 2023-04-03 MED ORDER — FENTANYL 12 MCG/HR TD PT72
1.0000 | MEDICATED_PATCH | TRANSDERMAL | Status: DC
  Administered 2023-04-04 – 2023-04-09 (×3): 1 via TRANSDERMAL
  Filled 2023-04-03 (×4): qty 1

## 2023-04-03 MED ORDER — KETOROLAC TROMETHAMINE 30 MG/ML IJ SOLN
30.0000 mg | Freq: Four times a day (QID) | INTRAMUSCULAR | Status: AC
  Administered 2023-04-03 – 2023-04-06 (×10): 30 mg via INTRAVENOUS
  Filled 2023-04-03 (×10): qty 1

## 2023-04-03 NOTE — Progress Notes (Signed)
PT Cancellation Note  Patient Details Name: Nathan Berg MRN: [id number] DOB: [birth year]   Cancelled Treatment:    Reason Eval/Treat Not Completed: Other (comment)  Pt reports had been constipated and then last night started passing gas and Bms.  States has been up multiple times to restroom and with mobility is incontinent at this time.  Request to rest today.  PT agrees (Pt seen past 2 days and has been up frequently today).  He was in the chair.   Anise Salvo, PT Acute Rehab [facility] [phone]  [name] 04/03/2023, 2:23 PM

## 2023-04-03 NOTE — Progress Notes (Signed)
  NEUROSURGERY PROGRESS NOTE   Pt reports having severe back pain overnight. No new leg pain/N/T/W  EXAM:  BP 134/71 (BP Location: Left Arm)   Pulse 60   Temp (!) 97.5 F (36.4 C) (Oral)   Resp 18   Ht 5\' 10"  (1.778 m)   Wt 79.7 kg   SpO2 97%   BMI 25.21 kg/m   Awake, alert, oriented  Speech fluent, appropriate  CN grossly intact  5/5 BUE/BLE  Wound c/d/I JP 30cc  IMPRESSION:  67 y.o. male POD# 4 s/p thoracic lami for prostate met, neurologically stable  PLAN: - D/W primary team regarding pain mgmt, ok with toradol. Can also consider low-dose fentanyl patch. - D/c JP today - Cont PT/OT   Lisbeth Renshaw, MD [facility]

## 2023-04-03 NOTE — PMR Pre-admission (Signed)
PMR Admission Coordinator Pre-Admission Assessment  Patient: Nathan Berg is an 67 y.o., male MRN: [id number] DOB: 12/04/1955 Height: 5\' 10"  (177.8 cm) Weight: 79.7 kg  Insurance Information HMO:     PPO:      PCP:      IPA:      80/20:  no      OTHER:  PRIMARY: Medicare AB      Policy#: [id number]    Subscriber:  Phone#: Verified online    Fax#:  Pre-Cert#:       Employer:  Benefits:  Phone #:      Name:  Eff. Date: Parts A  and B effective 01/03/2019 Deduct: $1632      Out of Pocket Max:  None      Life Max: N/A  CIR: 100%      SNF: 100 days Outpatient: 80%     Co-Pay: 20% Home Health: 100%      Co-Pay: none DME: 80%     Co-Pay: 20% Providers: patient's choice  SECONDARY: [employer]      Policy#: [id number]      Phone#:   Financial Counselor:       Phone#:   The "Data Collection Information Summary" for patients in Inpatient Rehabilitation Facilities with attached "Privacy Act Statement-Health Care Records" was provided and verbally reviewed with: Patient  Emergency Contact Information Contact Information     Name Relation Home Work Mobile   [place] Spouse [phone]  [phone]      Other Contacts   None on File     Current Medical History  Patient Admitting Diagnosis: Mets to Spine s/p decompressive laminectomy History of Present Illness:   Nathan Berg is a 67 y.o. male with medical history significant for metastatic prostate cancer to bone, GAD, who was  admitted to [facility] on 03/28/2023 with concern regarding T3, T4 spinal cord compression after presenting from home to [facility] ED complaining of low back pain.  PMH includes: arthritis, metastatic prostate cancer (with hx of chemo and radiation), hernia repair, prostatectomy. MRI was performed on Friday, 03/25/2023, the read was not available until today, at which time the patient was contacted with results that included concern for new metastatic disease to T3, T4, with associated concern for spinal  cord compression at this level. Pt. Underwent S/P decompressive laminectomy T3 into T3 with partial facetectomy and evacuation of epidural tumor on 03/31/23. Per medical oncology, pt. To continue oral Xtandi  and Decadron. Pt. Seen by Radiation oncology and they recommend stereotactic radiation to begin 10/15. He was seen Post-op by PT/OT and they recommend CIR to assist return to PLOF.       Patient's medical record from [facility]  has been reviewed by the rehabilitation admission coordinator and physician.  Past Medical History  Past Medical History:  Diagnosis Date   Anxiety    Arthritis    Cancer (HCC)    Prostate cancer   Depression    GERD (gastroesophageal reflux disease)    H/O seasonal allergies    History of kidney stones    x1   Prostate cancer (HCC)     Has the patient had major surgery during 100 days prior to admission? Yes  Family History   family history includes Prostate cancer in his father.  Current Medications  Current Facility-Administered Medications:    0.9 %  sodium chloride infusion, 250 mL, Intravenous, Continuous, Donalee Citrin, MD   acetaminophen (TYLENOL) tablet 650 mg, 650 mg,  Oral, QID, 650 mg at 04/03/23 0904 **OR** [DISCONTINUED] acetaminophen (TYLENOL) suppository 650 mg, 650 mg, Rectal, Q4H PRN, Donalee Citrin, MD   ALPRAZolam Prudy Feeler) tablet 0.25 mg, 0.25 mg, Oral, BID PRN, Donalee Citrin, MD, 0.25 mg at 04/02/23 2234   alum & mag hydroxide-simeth (MAALOX/MYLANTA) 200-200-20 MG/5ML suspension 30 mL, 30 mL, Oral, Q6H PRN, Donalee Citrin, MD   Chlorhexidine Gluconate Cloth 2 % PADS 6 each, 6 each, Topical, Daily, Lewie Chamber, MD, 6 each at 04/03/23 0906   cyclobenzaprine (FLEXERIL) tablet 10 mg, 10 mg, Oral, TID PRN, Donalee Citrin, MD, 10 mg at 04/03/23 1610   dexamethasone (DECADRON) injection 10 mg, 10 mg, Intravenous, Q24H, Donalee Citrin, MD, 10 mg at 04/03/23 9604   enzalutamide (XTANDI) tablet 160 mg, 160 mg, Oral, Daily, Donalee Citrin, MD, 160  mg at 04/03/23 1024   escitalopram (LEXAPRO) tablet 20 mg, 20 mg, Oral, Daily, Donalee Citrin, MD, 20 mg at 04/03/23 0904   gabapentin (NEURONTIN) capsule 100 mg, 100 mg, Oral, TID, Donalee Citrin, MD, 100 mg at 04/03/23 0904   HYDROmorphone (DILAUDID) injection 1 mg, 1 mg, Intravenous, Q3H PRN, Donalee Citrin, MD, 1 mg at 04/03/23 0901   lactulose (CHRONULAC) 10 GM/15ML solution 20 g, 20 g, Oral, BID PRN, Lewie Chamber, MD   lidocaine (LIDODERM) 5 % 1 patch, 1 patch, Transdermal, Q24H, Donalee Citrin, MD, 1 patch at 04/02/23 2239   melatonin tablet 3 mg, 3 mg, Oral, QHS PRN, Donalee Citrin, MD, 3 mg at 04/02/23 2235   menthol-cetylpyridinium (CEPACOL) lozenge 3 mg, 1 lozenge, Oral, PRN **OR** phenol (CHLORASEPTIC) mouth spray 1 spray, 1 spray, Mouth/Throat, PRN, Donalee Citrin, MD   mupirocin ointment (BACTROBAN) 2 % 1 Application, 1 Application, Nasal, BID, Lewie Chamber, MD, 1 Application at 04/03/23 0904   naloxone [facility]) injection 0.4 mg, 0.4 mg, Intravenous, PRN, Donalee Citrin, MD   ondansetron [facility]) injection 4 mg, 4 mg, Intravenous, Q6H PRN, Donalee Citrin, MD   ondansetron [facility]) tablet 4 mg, 4 mg, Oral, Q6H PRN **OR** ondansetron (ZOFRAN) injection 4 mg, 4 mg, Intravenous, Q6H PRN, Donalee Citrin, MD   oxyCODONE (Oxy IR/ROXICODONE) immediate release tablet 5 mg, 5 mg, Oral, Q4H PRN, Lewie Chamber, MD, 5 mg at 04/03/23 1019   pantoprazole (PROTONIX) EC tablet 40 mg, 40 mg, Oral, QHS, Lewie Chamber, MD, 40 mg at 04/02/23 2236   polyethylene glycol (MIRALAX / GLYCOLAX) packet 17 g, 17 g, Oral, Daily, Lewie Chamber, MD, 17 g at 04/03/23 0902   senna-docusate (Senokot-S) tablet 1 tablet, 1 tablet, Oral, BID, Lewie Chamber, MD, 1 tablet at 04/03/23 0904   sodium chloride flush (NS) 0.9 % injection 3 mL, 3 mL, Intravenous, Q12H, Donalee Citrin, MD, 3 mL at 04/03/23 0905   sodium chloride flush (NS) 0.9 % injection 3 mL, 3 mL, Intravenous, PRN, Donalee Citrin, MD  Patients Current Diet:  Diet Order             Diet  regular Room service appropriate? Yes; Fluid consistency: Thin  Diet effective now                   Precautions / Restrictions Precautions Precautions: Fall, Back Precaution Booklet Issued: No Precaution Comments: no brace needed; reviewed precautions Restrictions Weight Bearing Restrictions: No   Has the patient had 2 or more falls or a fall with injury in the past year? Yes  Prior Activity Level Community (5-7x/wk): Pt. active in the community PTA  Prior Functional Level Self Care: Did the patient need help  bathing, dressing, using the toilet or eating? Independent  Indoor Mobility: Did the patient need assistance with walking from room to room (with or without device)? Independent  Stairs: Did the patient need assistance with internal or external stairs (with or without device)? Independent  Functional Cognition: Did the patient need help planning regular tasks such as shopping or remembering to take medications? Independent  Patient Information Are you of Hispanic, Latino/a,or Spanish origin?: A. No, not of Hispanic, Latino/a, or Spanish origin What is your race?: A. White Do you need or want an interpreter to communicate with a doctor or health care staff?: 0. No  Patient's Response To:  Health Literacy and Transportation Is the patient able to respond to health literacy and transportation needs?: Yes Health Literacy - How often do you need to have someone help you when you read instructions, pamphlets, or other written material from your doctor or pharmacy?: Never In the past 12 months, has lack of transportation kept you from medical appointments or from getting medications?: Yes In the past 12 months, has lack of transportation kept you from meetings, work, or from getting things needed for daily living?: Yes  Home Assistive Devices / Equipment Home Assistive Devices/Equipment: Medical laboratory scientific officer (specify quad or straight) Home Equipment: Agricultural consultant (2 wheels), Shower seat  - built in, [place] - single point, Grab bars - tub/shower  Prior Device Use: Indicate devices/aids used by the patient prior to current illness, exacerbation or injury? None of the above  Current Functional Level Cognition  Overall Cognitive Status: Within Functional Limits for tasks assessed Orientation Level: Oriented X4    Extremity Assessment (includes Sensation/Coordination)  Upper Extremity Assessment: Defer to OT evaluation  Lower Extremity Assessment: RLE deficits/detail RLE Deficits / Details: incoordination noted, ataxic movements; MMT scores of 3+ hip flexion, 4 knee extension, 4- ankle dorsiflexion; denied numbness/tingling currently, may have had some prior to surgery RLE Sensation: WNL RLE Coordination: decreased gross motor, decreased fine motor    ADLs  Overall ADL's : Needs assistance/impaired Grooming: Minimal assistance, Standing Grooming Details (indicate cue type and reason): relies on external support Upper Body Dressing : Set up, Sitting Lower Body Dressing: Minimal assistance, Sit to/from stand Toilet Transfer: Minimal assistance, +2 for safety/equipment, Ambulation, Rolling walker (2 wheels) Toileting- Clothing Manipulation and Hygiene: Minimal assistance, Sit to/from stand Functional mobility during ADLs: Minimal assistance, Moderate assistance, +2 for safety/equipment, Rolling walker (2 wheels)    Mobility  General bed mobility comments: OOB in recliner    Transfers  Overall transfer level: Needs assistance Equipment used: Rolling walker (2 wheels) Transfers: Sit to/from Stand Sit to Stand: Min assist General transfer comment: cueing for hand placement and posture, min assist to fully power up and steady    Ambulation / Gait / Stairs / Wheelchair Mobility  Ambulation/Gait Ambulation/Gait assistance: Min assist, +2 safety/equipment (chair follow) Gait Distance (Feet): 15 Feet (+ 75 ft with x2 seated rest breaks) Assistive device: Rolling walker (2  wheels) Gait Pattern/deviations: Step-through pattern, Decreased step length - left, Decreased step length - right, Decreased stride length, Trunk flexed, Ataxic, Narrow base of support, Knees buckling, Scissoring General Gait Details: pt with ataxic steps (R>L) with intermittent narrow BOS and scissoring needing cues to widen BOS. Pt with RLE instability and buckling that progresses both at hip and knee with fatigue. assist at R knee at times for improved stability. pt reports mild increase in pain Gait velocity: reduced Gait velocity interpretation: <1.31 ft/sec, indicative of household ambulator    Posture /  Balance Balance Overall balance assessment: Needs assistance Sitting-balance support: No upper extremity supported, Feet supported Sitting balance-Leahy Scale: Fair Standing balance support: Bilateral upper extremity supported, During functional activity, No upper extremity supported Standing balance-Leahy Scale: Poor Standing balance comment: relies on external support    Special needs/care consideration Skin surgical incision and Special service needs Radiation treatments at [facility]  10/15, 10/17, 10/21, 10/23 and 10/25   Previous Home Environment (from acute therapy documentation) Living Arrangements: Spouse/significant other  Lives With: Spouse Available Help at Discharge: Family, Available 24 hours/day Type of Home: House Home Layout: One level Home Access: Stairs to enter Entrance Stairs-Rails: Left, Right Entrance Stairs-Number of Steps: 3 Bathroom Shower/Tub: Health visitor: Standard Bathroom Accessibility: Yes How Accessible: Accessible via walker Home Care Services: No Additional Comments: may have access to a wheelchair  Discharge Living Setting Plans for Discharge Living Setting: Patient's home Type of Home at Discharge: House Discharge Home Layout: One level Discharge Home Access: Level entry Discharge Bathroom Shower/Tub: Tub/shower unit Discharge  Bathroom Toilet: Standard Discharge Bathroom Accessibility: Yes How Accessible: Accessible via walker, Accessible via wheelchair Does the patient have any problems obtaining your medications?: No  Social/Family/Support Systems Patient Roles: Spouse Contact Information: [phone] Anticipated Caregiver: Montoya Ovalle Anticipated Caregiver's Contact Information: 24/7 Ability/Limitations of Caregiver: Wife can provide 24/7 supervision to min A Caregiver Availability: 24/7 Discharge Plan Discussed with Primary Caregiver: Yes Is Caregiver In Agreement with Plan?: Yes Does Caregiver/Family have Issues with Lodging/Transportation while Pt is in Rehab?: No  Goals Patient/Family Goal for Rehab: PT/OT Min A to Supervisoin Expected length of stay: 12-14 days Pt/Family Agrees to Admission and willing to participate: Yes Program Orientation Provided & Reviewed with Pt/Caregiver Including Roles  & Responsibilities: Yes  Decrease burden of Care through IP rehab admission: not anticipated    Possible need for SNF placement upon discharge: not anticipated   Patient Condition: I have reviewed medical records from [facility], spoken with CM, and patient and spouse. I met with patient at the bedside for inpatient rehabilitation assessment.  Patient will benefit from ongoing PT and OT, can actively participate in 3 hours of therapy a day 5 days of the week, and can make measurable gains during the admission.  Patient will also benefit from the coordinated team approach during an Inpatient Acute Rehabilitation admission.  The patient will receive intensive therapy as well as Rehabilitation physician, nursing, social worker, and care management interventions.  Due to safety, skin/wound care, disease management, medication administration, pain management, and patient education the patient requires 24 hour a day rehabilitation nursing.  The patient is currently min A to CGA with mobility and  basic ADLs.  Discharge setting and therapy post discharge at home with home health is anticipated.  Patient has agreed to participate in the Acute Inpatient Rehabilitation Program and will admit today.  Preadmission Screen Completed By:  Jeronimo Greaves, 04/03/2023 10:50 AM ______________________________________________________________________   Discussed status with Dr. Natale Lay on 04/13/23  at 930 and received approval for admission today.  Admission Coordinator:  Jeronimo Greaves, CCC-SLP, time 1155/Date 04/13/23   Assessment/Plan: Diagnosis: Metastatic prostate cancer metastasis to T spine with compression fracture and spinal stenosis s/p laminectomy Does the need for close, 24 hr/day Medical supervision in concert with the patient's rehab needs make it unreasonable for this patient to be served in a less intensive setting? Yes Co-Morbidities requiring supervision/potential complications: hypokalemia, Hyponatremia, anziety  Due to bladder management, bowel management, safety, skin/wound care, disease management,  medication administration, pain management, and patient education, does the patient require 24 hr/day rehab nursing? Yes Does the patient require coordinated care of a physician, rehab nurse, PT, OT, and SLP to address physical and functional deficits in the context of the above medical diagnosis(es)? Yes Addressing deficits in the following areas: balance, endurance, locomotion, strength, transferring, bowel/bladder control, bathing, dressing, feeding, grooming, toileting, and psychosocial support Can the patient actively participate in an intensive therapy program of at least 3 hrs of therapy 5 days a week? Yes The potential for patient to make measurable gains while on inpatient rehab is excellent Anticipated functional outcomes upon discharge from inpatient rehab: supervision and min assist PT, supervision and min assist OT, n/a SLP Estimated rehab length of stay to reach the above  functional goals is: 12-14 Anticipated discharge destination: Home 10. Overall Rehab/Functional Prognosis: good   MD Signature: Fanny Dance

## 2023-04-03 NOTE — Progress Notes (Signed)
Progress Note    Nathan Berg   GEX:[id number]  DOB: [birth year]  DOA: 03/28/2023     6 PCP: Malachy Mood, MD  Initial CC: Back pain  Hospital Course: Mr. Nathan Berg is a 67 yo male with PMH metastatic prostate cancer to the bone, GAD who presented with worsening back pain. He had undergone outpatient workup with [facility] on 03/25/2023 with MRI T-spine which showed concern for new metastatic disease involving thoracic spine with concern for cord compression.  He was referred to the ER. MRI thoracic spine repeated on admission and evaluated by surgery.  He was also started on steroids on admission.  Interval History:  Still having uncontrolled pain and that is his biggest complaint and concern.  Pain regimen further modified today.  Adding fentanyl patch and Toradol after discussion with neurosurgery. JP drain removed today by neurosurgery as well. Ongoing evaluation for CIR.  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 Epidural extension of tumor at these levels with resultant severe spinal stenosis and cord compression at the level of T3. Probable subtle early/mild cord signal changes at this level. 2. Multiple additional smaller osseous metastases scattered elsewhere throughout the thoracic spine. No other significant extra osseous or epidural extension of tumor." -Continue Decadron - adjusting pain regimen; will add fent patch and toradol to see if helps further - JP drain removed by neurosurgery 9/29 - Neurosurgery following. Underwent "Decompressive laminectomy T3 extending into the inferior aspect of the 2 3 disc base to below the 3 4 disc base at the level of T4 pedicle. With partial facetectomy and evacuation of epidural tumor " on 03/31/23 - continue with PT/OT; possible CIR placement   Malignant neoplasm  of prostate (HCC) - stage IV prostate CA with bone mets; s/p robotic assisted laparoscopic radical prostatectomy and bilateral lymph node dissection in March 2015, pT2N0  - follows with Dr. Mosetta Putt; transitioning back to [facility]  GAD (generalized anxiety disorder) - Continue Xanax and Lexapro    Old records reviewed in assessment of this patient  Antimicrobials:   DVT prophylaxis:  SCD's Start: 03/31/23 1752 SCDs Start: 03/28/23 1958   Code Status:   Code Status: Limited: Do not attempt resuscitation (DNR) -DNR-LIMITED -Do Not Intubate/DNI   Mobility Assessment (Last 72 Hours)     Mobility Assessment     Row Name 04/03/23 0800 04/03/23 0000 04/02/23 2000 04/02/23 1530 04/02/23 1300   Does patient have an order for bedrest or is patient medically unstable No - Continue assessment No - Continue assessment No - Continue assessment No - Continue assessment --   What is the highest level of mobility based on the progressive mobility assessment? Level 4 (Walks with assist in room) - Balance while marching in place and cannot step forward and back - Complete Level 4 (Walks with assist in room) - Balance while marching in place and cannot step forward and back - Complete Level 4 (Walks with assist in room) - Balance while marching in place and cannot step forward and back - Complete Level 4 (Walks with assist in room) - Balance while marching in place and cannot step forward and back - Complete Level 5 (Walks with assist in room/hall) - Balance while stepping forward/back and can walk in room with assist - Complete    Row Name 04/02/23 0800 04/01/23 1924 04/01/23 1224 04/01/23 0729 03/31/23 1929  Does patient have an order for bedrest or is patient medically unstable No - Continue assessment No - Continue assessment -- No - Continue assessment No - Continue assessment   What is the highest level of mobility based on the progressive mobility assessment? Level 5 (Walks with assist in room/hall) -  Balance while stepping forward/back and can walk in room with assist - Complete Level 5 (Walks with assist in room/hall) - Balance while stepping forward/back and can walk in room with assist - Complete Level 5 (Walks with assist in room/hall) - Balance while stepping forward/back and can walk in room with assist - Complete Level 5 (Walks with assist in room/hall) - Balance while stepping forward/back and can walk in room with assist - Complete Level 5 (Walks with assist in room/hall) - Balance while stepping forward/back and can walk in room with assist - Complete    Row Name 03/31/23 1800           Does patient have an order for bedrest or is patient medically unstable No - Continue assessment                Barriers to discharge: none Disposition Plan:  Home Status is: Inpt  Objective: Blood pressure 131/82, pulse 67, temperature 98.2 F (36.8 C), temperature source Oral, resp. rate 18, height 5\' 10"  (1.778 m), weight 79.7 kg, SpO2 96%.  Examination:  Physical Exam Constitutional:      General: He is not in acute distress.    Appearance: Normal appearance.  HENT:     Head: Normocephalic and atraumatic.     Mouth/Throat:     Mouth: Mucous membranes are moist.  Eyes:     Extraocular Movements: Extraocular movements intact.  Cardiovascular:     Rate and Rhythm: Normal rate and regular rhythm.  Pulmonary:     Effort: Pulmonary effort is normal. No respiratory distress.     Breath sounds: Normal breath sounds. No wheezing.  Abdominal:     General: Bowel sounds are normal. There is no distension.     Palpations: Abdomen is soft.     Tenderness: There is no abdominal tenderness.  Musculoskeletal:        General: Normal range of motion.     Cervical back: Normal range of motion and neck supple.     Thoracic back: Tenderness (Surgical dressing in place with drain noted) present.  Skin:    General: Skin is warm and dry.  Neurological:     General: No focal deficit present.      Mental Status: He is alert.  Psychiatric:        Mood and Affect: Mood normal.        Behavior: Behavior normal.      Consultants:  Neurosurgery   Procedures:  03/31/2023: Decompressive laminectomy T3 extending into the inferior aspect of the 2 3 disc base to below the 3 4 disc base at the level of T4 pedicle. With partial facetectomy and evacuation of epidural tumor   Data Reviewed: No results found for this or any previous visit (from the past 24 hour(s)).   I have reviewed pertinent nursing notes, vitals, labs, and images as necessary. I have ordered labwork to follow up on as indicated.  I have reviewed the last notes from staff over past 24 hours. I have discussed patient's care plan and test results with nursing staff, CM/SW, and other staff as appropriate.    LOS: 6 days   Lewie Chamber, MD [facility]  04/03/2023, 12:38 PM

## 2023-04-04 DIAGNOSIS — M4854XD Collapsed vertebra, not elsewhere classified, thoracic region, subsequent encounter for fracture with routine healing: Secondary | ICD-10-CM | POA: Diagnosis not present

## 2023-04-04 MED ORDER — POLYETHYLENE GLYCOL 3350 17 G PO PACK: 17.0000 g | PACK | Freq: Every day | ORAL | Status: DC | PRN

## 2023-04-04 NOTE — Progress Notes (Signed)
Subjective: Patient reports better with pain control denies any new lower extremity symptoms  Objective: Vital signs in last 24 hours: Temp:  [97.8 F (36.6 C)-98.6 F (37 C)] 98.6 F (37 C) (09/30 1152) Pulse Rate:  [66-86] 79 (09/30 1152) Resp:  [16-18] 18 (09/30 1152) BP: (103-139)/(61-84) 116/77 (09/30 1152) SpO2:  [95 %-100 %] 99 % (09/30 1152)  Intake/Output from previous day: 09/29 0701 - 09/30 0700 In: 603 [P.O.:600; I.V.:3] Out: 550 [Urine:550] Intake/Output this shift: Total I/O In: 240 [P.O.:240] Out: -   Strength 5 out of 5 incision clean dry and intact  Lab Results: No results for input(s): "WBC", "HGB", "HCT", "PLT" in the last 72 hours. BMET No results for input(s): "NA", "K", "CL", "CO2", "GLUCOSE", "BUN", "CREATININE", "CALCIUM" in the last 72 hours.  Studies/Results: No results found.  Assessment/Plan: Status post decompressive laminectomy we will contact ration oncology to discuss patient's eligibility for SRS.  LOS: 7 days     Mariam Dollar 04/04/2023, 1:21 PM

## 2023-04-04 NOTE — Progress Notes (Signed)
Progress Note    Nathan Berg   ZHY:[id number]  DOB: Apr 02, 1956  DOA: 03/28/2023     7 PCP: Malachy Mood, MD  Initial CC: Back pain  Hospital Course: Nathan Berg is a 67 yo male with PMH metastatic prostate cancer to the bone, GAD who presented with worsening back pain. He had undergone outpatient workup with [facility] on 03/25/2023 with MRI T-spine which showed concern for new metastatic disease involving thoracic spine with concern for cord compression.  He was referred to the ER. MRI thoracic spine repeated on admission and evaluated by surgery.  He was also started on steroids on admission.  Interval History:  Pain better controlled after regimen adjustments yesterday. He was constipated and had multiple laxatives yesterday, now having loose stools today.  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 Epidural extension of tumor at these levels with resultant severe spinal stenosis and cord compression at the level of T3. Probable subtle early/mild cord signal changes at this level. 2. Multiple additional smaller osseous metastases scattered elsewhere throughout the thoracic spine. No other significant extra osseous or epidural extension of tumor." --- Neurosurgery following. Underwent "Decompressive laminectomy T3 extending into the inferior aspect of the 2 3 disc base to below the 3 4 disc base at the level of T4 pedicle. 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 Mosetta Putt; transitioning back to [facility]  GAD (generalized anxiety disorder) - Continue Xanax and Lexapro    Old records reviewed in assessment of this patient  Antimicrobials:   DVT prophylaxis:  SCD's Start: 03/31/23 1752 SCDs Start: 03/28/23 1958   Code Status:   Code Status: Limited: Do not attempt resuscitation (DNR) -DNR-LIMITED -Do Not Intubate/DNI   Mobility Assessment (Last 72 Hours)     Mobility Assessment     Row Name 04/04/23 1200 04/04/23 1156 04/04/23 0800 04/04/23 0000 04/03/23 2000   Does patient have an order for bedrest or is patient medically unstable No - Continue assessment -- No - Continue assessment No - Continue assessment No - Continue assessment   What is the highest level of mobility based on the progressive mobility assessment? Level 5 (Walks with assist in room/hall) - Balance while stepping forward/back and can walk in room with assist - Complete Level 5 (Walks with assist in room/hall) - Balance while stepping forward/back and can walk in room with assist - Complete Level 5 (Walks with assist in room/hall) - Balance while stepping forward/back and can walk in room with assist - Complete Level 4 (Walks with assist in room) - Balance while marching in place and cannot step forward and back - Complete Level 4 (Walks with assist in room) - Balance while marching in place and cannot step forward and back - Complete    Row Name 04/03/23 1226 04/03/23 0800 04/03/23 0000 04/02/23 2000 04/02/23 1530   Does patient have an order for bedrest or is patient medically unstable No - Continue assessment No - Continue assessment  No - Continue assessment No - Continue assessment No - Continue assessment   What is the highest level of mobility based on the progressive mobility assessment? Level 5 (Walks with assist in room/hall) - Balance while stepping forward/back and can walk in room with assist -  Complete Level 4 (Walks with assist in room) - Balance while marching in place and cannot step forward and back - Complete Level 4 (Walks with assist in room) - Balance while marching in place and cannot step forward and back - Complete Level 4 (Walks with assist in room) - Balance while marching in place and cannot step forward and back - Complete Level 4 (Walks with assist in room) - Balance while marching in place and cannot step forward and back - Complete    Row Name 04/02/23 1300 04/02/23 0800 04/01/23 1924       Does patient have an order for bedrest or is patient medically unstable -- No - Continue assessment No - Continue assessment     What is the highest level of mobility based on the progressive mobility assessment? Level 5 (Walks with assist in room/hall) - Balance while stepping forward/back and can walk in room with assist - Complete Level 5 (Walks with assist in room/hall) - Balance while stepping forward/back and can walk in room with assist - Complete Level 5 (Walks with assist in room/hall) - Balance while stepping forward/back and can walk in room with assist - Complete              Barriers to discharge: none Disposition Plan:  Home Status is: Inpt  Objective: Blood pressure 116/77, pulse 79, temperature 98.6 F (37 C), temperature source Oral, resp. rate 18, height 5\' 10"  (1.778 m), weight 79.7 kg, SpO2 99%.  Examination:  Physical Exam Constitutional:      General: He is not in acute distress.    Appearance: Normal appearance.  HENT:     Head: Normocephalic and atraumatic.     Mouth/Throat:     Mouth: Mucous membranes are moist.  Eyes:     Extraocular Movements: Extraocular movements intact.  Cardiovascular:     Rate and Rhythm: Normal rate and regular rhythm.  Pulmonary:     Effort: Pulmonary effort is normal. No respiratory distress.     Breath sounds: Normal breath sounds. No wheezing.  Abdominal:     General: Bowel sounds are normal. There is no  distension.     Palpations: Abdomen is soft.     Tenderness: There is no abdominal tenderness.  Musculoskeletal:        General: Normal range of motion.     Cervical back: Normal range of motion and neck supple.     Thoracic back: Tenderness (Surgical dressing in place with drain noted) present.  Skin:    General: Skin is warm and dry.  Neurological:     General: No focal deficit present.     Mental Status: He is alert.  Psychiatric:        Mood and Affect: Mood normal.        Behavior: Behavior normal.      Consultants:  Neurosurgery   Procedures:  03/31/2023: Decompressive laminectomy T3 extending into the inferior aspect of the 2 3 disc base to below the 3 4 disc base at the level of T4 pedicle. With partial facetectomy and evacuation of epidural tumor   Data Reviewed: No results found for this or any previous visit (from the past 24 hour(s)).  I have reviewed pertinent nursing notes, vitals, labs, and images as necessary. I have ordered labwork to follow up on as indicated.  I have reviewed the last notes from staff over past 24 hours. I have discussed patient's care plan and test results with nursing staff, CM/SW, and other staff as appropriate.    LOS: 7 days   Lewie Chamber, MD [facility] 04/04/2023, 1:38 PM

## 2023-04-04 NOTE — TOC Progression Note (Signed)
Transition of Care [facility]) - Progression Note    Patient Details  Name: Nathan Berg MRN: [id number] Date of Birth: [birth year]  Transition of Care [facility] CM/SW Contact  Kermit Balo, RN Phone Number: 04/04/2023, 1:07 PM  Clinical Narrative:     Awaiting bed availability on CIR. TOC following.  Expected Discharge Plan: IP Rehab Facility Barriers to Discharge: Continued Medical Work up  Expected Discharge Plan and Services   Discharge Planning Services: CM Consult   Living arrangements for the past 2 months: Single Family Home                                       Social Determinants of Health (SDOH) Interventions SDOH Screenings   Food Insecurity: No Food Insecurity (03/29/2023)  Housing: Low Risk  (03/29/2023)  Transportation Needs: No Transportation Needs (03/29/2023)  Utilities: Not At Risk (03/29/2023)  Social Connections: Unknown (11/16/2021)   Received from [facility]  Tobacco Use: Low Risk  (03/28/2023)    Readmission Risk Interventions     No data to display

## 2023-04-04 NOTE — Progress Notes (Signed)
Physical Therapy Treatment Patient Details Name: Nathan Berg MRN: [id number] DOB: 06-08-1956 Today's Date: 04/04/2023   History of Present Illness Pt is a 67 y/o male presenting to [facility] on 9/23 with low back pain, gait instability and falls; and transferred to [facility] due to concern of new metastatic disease to T3, T4 with associated spinal cord compression. S/P decompressive laminectomy T3 into T3 with partial facetectomy and evacuation of epidural tumor. PMH includes: arthritis, metastatic prostate cancer (with hx of chemo and radiation), hernia repair, prostatectomy.    PT Comments  Pt seen for PT tx with pt agreeable to tx. Pt is able to ambulate into hallway with RW & min assist with significantly impaired gait pattern & reliance on BUE on RW for balance/support. Pt limited by incontinence 2/2 medication. Pt is making steady gains with all mobility & is eager to return to [facility].    If plan is discharge home, recommend the following: Assistance with cooking/housework;Assist for transportation;Help with stairs or ramp for entrance;A little help with walking and/or transfers;A little help with bathing/dressing/bathroom   Can travel by private vehicle        Equipment Recommendations  BSC/3in1;Wheelchair (measurements PT);Wheelchair cushion (measurements PT)    Recommendations for Other Services Rehab consult     Precautions / Restrictions Precautions Precautions: Fall;Back Precaution Comments: no brace needed; reviewed precautions Restrictions Weight Bearing Restrictions: No     Mobility  Bed Mobility               General bed mobility comments: not tested, pt received & left in recliner    Transfers Overall transfer level: Needs assistance Equipment used: Rolling walker (2 wheels) Transfers: Sit to/from Stand Sit to Stand: Contact guard assist, Supervision           General transfer comment: STS from recliner, low toilet with grab bar with cuing for hand placement     Ambulation/Gait Ambulation/Gait assistance: Min assist Gait Distance (Feet): 30 Feet (+ 15 ft) Assistive device: Rolling walker (2 wheels) Gait Pattern/deviations: Decreased step length - right, Decreased step length - left, Decreased dorsiflexion - right, Decreased stride length, Decreased dorsiflexion - left, Scissoring Gait velocity: decreased     General Gait Details: BLE knee A/P instability, intermittent scissoring gait, cuing to ambulate within base of AD   Stairs             Wheelchair Mobility     Tilt Bed    Modified Rankin (Stroke Patients Only)       Balance Overall balance assessment: Needs assistance Sitting-balance support: No upper extremity supported, Feet supported Sitting balance-Leahy Scale: Good     Standing balance support: During functional activity, Bilateral upper extremity supported, Reliant on assistive device for balance Standing balance-Leahy Scale: Poor Standing balance comment: Pt has to lean on counter for support when performing hand hygiene.                            Cognition Arousal: Alert Behavior During Therapy: WFL for tasks assessed/performed Overall Cognitive Status: No family/caregiver present to determine baseline cognitive functioning Area of Impairment: Memory                     Memory: Decreased recall of precautions                  Exercises Other Exercises Other Exercises: Pt performs 5x STS x 2 sets from recliner without BUE support with  cuing for technique, min assist, & focus on BLE strengthening & endurance training; pt requires seated rest break between each set.    General Comments General comments (skin integrity, edema, etc.): Pt with incotinent BM & continent BM on toilet, reports it's 2/2 medication. Pt required assistance for thorough peri hygiene. Pt does not recall/adhere to back precautions & bends over to doff soiled socks. PT reviewed back precautions.       Pertinent Vitals/Pain Pain Assessment Pain Assessment: No/denies pain    Home Living                          Prior Function            PT Goals (current goals can now be found in the care plan section) Acute Rehab PT Goals Patient Stated Goal: to improve PT Goal Formulation: With patient Time For Goal Achievement: 04/15/23 Potential to Achieve Goals: Good Progress towards PT goals: Progressing toward goals    Frequency    Min 1X/week (updated to reflect new standard of care model)      PT Plan      Co-evaluation              AM-PAC PT "6 Clicks" Mobility   Outcome Measure  Help needed turning from your back to your side while in a flat bed without using bedrails?: A Little Help needed moving from lying on your back to sitting on the side of a flat bed without using bedrails?: A Little Help needed moving to and from a bed to a chair (including a wheelchair)?: A Little Help needed standing up from a chair using your arms (e.g., wheelchair or bedside chair)?: A Little Help needed to walk in hospital room?: A Little Help needed climbing 3-5 steps with a railing? : A Lot 6 Click Score: 17    End of Session Equipment Utilized During Treatment: Gait belt Activity Tolerance: Patient tolerated treatment well (limited 2/2 incontinence) Patient left: in chair;with chair alarm set;with call bell/phone within reach Nurse Communication: Mobility status PT Visit Diagnosis: Unsteadiness on feet (R26.81);Other abnormalities of gait and mobility (R26.89);Muscle weakness (generalized) (M62.81);Difficulty in walking, not elsewhere classified (R26.2);Other symptoms and signs involving the nervous system (R29.898)     Time: 4010-2725 PT Time Calculation (min) (ACUTE ONLY): 24 min  Charges:    $Therapeutic Activity: 23-37 mins PT General Charges $$ ACUTE PT VISIT: 1 Visit                     Aleda Grana, PT, DPT 04/04/23, 11:59 AM   Sandi Mariscal 04/04/2023, 11:58 AM

## 2023-04-05 ENCOUNTER — Telehealth: Payer: Self-pay

## 2023-04-05 ENCOUNTER — Other Ambulatory Visit (HOSPITAL_COMMUNITY): Payer: Self-pay

## 2023-04-05 ENCOUNTER — Ambulatory Visit
Admit: 2023-04-05 | Discharge: 2023-04-05 | Disposition: A | Discharge status: Home / Self Care | Payer: Medicare Other | Attending: Radiation Oncology | Admitting: Radiation Oncology

## 2023-04-05 DIAGNOSIS — M4854XD Collapsed vertebra, not elsewhere classified, thoracic region, subsequent encounter for fracture with routine healing: Secondary | ICD-10-CM | POA: Diagnosis not present

## 2023-04-05 DIAGNOSIS — C7949 Secondary malignant neoplasm of other parts of nervous system: Secondary | ICD-10-CM

## 2023-04-05 DIAGNOSIS — C61 Malignant neoplasm of prostate: Secondary | ICD-10-CM | POA: Diagnosis not present

## 2023-04-05 LAB — SURGICAL PATHOLOGY

## 2023-04-05 MED ORDER — LACTULOSE 10 GM/15ML PO SOLN: 20.0000 g | Freq: Two times a day (BID) | ORAL | Status: DC | PRN

## 2023-04-05 NOTE — Progress Notes (Signed)
Patient is scheduled for his PSMA PET on 10/14.   RN left message with patient providing my direct contact.  RN will follow to ensure of inpatient discharge prior to upcoming PSMA PET.

## 2023-04-05 NOTE — Plan of Care (Signed)
  Problem: Education: Goal: Knowledge of General Education information will improve Description: Including pain rating scale, medication(s)/side effects and non-pharmacologic comfort measures Outcome: Progressing   Problem: Health Behavior/Discharge Planning: Goal: Ability to manage health-related needs will improve Outcome: Progressing   Problem: Clinical Measurements: Goal: Ability to maintain clinical measurements within normal limits will improve Outcome: Progressing Goal: Will remain free from infection Outcome: Progressing Goal: Diagnostic test results will improve Outcome: Progressing Goal: Respiratory complications will improve Outcome: Progressing Goal: Cardiovascular complication will be avoided Outcome: Progressing   Problem: Activity: Goal: Risk for activity intolerance will decrease Outcome: Progressing   Problem: Nutrition: Goal: Adequate nutrition will be maintained Outcome: Progressing   Problem: Elimination: Goal: Will not experience complications related to bowel motility Outcome: Progressing Goal: Will not experience complications related to urinary retention Outcome: Progressing   Problem: Education: Goal: Ability to verbalize activity precautions or restrictions will improve Outcome: Progressing Goal: Knowledge of the prescribed therapeutic regimen will improve Outcome: Progressing Goal: Understanding of discharge needs will improve Outcome: Progressing   Problem: Activity: Goal: Ability to avoid complications of mobility impairment will improve Outcome: Progressing   Problem: Bowel/Gastric: Goal: Gastrointestinal status for postoperative course will improve Outcome: Progressing   Problem: Clinical Measurements: Goal: Ability to maintain clinical measurements within normal limits will improve Outcome: Progressing Goal: Postoperative complications will be avoided or minimized Outcome: Progressing

## 2023-04-05 NOTE — Progress Notes (Signed)
Progress Note    Nathan Berg   KGM:[id number]  DOB: 1956-01-08  DOA: 03/28/2023     8 PCP: Nathan Mood, MD  Initial CC: Back pain  Hospital Course: Nathan Berg is a 67 yo male with PMH metastatic prostate cancer to the bone, GAD who presented with worsening back pain. He had undergone outpatient workup with [facility] on 03/25/2023 with MRI T-spine which showed concern for new metastatic disease involving thoracic spine with concern for cord compression.  He was referred to the ER. MRI thoracic spine repeated on admission and evaluated by surgery.  He was also started on steroids on admission.  Interval History:  Current pain regimen helping better and he is more comfortable.  Stools also not as frequent. He understands plan is for discussing with radiation oncology on approximately Thursday to determine if getting inpatient radiation versus outpatient.  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 Epidural extension of tumor at these levels with resultant severe spinal stenosis and cord compression at the level of T3. Probable subtle early/mild cord signal changes at this level. 2. Multiple additional smaller osseous metastases scattered elsewhere throughout the thoracic spine. No other significant extra osseous or epidural extension of tumor." --- Neurosurgery following. Underwent "Decompressive laminectomy T3 extending into the inferior aspect of the 2 3 disc base to below the 3 4 disc base at the level of T4 pedicle. With partial facetectomy and evacuation of epidural tumor " on 03/31/23 - continue with PT/OT; possible CIR placement  -Continue Decadron per neurosurgery - pain improved with regimen adjustment; continue as is for now - JP drain removed by neurosurgery 9/29 - await further recs from  radiation-oncology on whether or not to do inpatient radiation (would likely delay CIR) vs outpatient radiation after CIR; CIR unable to determine candidacy without this info as well. Patient to be evaluated by Dr. 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 Mosetta Putt; transitioning back to [facility]  GAD (generalized anxiety disorder) - Continue Xanax and Lexapro    Old records reviewed in assessment of this patient  Antimicrobials:   DVT prophylaxis:  SCD's Start: 03/31/23 1752 SCDs Start: 03/28/23 1958   Code Status:   Code Status: Limited: Do not attempt resuscitation (DNR) -DNR-LIMITED -Do Not Intubate/DNI   Mobility Assessment (Last 72 Hours)     Mobility Assessment     Row Name 04/05/23 1343 04/04/23 2000 04/04/23 1200 04/04/23 1156 04/04/23 0800   Does patient have an order for bedrest or is patient medically unstable -- No - Continue assessment No - Continue assessment -- No - Continue assessment   What is the highest level of mobility based on the progressive mobility assessment? Level 5 (Walks with assist in room/hall) - Balance while stepping forward/back and can walk in room with assist - Complete Level 5 (Walks with assist in room/hall) - Balance while stepping forward/back and can walk in room with assist - Complete Level 5 (Walks with assist in room/hall) - Balance while stepping forward/back and can walk in room with assist - Complete Level 5 (Walks with assist in room/hall) - Balance while stepping forward/back and can walk in room with assist - Complete Level 5 (Walks  with assist in room/hall) - Balance while stepping forward/back and can walk in room with assist - Complete    Row Name 04/04/23 0000 04/03/23 2000 04/03/23 1226 04/03/23 0800 04/03/23 0000   Does patient have an order for bedrest  or is patient medically unstable No - Continue assessment No - Continue assessment No - Continue assessment No - Continue assessment No - Continue assessment   What is the highest level of mobility based on the progressive mobility assessment? Level 4 (Walks with assist in room) - Balance while marching in place and cannot step forward and back - Complete Level 4 (Walks with assist in room) - Balance while marching in place and cannot step forward and back - Complete Level 5 (Walks with assist in room/hall) - Balance while stepping forward/back and can walk in room with assist - Complete Level 4 (Walks with assist in room) - Balance while marching in place and cannot step forward and back - Complete Level 4 (Walks with assist in room) - Balance while marching in place and cannot step forward and back - Complete    Row Name 04/02/23 2000 04/02/23 1530         Does patient have an order for bedrest or is patient medically unstable No - Continue assessment No - Continue assessment      What is the highest level of mobility based on the progressive mobility assessment? Level 4 (Walks with assist in room) - Balance while marching in place and cannot step forward and back - Complete Level 4 (Walks with assist in room) - Balance while marching in place and cannot step forward and back - Complete               Barriers to discharge: none Disposition Plan:  Home Status is: Inpt  Objective: Blood pressure 107/87, pulse 79, temperature 98 F (36.7 C), temperature source Oral, resp. rate 18, height 5\' 10"  (1.778 m), weight 82.2 kg, SpO2 99%.  Examination:  Physical Exam Constitutional:      General: He is not in acute distress.    Appearance: Normal appearance.  HENT:     Head: Normocephalic and atraumatic.     Mouth/Throat:     Mouth: Mucous membranes are moist.  Eyes:     Extraocular Movements: Extraocular movements intact.  Cardiovascular:     Rate and Rhythm: Normal rate and regular  rhythm.  Pulmonary:     Effort: Pulmonary effort is normal. No respiratory distress.     Breath sounds: Normal breath sounds. No wheezing.  Abdominal:     General: Bowel sounds are normal. There is no distension.     Palpations: Abdomen is soft.     Tenderness: There is no abdominal tenderness.  Musculoskeletal:        General: Normal range of motion.     Cervical back: Normal range of motion and neck supple.     Thoracic back: Tenderness (Surgical dressing in place with drain noted) present.  Skin:    General: Skin is warm and dry.  Neurological:     General: No focal deficit present.     Mental Status: He is alert.  Psychiatric:        Berg and Affect: Berg normal.        Behavior: Behavior normal.      Consultants:  Neurosurgery   Procedures:  03/31/2023: Decompressive laminectomy T3 extending into the inferior aspect of the 2 3 disc base to below the 3 4 disc  base at the level of T4 pedicle. With partial facetectomy and evacuation of epidural tumor   Data Reviewed: No results found for this or any previous visit (from the past 24 hour(s)).   I have reviewed pertinent nursing notes, vitals, labs, and images as necessary. I have ordered labwork to follow up on as indicated.  I have reviewed the last notes from staff over past 24 hours. I have discussed patient's care plan and test results with nursing staff, CM/SW, and other staff as appropriate.    LOS: 8 days   Lewie Chamber, MD [facility] 04/05/2023, 2:21 PM

## 2023-04-05 NOTE — Progress Notes (Signed)
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 S/P decompressive laminectomy T3 into T3 with partial facetectomy and evacuation of epidural tumor. PMH includes: arthritis, metastatic prostate cancer (with hx of chemo and radiation), hernia repair, prostatectomy.    PT Comments  Pt progressing towards physical therapy goals. Lunch tray arrived at start of session but pt agreeable to use the bathroom, wash hands, and ambulate a short distance before eating. Pt motivated for distance but demonstrated appropriate self-monitoring for fatigue. Gait deficits present and pt reports they are unchanged since surgery. Noted continued scissoring and B LE instability with gait attempts. Continue to recommend continued post-acute rehab >3 hours/day to maximize functional independence, safety, and return to PLOF.    If plan is discharge home, recommend the following: Assistance with cooking/housework;Assist for transportation;Help with stairs or ramp for entrance;A little help with walking and/or transfers;A little help with bathing/dressing/bathroom   Can travel by private vehicle        Equipment Recommendations  BSC/3in1;Wheelchair (measurements PT);Wheelchair cushion (measurements PT)    Recommendations for Other Services Rehab consult     Precautions / Restrictions Precautions Precautions: Fall;Back Precaution Booklet Issued: No Precaution Comments: no brace needed; reviewed precautions Required Braces or Orthoses:  (no brace needed orders) Restrictions Weight Bearing Restrictions: No     Mobility  Bed Mobility               General bed mobility comments: Pt received sitting up  in the recliner    Transfers Overall transfer level: Needs assistance Equipment used: Rolling walker (2 wheels) Transfers: Sit to/from Stand Sit to Stand: Contact guard assist, Supervision           General transfer comment: VC's for hand placement on seated surface for safety and maintenance of back precautions. Pt with significantly flexed trunk when standing from low toilet height.    Ambulation/Gait Ambulation/Gait assistance: Min assist Gait Distance (Feet): 60 Feet Assistive device: Rolling walker (2 wheels) Gait Pattern/deviations: Decreased step length - right, Decreased step length - left, Decreased dorsiflexion - right, Decreased stride length, Decreased dorsiflexion - left, Scissoring Gait velocity: decreased Gait velocity interpretation: <1.31 ft/sec, indicative of household ambulator   General Gait Details: B LE instability with minor buckling noted and intermittent scissoring gait. VC's throughout for improved posture, closer walker proximity and forward gaze.   Stairs             Wheelchair Mobility     Tilt Bed    Modified Rankin (Stroke Patients Only)       Balance Overall balance assessment: Needs assistance Sitting-balance support: No upper extremity supported, Feet supported Sitting balance-Leahy Scale: Good     Standing balance support: During functional activity, Bilateral upper extremity supported, Reliant on assistive device for balance Standing balance-Leahy Scale: Poor Standing balance comment: Pt has to lean on counter with anterior hips for support when performing hand hygiene.                            Cognition Arousal: Alert Behavior During Therapy: WFL for tasks assessed/performed Overall Cognitive Status: Impaired/Different from baseline Area of Impairment: Memory, Problem solving  Memory: Decreased recall of precautions       Problem Solving: Difficulty sequencing, Requires  verbal cues General Comments: While washing hands, pt appeared to have difficulty sequencing. He lathered soap on his hands and before rinsing, got paper towels and began drying hands off. When cued that he still had soap on his hands, he could rinse and dry hands appropriately.        Exercises      General Comments        Pertinent Vitals/Pain Pain Assessment Pain Assessment: No/denies pain Faces Pain Scale: Hurts a little bit Pain Location: incisonal Pain Descriptors / Indicators: Discomfort Pain Intervention(s): Limited activity within patient's tolerance, Monitored during session, Repositioned    Home Living                          Prior Function            PT Goals (current goals can now be found in the care plan section) Acute Rehab PT Goals Patient Stated Goal: Be able to go home. PT Goal Formulation: With patient Time For Goal Achievement: 04/15/23 Potential to Achieve Goals: Good Progress towards PT goals: Progressing toward goals    Frequency    Min 1X/week      PT Plan      Co-evaluation              AM-PAC PT "6 Clicks" Mobility   Outcome Measure  Help needed turning from your back to your side while in a flat bed without using bedrails?: A Little Help needed moving from lying on your back to sitting on the side of a flat bed without using bedrails?: A Little Help needed moving to and from a bed to a chair (including a wheelchair)?: A Little Help needed standing up from a chair using your arms (e.g., wheelchair or bedside chair)?: A Little Help needed to walk in hospital room?: A Little Help needed climbing 3-5 steps with a railing? : A Lot 6 Click Score: 17    End of Session Equipment Utilized During Treatment: Gait belt Activity Tolerance: Patient tolerated treatment well Patient left: in chair;with chair alarm set;with call bell/phone within reach Nurse Communication: Mobility status PT Visit Diagnosis: Unsteadiness on  feet (R26.81);Other abnormalities of gait and mobility (R26.89);Muscle weakness (generalized) (M62.81);Difficulty in walking, not elsewhere classified (R26.2);Other symptoms and signs involving the nervous system (R29.898)     Time: 1323-1340 PT Time Calculation (min) (ACUTE ONLY): 17 min  Charges:    $Gait Training: 8-22 mins PT General Charges $$ ACUTE PT VISIT: 1 Visit                     Conni Slipper, PT, DPT Acute Rehabilitation Services Secure Chat Preferred Office: [phone]    Marylynn Pearson 04/05/2023, 2:00 PM

## 2023-04-05 NOTE — Consult Note (Signed)
Radiation Oncology         [phone]) [phone] ________________________________  Initial inpatient Consultation -via telephone  I connected with  Nathan Berg on 04/05/23 by a video enabled telemedicine application and verified that I am speaking with the correct person using two identifiers.   I discussed the limitations of evaluation and management by telemedicine. The patient expressed understanding and agreed to proceed.   Name: Nathan Berg MRN: [id number]  Date of Service: 03/28/2023 DOB: 09/14/1955  WJ:XBJY, Nathan Arabia, MD  No ref. provider found   REFERRING PHYSICIAN: Dr. 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 Weakness  R53.1     2. Pathological compression fracture of thoracic vertebra [facility])  M48.54XA       HISTORY OF PRESENT ILLNESS: Nathan Berg is a 67 y.o. male seen at the request of Dr. Wynetta Emery. He is well known to our service, having previously received salvage radiation to the prostate fossa in 2015, stereotactic body radiatiotherapy (SBRT) to oligometastatic disease in the pelvis (02/2020) and palliative radiation to the thoracic spine (T3) and 1st rib in 04/2022.  He has continued on active treatment under the care of medical oncology, most recently on Lupron ADT and Xtandi, having previously failed [place]. He was recently admitted to the hospital on 03/28/23 with new onset generalized fatigue and bilateral lower extremity weakness as well as progressive upper-mid back pain, between his shoulder blades wrapping around to chest, over the past couple of months. He had seen a spine specialist recently and had an MRI Thoracic spine at [facility] on 03/22/23 and was told that he would need to see a neurosurgeon. Due to his gait instability and lower extremity weakness, he presented to the ED for evaluation. A repeat MRI T-spine performed on admission showed osseous metastases involving the T3 and T4  vertebral bodies with associated pathologic compression fractures and epidural extension of tumor at these levels with resultant severe spinal stenosis and cord compression at the level of T3.  There were multiple additional smaller osseous metastases scattered elsewhere throughout the thoracic spine but no other significant extraosseous or epidural extension of tumor.  He was seen by neurosurgeon, Dr. Wynetta Emery, who recommended decompressive thoracic laminectomy at T3, inclusive of part of T4.  He underwent decompressive laminectomy at T3, extending into the inferior aspect of the T2-T3 disc base to below the T3-4 disc base at the level of the T4 pedicle, with partial facetectomy and evacuation of epidural tumor on 03/31/2023. The patient reports significant pain relief since that time but has continued with weakness in the lower extremities and is awaiting a bed in inpatient rehab so that he can continue working with physical therapy.  We have been asked to consult the patient today for consideration of postoperative re-irradiation to the progressive disease at T3-T4.  PREVIOUS RADIATION THERAPY: Yes  04/26/22 - 05/07/22:  The painful metastases in the 1st rib and T# vertebral body were treated to 30 Gy in 10 fractions.    02/13/20 - 02/22/20:  The right iliac crest and right internal iliac node targets were treated to 50 Gy in 5 fractions of 10 Gy each   05/06/14 - 06/26/14: Prostatic Fossa / 68.4 Gy in 38 fractions of 1.8 Gy (in combination with ST-ADT)  PAST MEDICAL HISTORY:  Past Medical History:  Diagnosis Date   Anxiety    Arthritis    Cancer (  HCC)    Prostate cancer   Depression    GERD (gastroesophageal reflux disease)    H/O seasonal allergies    History of kidney stones    x1   Prostate cancer (HCC)       PAST SURGICAL HISTORY: Past Surgical History:  Procedure Laterality Date   CHEST TUBE INSERTION  07/16/2019   trauma    COLONOSCOPY     HERNIA REPAIR Bilateral    20 yrs ago    LAMINECTOMY N/A 03/31/2023   Procedure: THORACIC LAMINECTOMY FOR TUMOR;  Surgeon: Donalee Citrin, MD;  Location: [facility] OR;  Service: Neurosurgery;  Laterality: N/A;   LYMPHADENECTOMY Bilateral 09/19/2013   Procedure: LYMPHADENECTOMY  PELVIC LYMPH NODE DISSECTION;  Surgeon: Valetta Fuller, MD;  Location: [facility] ORS;  Service: Urology;  Laterality: Bilateral;   PROSTATE BIOPSY     ROBOT ASSISTED LAPAROSCOPIC RADICAL PROSTATECTOMY N/A 09/19/2013   Procedure: ROBOTIC ASSISTED LAPAROSCOPIC RADICAL PROSTATECTOMY;  Surgeon: Valetta Fuller, MD;  Location: [facility] ORS;  Service: Urology;  Laterality: N/A;   TONSILLECTOMY     child   VARICOCELE EXCISION      FAMILY HISTORY:  Family History  Problem Relation Age of Onset   Prostate cancer Father    Breast cancer Neg Hx    Colon cancer Neg Hx    Pancreatic cancer Neg Hx     SOCIAL HISTORY:  Social History   Socioeconomic History   Marital status: Married    Spouse name: Not on file   Number of children: Not on file   Years of education: Not on file   Highest education level: Not on file  Occupational History   Not on file  Tobacco Use   Smoking status: Never   Smokeless tobacco: Never  Vaping Use   Vaping status: Never Used  Substance and Sexual Activity   Alcohol use: Yes    Comment: 4 beers per week   Drug use: No   Sexual activity: Yes  Other Topics Concern   Not on file  Social History Narrative   Not on file   Social Determinants of Health   Financial Resource Strain: Not on file  Food Insecurity: No Food Insecurity (03/29/2023)   Hunger Vital Sign    Worried About Running Out of Food in the Last Year: Never true    Ran Out of Food in the Last Year: Never true  Transportation Needs: No Transportation Needs (03/29/2023)   PRAPARE - Administrator, Civil Service (Medical): No    Lack of Transportation (Non-Medical): No  Physical Activity: Not on file  Stress: Not on file  Social Connections: Unknown (11/16/2021)   Received  from [facility], [facility]   Social Network    Social Network: Not on file  Intimate Partner Violence: Not At Risk (03/29/2023)   Humiliation, Afraid, Rape, and Kick questionnaire    Fear of Current or Ex-Partner: No    Emotionally Abused: No    Physically Abused: No    Sexually Abused: No    ALLERGIES: Codeine and Penicillin g  MEDICATIONS:  Current Facility-Administered Medications  Medication Dose Route Frequency Provider Last Rate Last Admin   0.9 %  sodium chloride infusion  250 mL Intravenous Continuous Donalee Citrin, MD       acetaminophen (TYLENOL) tablet 650 mg  650 mg Oral QID Lewie Chamber, MD   650 mg at 04/05/23 0814   ALPRAZolam Prudy Feeler) tablet 0.25 mg  0.25 mg Oral BID PRN Wynetta Emery,  Jillyn Hidden, MD   0.25 mg at 04/04/23 2142   alum & mag hydroxide-simeth (MAALOX/MYLANTA) 200-200-20 MG/5ML suspension 30 mL  30 mL Oral Q6H PRN Donalee Citrin, MD       cyclobenzaprine (FLEXERIL) tablet 10 mg  10 mg Oral TID PRN Donalee Citrin, MD   10 mg at 04/05/23 0814   dexamethasone (DECADRON) injection 10 mg  10 mg Intravenous Q24H Donalee Citrin, MD   10 mg at 04/05/23 1610   enzalutamide Diana Eves) tablet 160 mg  160 mg Oral Daily Donalee Citrin, MD   160 mg at 04/05/23 1051   escitalopram (LEXAPRO) tablet 20 mg  20 mg Oral Daily Donalee Citrin, MD   20 mg at 04/05/23 0814   fentaNYL (DURAGESIC) 12 MCG/HR 1 patch  1 patch Transdermal Christin Fudge, MD   1 patch at 04/04/23 0000   gabapentin (NEURONTIN) capsule 100 mg  100 mg Oral TID Donalee Citrin, MD   100 mg at 04/05/23 0814   HYDROmorphone (DILAUDID) injection 1 mg  1 mg Intravenous Q3H PRN Donalee Citrin, MD   1 mg at 04/05/23 0856   ketorolac (TORADOL) 30 MG/ML injection 30 mg  30 mg Intravenous Q6H Lewie Chamber, MD   30 mg at 04/05/23 1211   lactulose (CHRONULAC) 10 GM/15ML solution 20 g  20 g Oral BID PRN Lewie Chamber, MD       lidocaine (LIDODERM) 5 % 1 patch  1 patch Transdermal Q24H Donalee Citrin, MD   1 patch at 04/04/23 2143   melatonin tablet 3 mg  3  mg Oral QHS PRN Donalee Citrin, MD   3 mg at 04/04/23 2142   menthol-cetylpyridinium (CEPACOL) lozenge 3 mg  1 lozenge Oral PRN Donalee Citrin, MD       Or   phenol (CHLORASEPTIC) mouth spray 1 spray  1 spray Mouth/Throat PRN Donalee Citrin, MD       mupirocin ointment (BACTROBAN) 2 % 1 Application  1 Application Nasal BID Lewie Chamber, MD   1 Application at 04/05/23 0815   naloxone [facility]) injection 0.4 mg  0.4 mg Intravenous PRN Donalee Citrin, MD       ondansetron [facility]) injection 4 mg  4 mg Intravenous Q6H PRN Donalee Citrin, MD       ondansetron [facility]) tablet 4 mg  4 mg Oral Q6H PRN Donalee Citrin, MD       Or   ondansetron [facility]) injection 4 mg  4 mg Intravenous Q6H PRN Donalee Citrin, MD       oxyCODONE (Oxy IR/ROXICODONE) immediate release tablet 5 mg  5 mg Oral Q4H PRN Lewie Chamber, MD   5 mg at 04/05/23 9604   pantoprazole (PROTONIX) EC tablet 40 mg  40 mg Oral Axel Filler, MD   40 mg at 04/04/23 2142   polyethylene glycol (MIRALAX / GLYCOLAX) packet 17 g  17 g Oral Daily PRN Lewie Chamber, MD       senna-docusate (Senokot-S) tablet 1 tablet  1 tablet Oral BID Lewie Chamber, MD   1 tablet at 04/04/23 2142   sodium chloride flush (NS) 0.9 % injection 3 mL  3 mL Intravenous Q12H Donalee Citrin, MD   3 mL at 04/05/23 0856   sodium chloride flush (NS) 0.9 % injection 3 mL  3 mL Intravenous PRN Donalee Citrin, MD        REVIEW OF SYSTEMS:  On review of systems, the patient reports that he is doing fairly well in general with much improved pain  since admission.  He currently denies any chest pain, shortness of breath, cough, fevers, chills, night sweats, or recent unintended weight changes. He denies any bowel or bladder disturbances, and denies abdominal pain, nausea or vomiting. He denies any new musculoskeletal or joint aches or pains and no new neurological symptoms aside from that mentioned above in the HPI. A complete review of systems is obtained and is otherwise negative.    PHYSICAL EXAM:  Wt  Readings from Last 3 Encounters:  04/05/23 181 lb 3.5 oz (82.2 kg)  02/16/23 182 lb 1.6 oz (82.6 kg)  12/23/22 184 lb 3.2 oz (83.6 kg)   Temp Readings from Last 3 Encounters:  04/05/23 98 F (36.7 C) (Oral)  02/16/23 98.4 F (36.9 C) (Oral)  12/23/22 98.4 F (36.9 C) (Oral)   BP Readings from Last 3 Encounters:  04/05/23 107/87  02/16/23 115/74  12/23/22 110/79   Pulse Readings from Last 3 Encounters:  04/05/23 79  02/16/23 69  12/23/22 80   Pain Assessment Pain Score: 4 /10  Unable to assess due to telephone consult visit format.   KPS = 50  100 - Normal; no complaints; no evidence of disease. 90   - Able to carry on normal activity; minor signs or symptoms of disease. 80   - Normal activity with effort; some signs or symptoms of disease. 62   - Cares for self; unable to carry on normal activity or to do active work. 60   - Requires occasional assistance, but is able to care for most of his personal needs. 50   - Requires considerable assistance and frequent medical care. 40   - Disabled; requires special care and assistance. 30   - Severely disabled; hospital admission is indicated although death not imminent. 20   - Very sick; hospital admission necessary; active supportive treatment necessary. 10   - Moribund; fatal processes progressing rapidly. 0     - Dead  Karnofsky DA, Abelmann WH, Craver LS and Burchenal JH [phone]) The use of the nitrogen mustards in the palliative treatment of carcinoma: with particular reference to bronchogenic carcinoma Cancer 1 634-56  LABORATORY DATA:  Lab Results  Component Value Date   WBC 5.9 03/30/2023   HGB 11.9 (L) 03/30/2023   HCT 34.9 (L) 03/30/2023   MCV 97.8 03/30/2023   PLT 341 03/30/2023   Lab Results  Component Value Date   NA 136 03/30/2023   K 3.7 03/30/2023   CL 100 03/30/2023   CO2 27 03/30/2023   Lab Results  Component Value Date   ALT 32 03/28/2023   AST 27 03/28/2023   ALKPHOS 99 03/28/2023   BILITOT  0.6 03/28/2023     RADIOGRAPHY: DG Thoracic Spine 1 View  Result Date: 03/31/2023 CLINICAL DATA:  Thoracic laminectomy for tumor. EXAM: OPERATIVE THORACIC SPINE 1 VIEW(S) COMPARISON:  03/30/2023. FINDINGS: A single fluoroscopic image was obtained intraoperatively for thoracic laminectomy. Total fluoroscopy time is 2.9 seconds. Dose: 0.5 mGy. Please see operative report for additional information. IMPRESSION: Intraoperative utilization of fluoroscopy. Electronically Signed   By: Thornell Sartorius M.D.   On: 03/31/2023 20:01   DG C-Arm 1-60 Min-No Report  Result Date: 03/31/2023 Fluoroscopy was utilized by the requesting physician.  No radiographic interpretation.   MR OUTSIDE FILMS SPINE  Result Date: 03/30/2023 This examination belongs to an outside facility and is stored here for comparison purposes only.  Contact the originating outside institution for any associated report or interpretation.  CT THORACIC SPINE WO CONTRAST  Result Date: 03/30/2023 CLINICAL DATA:  Thoracic compression fracture.  Known malignancy. EXAM: CT THORACIC SPINE WITHOUT CONTRAST TECHNIQUE: Multidetector CT images of the thoracic were obtained using the standard protocol without intravenous contrast. RADIATION DOSE REDUCTION: This exam was performed according to the departmental dose-optimization program which includes automated exposure control, adjustment of the mA and/or kV according to patient size and/or use of iterative reconstruction technique. COMPARISON:  Thoracic spine MRI 1 day prior FINDINGS: Alignment: Normal. Vertebrae: Again seen is marked compression deformity of the T3 vertebral body with near-complete loss of vertebral body height and 4 mm bony retropulsion consistent with pathologic fracture in the setting of osseous metastatic disease. There is also a nondisplaced fracture through the right T3 lamina and transverse process extending to the inferior articulating process (5-23, 7-37). Also again seen is the  more mild pathologic compression fracture of the T4 vertebral body with approximately 40% loss of vertebral body height anteriorly and no bony retropulsion. There is a nondisplaced fracture of the right T4 transverse process (5-31, 7-32). The other vertebral body heights are preserved. The bones are diffusely demineralized. Known additional metastatic lesions as well as epidural tumor at T3 and T4 are better seen on the thoracic spine MRI from 1 day prior. Paraspinal and other soft tissues: Nodular scarring in the medial right lung apex is unchanged. A probable sebaceous cyst is noted in the subcutaneous fat just to the left of midline at the T1 level. A large hiatal hernia is unchanged. Disc levels: There is overall mild underlying degenerative change throughout the thoracic spine. Known severe spinal canal stenosis at T3 is better seen on the MRI. IMPRESSION: 1. Pathologic compression fractures of the T3 and T4 vertebral bodies are similar to the prior MRI, worse at T3 where there is essentially complete loss of vertebral body height and bony retropulsion. Known severe spinal canal stenosis due to the retropulsion and epidural tumor is better seen on the prior MRI. 2. Additional nondisplaced fractures of the right T3 lamina, transverse process, and inferior articulating process, and right T4 transverse process. Electronically Signed   By: Lesia Hausen M.D.   On: 03/30/2023 13:50   MR THORACIC SPINE W WO CONTRAST  Result Date: 03/30/2023 CLINICAL DATA:  Initial evaluation for musculoskeletal neoplasm, history of prostate cancer. EXAM: MRI THORACIC WITHOUT AND WITH CONTRAST TECHNIQUE: Multiplanar and multiecho pulse sequences of the thoracic spine were obtained without and with intravenous contrast. CONTRAST:  9mL GADAVIST GADOBUTROL 1 MMOL/ML IV SOLN COMPARISON:  Prior study from 12/09/2022. FINDINGS: Alignment: Vertebral bodies normally aligned with preservation of the normal thoracic kyphosis. Trace  degenerative retrolisthesis of L1 on L2 noted. Vertebrae: Osseous metastasis involving the T3 vertebral body is seen. Associated pathologic compression fracture with vertebral plana formation and up to 6 mm of bony retropulsion. Additional osseous metastasis involves the entirety of the T4 vertebral body. Associated 40% height loss without significant bony retropulsion. Tumor extends to involve the posterior elements bilaterally. Epidural extension of tumor at these levels with tumor within the ventral epidural space, most pronounced at T3 where there is resultant severe spinal stenosis and cord compression (series 10, image 17). Secondary cord flattening with suspected early/mild cord signal changes (series 4, image 13). Thecal sac measures approximately 4-5 mm in AP diameter. No more than mild stenosis inferiorly at the level of T4. Moderate to severe bilateral foraminal narrowing noted at T2-3 and T3-4 as well. Multiple additional smaller osseous metastases seen scattered elsewhere throughout the thoracic spine. For reference purposes,  the largest additional lesion involves the inferior aspect of T2 and measures 1.5 cm (series 5, image 12). No other significant extra osseous or epidural extension of tumor. No other pathologic compression fracture. Few additional scattered osseous metastases noted within the cervical spine on counter sequence. No visible extra osseous or epidural extension of tumor within the cervical spine itself. Cord: Cord impingement with suspected subtle early cord signal changes at the level of T3 as above. Otherwise normal signal and morphology. Paraspinal and other soft tissues: Paraspinous soft tissues demonstrate no acute finding. Small layering left pleural effusion noted. Disc levels: Underlying mild for age multilevel thoracic spondylosis elsewhere throughout the thoracic spine. Scattered superimposed multilevel facet disease. No other significant spinal stenosis. Mild multilevel  foraminal narrowing noted within the lower thoracic spine at T10-11 bilaterally and T12-L1 on the left. IMPRESSION: 1. Osseous metastases involving the T3 and T4 vertebral bodies with associated pathologic compression fractures as above. Epidural extension of tumor at these levels with resultant severe spinal stenosis and cord compression at the level of T3. Probable subtle early/mild cord signal changes at this level. 2. Multiple additional smaller osseous metastases scattered elsewhere throughout the thoracic spine. No other significant extra osseous or epidural extension of tumor. 3. Underlying mild for age thoracic spondylosis without other high-grade spinal stenosis. Mild multilevel foraminal narrowing within the lower thoracic spine as above. 4. Small layering left pleural effusion. Electronically Signed   By: Rise Mu M.D.   On: 03/30/2023 04:25   DG Chest 2 View  Result Date: 03/09/2023 CLINICAL DATA:  Chest pain.  History of prostate cancer. EXAM: CHEST - 2 VIEW COMPARISON:  October 22, 2022.  Nov 14, 2022. FINDINGS: The heart size and mediastinal contours are within normal limits. Both lungs are clear. Elevated right hemidiaphragm is noted. Severe compression deformity of upper thoracic vertebral body is noted consistent with metastatic disease as noted on prior PET scan. Hiatal hernia is noted. IMPRESSION: No acute cardiopulmonary abnormality is seen. Hiatal hernia. Compression deformity of upper thoracic vertebral body as noted above. Electronically Signed   By: Lupita Raider M.D.   On: 03/09/2023 13:59      IMPRESSION/PLAN: 1. 67 y.o. 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 Today, I talked to the patient about the findings and workup thus far. We discussed the natural history of metastatic prostate cancer and general treatment, highlighting the role of radiotherapy in the management. Dr. Kathrynn Running has personally reviewed his imaging and recommends  proceeding with postoperative re-irradiation at T3-T4 to prevent regrowth of tumor at this level. We discussed the available radiation techniques, and focused on the details and logistics of delivery. The patient understands that Dr. Kathrynn Running si currently away at ASTRO meeting but will return on Thursday 04/07/23 to further review his prior treatment plans and finalize a plan for SRS vs conventional postoperative re-irradiation and we will discuss this further when we obtain consent at time of CT SIM/treatment planning. We reviewed the anticipated acute and late sequelae associated with radiation which he is very familiar with. The patient was encouraged to ask questions that were answered to his stated satisfaction and he is in agreement with the stated plan. I will share this discussion with his treatment team and we will coordinate for CT SIM later this week or early next week, in anticipation of beginning treatment around the week of 04/18/23, to allow for postoperative healing. We will coordinate with the inpatient rehab team regarding SIM and treatment  appointments that are convenient to their schedule in an effort to limit any disruption to his rehab program as much  absolutely possible. I enjoyed meeting with him again today and he knows that he is welcome to call with any questions or concerns in the interim.  I personally spent 70 minutes in this encounter including chart review, reviewing radiological studies, telephone conversation with the patient, entering orders, coordinating care and completing documentation.    Marguarite Arbour, PA-C    Margaretmary Dys, MD  [facility]  Radiation Oncology Direct Dial: [phone]  Fax: [phone] [email or web address]  Skype  LinkedIn

## 2023-04-05 NOTE — Telephone Encounter (Signed)
LM for pt and spouse to call back. We would like to set up CT Simulation early next week. Pt currently inpt and will be moving to CIR soon

## 2023-04-05 NOTE — Progress Notes (Signed)
Inpatient Rehab Admissions Coordinator:    CIR following. Await recommendations from radiation oncology and neurosurgery regarding future needs for RXT and/or further spinal surgery. CIR will not admit until plan is clarified.   Megan Salon, MS, CCC-SLP Rehab Admissions Coordinator  [phone] (celll) [phone] (office)

## 2023-04-05 NOTE — Progress Notes (Signed)
Occupational Therapy Treatment Patient Details Name: Nathan Berg MRN: [id number] DOB: [birth year] Today's Date: 04/05/2023   History of present illness Pt is a 67 y/o male presenting to [facility] on 9/23 with low back pain, gait instability and falls; and transferred to [facility] due to concern of new metastatic disease to T3, T4 with associated spinal cord compression. S/P decompressive laminectomy T3 into T3 with partial facetectomy and evacuation of epidural tumor. PMH includes: arthritis, metastatic prostate cancer (with hx of chemo and radiation), hernia repair, prostatectomy.   OT comments  Pt progressing towards goals this session, completing LB ADL and standing grooming task with min A, pt with instability with standing, reports feeling "wobbly" and needing x1 seated rest break prior to ambulating back to chair. Reviewed back precautions with pt, discussed figure 4 and log roll techniques as compensatory strategies and pt verbalized understanding. Pt presenting with impairments listed below, will follow acutely. Patient will benefit from intensive inpatient follow up therapy, >3 hours/day to maximize safety/ind with ADLs/functional mobility.       If plan is discharge home, recommend the following:  A lot of help with walking and/or transfers;A little help with bathing/dressing/bathroom;Assistance with cooking/housework;Assist for transportation;Help with stairs or ramp for entrance   Equipment Recommendations  Other (comment) (defer)    Recommendations for Other Services Rehab consult    Precautions / Restrictions Precautions Precautions: Fall;Back Precaution Booklet Issued: No Precaution Comments: no brace needed; reviewed precautions Required Braces or Orthoses:  (no brace needed orders) Restrictions Weight Bearing Restrictions: No       Mobility Bed Mobility               General bed mobility comments: Pt received sitting up in the recliner    Transfers Overall transfer  level: Needs assistance Equipment used: Rolling walker (2 wheels) Transfers: Sit to/from Stand Sit to Stand: Min assist                 Balance Overall balance assessment: Needs assistance Sitting-balance support: No upper extremity supported, Feet supported Sitting balance-Leahy Scale: Good     Standing balance support: During functional activity, Bilateral upper extremity supported, Reliant on assistive device for balance Standing balance-Leahy Scale: Poor                             ADL either performed or assessed with clinical judgement   ADL Overall ADL's : Needs assistance/impaired     Grooming: Oral care;Wash/dry face;Standing Grooming Details (indicate cue type and reason): standing at sink             Lower Body Dressing: Minimal assistance;Sitting/lateral leans Lower Body Dressing Details (indicate cue type and reason): figure 4 for donning/doffing shoes Toilet Transfer: Minimal assistance;Rolling walker (2 wheels)           Functional mobility during ADLs: Minimal assistance;Rolling walker (2 wheels)      Extremity/Trunk Assessment Upper Extremity Assessment Upper Extremity Assessment: Generalized weakness   Lower Extremity Assessment Lower Extremity Assessment: Defer to PT evaluation        Vision   Vision Assessment?: No apparent visual deficits   Perception Perception Perception: Not tested   Praxis Praxis Praxis: Not tested    Cognition Arousal: Alert Behavior During Therapy: WFL for tasks assessed/performed Overall Cognitive Status: Impaired/Different from baseline Area of Impairment: Memory, Problem solving  Memory: Decreased recall of precautions       Problem Solving: Difficulty sequencing, Requires verbal cues          Exercises      Shoulder Instructions       General Comments VSS    Pertinent Vitals/ Pain       Pain Assessment Pain Assessment: Faces Pain Score: 2   Faces Pain Scale: Hurts a little bit Pain Location: incisonal Pain Descriptors / Indicators: Discomfort Pain Intervention(s): Limited activity within patient's tolerance, Monitored during session, Repositioned  Home Living                                          Prior Functioning/Environment              Frequency  Min 1X/week        Progress Toward Goals  OT Goals(current goals can now be found in the care plan section)  Progress towards OT goals: Progressing toward goals  Acute Rehab OT Goals Patient Stated Goal: none stated OT Goal Formulation: With patient Time For Goal Achievement: 04/15/23 Potential to Achieve Goals: Good ADL Goals Pt Will Perform Grooming: with modified independence;standing Pt Will Perform Lower Body Dressing: with modified independence;sit to/from stand Pt Will Transfer to Toilet: with modified independence;ambulating Pt Will Perform Toileting - Clothing Manipulation and hygiene: with modified independence;sit to/from stand Pt Will Perform Tub/Shower Transfer: Shower transfer;with modified independence;ambulating;shower seat;rolling walker;grab bars  Plan      Co-evaluation                 AM-PAC OT "6 Clicks" Daily Activity     Outcome Measure   Help from another person eating meals?: A Little Help from another person taking care of personal grooming?: A Little Help from another person toileting, which includes using toliet, bedpan, or urinal?: A Little Help from another person bathing (including washing, rinsing, drying)?: A Little Help from another person to put on and taking off regular upper body clothing?: A Little Help from another person to put on and taking off regular lower body clothing?: A Little 6 Click Score: 18    End of Session Equipment Utilized During Treatment: Gait belt;Rolling walker (2 wheels)  OT Visit Diagnosis: Other abnormalities of gait and mobility (R26.89);Pain Pain -  Right/Left: Right   Activity Tolerance Patient tolerated treatment well   Patient Left in chair;with call bell/phone within reach;with chair alarm set   Nurse Communication Mobility status        Time: 1610-9604 OT Time Calculation (min): 20 min  Charges: OT General Charges $OT Visit: 1 Visit OT Treatments $Self Care/Home Management : 8-22 mins  Carver Fila, OTD, OTR/L SecureChat Preferred Acute Rehab [phone]   Carver Fila Koonce 04/05/2023, 3:41 PM

## 2023-04-06 ENCOUNTER — Other Ambulatory Visit: Payer: Self-pay | Admitting: Radiation Therapy

## 2023-04-06 DIAGNOSIS — F411 Generalized anxiety disorder: Secondary | ICD-10-CM | POA: Diagnosis not present

## 2023-04-06 DIAGNOSIS — C7949 Secondary malignant neoplasm of other parts of nervous system: Secondary | ICD-10-CM | POA: Diagnosis not present

## 2023-04-06 DIAGNOSIS — C61 Malignant neoplasm of prostate: Secondary | ICD-10-CM | POA: Diagnosis not present

## 2023-04-06 DIAGNOSIS — M4854XD Collapsed vertebra, not elsewhere classified, thoracic region, subsequent encounter for fracture with routine healing: Secondary | ICD-10-CM | POA: Diagnosis not present

## 2023-04-06 MED ORDER — SIMETHICONE 40 MG/0.6ML PO SUSP: 80.0000 mg | Freq: Four times a day (QID) | ORAL | Status: DC | PRN

## 2023-04-06 NOTE — Progress Notes (Signed)
Progress Note    Nathan Berg   WUJ:[id number]  DOB: 13-Sep-1955  DOA: 03/28/2023     9 PCP: Nathan Mood, MD  Hospital Course:  Nathan Berg is a 67 yo male with past medical history of metastatic prostate cancer to the bone, generalized anxiety disorder initially presented to the hospital with worsening back pain. He had undergone outpatient workup with [facility] on 03/25/2023 with MRI T-spine which showed concern for new metastatic disease involving thoracic spine with concern for cord compression.  He was then referred to the ER. MRI thoracic spine repeated on admission and evaluated by surgery.  He was also started on steroids.  Assessment and Plan:  * Pathological compression fracture of thoracic vertebra  MRI of the T-spine showing metastasis involving T3-T4 vertebral body with pathological compression fracture with epidural extension of tumor resulting in severe spinal stenosis and cord compression at T3 levels.   Multiple additional smaller osseous metastases scattered elsewhere throughout the thoracic spine. Neurosurgery was consulted and patient underwent Decompressive laminectomy with partial facetectomy and evacuation of epidural tumor on 03/31/23.  On Decadron.  Pain medication regimen.  JP drain removed 04/03/2023.  Continue PT OT.  Recommendation is CIR at this time.  Plan for radiation oncology evaluation for radiation likely 04/07/2023.  Malignant neoplasm of prostate (HCC) With bony metastasis.  S/p robotic assisted laparoscopic radical prostatectomy and bilateral lymph node dissection in March 2015, pT2N0.  Patient follows up with [facility] oncology as outpatient. transitioning back to [place]  GAD (generalized anxiety disorder) - Continue Xanax and Lexapro  Subjective. Today, patient was seen and examined at bedside.  Complains of mild pain tingling on the back and intermittent pain on the front of the chest.  Feels slightly bloated.  Recently had few loose stools but solid bowel  movement yesterday.  Objective: Blood pressure 118/73, pulse 74, temperature 98.3 F (36.8 C), temperature source Oral, resp. rate 18, height 5\' 10"  (1.778 m), weight 82.2 kg, SpO2 98%.   Physical exam. Body mass index is 26 kg/m.   General:  Average built, not in obvious distress HENT:   No scleral pallor or icterus noted. Oral mucosa is moist.  Chest:  Clear breath sounds.   No crackles or wheezes.  Tenderness over the surgical site, honeycomb dressing.  No surrounding erythema or induration. CVS: S1 &S2 heard. No murmur.  Regular rate and rhythm. Abdomen: Soft, mildly distended but nontender.  Bowel sounds are heard.   Extremities: No cyanosis, clubbing or edema.  Peripheral pulses are palpable. Psych: Alert, awake and oriented, normal Berg CNS:  No cranial nerve deficits.  Power equal in all extremities.   Skin: Warm and dry.  No rashes noted.   Antimicrobials: None.  DVT prophylaxis:  SCD's Start: 03/31/23 1752  Code Status:   Code Status: Limited: Do not attempt resuscitation (DNR) -DNR-LIMITED -Do Not Intubate/DNI   Consultants:  Neurosurgery   Procedures:  03/31/2023: Decompressive laminectomy T3 extending into the inferior aspect of the 2 3 disc base to below the 3 4 disc base at the level of T4 pedicle. With partial facetectomy and evacuation of epidural tumor   Disposition Plan: CIR as per PT evaluation.  Status is: Inpt   Data Reviewed: No results found for this or any previous visit (from the past 24 hour(s)).     LOS: 9 days   Nathan Das, MD [facility] 04/06/2023, 9:37 AM

## 2023-04-06 NOTE — Progress Notes (Signed)
Physical Therapy Treatment  Patient Details Name: Nathan Berg MRN: [id number] DOB: 19-Oct-1955 Today's Date: 04/06/2023   History of Present Illness Pt is a 67 y/o male presenting to [facility] on 9/23 with low back pain, gait instability and falls; and transferred to [facility] due to concern of new metastatic disease to T3, T4 with associated spinal cord compression. S/P decompressive laminectomy T3 into T3 with partial facetectomy and evacuation of epidural tumor. PMH includes: arthritis, metastatic prostate cancer (with hx of chemo and radiation), hernia repair, prostatectomy.    PT Comments  Pt progressing towards physical therapy goals. Was able to perform transfers and ambulation with gross CGA to min assist however pt continues to be ataxic and with knee buckling throughout gait training. As pt fatigued, knee instability increased. Pt continues to be at a high risk for falls. Recommend intensive, multidisciplinary rehab at d/c to maximize functional return and safety. Will continue to follow.     If plan is discharge home, recommend the following: Assistance with cooking/housework;Assist for transportation;Help with stairs or ramp for entrance;A little help with walking and/or transfers;A little help with bathing/dressing/bathroom   Can travel by private vehicle        Equipment Recommendations  BSC/3in1;Wheelchair (measurements PT);Wheelchair cushion (measurements PT)    Recommendations for Other Services Rehab consult     Precautions / Restrictions Precautions Precautions: Fall;Back Precaution Booklet Issued: No Precaution Comments: no brace needed; reviewed precautions during functional mobility. Required Braces or Orthoses:  (no brace needed orders) Restrictions Weight Bearing Restrictions: No     Mobility  Bed Mobility Overal bed mobility: Needs Assistance Bed Mobility: Rolling, Sidelying to Sit, Sit to Sidelying Rolling: Modified independent (Device/Increase time) Sidelying to  sit: Supervision     Sit to sidelying: Supervision General bed mobility comments: VC's for optimal log roll technique.    Transfers Overall transfer level: Needs assistance Equipment used: Rolling walker (2 wheels) Transfers: Sit to/from Stand Sit to Stand: Contact guard assist           General transfer comment: VC's for hand placement on seated surface for safety and maintenance of back precautions.    Ambulation/Gait Ambulation/Gait assistance: Min assist Gait Distance (Feet): 60 Feet Assistive device: Rolling walker (2 wheels) Gait Pattern/deviations: Decreased step length - right, Decreased step length - left, Decreased dorsiflexion - right, Decreased stride length, Decreased dorsiflexion - left, Scissoring Gait velocity: decreased Gait velocity interpretation: <1.31 ft/sec, indicative of household ambulator   General Gait Details: B LE instability with minor buckling noted and intermittent scissoring gait. VC's throughout for improved posture, closer walker proximity and forward gaze. Gait specifically focused on strong heel strike on the R with terminal knee extension during swing through of L. As pt fatigued, increased incidences of knee buckling noted.   Stairs             Wheelchair Mobility     Tilt Bed    Modified Rankin (Stroke Patients Only)       Balance Overall balance assessment: Needs assistance Sitting-balance support: No upper extremity supported, Feet supported Sitting balance-Leahy Scale: Good     Standing balance support: During functional activity, Bilateral upper extremity supported, Reliant on assistive device for balance Standing balance-Leahy Scale: Poor                              Cognition Arousal: Alert Behavior During Therapy: WFL for tasks assessed/performed Overall Cognitive Status: Impaired/Different from baseline Area  of Impairment: Memory, Problem solving                     Memory: Decreased  recall of precautions       Problem Solving: Difficulty sequencing, Requires verbal cues          Exercises      General Comments        Pertinent Vitals/Pain Pain Assessment Pain Assessment: Faces Faces Pain Scale: Hurts a little bit Pain Location: incisonal Pain Descriptors / Indicators: Discomfort Pain Intervention(s): Limited activity within patient's tolerance, Monitored during session, Repositioned    Home Living                          Prior Function            PT Goals (current goals can now be found in the care plan section) Acute Rehab PT Goals Patient Stated Goal: Be able to go home. PT Goal Formulation: With patient Time For Goal Achievement: 04/15/23 Potential to Achieve Goals: Good Progress towards PT goals: Progressing toward goals    Frequency    Min 1X/week      PT Plan      Co-evaluation              AM-PAC PT "6 Clicks" Mobility   Outcome Measure  Help needed turning from your back to your side while in a flat bed without using bedrails?: A Little Help needed moving from lying on your back to sitting on the side of a flat bed without using bedrails?: A Little Help needed moving to and from a bed to a chair (including a wheelchair)?: A Little Help needed standing up from a chair using your arms (e.g., wheelchair or bedside chair)?: A Little Help needed to walk in hospital room?: A Little Help needed climbing 3-5 steps with a railing? : A Lot 6 Click Score: 17    End of Session Equipment Utilized During Treatment: Gait belt Activity Tolerance: Patient tolerated treatment well Patient left: in chair;with chair alarm set;with call bell/phone within reach Nurse Communication: Mobility status PT Visit Diagnosis: Unsteadiness on feet (R26.81);Other abnormalities of gait and mobility (R26.89);Muscle weakness (generalized) (M62.81);Difficulty in walking, not elsewhere classified (R26.2);Other symptoms and signs involving  the nervous system (R29.898)     Time: 1610-9604 PT Time Calculation (min) (ACUTE ONLY): 19 min  Charges:    $Gait Training: 8-22 mins PT General Charges $$ ACUTE PT VISIT: 1 Visit                     Conni Slipper, PT, DPT Acute Rehabilitation Services Secure Chat Preferred Office: [phone]    Marylynn Pearson 04/06/2023, 3:28 PM

## 2023-04-07 DIAGNOSIS — E538 Deficiency of other specified B group vitamins: Secondary | ICD-10-CM

## 2023-04-07 DIAGNOSIS — F411 Generalized anxiety disorder: Secondary | ICD-10-CM | POA: Diagnosis not present

## 2023-04-07 DIAGNOSIS — C7949 Secondary malignant neoplasm of other parts of nervous system: Secondary | ICD-10-CM | POA: Diagnosis not present

## 2023-04-07 DIAGNOSIS — M4854XD Collapsed vertebra, not elsewhere classified, thoracic region, subsequent encounter for fracture with routine healing: Secondary | ICD-10-CM | POA: Diagnosis not present

## 2023-04-07 DIAGNOSIS — C61 Malignant neoplasm of prostate: Secondary | ICD-10-CM | POA: Diagnosis not present

## 2023-04-07 LAB — CBC
HCT: 29.9 % — ABNORMAL LOW (ref 39.0–52.0)
Hemoglobin: 9.9 g/dL — ABNORMAL LOW (ref 13.0–17.0)
MCH: 33.1 pg (ref 26.0–34.0)
MCHC: 33.1 g/dL (ref 30.0–36.0)
MCV: 100 fL (ref 80.0–100.0)
Platelets: 299 10*3/uL (ref 150–400)
RBC: 2.99 MIL/uL — ABNORMAL LOW (ref 4.22–5.81)
RDW: 12.6 % (ref 11.5–15.5)
WBC: 6.3 10*3/uL (ref 4.0–10.5)
nRBC: 0 % (ref 0.0–0.2)

## 2023-04-07 LAB — BASIC METABOLIC PANEL
Anion gap: 8 (ref 5–15)
BUN: 24 mg/dL — ABNORMAL HIGH (ref 8–23)
CO2: 23 mmol/L (ref 22–32)
Calcium: 8.1 mg/dL — ABNORMAL LOW (ref 8.9–10.3)
Chloride: 101 mmol/L (ref 98–111)
Creatinine, Ser: 0.98 mg/dL (ref 0.61–1.24)
GFR, Estimated: >60 mL/min (ref >60)
Glucose, Bld: 132 mg/dL — ABNORMAL HIGH (ref 70–99)
Potassium: 3.3 mmol/L — ABNORMAL LOW (ref 3.5–5.1)
Sodium: 132 mmol/L — ABNORMAL LOW (ref 135–145)

## 2023-04-07 LAB — VITAMIN B12: Vitamin B-12: 243 pg/mL (ref 180–914)

## 2023-04-07 LAB — FOLATE: Folate: 8.3 ng/mL (ref >5.9)

## 2023-04-07 LAB — MAGNESIUM: Magnesium: 1.8 mg/dL (ref 1.7–2.4)

## 2023-04-07 MED ORDER — TRAZODONE HCL 50 MG PO TABS
25.0000 mg | ORAL_TABLET | Freq: Every day | ORAL | Status: DC
  Administered 2023-04-09 – 2023-04-12 (×4): 25 mg via ORAL
  Filled 2023-04-07 (×5): qty 1

## 2023-04-07 MED ORDER — TRAZODONE HCL 50 MG PO TABS: 25.0000 mg | ORAL_TABLET | Freq: Every day | ORAL | Status: DC

## 2023-04-07 MED ORDER — VITAMIN B-12 1000 MCG PO TABS
1000.0000 ug | ORAL_TABLET | Freq: Every day | ORAL | Status: DC
  Administered 2023-04-07 – 2023-04-13 (×7): 1000 ug via ORAL
  Filled 2023-04-07 (×7): qty 1

## 2023-04-07 MED ORDER — TRAZODONE HCL 50 MG PO TABS
25.0000 mg | ORAL_TABLET | Freq: Every evening | ORAL | Status: DC | PRN
  Administered 2023-04-08: 25 mg via ORAL

## 2023-04-07 MED ORDER — MAGNESIUM OXIDE -MG SUPPLEMENT 400 (240 MG) MG PO TABS
400.0000 mg | ORAL_TABLET | Freq: Two times a day (BID) | ORAL | Status: DC
  Administered 2023-04-07 – 2023-04-13 (×13): 400 mg via ORAL
  Filled 2023-04-07 (×13): qty 1

## 2023-04-07 MED ORDER — POTASSIUM CHLORIDE CRYS ER 20 MEQ PO TBCR
40.0000 meq | EXTENDED_RELEASE_TABLET | Freq: Once | ORAL | Status: AC
  Administered 2023-04-07: 40 meq via ORAL
  Filled 2023-04-07: qty 2

## 2023-04-07 MED ORDER — QUETIAPINE FUMARATE 100 MG PO TABS
200.0000 mg | ORAL_TABLET | Freq: Every day | ORAL | Status: DC
  Administered 2023-04-07 – 2023-04-12 (×6): 200 mg via ORAL
  Filled 2023-04-07 (×6): qty 2

## 2023-04-07 MED ORDER — ESCITALOPRAM OXALATE 10 MG PO TABS
30.0000 mg | ORAL_TABLET | Freq: Every day | ORAL | Status: DC
  Administered 2023-04-08 – 2023-04-13 (×6): 30 mg via ORAL
  Filled 2023-04-07 (×6): qty 3

## 2023-04-07 NOTE — TOC Progression Note (Signed)
Transition of Care [facility]) - Progression Note    Patient Details  Name: Nathan Berg MRN: [id number] Date of Birth: February 01, 1956  [facility] CM/SW Contact  Kermit Balo, RN Phone Number: 04/07/2023, 1:58 PM  Clinical Narrative:    CIR following for a rehab admission once the onc/ rad worked out.  TOC following.   Expected Discharge Plan: IP Rehab Facility Barriers to Discharge: Continued Medical Work up  Expected Discharge Plan and Services   Discharge Planning Services: CM Consult   Living arrangements for the past 2 months: Single Family Home                                       Social Determinants of Health (SDOH) Interventions SDOH Screenings   Food Insecurity: No Food Insecurity (03/29/2023)  Housing: Low Risk  (03/29/2023)  Transportation Needs: No Transportation Needs (03/29/2023)  Utilities: Not At Risk (03/29/2023)  Social Connections: Unknown (11/16/2021)   Received from [facility]  Tobacco Use: Low Risk  (03/28/2023)    Readmission Risk Interventions     No data to display

## 2023-04-07 NOTE — Progress Notes (Signed)
Subjective: Patient reports having increased back pain overnight   Objective: Vital signs in last 24 hours: Temp:  [97.9 F (36.6 C)-98.4 F (36.9 C)] 98.2 F (36.8 C) (10/03 0352) Pulse Rate:  [68-84] 68 (10/03 0352) Resp:  [18-20] 18 (10/03 0352) BP: (108-141)/(69-86) 141/80 (10/03 0352) SpO2:  [96 %-100 %] 98 % (10/03 0352) Weight:  [83.5 kg] 83.5 kg (10/03 0538)  Intake/Output from previous day: 10/02 0701 - 10/03 0700 In: 240 [P.O.:240] Out: 1529 [Urine:1529] Intake/Output this shift: No intake/output data recorded.  Neurologic: Grossly normal  Lab Results: Lab Results  Component Value Date   WBC 5.9 03/30/2023   HGB 11.9 (L) 03/30/2023   HCT 34.9 (L) 03/30/2023   MCV 97.8 03/30/2023   PLT 341 03/30/2023   Lab Results  Component Value Date   INR 1.0 10/22/2022   BMET Lab Results  Component Value Date   NA 136 03/30/2023   K 3.7 03/30/2023   CL 100 03/30/2023   CO2 27 03/30/2023   GLUCOSE 91 03/30/2023   BUN 17 03/30/2023   CREATININE 0.79 03/30/2023   CALCIUM 8.8 (L) 03/30/2023    Studies/Results: No results found.  Assessment/Plan: 1 week postop thoracic fusion for metastatic disease. Incision cdi, will work on pain control today and therapies.    LOS: 10 days    [facility] 04/07/2023, 7:42 AM

## 2023-04-07 NOTE — Progress Notes (Signed)
Myra Gianotti DO was informed that patient was a transfer and had orders Q4 VS with neuro checked inquired via chat if he would like to change to routine vs with neuro checks received verbal order via chat

## 2023-04-07 NOTE — Progress Notes (Signed)
Subjective: Patient reports patient doing well condition of back pain but lower extremity stable  Objective: Vital signs in last 24 hours: Temp:  [97.9 F (36.6 C)-98.4 F (36.9 C)] 98.3 F (36.8 C) (10/03 0807) Pulse Rate:  [68-84] 74 (10/03 0807) Resp:  [17-20] 17 (10/03 0807) BP: (108-141)/(69-86) 127/77 (10/03 0807) SpO2:  [96 %-100 %] 99 % (10/03 0807) Weight:  [83.5 kg] 83.5 kg (10/03 0538)  Intake/Output from previous day: 10/02 0701 - 10/03 0700 In: 240 [P.O.:240] Out: 1529 [Urine:1529] Intake/Output this shift: No intake/output data recorded.  Strength 5 out of 5 incision clean dry and intact  Lab Results: No results for input(s): "WBC", "HGB", "HCT", "PLT" in the last 72 hours. BMET No results for input(s): "NA", "K", "CL", "CO2", "GLUCOSE", "BUN", "CREATININE", "CALCIUM" in the last 72 hours.  Studies/Results: No results found.  Assessment/Plan: Postop day 7 decompressive thoracic laminectomy awaiting determination from Dr. Kathrynn Running with regard to stereotactic radiosurgery.  I think that there is a small chance that if patient's back pain does not improve it may be due to the fracture and some micromotion happening at that level and after radiation if he is no better we could consider stabilizing his back but I would rather not put the hardware in there before radiation as it would be easier to plan for stereotactic radiosurgical treatment without the hardware in there.  From my perspective fixation.  Patient can go to rehab at any point patient can shower just try to keep the Steri-Strips dry during showers  LOS: 10 days    Mariam Dollar 04/07/2023, 8:22 AM

## 2023-04-07 NOTE — Progress Notes (Signed)
Occupational Therapy Treatment Patient Details Name: Nathan Berg MRN: [id number] DOB: 08/08/1955 Today's Date: 04/07/2023   History of present illness Pt is a 67 y/o male presenting to [facility] on 9/23 with low back pain, gait instability and falls; and transferred to [facility] due to concern of new metastatic disease to T3, T4 with associated spinal cord compression. S/P decompressive laminectomy T3 into T3 with partial facetectomy and evacuation of epidural tumor. PMH includes: arthritis, metastatic prostate cancer (with hx of chemo and radiation), hernia repair, prostatectomy.   OT comments  Pt is making fair progress towards their acute OT goals. Overall he was CGA for transfers and functional ambulation with heavy reliance on the RW due to R knee buckling. In static standing pt also requires at least 1 UE supported for balance and safety due to BLE weakness. Pt declined LB ADLs, anticipate he will need at least min A for LB tasks due to poor dynamic standing balance. Educated pt on the 5 P's of energy conservation, pt was receptive and Adult nurse. OT to continue to follow acutely to facilitate progress towards established goals. Pt will continue to benefit from intensive inpatient follow up therapy, >3 hours/day after discharge.        If plan is discharge home, recommend the following:  A lot of help with walking and/or transfers;A little help with bathing/dressing/bathroom;Assistance with cooking/housework;Assist for transportation;Help with stairs or ramp for entrance   Equipment Recommendations  Other (comment)       Precautions / Restrictions Precautions Precautions: Fall;Back Precaution Booklet Issued: No Precaution Comments: no brace needed; reviewed precautions during functional mobility. Restrictions Weight Bearing Restrictions: No       Mobility Bed Mobility               General bed mobility comments: in recliner upon arrival, left in recliner at the end of the  session    Transfers Overall transfer level: Needs assistance Equipment used: Rolling walker (2 wheels) Transfers: Sit to/from Stand Sit to Stand: Contact guard assist           General transfer comment: good recall of safe hand placement     Balance Overall balance assessment: Needs assistance Sitting-balance support: No upper extremity supported, Feet supported Sitting balance-Leahy Scale: Good     Standing balance support: Single extremity supported, During functional activity Standing balance-Leahy Scale: Poor                             ADL either performed or assessed with clinical judgement   ADL Overall ADL's : Needs assistance/impaired     Grooming: Contact guard assist;Standing Grooming Details (indicate cue type and reason): needs at least 1 UE supported externally                 Toilet Transfer: Contact guard assist;Ambulation;Rolling walker (2 wheels) Toilet Transfer Details (indicate cue type and reason): educated to push RW over toilet if standing, safer to toilet in sitting at this time         Functional mobility during ADLs: Contact guard assist;Rolling walker (2 wheels) General ADL Comments: HEAVY reliance on RW due to RLE buckle    Extremity/Trunk Assessment Upper Extremity Assessment Upper Extremity Assessment: Overall WFL for tasks assessed   Lower Extremity Assessment Lower Extremity Assessment: Defer to PT evaluation        Vision   Vision Assessment?: No apparent visual deficits   Perception Perception Perception: Within Functional Limits  Praxis Praxis Praxis: WFL    Cognition Arousal: Alert Behavior During Therapy: WFL for tasks assessed/performed Overall Cognitive Status: Within Functional Limits for tasks assessed                                 General Comments: overall WFL for basic orientation, dual tasking and 2-3 steps direction following. pt verbalized feeling a little  anxious/uneasy about current medical situation        Exercises Other Exercises Other Exercises: educated on importance of 5 P's of energy conservation       General Comments VSS    Pertinent Vitals/ Pain       Pain Assessment Pain Assessment: Faces Faces Pain Scale: Hurts a little bit Pain Location: paraesthesias in BLE Pain Descriptors / Indicators: Pins and needles Pain Intervention(s): Limited activity within patient's tolerance, Monitored during session         Frequency  Min 1X/week        Progress Toward Goals  OT Goals(current goals can now be found in the care plan section)  Progress towards OT goals: Progressing toward goals  Acute Rehab OT Goals Patient Stated Goal: to get better OT Goal Formulation: With patient Time For Goal Achievement: 04/15/23 Potential to Achieve Goals: Good ADL Goals Pt Will Perform Grooming: with modified independence;standing Pt Will Perform Lower Body Dressing: with modified independence;sit to/from stand Pt Will Transfer to Toilet: with modified independence;ambulating Pt Will Perform Toileting - Clothing Manipulation and hygiene: with modified independence;sit to/from stand Pt Will Perform Tub/Shower Transfer: Shower transfer;with modified independence;ambulating;shower seat;rolling walker;grab bars         AM-PAC OT "6 Clicks" Daily Activity     Outcome Measure   Help from another person eating meals?: A Little Help from another person taking care of personal grooming?: A Little Help from another person toileting, which includes using toliet, bedpan, or urinal?: A Little Help from another person bathing (including washing, rinsing, drying)?: A Little Help from another person to put on and taking off regular upper body clothing?: A Little Help from another person to put on and taking off regular lower body clothing?: A Little 6 Click Score: 18    End of Session Equipment Utilized During Treatment: Gait belt;Rolling  walker (2 wheels)  OT Visit Diagnosis: Other abnormalities of gait and mobility (R26.89);Pain Pain - Right/Left: Right   Activity Tolerance Patient tolerated treatment well   Patient Left in chair;with call bell/phone within reach;with chair alarm set   Nurse Communication Mobility status        Time: 4166-0630 OT Time Calculation (min): 19 min  Charges: OT General Charges $OT Visit: 1 Visit OT Treatments $Therapeutic Activity: 8-22 mins  Derenda Mis, OTR/L Acute Rehabilitation Services Office [phone] Secure Chat Communication Preferred   Donia Pounds 04/07/2023, 2:04 PM

## 2023-04-07 NOTE — Progress Notes (Addendum)
Progress Note    Nathan Berg   YNW:[id number]  DOB: June 15, 1956  DOA: 03/28/2023     10 PCP: Malachy Mood, MD  Hospital Course:  [name] is a 67 yo male with past medical history of metastatic prostate cancer to the bone, generalized anxiety disorder initially presented to the hospital with worsening back pain. He had undergone outpatient workup with [facility] on 03/25/2023 with MRI T-spine which showed concern for new metastatic disease involving thoracic spine with concern for cord compression.  He was then referred to the ER. MRI thoracic spine repeated on admission and evaluated by surgery.  He was also started on steroids.  Assessment and Plan:  * Pathological compression fracture of thoracic vertebra  MRI of the T-spine showing metastasis involving T3-T4 vertebral body with pathological compression fracture with epidural extension of tumor resulting in severe spinal stenosis and cord compression at T3 levels.   Multiple additional smaller osseous metastases scattered elsewhere throughout the thoracic spine. Neurosurgery was consulted and patient underwent Decompressive laminectomy with partial facetectomy and evacuation of epidural tumor on 03/31/23.  On Decadron.  Continues to have back pain and will continue with pain medication regimen.  JP drain removed 04/03/2023.  Continue PT OT.  Recommendation is CIR at this time.  Plan for radiation oncology evaluation by Dr. Kathrynn Running in terms of stereotactic radiosurgery.  Malignant neoplasm of prostate (HCC) With bony metastasis.  S/p robotic assisted laparoscopic radical prostatectomy and bilateral lymph node dissection in March 2015, pT2N0.  Patient follows up with Dr. Mosetta Putt oncology as outpatient. transitioning back to [place]  GAD (generalized anxiety disorder) - Continue Xanax and Lexapro  Hypokalemia.  Will replace orally.  Check levels in a.m. latest magnesium of 1.8  Mild hyponatremia.  Will monitor.  Check levels in  AM.  Subjective. Today, patient was seen and examined at bedside.  Still complains of back pain.  Denies any nausea, vomiting, fever, chills or rigor.    Objective: Blood pressure 127/77, pulse 74, temperature 98.3 F (36.8 C), temperature source Oral, resp. rate 17, height 5\' 10"  (1.778 m), weight 83.5 kg, SpO2 99%.   Physical exam. Body mass index is 26.41 kg/m.   General:  Average built, not in obvious distress HENT:   Mild pallor noted.  Oral mucosa is moist.  Chest:  Clear breath sounds.   No crackles or wheezes.  Tenderness over the surgical site, clean dry and intact.  No surrounding erythema or induration. CVS: S1 &S2 heard. No murmur.  Regular rate and rhythm. Abdomen: Soft, mildly distended but nontender.  Bowel sounds are heard.   Extremities: No cyanosis, clubbing or edema.  Peripheral pulses are palpable. Psych: Alert, awake and oriented, normal mood CNS:  No cranial nerve deficits.  Power equal in all extremities.   Skin: Warm and dry.  No rashes noted.   Antimicrobials: None.  DVT prophylaxis:  SCD's Start: 03/31/23 1752  Code Status:   Code Status: Limited: Do not attempt resuscitation (DNR) -DNR-LIMITED -Do Not Intubate/DNI   Consultants:  Neurosurgery   Procedures:  03/31/2023: Decompressive laminectomy T3 extending into the inferior aspect of the 2 3 disc base to below the 3 4 disc base at the level of T4 pedicle with partial facetectomy and evacuation of epidural tumor   Disposition Plan: CIR as per PT evaluation.  Status is: Inpt   Data Reviewed: Results for orders placed or performed during the hospital encounter of 03/28/23 (from the past 24 hour(s))  Basic metabolic panel  Status: Abnormal   Collection Time: 04/07/23  8:05 AM  Result Value Ref Range   Sodium 132 (L) 135 - 145 mmol/L   Potassium 3.3 (L) 3.5 - 5.1 mmol/L   Chloride 101 98 - 111 mmol/L   CO2 23 22 - 32 mmol/L   Glucose, Bld 132 (H) 70 - 99 mg/dL   BUN 24 (H) 8 - 23 mg/dL    Creatinine, Ser 1.61 0.61 - 1.24 mg/dL   Calcium 8.1 (L) 8.9 - 10.3 mg/dL   GFR, Estimated >09 >60 mL/min   Anion gap 8 5 - 15  CBC     Status: Abnormal   Collection Time: 04/07/23  8:05 AM  Result Value Ref Range   WBC 6.3 4.0 - 10.5 K/uL   RBC 2.99 (L) 4.22 - 5.81 MIL/uL   Hemoglobin 9.9 (L) 13.0 - 17.0 g/dL   HCT 45.4 (L) 09.8 - 11.9 %   MCV 100.0 80.0 - 100.0 fL   MCH 33.1 26.0 - 34.0 pg   MCHC 33.1 30.0 - 36.0 g/dL   RDW 14.7 82.9 - 56.2 %   Platelets 299 150 - 400 K/uL   nRBC 0.0 0.0 - 0.2 %  Magnesium     Status: None   Collection Time: 04/07/23  8:05 AM  Result Value Ref Range   Magnesium 1.8 1.7 - 2.4 mg/dL       LOS: 10 days   Joycelyn Das, MD [facility] 04/07/2023, 11:04 AM

## 2023-04-07 NOTE — Progress Notes (Signed)
Physical Therapy Treatment Patient Details Name: Nathan Berg MRN: [id number] DOB: 1955/12/02 Today's Date: 04/07/2023   History of Present Illness Pt is a 67 y/o male presenting to [facility] on 9/23 with low back pain, gait instability and falls; and transferred to [facility] due to concern of new metastatic disease to T3, T4 with associated spinal cord compression. S/P decompressive laminectomy T3 into T3 with partial facetectomy and evacuation of epidural tumor. PMH includes: arthritis, metastatic prostate cancer (with hx of chemo and radiation), hernia repair, prostatectomy.    PT Comments  Tolerated treatment well, increasing ambulatory volume with second round of gait training. He still requires intermittent min assist for sporadic and spontaneous LE buckling but compensates well with RW for additional support. Cues for LE alignment and avoidance of scissoring, especially with turns. Reviewed LE exercises and to include a coordination/timing component with these. All questions answered. Pt in bed comfortably resting as he recently returned to bed just prior to PT visit. Patient will continue to benefit from skilled physical therapy services to further improve independence with functional mobility.     If plan is discharge home, recommend the following: Assistance with cooking/housework;Assist for transportation;Help with stairs or ramp for entrance;A little help with walking and/or transfers;A little help with bathing/dressing/bathroom   Can travel by private vehicle        Equipment Recommendations  BSC/3in1;Wheelchair (measurements PT);Wheelchair cushion (measurements PT)    Recommendations for Other Services Rehab consult     Precautions / Restrictions Precautions Precautions: Fall;Back Precaution Booklet Issued: No Precaution Comments: no brace needed; reviewed precautions during functional mobility. Required Braces or Orthoses:  (no brace needed orders) Restrictions Weight Bearing  Restrictions: No     Mobility  Bed Mobility Overal bed mobility: Needs Assistance Bed Mobility: Rolling, Sidelying to Sit, Sit to Sidelying Rolling: Modified independent (Device/Increase time) Sidelying to sit: Supervision     Sit to sidelying: Supervision General bed mobility comments: Supervision with transitions from sidelying to sit and back. Able to get LEs into bed with a little extra effort. Needed cues not to flex forward to reach for sock once seated at EOB.    Transfers Overall transfer level: Needs assistance Equipment used: Rolling walker (2 wheels) Transfers: Sit to/from Stand Sit to Stand: Contact guard assist           General transfer comment: CGA with transitions using RW for support upon standing, cues for hand placement and back precautions. Performed x2 from bed. Slow and effortful rise.    Ambulation/Gait Ambulation/Gait assistance: Min assist Gait Distance (Feet): 60 Feet (+45) Assistive device: Rolling walker (2 wheels) Gait Pattern/deviations: Decreased dorsiflexion - right, Decreased stride length, Decreased dorsiflexion - left, Scissoring, Step-through pattern, Narrow base of support, Knees buckling, Ataxic, Steppage Gait velocity: decreased Gait velocity interpretation: <1.31 ft/sec, indicative of household ambulator   General Gait Details: Intermittent buckling of knees with ability to stabilize using RW and min assist. Cues for appropriate alignment within RW to maximize UE support as needed. Intermittently scissoring, typically RLE over LLE. Cues for awareness and techniques to correct. Compensating reduced dorsiflexion with a little bit of a steppage gait and reaching with heel in an exaggerated manner but working well for improved clearance. Able to complete second shorter distance today after seated rest break.   Stairs             Wheelchair Mobility     Tilt Bed    Modified Rankin (Stroke Patients Only)  Balance Overall  balance assessment: Needs assistance Sitting-balance support: No upper extremity supported, Feet supported Sitting balance-Leahy Scale: Good     Standing balance support: Single extremity supported, During functional activity Standing balance-Leahy Scale: Poor Standing balance comment: Uses at least single UE to safely stabilize.                            Cognition Arousal: Alert Behavior During Therapy: WFL for tasks assessed/performed Overall Cognitive Status: Within Functional Limits for tasks assessed                                          Exercises General Exercises - Lower Extremity Ankle Circles/Pumps: AROM, Both, 10 reps, Supine, Seated Quad Sets: Strengthening, Both, 10 reps, Supine Gluteal Sets: Strengthening, Both, 10 reps, Supine Long Arc Quad: Strengthening, Both, 10 reps, Seated Heel Slides: Strengthening, Both, 5 reps, Supine Hip ABduction/ADduction: Strengthening, Both, 5 reps, Supine Straight Leg Raises: Strengthening, Both, 5 reps, Supine Toe Raises: Strengthening, Both, 10 reps, Seated Other Exercises Other Exercises: Above exercises with cues to focus on alignment and control with visual feedback for coordination improvement. Other Exercises: Performe bil hand held sit to stand transitions. VC for Rt hip abduction due to valgus collapse. Performed x5.    General Comments General comments (skin integrity, edema, etc.): VSS      Pertinent Vitals/Pain Pain Assessment Pain Assessment: Faces Faces Pain Scale: Hurts a little bit Pain Location: radiating into axillas Pain Descriptors / Indicators: Aching Pain Intervention(s): Monitored during session, Repositioned    Home Living                          Prior Function            PT Goals (current goals can now be found in the care plan section) Acute Rehab PT Goals Patient Stated Goal: Be able to go home. PT Goal Formulation: With patient Time For Goal  Achievement: 04/15/23 Potential to Achieve Goals: Good Progress towards PT goals: Progressing toward goals    Frequency    Min 1X/week      PT Plan      Co-evaluation              AM-PAC PT "6 Clicks" Mobility   Outcome Measure  Help needed turning from your back to your side while in a flat bed without using bedrails?: A Little Help needed moving from lying on your back to sitting on the side of a flat bed without using bedrails?: A Little Help needed moving to and from a bed to a chair (including a wheelchair)?: A Little Help needed standing up from a chair using your arms (e.g., wheelchair or bedside chair)?: A Little Help needed to walk in hospital room?: A Little Help needed climbing 3-5 steps with a railing? : A Lot 6 Click Score: 17    End of Session Equipment Utilized During Treatment: Gait belt Activity Tolerance: Patient tolerated treatment well Patient left: with call bell/phone within reach;in bed;with bed alarm set   PT Visit Diagnosis: Unsteadiness on feet (R26.81);Other abnormalities of gait and mobility (R26.89);Muscle weakness (generalized) (M62.81);Difficulty in walking, not elsewhere classified (R26.2);Other symptoms and signs involving the nervous system (R29.898)     Time: 2841-3244 PT Time Calculation (min) (ACUTE ONLY): 29 min  Charges:    $  Gait Training: 8-22 mins $Therapeutic Exercise: 8-22 mins PT General Charges $$ ACUTE PT VISIT: 1 Visit                     Kathlyn Sacramento, PT, DPT [facility]  Rehabilitation Services Physical Therapist Office: [phone] Website: [email or web address]    Berton Mount 04/07/2023, 4:02 PM

## 2023-04-07 NOTE — Progress Notes (Signed)
Nathan Berg   DOB:[birth year]   GM#:[id number]    ASSESSMENT & PLAN:  67 y.o.male with history mCRPC, bone metastases presented with difficulty walking, numbness in the leg after outside imaging showed metastatic disease involving thoracic spine with concern for cord compression.    9/26. Underwent Procedure: Decompressive laminectomy T3 extending into the inferior aspect of the 2 3 disc base to below the 3 4 disc base at the level of T4 pedicle. With partial facetectomy and evacuation of epidural tumor.    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  We still have chemotherapy as he has good functional status for example docetaxel, cabazitaxel, radiation therapy with radium 223, and Pluvicto. His alkaline phosphatase is not significantly elevated. Will repeat PSA  Recommend outpatient PSMA PET scan upon discharge. Order placed and currently scheduled for 10/14 Continue enzalutamide for now with ADT as outpatient   Upper back pain with band like sensation/known T3 metastasis S/p decompressive surgery Will consult radiation oncology to see if amenable for further radiation. Will follow up pathology as outpatient if discharged   B12 deficiency Macrocytosis Will start B12 1000 mcg daily  Thank you for the consult, if discharged, will follow-up as outpatient.  I will have our scheduler from medical oncology to contact the patient.  Please feel free to call if any questions. [phone]   Melven Sartorius, MD 04/07/2023 5:00 PM  Subjective:  Overall feeling about the same.  Pain over the mid upper spine area.  No lower extremity weakness or paresthesia.  No other symptoms.  Objective:  Vitals:   04/07/23 1126 04/07/23 1558  BP: 108/67 124/75  Pulse: 74 78  Resp: 17 16  Temp: 98.5 F (36.9 C) 98.9 F (37.2 C)  SpO2: 100% 99%     Intake/Output Summary (Last 24 hours) at 04/07/2023 1700 Last data filed at  04/07/2023 1400 Gross per 24 hour  Intake 240 ml  Output 1454 ml  Net -1214 ml    GENERAL: alert, no distress and comfortable Musculoskeletal: no lower extremity edema NEURO: alert with fluent speech, strength equal over the lower extremities Sensation equal bilaterally to light touch in lower extremities   Labs:  Recent Labs    02/15/23 1134 03/09/23 0913 03/28/23 1800 03/30/23 0724 04/07/23 0805  NA 138 141 136 136 132*  K 4.4 4.1 3.8 3.7 3.3*  CL 105 106 100 100 101  CO2 28 31 28 27 23   GLUCOSE 98 86 105* 91 132*  BUN 12 16 15 17  24*  CREATININE 0.87 0.87 0.83 0.79 0.98  CALCIUM 8.4* 9.2 8.9 8.8* 8.1*  GFRNONAA >60 >60 >60 >60 >60  PROT 6.3* 6.4* 7.2  --   --   ALBUMIN 3.7 3.8 4.0  --   --   AST 15 19 27   --   --   ALT 16 20 32  --   --   ALKPHOS 96 98 99  --   --   BILITOT 0.4 0.4 0.6  --   --     Studies:  DG Thoracic Spine 1 View  Result Date: 03/31/2023 CLINICAL DATA:  Thoracic laminectomy for tumor. EXAM: OPERATIVE THORACIC SPINE 1 VIEW(S) COMPARISON:  03/30/2023. FINDINGS: A single fluoroscopic image was obtained intraoperatively for thoracic laminectomy. Total fluoroscopy time is 2.9 seconds. Dose: 0.5 mGy. Please see operative report for additional information. IMPRESSION: Intraoperative utilization of fluoroscopy. Electronically Signed   By: Vernona Rieger  Ladona Ridgel M.D.   On: 03/31/2023 20:01   DG C-Arm 1-60 Min-No Report  Result Date: 03/31/2023 Fluoroscopy was utilized by the requesting physician.  No radiographic interpretation.   MR OUTSIDE FILMS SPINE  Result Date: 03/30/2023 This examination belongs to an outside facility and is stored here for comparison purposes only.  Contact the originating outside institution for any associated report or interpretation.  CT THORACIC SPINE WO CONTRAST  Result Date: 03/30/2023 CLINICAL DATA:  Thoracic compression fracture.  Known malignancy. EXAM: CT THORACIC SPINE WITHOUT CONTRAST TECHNIQUE: Multidetector CT images of  the thoracic were obtained using the standard protocol without intravenous contrast. RADIATION DOSE REDUCTION: This exam was performed according to the departmental dose-optimization program which includes automated exposure control, adjustment of the mA and/or kV according to patient size and/or use of iterative reconstruction technique. COMPARISON:  Thoracic spine MRI 1 day prior FINDINGS: Alignment: Normal. Vertebrae: Again seen is marked compression deformity of the T3 vertebral body with near-complete loss of vertebral body height and 4 mm bony retropulsion consistent with pathologic fracture in the setting of osseous metastatic disease. There is also a nondisplaced fracture through the right T3 lamina and transverse process extending to the inferior articulating process (5-23, 7-37). Also again seen is the more mild pathologic compression fracture of the T4 vertebral body with approximately 40% loss of vertebral body height anteriorly and no bony retropulsion. There is a nondisplaced fracture of the right T4 transverse process (5-31, 7-32). The other vertebral body heights are preserved. The bones are diffusely demineralized. Known additional metastatic lesions as well as epidural tumor at T3 and T4 are better seen on the thoracic spine MRI from 1 day prior. Paraspinal and other soft tissues: Nodular scarring in the medial right lung apex is unchanged. A probable sebaceous cyst is noted in the subcutaneous fat just to the left of midline at the T1 level. A large hiatal hernia is unchanged. Disc levels: There is overall mild underlying degenerative change throughout the thoracic spine. Known severe spinal canal stenosis at T3 is better seen on the MRI. IMPRESSION: 1. Pathologic compression fractures of the T3 and T4 vertebral bodies are similar to the prior MRI, worse at T3 where there is essentially complete loss of vertebral body height and bony retropulsion. Known severe spinal canal stenosis due to the  retropulsion and epidural tumor is better seen on the prior MRI. 2. Additional nondisplaced fractures of the right T3 lamina, transverse process, and inferior articulating process, and right T4 transverse process. Electronically Signed   By: Lesia Hausen M.D.   On: 03/30/2023 13:50   MR THORACIC SPINE W WO CONTRAST  Result Date: 03/30/2023 CLINICAL DATA:  Initial evaluation for musculoskeletal neoplasm, history of prostate cancer. EXAM: MRI THORACIC WITHOUT AND WITH CONTRAST TECHNIQUE: Multiplanar and multiecho pulse sequences of the thoracic spine were obtained without and with intravenous contrast. CONTRAST:  9mL GADAVIST GADOBUTROL 1 MMOL/ML IV SOLN COMPARISON:  Prior study from 12/09/2022. FINDINGS: Alignment: Vertebral bodies normally aligned with preservation of the normal thoracic kyphosis. Trace degenerative retrolisthesis of L1 on L2 noted. Vertebrae: Osseous metastasis involving the T3 vertebral body is seen. Associated pathologic compression fracture with vertebral plana formation and up to 6 mm of bony retropulsion. Additional osseous metastasis involves the entirety of the T4 vertebral body. Associated 40% height loss without significant bony retropulsion. Tumor extends to involve the posterior elements bilaterally. Epidural extension of tumor at these levels with tumor within the ventral epidural space, most pronounced at T3 where there  is resultant severe spinal stenosis and cord compression (series 10, image 17). Secondary cord flattening with suspected early/mild cord signal changes (series 4, image 13). Thecal sac measures approximately 4-5 mm in AP diameter. No more than mild stenosis inferiorly at the level of T4. Moderate to severe bilateral foraminal narrowing noted at T2-3 and T3-4 as well. Multiple additional smaller osseous metastases seen scattered elsewhere throughout the thoracic spine. For reference purposes, the largest additional lesion involves the inferior aspect of T2 and  measures 1.5 cm (series 5, image 12). No other significant extra osseous or epidural extension of tumor. No other pathologic compression fracture. Few additional scattered osseous metastases noted within the cervical spine on counter sequence. No visible extra osseous or epidural extension of tumor within the cervical spine itself. Cord: Cord impingement with suspected subtle early cord signal changes at the level of T3 as above. Otherwise normal signal and morphology. Paraspinal and other soft tissues: Paraspinous soft tissues demonstrate no acute finding. Small layering left pleural effusion noted. Disc levels: Underlying mild for age multilevel thoracic spondylosis elsewhere throughout the thoracic spine. Scattered superimposed multilevel facet disease. No other significant spinal stenosis. Mild multilevel foraminal narrowing noted within the lower thoracic spine at T10-11 bilaterally and T12-L1 on the left. IMPRESSION: 1. Osseous metastases involving the T3 and T4 vertebral bodies with associated pathologic compression fractures as above. Epidural extension of tumor at these levels with resultant severe spinal stenosis and cord compression at the level of T3. Probable subtle early/mild cord signal changes at this level. 2. Multiple additional smaller osseous metastases scattered elsewhere throughout the thoracic spine. No other significant extra osseous or epidural extension of tumor. 3. Underlying mild for age thoracic spondylosis without other high-grade spinal stenosis. Mild multilevel foraminal narrowing within the lower thoracic spine as above. 4. Small layering left pleural effusion. Electronically Signed   By: Rise Mu M.D.   On: 03/30/2023 04:25   DG Chest 2 View  Result Date: 03/09/2023 CLINICAL DATA:  Chest pain.  History of prostate cancer. EXAM: CHEST - 2 VIEW COMPARISON:  October 22, 2022.  Nov 14, 2022. FINDINGS: The heart size and mediastinal contours are within normal limits. Both  lungs are clear. Elevated right hemidiaphragm is noted. Severe compression deformity of upper thoracic vertebral body is noted consistent with metastatic disease as noted on prior PET scan. Hiatal hernia is noted. IMPRESSION: No acute cardiopulmonary abnormality is seen. Hiatal hernia. Compression deformity of upper thoracic vertebral body as noted above. Electronically Signed   By: Lupita Raider M.D.   On: 03/09/2023 13:59

## 2023-04-08 DIAGNOSIS — E538 Deficiency of other specified B group vitamins: Secondary | ICD-10-CM

## 2023-04-08 DIAGNOSIS — C61 Malignant neoplasm of prostate: Secondary | ICD-10-CM | POA: Diagnosis not present

## 2023-04-08 DIAGNOSIS — F411 Generalized anxiety disorder: Secondary | ICD-10-CM | POA: Diagnosis not present

## 2023-04-08 DIAGNOSIS — M4854XD Collapsed vertebra, not elsewhere classified, thoracic region, subsequent encounter for fracture with routine healing: Secondary | ICD-10-CM | POA: Diagnosis not present

## 2023-04-08 LAB — GLUCOSE, CAPILLARY
Glucose-Capillary: 73 mg/dL (ref 70–99)
Glucose-Capillary: 153 mg/dL — ABNORMAL HIGH (ref 70–99)

## 2023-04-08 LAB — CBC
HCT: 27.2 % — ABNORMAL LOW (ref 39.0–52.0)
Hemoglobin: 9 g/dL — ABNORMAL LOW (ref 13.0–17.0)
MCH: 32.7 pg (ref 26.0–34.0)
MCHC: 33.1 g/dL (ref 30.0–36.0)
MCV: 98.9 fL (ref 80.0–100.0)
Platelets: 296 10*3/uL (ref 150–400)
RBC: 2.75 MIL/uL — ABNORMAL LOW (ref 4.22–5.81)
RDW: 12.6 % (ref 11.5–15.5)
WBC: 4.2 10*3/uL (ref 4.0–10.5)
nRBC: 0 % (ref 0.0–0.2)

## 2023-04-08 LAB — BASIC METABOLIC PANEL
Anion gap: 7 (ref 5–15)
BUN: 24 mg/dL — ABNORMAL HIGH (ref 8–23)
CO2: 26 mmol/L (ref 22–32)
Calcium: 8.3 mg/dL — ABNORMAL LOW (ref 8.9–10.3)
Chloride: 102 mmol/L (ref 98–111)
Creatinine, Ser: 0.81 mg/dL (ref 0.61–1.24)
GFR, Estimated: >60 mL/min (ref >60)
Glucose, Bld: 95 mg/dL (ref 70–99)
Potassium: 4.1 mmol/L (ref 3.5–5.1)
Sodium: 135 mmol/L (ref 135–145)

## 2023-04-08 LAB — MAGNESIUM: Magnesium: 1.9 mg/dL (ref 1.7–2.4)

## 2023-04-08 MED ORDER — HYDROMORPHONE HCL 1 MG/ML IJ SOLN
1.0000 mg | INTRAMUSCULAR | Status: DC | PRN
  Administered 2023-04-08: 1 mg via SUBCUTANEOUS
  Filled 2023-04-08: qty 1

## 2023-04-08 MED ORDER — DOCUSATE SODIUM 100 MG PO CAPS
100.0000 mg | ORAL_CAPSULE | Freq: Two times a day (BID) | ORAL | Status: DC
  Administered 2023-04-08 – 2023-04-13 (×11): 100 mg via ORAL
  Filled 2023-04-08 (×11): qty 1

## 2023-04-08 MED ORDER — PANTOPRAZOLE SODIUM 40 MG PO TBEC
40.0000 mg | DELAYED_RELEASE_TABLET | Freq: Two times a day (BID) | ORAL | Status: DC
  Administered 2023-04-08 – 2023-04-13 (×11): 40 mg via ORAL
  Filled 2023-04-08 (×11): qty 1

## 2023-04-08 MED ORDER — OXYCODONE HCL 5 MG PO TABS
10.0000 mg | ORAL_TABLET | ORAL | Status: DC | PRN
  Administered 2023-04-09 – 2023-04-13 (×11): 10 mg via ORAL
  Filled 2023-04-08 (×14): qty 2

## 2023-04-08 MED ORDER — HYDROMORPHONE HCL 1 MG/ML IJ SOLN
1.0000 mg | INTRAMUSCULAR | Status: DC | PRN
  Administered 2023-04-08 – 2023-04-13 (×17): 1 mg via INTRAVENOUS
  Filled 2023-04-08 (×17): qty 1

## 2023-04-08 MED ORDER — IBUPROFEN 200 MG PO TABS
400.0000 mg | ORAL_TABLET | Freq: Four times a day (QID) | ORAL | Status: DC | PRN
  Administered 2023-04-08 – 2023-04-13 (×9): 400 mg via ORAL
  Filled 2023-04-08 (×10): qty 2

## 2023-04-08 NOTE — Plan of Care (Signed)
  Problem: Education: Goal: Knowledge of General Education information will improve Description: Including pain rating scale, medication(s)/side effects and non-pharmacologic comfort measures Outcome: Progressing   Problem: Health Behavior/Discharge Planning: Goal: Ability to manage health-related needs will improve Outcome: Progressing   Problem: Clinical Measurements: Goal: Ability to maintain clinical measurements within normal limits will improve Outcome: Progressing Goal: Will remain free from infection Outcome: Progressing Goal: Diagnostic test results will improve Outcome: Progressing Goal: Respiratory complications will improve Outcome: Progressing Goal: Cardiovascular complication will be avoided Outcome: Progressing   Problem: Activity: Goal: Risk for activity intolerance will decrease Outcome: Progressing   Problem: Nutrition: Goal: Adequate nutrition will be maintained Outcome: Progressing   Problem: Coping: Goal: Level of anxiety will decrease Outcome: Progressing   Problem: Elimination: Goal: Will not experience complications related to bowel motility Outcome: Progressing Goal: Will not experience complications related to urinary retention Outcome: Progressing   Problem: Pain Managment: Goal: General experience of comfort will improve Outcome: Progressing   Problem: Skin Integrity: Goal: Risk for impaired skin integrity will decrease Outcome: Progressing   Problem: Education: Goal: Ability to verbalize activity precautions or restrictions will improve Outcome: Progressing Goal: Knowledge of the prescribed therapeutic regimen will improve Outcome: Progressing

## 2023-04-08 NOTE — Progress Notes (Signed)
Physical Therapy Treatment Patient Details Name: Nathan Berg MRN: [id number] DOB: [birth year] Today's Date: 04/08/2023   History of Present Illness 67 yo male adm to [facility] on 9/23 with LBP, gait instability and falls; and transferred to [facility]. Pt with T3/4 metastatic disease with associated spinal cord compression. 9/26 S/P decompressive laminectomy T3 and evacuation of epidural tumor. PMHx: arthritis, metastatic prostate CA, hernia repair, prostatectomy.    PT Comments  Pt pleasant and reports fatigue with increased gait distance. Pt with improving endurance and control of stepping with gait. Pt with increased deficits during gait with fatigue with cues to self-regulate distance. Pt performed repeated transfers and mobility with pt eager to regain function for eventual return home.  Will continue to follow.      If plan is discharge home, recommend the following: Assistance with cooking/housework;Assist for transportation;Help with stairs or ramp for entrance;A little help with walking and/or transfers;A little help with bathing/dressing/bathroom   Can travel by private vehicle        Equipment Recommendations  BSC/3in1;Wheelchair (measurements PT);Wheelchair cushion (measurements PT);Rolling walker (2 wheels)    Recommendations for Other Services       Precautions / Restrictions Precautions Precautions: Fall;Back     Mobility  Bed Mobility   Bed Mobility: Sit to Supine       Sit to supine: Modified independent (Device/Increase time)   General bed mobility comments: pt able to transition to sidelying then supine without physical assist    Transfers Overall transfer level: Needs assistance   Transfers: Sit to/from Stand Sit to Stand: Contact guard assist           General transfer comment: CGA from recliner, toilet and bed. Pt completed 5 repeated STS at bed with cues for controlled descent. After gait pt with decreased control and sitting prematurely due to fatigue with  cues for safety and sequence    Ambulation/Gait Ambulation/Gait assistance: Min assist Gait Distance (Feet): 130 Feet Assistive device: Rolling walker (2 wheels) Gait Pattern/deviations: Decreased dorsiflexion - right, Decreased stride length, Scissoring, Step-through pattern, Narrow base of support, Ataxic   Gait velocity interpretation: <1.8 ft/sec, indicate of risk for recurrent falls   General Gait Details: pt initially with smooth, controlled, slow gait. With fatigue pt with adduction of RLE and at times RLE "sticking" and needing cues to advance. cues for posture, step width and control. Pt with standing rest during gait to recover. repeated trial of 40' after seated rest with improved control and direction of RW with turning   Stairs             Wheelchair Mobility     Tilt Bed    Modified Rankin (Stroke Patients Only)       Balance Overall balance assessment: Needs assistance Sitting-balance support: No upper extremity supported, Feet supported Sitting balance-Leahy Scale: Good     Standing balance support: Single extremity supported, During functional activity Standing balance-Leahy Scale: Poor                              Cognition Arousal: Alert Behavior During Therapy: WFL for tasks assessed/performed Overall Cognitive Status: Impaired/Different from baseline Area of Impairment: Safety/judgement                         Safety/Judgement: Decreased awareness of deficits     General Comments: needs cues to recognize errors with gait and safety  Exercises      General Comments        Pertinent Vitals/Pain Pain Assessment Pain Score: 6  Pain Location: sternum and axilla Pain Descriptors / Indicators: Aching Pain Intervention(s): Limited activity within patient's tolerance, Repositioned, Monitored during session, Premedicated before session    Home Living                          Prior Function             PT Goals (current goals can now be found in the care plan section) Progress towards PT goals: Progressing toward goals    Frequency    Min 1X/week      PT Plan      Co-evaluation              AM-PAC PT "6 Clicks" Mobility   Outcome Measure  Help needed turning from your back to your side while in a flat bed without using bedrails?: A Little Help needed moving from lying on your back to sitting on the side of a flat bed without using bedrails?: A Little Help needed moving to and from a bed to a chair (including a wheelchair)?: A Little Help needed standing up from a chair using your arms (e.g., wheelchair or bedside chair)?: A Little Help needed to walk in hospital room?: A Little Help needed climbing 3-5 steps with a railing? : A Lot 6 Click Score: 17    End of Session   Activity Tolerance: Patient tolerated treatment well Patient left: in bed;with call bell/phone within reach Nurse Communication: Mobility status PT Visit Diagnosis: Unsteadiness on feet (R26.81);Other abnormalities of gait and mobility (R26.89);Other symptoms and signs involving the nervous system (R29.898)     Time: 1610-9604 PT Time Calculation (min) (ACUTE ONLY): 30 min  Charges:    $Gait Training: 8-22 mins $Therapeutic Activity: 8-22 mins PT General Charges $$ ACUTE PT VISIT: 1 Visit                     Merryl Hacker, PT Acute Rehabilitation Services Office: [phone]    Sharian Delia B Kyla Duffy 04/08/2023, 12:00 PM

## 2023-04-08 NOTE — Progress Notes (Signed)
Progress Note    Nathan Berg   JYN:[id number]  DOB: 02-16-1956  DOA: 03/28/2023     11 PCP: Malachy Mood, MD  Hospital Course:  Mr. Nathan Berg is a 67 yo male with past medical history of metastatic prostate cancer to the bone, generalized anxiety disorder initially presented to the hospital with worsening back pain. He had undergone outpatient workup with [facility] on 03/25/2023 with MRI T-spine which showed concern for new metastatic disease involving thoracic spine with concern for cord compression.  He was then referred to the ER. MRI thoracic spine repeated on admission and evaluated by surgery.  He was also started on steroids.  Assessment and Plan:  * Pathological compression fracture of thoracic vertebra  MRI of the T-spine showing metastasis involving T3-T4 vertebral body with pathological compression fracture with epidural extension of tumor resulting in severe spinal stenosis and cord compression at T3 levels.   Multiple additional smaller osseous metastases scattered elsewhere throughout the thoracic spine. Neurosurgery was consulted and patient underwent Decompressive laminectomy with partial facetectomy and evacuation of epidural tumor on 03/31/23.  Initially received Decadron.  Continues to have back pain.  Will decrease Dilaudid frequency, increase dose of oxycodone to 10 mg every 4 hours.  Patient states that he responds well with Advil.  Will add low-dose for now.  Continue Flexeril.  On fentanyl patch.  Plan is to cut down on IV narcotics as much as possible.  JP drain removed 04/03/2023.  Continue PT OT.  Recommendation is CIR at this time.  Communicated with Dr. Kathrynn Running radiation oncology who plans to see the patient as outpatient after the wound heals likely 14 days for incisions to heal before any radiation. He is scheduled to be presented in  spine oncology conference at 7 am Monday 10/7 for discussion of his imaging and pathology. Following that meeting, oncology will put in a plan  for him.  Malignant neoplasm of prostate (HCC) With bony metastasis.  S/p robotic assisted laparoscopic radical prostatectomy and bilateral lymph node dissection in March 2015, pT2N0.  Patient follows up with Dr. Mosetta Putt oncology as outpatient. transitioning back to [place]  GAD (generalized anxiety disorder) - Continue Xanax and Lexapro  Hypokalemia.  Improved after replacement.  Latest potassium of of 4.1.    Mild hyponatremia.  Improved.  Latest sodium of 135.  Subjective. Today, patient was seen and examined at bedside.  Still complains of back pain.  Is been requiring IV narcotics.  Spoke about transitioning to oral options.  Denies any nausea, vomiting, fever, chills or rigor.    Objective: Blood pressure 124/79, pulse 70, temperature 98.2 F (36.8 C), resp. rate 18, height 5\' 10"  (1.778 m), weight 87 kg, SpO2 99%.   Physical exam. Body mass index is 27.52 kg/m.   General:  Average built, not in obvious distress HENT:   Mild pallor noted.  Oral mucosa is moist.  Chest:  Clear breath sounds.   No crackles or wheezes.  Tenderness over the surgical site, clean dry and intact.  No surrounding erythema or induration. CVS: S1 &S2 heard. No murmur.  Regular rate and rhythm. Abdomen: Soft, mildly distended but nontender.  Bowel sounds are heard.   Extremities: No cyanosis, clubbing or edema.  Peripheral pulses are palpable. Psych: Alert, awake and oriented, normal mood CNS:  No cranial nerve deficits.  Power equal in all extremities.   Skin: Warm and dry.  No rashes noted.   Antimicrobials: None.  DVT prophylaxis:  SCD's Start: 03/31/23 1752  Code Status:   Code Status: Limited: Do not attempt resuscitation (DNR) -DNR-LIMITED -Do Not Intubate/DNI   Consultants:  Neurosurgery   Procedures:  03/31/2023: Decompressive laminectomy T3 extending into the inferior aspect of the 2 3 disc base to below the 3 4 disc base at the level of T4 pedicle with partial facetectomy and evacuation  of epidural tumor   Disposition Plan: CIR as per PT evaluation.  Pending adequate pain control.  Status is: Inpt   Data Reviewed: Results for orders placed or performed during the hospital encounter of 03/28/23 (from the past 24 hour(s))  Vitamin B12     Status: None   Collection Time: 04/07/23  2:32 PM  Result Value Ref Range   Vitamin B-12 243 180 - 914 pg/mL  Basic metabolic panel     Status: Abnormal   Collection Time: 04/08/23  5:47 AM  Result Value Ref Range   Sodium 135 135 - 145 mmol/L   Potassium 4.1 3.5 - 5.1 mmol/L   Chloride 102 98 - 111 mmol/L   CO2 26 22 - 32 mmol/L   Glucose, Bld 95 70 - 99 mg/dL   BUN 24 (H) 8 - 23 mg/dL   Creatinine, Ser 4.09 0.61 - 1.24 mg/dL   Calcium 8.3 (L) 8.9 - 10.3 mg/dL   GFR, Estimated >81 >19 mL/min   Anion gap 7 5 - 15  CBC     Status: Abnormal   Collection Time: 04/08/23  5:47 AM  Result Value Ref Range   WBC 4.2 4.0 - 10.5 K/uL   RBC 2.75 (L) 4.22 - 5.81 MIL/uL   Hemoglobin 9.0 (L) 13.0 - 17.0 g/dL   HCT 14.7 (L) 82.9 - 56.2 %   MCV 98.9 80.0 - 100.0 fL   MCH 32.7 26.0 - 34.0 pg   MCHC 33.1 30.0 - 36.0 g/dL   RDW 13.0 86.5 - 78.4 %   Platelets 296 150 - 400 K/uL   nRBC 0.0 0.0 - 0.2 %  Magnesium     Status: None   Collection Time: 04/08/23  5:47 AM  Result Value Ref Range   Magnesium 1.9 1.7 - 2.4 mg/dL  Glucose, capillary     Status: None   Collection Time: 04/08/23  8:24 AM  Result Value Ref Range   Glucose-Capillary 73 70 - 99 mg/dL   Comment 1 Notify RN    Comment 2 Document in Chart        LOS: 11 days   Joycelyn Das, MD [facility] 04/08/2023, 10:52 AM

## 2023-04-09 DIAGNOSIS — F411 Generalized anxiety disorder: Secondary | ICD-10-CM | POA: Diagnosis not present

## 2023-04-09 DIAGNOSIS — M4854XD Collapsed vertebra, not elsewhere classified, thoracic region, subsequent encounter for fracture with routine healing: Secondary | ICD-10-CM | POA: Diagnosis not present

## 2023-04-09 DIAGNOSIS — E538 Deficiency of other specified B group vitamins: Secondary | ICD-10-CM | POA: Diagnosis not present

## 2023-04-09 DIAGNOSIS — C61 Malignant neoplasm of prostate: Secondary | ICD-10-CM | POA: Diagnosis not present

## 2023-04-09 NOTE — Plan of Care (Signed)

## 2023-04-09 NOTE — Progress Notes (Signed)
Occupational Therapy Treatment Patient Details Name: Nathan Berg MRN: [id number] DOB: [birth year] Today's Date: 04/09/2023   History of present illness 67 yo male adm to [facility] on 9/23 with LBP, gait instability and falls; and transferred to [facility]. Pt with T3/4 metastatic disease with associated spinal cord compression. 9/26 S/P decompressive laminectomy T3 and evacuation of epidural tumor. PMHx: arthritis, metastatic prostate CA, hernia repair, prostatectomy.   OT comments  Patient supine in bed upon arrival into room and agreeable to participate in OT intervention.  Patient completed supine to sit with Mod I.  Patient then completed sit to stand  with CGA and completed functional mobility to sink with RW and CGA.  Patient completed UB grooming and hygiene at sink with 1 UE on sink surface for standing balance and support and completed ADL tasks. Patient returned to bedside chair with RW and CGA and left with all needs within reach.  Patient is making progress toward OT goals and would benefit from additional OT intervention to address remaining deficits in order for patient to return home to PLOF.      If plan is discharge home, recommend the following:  A lot of help with walking and/or transfers;A little help with bathing/dressing/bathroom;Assistance with cooking/housework;Assist for transportation;Help with stairs or ramp for entrance   Equipment Recommendations  Other (comment)    Recommendations for Other Services Rehab consult    Precautions / Restrictions Precautions Precautions: Fall;Back Precaution Booklet Issued: No Precaution Comments: no brace needed; reviewed precautions during functional mobility. Restrictions Weight Bearing Restrictions: No       Mobility Bed Mobility Overal bed mobility: Needs Assistance Bed Mobility: Sit to Supine Rolling: Modified independent (Device/Increase time)              Transfers Overall transfer level: Needs assistance Equipment  used: Rolling walker (2 wheels) Transfers: Sit to/from Stand Sit to Stand: Contact guard assist                 Balance Overall balance assessment: Needs assistance Sitting-balance support: No upper extremity supported, Feet supported Sitting balance-Leahy Scale: Good     Standing balance support: Single extremity supported, During functional activity   Standing balance comment: Uses at least single UE to safely stabilize.                           ADL either performed or assessed with clinical judgement   ADL Overall ADL's : Needs assistance/impaired Eating/Feeding: Independent;Sitting   Grooming: Wash/dry hands;Wash/dry face;Oral care;Sitting;Contact guard assist Grooming Details (indicate cue type and reason):  (1 UE supported on sink for balance)                             Functional mobility during ADLs: Contact guard assist;Rolling walker (2 wheels)      Extremity/Trunk Assessment Upper Extremity Assessment Upper Extremity Assessment: Generalized weakness            Vision       Perception     Praxis      Cognition Arousal: Alert Behavior During Therapy: [facility] for tasks assessed/performed                                            Exercises      Shoulder Instructions  General Comments      Pertinent Vitals/ Pain       Pain Assessment Pain Assessment: 0-10 Pain Score: 6  Pain Location: back Pain Descriptors / Indicators: Aching Pain Intervention(s): Monitored during session  Home Living                                          Prior Functioning/Environment              Frequency  Min 1X/week        Progress Toward Goals  OT Goals(current goals can now be found in the care plan section)  Progress towards OT goals: Progressing toward goals  Acute Rehab OT Goals OT Goal Formulation: With patient Time For Goal Achievement: 04/15/23 Potential to Achieve Goals:  Good ADL Goals Pt Will Perform Grooming: with modified independence;standing Pt Will Perform Lower Body Dressing: with modified independence;sit to/from stand Pt Will Transfer to Toilet: with modified independence;ambulating Pt Will Perform Toileting - Clothing Manipulation and hygiene: with modified independence;sit to/from stand Pt Will Perform Tub/Shower Transfer: Shower transfer;with modified independence;ambulating;shower seat;rolling walker;grab bars  Plan      Co-evaluation                 AM-PAC OT "6 Clicks" Daily Activity     Outcome Measure   Help from another person eating meals?: A Little Help from another person taking care of personal grooming?: A Little Help from another person toileting, which includes using toliet, bedpan, or urinal?: A Little Help from another person bathing (including washing, rinsing, drying)?: A Little Help from another person to put on and taking off regular upper body clothing?: A Little Help from another person to put on and taking off regular lower body clothing?: A Little 6 Click Score: 18    End of Session Equipment Utilized During Treatment: Gait belt;Rolling walker (2 wheels)  OT Visit Diagnosis: Other abnormalities of gait and mobility (R26.89);Pain   Activity Tolerance Patient tolerated treatment well   Patient Left in chair;with call bell/phone within reach;with chair alarm set   Nurse Communication Mobility status        Time: 1610-9604 OT Time Calculation (min): 18 min  Charges: OT General Charges $OT Visit: 1 Visit OT Treatments $Self Care/Home Management : 8-22 mins  Governor Specking OT/L  Denice Paradise 04/09/2023, 10:56 AM

## 2023-04-09 NOTE — Progress Notes (Signed)
Progress Note    Nathan Berg   WGN:[id number]  DOB: October 10, 1955  DOA: 03/28/2023     12 PCP: Malachy Mood, MD  Hospital Course:  Nathan Berg is a 67 yo male with past medical history of metastatic prostate cancer to the bone, generalized anxiety disorder initially presented to the hospital with worsening back pain. He had undergone outpatient workup with [facility] on 03/25/2023 with MRI T-spine which showed concern for new metastatic disease involving thoracic spine with concern for cord compression.  He was then referred to the ER. MRI thoracic spine repeated on admission and evaluated by surgery.  He was also started on steroids.  Patient was then admitted to hospital for further evaluation and treatment.  Patient underwent decompressive laminectomy during hospitalization and currently awaiting for rehabilitation placement.  Assessment and Plan:  * Pathological compression fracture of thoracic vertebra  MRI of the T-spine showing metastasis involving T3-T4 vertebral body with pathological compression fracture with epidural extension of tumor resulting in severe spinal stenosis and cord compression at T3 levels.   Multiple additional smaller osseous metastases scattered elsewhere throughout the thoracic spine. Neurosurgery was consulted and patient underwent Decompressive laminectomy with partial facetectomy and evacuation of epidural tumor on 03/31/23.  Initially received Decadron.  Continues to have back pain.   Dilaudid frequency has been decreased and oxycodone dose has been bumped up including addition of Advil from yesterday. Continue Flexeril.  On fentanyl patch.  Plan is to cut down on IV narcotics transition to oral for adequate pain control..  JP drain removed 04/03/2023.  Continue PT OT.  Recommendation is CIR at this time.  Communicated with Dr. Kathrynn Running radiation oncology who plans to see the patient as outpatient after the wound heals likely 14 days for incisions to heal before any  radiation. He is scheduled to be presented in  spine oncology conference at 7 am Monday 10/7 for discussion of his imaging and pathology. Following that meeting, oncology will put in a plan for him.  Malignant neoplasm of prostate (HCC) With bony metastasis.  S/p robotic assisted laparoscopic radical prostatectomy and bilateral lymph node dissection in March 2015, pT2N0.  Patient follows up with Dr. Mosetta Putt oncology as outpatient. transitioning back to [place]  GAD (generalized anxiety disorder) - Continue Xanax and Lexapro  Hypokalemia.  Improved after replacement.  Latest potassium of of 4.1.    Mild hyponatremia.  Improved.  Latest sodium of 135.  Subjective. Today, patient was seen and examined at bedside.  Feels upset that his medications were changed yesterday.  Explained to him the transition and availability of medications.  Spoke with nursing staff at bedside.    Objective: Blood pressure 115/74, pulse 70, temperature (!) 97.4 F (36.3 C), temperature source Oral, resp. rate 18, height 5\' 10"  (1.778 m), weight 87 kg, SpO2 97%.   Physical exam. Body mass index is 27.52 kg/m.   General:  Average built, not in obvious distress HENT:   Mild pallor noted.  Oral mucosa is moist.  Chest:  Clear breath sounds.   No crackles or wheezes.   surgical incision clean dry and intact.   CVS: S1 &S2 heard. No murmur.  Regular rate and rhythm. Abdomen: Soft,  nontender.  Bowel sounds are heard.   Extremities: No cyanosis, clubbing or edema.  Peripheral pulses are palpable. Psych: Alert, awake and oriented, normal mood CNS:  No cranial nerve deficits.  Power equal in all extremities.   Skin: Warm and dry.  No rashes noted.  Antimicrobials: None.  DVT prophylaxis:  SCD's Start: 03/31/23 1752  Code Status:   Code Status: Limited: Do not attempt resuscitation (DNR) -DNR-LIMITED -Do Not Intubate/DNI   Consultants:  Neurosurgery   Procedures:  03/31/2023: Decompressive laminectomy T3  extending into the inferior aspect of the 2 3 disc base to below the 3 4 disc base at the level of T4 pedicle with partial facetectomy and evacuation of epidural tumor   Disposition Plan: CIR as per PT evaluation.  Pending adequate pain control.  Status is: Inpt   Data Reviewed: Results for orders placed or performed during the hospital encounter of 03/28/23 (from the past 24 hour(s))  Glucose, capillary     Status: Abnormal   Collection Time: 04/08/23  4:03 PM  Result Value Ref Range   Glucose-Capillary 153 (H) 70 - 99 mg/dL   Comment 1 Notify RN    Comment 2 Document in Chart        LOS: 12 days   Joycelyn Das, MD [facility] 04/09/2023, 9:12 AM

## 2023-04-10 DIAGNOSIS — C61 Malignant neoplasm of prostate: Secondary | ICD-10-CM | POA: Diagnosis not present

## 2023-04-10 DIAGNOSIS — F411 Generalized anxiety disorder: Secondary | ICD-10-CM | POA: Diagnosis not present

## 2023-04-10 DIAGNOSIS — E538 Deficiency of other specified B group vitamins: Secondary | ICD-10-CM | POA: Diagnosis not present

## 2023-04-10 DIAGNOSIS — M4854XD Collapsed vertebra, not elsewhere classified, thoracic region, subsequent encounter for fracture with routine healing: Secondary | ICD-10-CM | POA: Diagnosis not present

## 2023-04-10 LAB — GLUCOSE, CAPILLARY
Glucose-Capillary: 97 mg/dL (ref 70–99)
Glucose-Capillary: 118 mg/dL — ABNORMAL HIGH (ref 70–99)

## 2023-04-10 MED ORDER — GABAPENTIN 300 MG PO CAPS
300.0000 mg | ORAL_CAPSULE | Freq: Three times a day (TID) | ORAL | Status: DC
  Administered 2023-04-10 – 2023-04-13 (×10): 300 mg via ORAL
  Filled 2023-04-10 (×10): qty 1

## 2023-04-10 NOTE — Progress Notes (Signed)
Progress Note    Nathan Berg   WNU:[id number]  DOB: [birth year]  DOA: 03/28/2023     13 PCP: Malachy Mood, MD  Hospital Course:  Nathan Berg is a 67 yo male with past medical history of metastatic prostate cancer to the bone, generalized anxiety disorder initially presented to the hospital with worsening back pain. He had undergone outpatient workup with [facility] on 03/25/2023 with MRI T-spine which showed concern for new metastatic disease involving thoracic spine with concern for cord compression.  He was then referred to the ER. MRI thoracic spine repeated on admission and evaluated by surgery.  He was also started on steroids.  Patient was then admitted to hospital for further evaluation and treatment.  Patient underwent decompressive laminectomy during hospitalization and currently awaiting for rehabilitation placement.  Assessment and Plan:  * Pathological compression fracture of thoracic vertebra  MRI of the T-spine showing metastasis involving T3-T4 vertebral body with pathological compression fracture with epidural extension of tumor resulting in severe spinal stenosis and cord compression at T3 levels.   Multiple additional smaller osseous metastases scattered elsewhere throughout the thoracic spine. Neurosurgery was consulted and patient underwent Decompressive laminectomy with partial facetectomy and evacuation of epidural tumor on 03/31/23.  Initially received Decadron.  Continues to have back pain.   Dilaudid frequency has been decreased and oxycodone dose has been bumped up including addition of Advil.Continue Flexeril.  Seen by neurosurgery today and Neurontin dose has been increased.  Patient however is preferring IV Dilaudid.  Hopefully pain in the back might improve after radiation treatment.  On fentanyl patch.   JP drain removed 04/03/2023.  Continue PT OT.  Recommendation is CIR at this time.  Communicated with Dr. Kathrynn Running radiation oncology who plans to see the patient as  outpatient after the wound heals likely 14 days for incisions to heal before any radiation. He is scheduled to be presented in  spine oncology conference at 7 am Monday 10/7 for discussion of his imaging and pathology. Following that meeting, oncology will put in a plan for him.  Malignant neoplasm of prostate (HCC) With bony metastasis.  S/p robotic assisted laparoscopic radical prostatectomy and bilateral lymph node dissection in March 2015, pT2N0.  Patient follows up with Dr. Mosetta Putt oncology as outpatient. transitioning back to [place]  GAD (generalized anxiety disorder) - Continue Xanax and Lexapro  Hypokalemia.  Improved after replacement.  Latest potassium of of 4.1.    Mild hyponatremia.  Improved.  Latest sodium of 135.  Subjective. Today, patient was seen and examined at bedside.  Denies any nausea, vomiting, fever, chills or rigor.  Still complains of anterior chest wall pain.  Objective: Blood pressure 128/85, pulse 73, temperature 97.6 F (36.4 C), temperature source Oral, resp. rate 17, height 5\' 10"  (1.778 m), weight 87 kg, SpO2 98%.   Physical exam. Body mass index is 27.52 kg/m.   General:  Average built, not in obvious distress HENT:   Mild pallor noted.  Oral mucosa is moist.  Chest:  Clear breath sounds.   No crackles or wheezes.   surgical incision clean dry and intact.   CVS: S1 &S2 heard. No murmur.  Regular rate and rhythm. Abdomen: Soft,  nontender.  Bowel sounds are heard.   Extremities: No cyanosis, clubbing or edema.  Peripheral pulses are palpable. Psych: Alert, awake and oriented, normal mood CNS:  No cranial nerve deficits.  Moving extremities. Skin: Warm and dry.  No rashes noted.   Antimicrobials: None.  DVT prophylaxis:  SCD's Start: 03/31/23 1752  Code Status:   Code Status: Limited: Do not attempt resuscitation (DNR) -DNR-LIMITED -Do Not Intubate/DNI   Consultants:  Neurosurgery   Procedures:  03/31/2023: Decompressive laminectomy T3  extending into the inferior aspect of the 2 3 disc base to below the 3 4 disc base at the level of T4 pedicle with partial facetectomy and evacuation of epidural tumor   Disposition Plan:  CIR as per PT evaluation.  Pending adequate pain control.  Status is: Inpt   Data Reviewed: Results for orders placed or performed during the hospital encounter of 03/28/23 (from the past 24 hour(s))  Glucose, capillary     Status: None   Collection Time: 04/10/23  8:50 AM  Result Value Ref Range   Glucose-Capillary 97 70 - 99 mg/dL       LOS: 13 days   Joycelyn Das, MD [facility] 04/10/2023, 10:37 AM

## 2023-04-10 NOTE — Progress Notes (Signed)
This nurse educated patient on pain management this shift. Patient declined oxicodone for pain x2 this shift as he prefers dilaudid. Educated patient that IV pain medication is for breakthrough pain only and the oxicodone is what he will be discharged with oral pain medications. He also declined his advil.

## 2023-04-10 NOTE — Plan of Care (Signed)

## 2023-04-10 NOTE — Progress Notes (Signed)
Radiation Oncology         [phone]) [phone] ________________________________  Name: Nathan Berg MRN: [id number]  Date: 04/11/2023  DOB: Nov 20, 1955  STEREOTACTIC BODY RADIOTHERAPY SIMULATION AND TREATMENT PLANNING NOTE    ICD-10-CM   1. Malignant neoplasm of prostate (HCC)  C61       DIAGNOSIS:  67 yo man s/p laminectomy for metastatic prostate cancer to T3, T4  NARRATIVE:  The patient was brought to the CT Simulation planning suite.  Identity was confirmed.  All relevant records and images related to the planned course of therapy were reviewed.  The patient freely provided informed written consent to proceed with treatment after reviewing the details related to the planned course of therapy. The consent form was witnessed and verified by the simulation staff.  Then, the patient was set-up in a stable reproducible  supine position for radiation therapy.  A BodyFix immobilization pillow was fabricated for reproducible positioning.  Surface markings were placed.  The CT images were loaded into the planning software.  The gross target volumes (GTV) and planning target volumes (PTV) were delinieated, and avoidance structures were contoured.  Treatment planning then occurred.  The radiation prescription was entered and confirmed.  A total of two complex treatment devices were fabricated in the form of the BodyFix immobilization pillow and a neck accuform cushion.  I have requested : 3D Simulation  I have requested a DVH of the following structures: targets and all normal structures near the target including spinal cord, esophagus, lungs, skin and others as noted on the radiation plan to maintain doses in adherence with established limits  SPECIAL TREATMENT PROCEDURE:  The planned course of therapy using radiation constitutes a special treatment procedure. Special care is required in the management of this patient for the following reasons. High dose per fraction requiring special monitoring for increased  toxicities of treatment including daily imaging..  The special nature of the planned course of radiotherapy will require increased physician supervision and oversight to ensure patient's safety with optimal treatment outcomes.    This requires extended time and effort.    PLAN:  The patient will receive 40 Gy in 5 fractions.  ________________________________  Artist Pais Kathrynn Running, M.D.

## 2023-04-10 NOTE — Progress Notes (Signed)
Patient continues to have radiating pain into his anterior chest wall consistent with thoracic radicular symptoms.  Back pain reasonably well-controlled.  No lower extremity symptoms.  Wound healing well.  Motor and sensory function of his lower extremities normal.  Patient with metastatic disease to the spine with significant thoracic compression fracture status post decompressive surgery.  Patient likely has some continued radicular irritation secondary to foraminal collapse from his fracture.  I have increased his Neurontin in hopes of better managing his radicular symptoms.  Ultimately symptoms should improve following radiation.  No new recommendations otherwise.

## 2023-04-11 ENCOUNTER — Ambulatory Visit
Admission: RE | Admit: 2023-04-11 | Discharge: 2023-04-11 | Disposition: A | Discharge status: Home / Self Care | Payer: Medicare Other | Source: Ambulatory Visit | Attending: Radiation Oncology | Admitting: Radiation Oncology

## 2023-04-11 ENCOUNTER — Inpatient Hospital Stay (HOSPITAL_COMMUNITY): Payer: Medicare Other

## 2023-04-11 ENCOUNTER — Inpatient Hospital Stay: Payer: Medicare Other | Attending: Oncology

## 2023-04-11 ENCOUNTER — Ambulatory Visit
Admit: 2023-04-11 | Discharge: 2023-04-11 | Disposition: A | Discharge status: Home / Self Care | Payer: Medicare Other | Attending: Radiology | Admitting: Radiology

## 2023-04-11 DIAGNOSIS — C61 Malignant neoplasm of prostate: Secondary | ICD-10-CM | POA: Diagnosis not present

## 2023-04-11 DIAGNOSIS — C7951 Secondary malignant neoplasm of bone: Secondary | ICD-10-CM | POA: Insufficient documentation

## 2023-04-11 DIAGNOSIS — F411 Generalized anxiety disorder: Secondary | ICD-10-CM | POA: Diagnosis not present

## 2023-04-11 DIAGNOSIS — Z510 Encounter for antineoplastic radiation therapy: Secondary | ICD-10-CM | POA: Insufficient documentation

## 2023-04-11 DIAGNOSIS — E538 Deficiency of other specified B group vitamins: Secondary | ICD-10-CM | POA: Diagnosis not present

## 2023-04-11 DIAGNOSIS — M4854XD Collapsed vertebra, not elsewhere classified, thoracic region, subsequent encounter for fracture with routine healing: Secondary | ICD-10-CM | POA: Diagnosis not present

## 2023-04-11 LAB — GLUCOSE, CAPILLARY: Glucose-Capillary: 109 mg/dL — ABNORMAL HIGH (ref 70–99)

## 2023-04-11 MED ORDER — GADOBUTROL 1 MMOL/ML IV SOLN
10.0000 mL | Freq: Once | INTRAVENOUS | Status: AC | PRN
  Administered 2023-04-11: 10 mL via INTRAVENOUS

## 2023-04-11 NOTE — Progress Notes (Signed)
Occupational Therapy Treatment Patient Details Name: Nathan Berg MRN: [id number] DOB: 26-Apr-1956 Today's Date: 04/11/2023   History of present illness 67 yo male adm to [facility] on 9/23 with LBP, gait instability and falls; and transferred to [facility]. Pt with T3/4 metastatic disease with associated spinal cord compression. 9/26 S/P decompressive laminectomy T3 and evacuation of epidural tumor. PMHx: arthritis, metastatic prostate CA, hernia repair, prostatectomy.   OT comments  Pt progressing towards goals this session, able to complete standing grooming task and hallway distance ambulation with min A using RW, without need for seated rest break. Pt with good awareness of activity pacing/tolerance, aware of when his legs are getting weaker and need to sit. Pt educated on BUE/BLE exercises at chair level for strengthening. Pt with good demo of log roll technique, min cues to recall back precautions. Pt presenting with impairments listed below, will follow acutely. Patient will benefit from intensive inpatient follow up therapy, >3 hours/day to maximize safety/ind with ADLs/functional mobility.       If plan is discharge home, recommend the following:  A lot of help with walking and/or transfers;A little help with bathing/dressing/bathroom;Assistance with cooking/housework;Assist for transportation;Help with stairs or ramp for entrance   Equipment Recommendations  Other (comment) (defer)    Recommendations for Other Services Rehab consult    Precautions / Restrictions Precautions Precautions: Fall;Back Precaution Booklet Issued: No Precaution Comments: no brace needed; reviewed precautions during functional mobility. Restrictions Weight Bearing Restrictions: No       Mobility Bed Mobility Overal bed mobility: Needs Assistance Bed Mobility: Sidelying to Sit   Sidelying to sit: Supervision       General bed mobility comments: use of bed rail, demo's good log roll technique     Transfers Overall transfer level: Needs assistance Equipment used: Rolling walker (2 wheels) Transfers: Sit to/from Stand Sit to Stand: Min assist                 Balance Overall balance assessment: Needs assistance Sitting-balance support: No upper extremity supported, Feet supported Sitting balance-Leahy Scale: Good     Standing balance support: Single extremity supported, During functional activity Standing balance-Leahy Scale: Poor Standing balance comment: Uses at least single UE to safely stabilize.                           ADL either performed or assessed with clinical judgement   ADL Overall ADL's : Needs assistance/impaired     Grooming: Oral care;Wash/dry face;Set up;Standing Grooming Details (indicate cue type and reason): knee buckling, reliant on unilateral UE support         Upper Body Dressing : Minimal assistance;Standing Upper Body Dressing Details (indicate cue type and reason): donning gown on backside     Toilet Transfer: Minimal assistance;Ambulation;Regular Toilet;Rolling walker (2 wheels) Toilet Transfer Details (indicate cue type and reason): simulated via functional mobility         Functional mobility during ADLs: Minimal assistance;Rolling walker (2 wheels)      Extremity/Trunk Assessment Upper Extremity Assessment Upper Extremity Assessment: Generalized weakness   Lower Extremity Assessment Lower Extremity Assessment: Defer to PT evaluation        Vision   Vision Assessment?: No apparent visual deficits   Perception Perception Perception: Within Functional Limits   Praxis Praxis Praxis: WFL    Cognition Arousal: Alert Behavior During Therapy: WFL for tasks assessed/performed Overall Cognitive Status: Impaired/Different from baseline  Memory: Decreased recall of precautions         General Comments: recalls 2/3 back prec, states his thinking has decreased since chemo,  does sudoku but has difficutly with them now        Exercises Exercises: Other exercises Other Exercises Other Exercises: chair pushups -- reviewed with pt Other Exercises: seated leg kicks    Shoulder Instructions       General Comments VSS    Pertinent Vitals/ Pain       Pain Assessment Pain Assessment: No/denies pain  Home Living                                          Prior Functioning/Environment              Frequency  Min 1X/week        Progress Toward Goals  OT Goals(current goals can now be found in the care plan section)  Progress towards OT goals: Progressing toward goals  Acute Rehab OT Goals Patient Stated Goal: to get better OT Goal Formulation: With patient Time For Goal Achievement: 04/15/23 Potential to Achieve Goals: Good ADL Goals Pt Will Perform Grooming: with modified independence;standing Pt Will Perform Lower Body Dressing: with modified independence;sit to/from stand Pt Will Transfer to Toilet: with modified independence;ambulating Pt Will Perform Toileting - Clothing Manipulation and hygiene: with modified independence;sit to/from stand Pt Will Perform Tub/Shower Transfer: Shower transfer;with modified independence;ambulating;shower seat;rolling walker;grab bars  Plan      Co-evaluation                 AM-PAC OT "6 Clicks" Daily Activity     Outcome Measure   Help from another person eating meals?: None Help from another person taking care of personal grooming?: A Little Help from another person toileting, which includes using toliet, bedpan, or urinal?: A Little Help from another person bathing (including washing, rinsing, drying)?: A Lot Help from another person to put on and taking off regular upper body clothing?: A Little Help from another person to put on and taking off regular lower body clothing?: A Little 6 Click Score: 18    End of Session Equipment Utilized During Treatment: Gait  belt;Rolling walker (2 wheels)  OT Visit Diagnosis: Other abnormalities of gait and mobility (R26.89);Pain Pain - Right/Left: Right   Activity Tolerance Patient tolerated treatment well   Patient Left in chair;with call bell/phone within reach;with chair alarm set   Nurse Communication Mobility status        Time: 3329-5188 OT Time Calculation (min): 17 min  Charges: OT General Charges $OT Visit: 1 Visit OT Treatments $Self Care/Home Management : 8-22 mins  Carver Fila, OTD, OTR/L SecureChat Preferred Acute Rehab [phone]   Carver Fila Koonce 04/11/2023, 8:47 AM

## 2023-04-11 NOTE — Plan of Care (Signed)

## 2023-04-11 NOTE — Progress Notes (Signed)
Inpatient Rehab Admissions Coordinator:    CIR following for medical readiness. Will follow for Tumor board recommendations tomorrow.   Megan Salon, MS, CCC-SLP Rehab Admissions Coordinator  [phone] (celll) [phone] (office)

## 2023-04-11 NOTE — Progress Notes (Signed)
Progress Note    Nathan Berg   ZOX:[id number]  DOB: March 20, 1956  DOA: 03/28/2023     14 PCP: Nathan Mood, MD  Hospital Course:  Nathan Berg is a 67 yo male with past medical history of metastatic prostate cancer to the bone, generalized anxiety disorder initially presented to the hospital with worsening back pain. He had undergone outpatient workup with [facility] on 03/25/2023 with MRI T-spine which showed concern for new metastatic disease involving thoracic spine with concern for cord compression.  He was then referred to the ER. MRI thoracic spine repeated on admission and evaluated by surgery.  He was also started on steroids.  Patient was then admitted to hospital for further evaluation and treatment.  Patient underwent decompressive laminectomy during hospitalization and currently awaiting for rehabilitation placement.  Assessment and Plan:  * Pathological compression fracture of thoracic vertebra  MRI of the T-spine showing metastasis involving T3-T4 vertebral body with pathological compression fracture with epidural extension of tumor resulting in severe spinal stenosis and cord compression at T3 levels.   Multiple additional smaller osseous metastases scattered elsewhere throughout the thoracic spine. Neurosurgery was consulted and patient underwent Decompressive laminectomy with partial facetectomy and evacuation of epidural tumor on 03/31/23.  Initially received Decadron.  Currently on fentanyl patch, as needed IV Dilaudid, oral oxycodone Flexeril Advil and Neurontin.  After initiating Neurontin pain has gotten better.  Neurosurgery on board.    Plan for simulation today.    JP drain removed 04/03/2023.  Continue PT OT.  Recommendation is CIR at this time.  Dr. Kathrynn Berg radiation oncology recommends 14 days for incisions to heal before any radiation. He is scheduled to be presented in  spine oncology conference at 7 am Monday 10/7 for discussion of his imaging and pathology. Following that  meeting, oncology will put in a plan for him.  Malignant neoplasm of prostate (HCC) With bony metastasis.  S/p robotic assisted laparoscopic radical prostatectomy and bilateral lymph node dissection in March 2015, pT2N0.  Patient follows up with Nathan Berg oncology as outpatient. transitioning back to [place]  GAD (generalized anxiety disorder) - Continue Xanax and Lexapro  Hypokalemia.  Improved after replacement.  Latest potassium of of 4.1.  No labs from today.  Mild hyponatremia.  Improved.  Latest sodium of 135.  No labs from today.  Subjective. Today, patient was seen and examined at bedside was able to ambulate with physical therapy.  States that his pain is better than yesterday.  Denies any nausea vomiting shortness of breath cough fever chills or rigor.  Objective: Blood pressure 123/80, pulse 78, temperature 97.7 F (36.5 C), temperature source Oral, resp. rate 18, height 5\' 10"  (1.778 m), weight 88.2 kg, SpO2 97%.   Physical exam. Body mass index is 27.9 kg/m.   General:  Average built, not in obvious distress HENT:   Mild pallor noted.  Oral mucosa is moist.  Chest:  Clear breath sounds.   No crackles or wheezes.   surgical incision clean dry and intact.   CVS: S1 &S2 heard. No murmur.  Regular rate and rhythm. Abdomen: Soft,  nontender.  Bowel sounds are heard.   Extremities: No cyanosis, clubbing or edema.  Peripheral pulses are palpable. Psych: Alert, awake and oriented, normal Berg CNS:  No cranial nerve deficits.  Moving extremities.  Ambulating in the hallway. Skin: Warm and dry.  No rashes noted.   Antimicrobials: None.  DVT prophylaxis:  SCD's Start: 03/31/23 1752  Code Status:   Code Status: Limited:  Do not attempt resuscitation (DNR) -DNR-LIMITED -Do Not Intubate/DNI   Consultants:  Neurosurgery  Radiation oncology  Procedures:  03/31/2023: Decompressive laminectomy T3 extending into the inferior aspect of the 2 3 disc base to below the 3 4 disc base  at the level of T4 pedicle with partial facetectomy and evacuation of epidural tumor  Simulation for radiation treatment today  Disposition Plan:  CIR as per PT evaluation.  Plan for simulation today.  Status is: Inpt   Data Reviewed: Results for orders placed or performed during the hospital encounter of 03/28/23 (from the past 24 hour(s))  Glucose, capillary     Status: Abnormal   Collection Time: 04/10/23  4:36 PM  Result Value Ref Range   Glucose-Capillary 118 (H) 70 - 99 mg/dL       LOS: 14 days   Nathan Das, MD [facility] 04/11/2023, 10:33 AM

## 2023-04-11 NOTE — Progress Notes (Signed)
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    ICD-10-CM   1. Cancer, metastatic to bone [facility])  C79.51       HISTORY OF PRESENT ILLNESS: Nathan Berg is a 67 y.o. male seen in the clinic today to again discuss radiation therapy with Dr. Kathrynn Running. He was initially seen by Ashlyn Burning PA-C on 04/05/2023 for consultation after discussing his case with Dr. Kathrynn Running. She reviewed both conventional radiation and SRS therapy with the patient. Patient is currently in inpatient rehab at [facility]. After reviewing his case at our tumor board this morning, the consensus was to proceed with post-operative SRS treatment.   HPI from consult on 04/05/2023: Nathan Berg is a 67 y.o. male seen at the request of Dr. Wynetta Emery. He is well known to our service, having previously received salvage radiation to the prostate fossa in 2015, stereotactic body radiatiotherapy (SBRT) to oligometastatic disease in the pelvis (02/2020) and palliative radiation to the thoracic spine (T3) and 1st rib in 04/2022.  He has continued on active treatment under the care of medical oncology, most recently on Lupron ADT and Xtandi, having previously failed [place]. He was recently admitted to the hospital on 03/28/23 with new onset generalized fatigue and bilateral lower extremity weakness as well as progressive upper-mid back pain, between his shoulder blades wrapping around to chest, over the past couple of months. He had seen a spine specialist recently and had an MRI Thoracic spine at [facility] on 03/22/23 and was told that he  would need to see a neurosurgeon. Due to his gait instability and lower extremity weakness, he presented to the ED for evaluation. A repeat MRI T-spine performed on admission showed osseous metastases involving the T3 and T4 vertebral bodies with associated pathologic compression fractures and epidural extension of tumor at these levels with resultant severe spinal stenosis and cord compression at the level of T3.  There were multiple additional smaller osseous metastases scattered elsewhere throughout the thoracic spine but no other significant extraosseous or epidural extension of tumor.  He was seen by neurosurgeon, Dr. Wynetta Emery, who recommended decompressive thoracic laminectomy at T3, inclusive of part of T4.  He underwent decompressive laminectomy at T3, extending into the inferior aspect of the T2-T3 disc base to below the T3-4 disc base at the level of the T4 pedicle, with partial facetectomy and evacuation of epidural tumor on 03/31/2023. The patient reports significant pain relief since that time but has continued with weakness in the lower extremities and is awaiting a bed in inpatient rehab so that he can continue working with physical therapy.  We have been asked to consult the patient today for consideration of postoperative re-irradiation to the progressive disease at T3-T4.   PREVIOUS RADIATION THERAPY: Yes  04/26/22 - 05/07/22:  The painful metastases in the 1st rib and T3 vertebral body were treated to 30 Gy in 10 fractions.     02/13/20 - 02/22/20:  The right iliac crest and right internal iliac node targets were treated to 50 Gy in 5 fractions of 10 Gy each   05/06/14 -  06/26/14: Prostatic Fossa / 68.4 Gy in 38 fractions of 1.8 Gy (in combination with ST-ADT)  PAST MEDICAL HISTORY:  Past Medical History:  Diagnosis Date   Anxiety    Arthritis    Cancer (HCC)    Prostate cancer   Depression    GERD (gastroesophageal reflux disease)    H/O seasonal allergies    History of kidney stones     x1   Prostate cancer (HCC)       PAST SURGICAL HISTORY: Past Surgical History:  Procedure Laterality Date   CHEST TUBE INSERTION  07/16/2019   trauma    COLONOSCOPY     HERNIA REPAIR Bilateral    20 yrs ago   LAMINECTOMY N/A 03/31/2023   Procedure: THORACIC LAMINECTOMY FOR TUMOR;  Surgeon: Donalee Citrin, MD;  Location: [facility] OR;  Service: Neurosurgery;  Laterality: N/A;   LYMPHADENECTOMY Bilateral 09/19/2013   Procedure: LYMPHADENECTOMY  PELVIC LYMPH NODE DISSECTION;  Surgeon: Valetta Fuller, MD;  Location: [facility] ORS;  Service: Urology;  Laterality: Bilateral;   PROSTATE BIOPSY     ROBOT ASSISTED LAPAROSCOPIC RADICAL PROSTATECTOMY N/A 09/19/2013   Procedure: ROBOTIC ASSISTED LAPAROSCOPIC RADICAL PROSTATECTOMY;  Surgeon: Valetta Fuller, MD;  Location: [facility] ORS;  Service: Urology;  Laterality: N/A;   TONSILLECTOMY     child   VARICOCELE EXCISION      FAMILY HISTORY:  Family History  Problem Relation Age of Onset   Prostate cancer Father    Breast cancer Neg Hx    Colon cancer Neg Hx    Pancreatic cancer Neg Hx     SOCIAL HISTORY:  Social History   Socioeconomic History   Marital status: Married    Spouse name: Not on file   Number of children: Not on file   Years of education: Not on file   Highest education level: Not on file  Occupational History   Not on file  Tobacco Use   Smoking status: Never   Smokeless tobacco: Never  Vaping Use   Vaping status: Never Used  Substance and Sexual Activity   Alcohol use: Yes    Comment: 4 beers per week   Drug use: No   Sexual activity: Yes  Other Topics Concern   Not on file  Social History Narrative   Not on file   Social Determinants of Health   Financial Resource Strain: Not on file  Food Insecurity: No Food Insecurity (03/29/2023)   Hunger Vital Sign    Worried About Running Out of Food in the Last Year: Never true    Ran Out of Food in the Last Year: Never true  Transportation Needs: No Transportation Needs (03/29/2023)    PRAPARE - Administrator, Civil Service (Medical): No    Lack of Transportation (Non-Medical): No  Physical Activity: Not on file  Stress: Not on file  Social Connections: Unknown (11/16/2021)   [facility]   Social Network    Social Network: Not on file  Intimate Partner Violence: Not At Risk (03/29/2023)   Humiliation, Afraid, Rape, and Kick questionnaire    Fear of Current or Ex-Partner: No    Emotionally Abused: No    Physically Abused: No    Sexually Abused: No    ALLERGIES: Codeine and Penicillin g  MEDICATIONS:  No current facility-administered medications for this encounter.   No current outpatient medications on file.   Facility-Administered Medications Ordered in Other Encounters  Medication Dose Route Frequency Provider Last Rate  Last Admin   acetaminophen (TYLENOL) tablet 650 mg  650 mg Oral QID Lewie Chamber, MD   650 mg at 04/10/23 2240   ALPRAZolam Prudy Feeler) tablet 0.25 mg  0.25 mg Oral BID PRN Lewie Chamber, MD   0.25 mg at 04/10/23 2239   cyanocobalamin (VITAMIN B12) tablet 1,000 mcg  1,000 mcg Oral Daily Melven Sartorius, MD   1,000 mcg at 04/11/23 0856   cyclobenzaprine (FLEXERIL) tablet 10 mg  10 mg Oral TID PRN Lewie Chamber, MD   10 mg at 04/10/23 0855   dexamethasone (DECADRON) injection 10 mg  10 mg Intravenous Q24H Lewie Chamber, MD   10 mg at 04/11/23 0857   docusate sodium (COLACE) capsule 100 mg  100 mg Oral BID Pokhrel, Laxman, MD   100 mg at 04/11/23 0857   enzalutamide (XTANDI) tablet 160 mg  160 mg Oral Daily Lewie Chamber, MD   160 mg at 04/11/23 1225   escitalopram (LEXAPRO) tablet 30 mg  30 mg Oral Daily Pokhrel, Laxman, MD   30 mg at 04/11/23 0855   fentaNYL (DURAGESIC) 12 MCG/HR 1 patch  1 patch Transdermal Christin Fudge, MD   1 patch at 04/09/23 0910   gabapentin (NEURONTIN) capsule 300 mg  300 mg Oral TID Julio Sicks, MD   300 mg at 04/11/23 0857   HYDROmorphone (DILAUDID) injection 1 mg  1  mg Intravenous Q4H PRN Pokhrel, Laxman, MD   1 mg at 04/11/23 1610   ibuprofen (ADVIL) tablet 400 mg  400 mg Oral Q6H PRN Pokhrel, Laxman, MD   400 mg at 04/11/23 0612   lactulose (CHRONULAC) 10 GM/15ML solution 20 g  20 g Oral BID PRN Absher, Ky Barban, RPH       lidocaine (LIDODERM) 5 % 1 patch  1 patch Transdermal Q24H Lewie Chamber, MD   1 patch at 04/10/23 2243   magnesium oxide (MAG-OX) tablet 400 mg  400 mg Oral BID Pokhrel, Laxman, MD   400 mg at 04/11/23 0856   melatonin tablet 3 mg  3 mg Oral QHS PRN Lewie Chamber, MD   3 mg at 04/08/23 2116   menthol-cetylpyridinium (CEPACOL) lozenge 3 mg  1 lozenge Oral PRN Lewie Chamber, MD       Or   phenol (CHLORASEPTIC) mouth spray 1 spray  1 spray Mouth/Throat PRN Lewie Chamber, MD       naloxone [facility] injection 0.4 mg  0.4 mg Intravenous PRN Lewie Chamber, MD       ondansetron [facility]) injection 4 mg  4 mg Intravenous Q6H PRN Lewie Chamber, MD       ondansetron [facility]) tablet 4 mg  4 mg Oral Q6H PRN Lewie Chamber, MD       Or   ondansetron [facility]) injection 4 mg  4 mg Intravenous Q6H PRN Lewie Chamber, MD       oxyCODONE (Oxy IR/ROXICODONE) immediate release tablet 10 mg  10 mg Oral Q4H PRN Pokhrel, Laxman, MD   10 mg at 04/11/23 1224   pantoprazole (PROTONIX) EC tablet 40 mg  40 mg Oral BID AC Pokhrel, Laxman, MD   40 mg at 04/11/23 0612   polyethylene glycol (MIRALAX / GLYCOLAX) packet 17 g  17 g Oral Daily PRN Lewie Chamber, MD       QUEtiapine (SEROQUEL) tablet 200 mg  200 mg Oral QHS Pokhrel, Laxman, MD   200 mg at 04/10/23 2235   senna-docusate (Senokot-S) tablet 1 tablet  1 tablet Oral BID Lewie Chamber, MD  1 tablet at 04/11/23 0857   simethicone (MYLICON) 40 MG/0.6ML suspension 80 mg  80 mg Oral QID PRN Pokhrel, Laxman, MD       traZODone (DESYREL) tablet 25 mg  25 mg Oral QHS Pokhrel, Laxman, MD   25 mg at 04/10/23 2241   And   traZODone (DESYREL) tablet 25 mg  25 mg Oral QHS PRN Pokhrel, Laxman, MD   25 mg at  04/08/23 2257    REVIEW OF SYSTEMS:  On review of systems, the patient reports that he is doing well overall since his surgery. His pain has improved significantly since the procedure. He continues to be on pain medication regularly. He endorses continued lower extremity weakness, but denies any new neurologic symptoms.     PHYSICAL EXAM:  Wt Readings from Last 3 Encounters:  04/11/23 194 lb 7.1 oz (88.2 kg)  02/16/23 182 lb 1.6 oz (82.6 kg)  12/23/22 184 lb 3.2 oz (83.6 kg)   Temp Readings from Last 3 Encounters:  04/11/23 98.3 F (36.8 C) (Oral)  02/16/23 98.4 F (36.9 C) (Oral)  12/23/22 98.4 F (36.9 C) (Oral)   BP Readings from Last 3 Encounters:  04/11/23 128/85  02/16/23 115/74  12/23/22 110/79   Pulse Readings from Last 3 Encounters:  04/11/23 84  02/16/23 69  12/23/22 80    /10  In general this is a well appearing male in no acute distress. He's alert and oriented x4 and appropriate throughout the examination. Cardiopulmonary assessment is negative for acute distress and he exhibits normal effort.       KPS = 40  100 - Normal; no complaints; no evidence of disease. 90   - Able to carry on normal activity; minor signs or symptoms of disease. 80   - Normal activity with effort; some signs or symptoms of disease. 46   - Cares for self; unable to carry on normal activity or to do active work. 60   - Requires occasional assistance, but is able to care for most of his personal needs. 50   - Requires considerable assistance and frequent medical care. 40   - Disabled; requires special care and assistance. 30   - Severely disabled; hospital admission is indicated although death not imminent. 20   - Very sick; hospital admission necessary; active supportive treatment necessary. 10   - Moribund; fatal processes progressing rapidly. 0     - Dead  Karnofsky DA, Abelmann WH, Craver LS and Burchenal JH [phone]) The use of the nitrogen mustards in the palliative treatment of  carcinoma: with particular reference to bronchogenic carcinoma Cancer 1 [id number]  LABORATORY DATA:  Lab Results  Component Value Date   WBC 4.2 04/08/2023   HGB 9.0 (L) 04/08/2023   HCT 27.2 (L) 04/08/2023   MCV 98.9 04/08/2023   PLT 296 04/08/2023   Lab Results  Component Value Date   NA 135 04/08/2023   K 4.1 04/08/2023   CL 102 04/08/2023   CO2 26 04/08/2023   Lab Results  Component Value Date   ALT 32 03/28/2023   AST 27 03/28/2023   ALKPHOS 99 03/28/2023   BILITOT 0.6 03/28/2023     RADIOGRAPHY: DG Thoracic Spine 1 View  Result Date: 03/31/2023 CLINICAL DATA:  Thoracic laminectomy for tumor. EXAM: OPERATIVE THORACIC SPINE 1 VIEW(S) COMPARISON:  03/30/2023. FINDINGS: A single fluoroscopic image was obtained intraoperatively for thoracic laminectomy. Total fluoroscopy time is 2.9 seconds. Dose: 0.5 mGy. Please see operative report for additional information. IMPRESSION: Intraoperative  utilization of fluoroscopy. Electronically Signed   By: Thornell Sartorius M.D.   On: 03/31/2023 20:01   DG C-Arm 1-60 Min-No Report  Result Date: 03/31/2023 Fluoroscopy was utilized by the requesting physician.  No radiographic interpretation.   MR OUTSIDE FILMS SPINE  Result Date: 03/30/2023 This examination belongs to an outside facility and is stored here for comparison purposes only.  Contact the originating outside institution for any associated report or interpretation.  CT THORACIC SPINE WO CONTRAST  Result Date: 03/30/2023 CLINICAL DATA:  Thoracic compression fracture.  Known malignancy. EXAM: CT THORACIC SPINE WITHOUT CONTRAST TECHNIQUE: Multidetector CT images of the thoracic were obtained using the standard protocol without intravenous contrast. RADIATION DOSE REDUCTION: This exam was performed according to the departmental dose-optimization program which includes automated exposure control, adjustment of the mA and/or kV according to patient size and/or use of iterative reconstruction  technique. COMPARISON:  Thoracic spine MRI 1 day prior FINDINGS: Alignment: Normal. Vertebrae: Again seen is marked compression deformity of the T3 vertebral body with near-complete loss of vertebral body height and 4 mm bony retropulsion consistent with pathologic fracture in the setting of osseous metastatic disease. There is also a nondisplaced fracture through the right T3 lamina and transverse process extending to the inferior articulating process (5-23, 7-37). Also again seen is the more mild pathologic compression fracture of the T4 vertebral body with approximately 40% loss of vertebral body height anteriorly and no bony retropulsion. There is a nondisplaced fracture of the right T4 transverse process (5-31, 7-32). The other vertebral body heights are preserved. The bones are diffusely demineralized. Known additional metastatic lesions as well as epidural tumor at T3 and T4 are better seen on the thoracic spine MRI from 1 day prior. Paraspinal and other soft tissues: Nodular scarring in the medial right lung apex is unchanged. A probable sebaceous cyst is noted in the subcutaneous fat just to the left of midline at the T1 level. A large hiatal hernia is unchanged. Disc levels: There is overall mild underlying degenerative change throughout the thoracic spine. Known severe spinal canal stenosis at T3 is better seen on the MRI. IMPRESSION: 1. Pathologic compression fractures of the T3 and T4 vertebral bodies are similar to the prior MRI, worse at T3 where there is essentially complete loss of vertebral body height and bony retropulsion. Known severe spinal canal stenosis due to the retropulsion and epidural tumor is better seen on the prior MRI. 2. Additional nondisplaced fractures of the right T3 lamina, transverse process, and inferior articulating process, and right T4 transverse process. Electronically Signed   By: Lesia Hausen M.D.   On: 03/30/2023 13:50   MR THORACIC SPINE W WO CONTRAST  Result Date:  03/30/2023 CLINICAL DATA:  Initial evaluation for musculoskeletal neoplasm, history of prostate cancer. EXAM: MRI THORACIC WITHOUT AND WITH CONTRAST TECHNIQUE: Multiplanar and multiecho pulse sequences of the thoracic spine were obtained without and with intravenous contrast. CONTRAST:  9mL GADAVIST GADOBUTROL 1 MMOL/ML IV SOLN COMPARISON:  Prior study from 12/09/2022. FINDINGS: Alignment: Vertebral bodies normally aligned with preservation of the normal thoracic kyphosis. Trace degenerative retrolisthesis of L1 on L2 noted. Vertebrae: Osseous metastasis involving the T3 vertebral body is seen. Associated pathologic compression fracture with vertebral plana formation and up to 6 mm of bony retropulsion. Additional osseous metastasis involves the entirety of the T4 vertebral body. Associated 40% height loss without significant bony retropulsion. Tumor extends to involve the posterior elements bilaterally. Epidural extension of tumor at these levels with tumor within  the ventral epidural space, most pronounced at T3 where there is resultant severe spinal stenosis and cord compression (series 10, image 17). Secondary cord flattening with suspected early/mild cord signal changes (series 4, image 13). Thecal sac measures approximately 4-5 mm in AP diameter. No more than mild stenosis inferiorly at the level of T4. Moderate to severe bilateral foraminal narrowing noted at T2-3 and T3-4 as well. Multiple additional smaller osseous metastases seen scattered elsewhere throughout the thoracic spine. For reference purposes, the largest additional lesion involves the inferior aspect of T2 and measures 1.5 cm (series 5, image 12). No other significant extra osseous or epidural extension of tumor. No other pathologic compression fracture. Few additional scattered osseous metastases noted within the cervical spine on counter sequence. No visible extra osseous or epidural extension of tumor within the cervical spine itself. Cord:  Cord impingement with suspected subtle early cord signal changes at the level of T3 as above. Otherwise normal signal and morphology. Paraspinal and other soft tissues: Paraspinous soft tissues demonstrate no acute finding. Small layering left pleural effusion noted. Disc levels: Underlying mild for age multilevel thoracic spondylosis elsewhere throughout the thoracic spine. Scattered superimposed multilevel facet disease. No other significant spinal stenosis. Mild multilevel foraminal narrowing noted within the lower thoracic spine at T10-11 bilaterally and T12-L1 on the left. IMPRESSION: 1. Osseous metastases involving the T3 and T4 vertebral bodies with associated pathologic compression fractures as above. Epidural extension of tumor at these levels with resultant severe spinal stenosis and cord compression at the level of T3. Probable subtle early/mild cord signal changes at this level. 2. Multiple additional smaller osseous metastases scattered elsewhere throughout the thoracic spine. No other significant extra osseous or epidural extension of tumor. 3. Underlying mild for age thoracic spondylosis without other high-grade spinal stenosis. Mild multilevel foraminal narrowing within the lower thoracic spine as above. 4. Small layering left pleural effusion. Electronically Signed   By: Rise Mu M.D.   On: 03/30/2023 04:25      IMPRESSION/PLAN: 1. 67 y.o. 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 As stated above, his case was discussed in tumor board this morning. The consensus was to proceed with post-operative stereotactic radiosurgery [facility]) to the T3/T4 spine. We shared this discussion with the patient and he would like to proceed with radiation treatment. We again reviewed the anticipated acute and late sequelae associated with radiation which he is very  familiar with. The patient was encouraged to ask questions that were answered to his stated satisfaction and he is in agreement with the stated plan.   He is scheduled for CT simulation later today. A consent form was signed and placed in his chart. Patient is getting scheduled for a post-operative MRI of the T-spine. He is currently at [facility] for inpatient rehab. We will review this imaging prior to proceeding with his first treatment. We will coordinate his schedule with their team to limit disruptions in his treatment. Anticipate 5 fractions of SRS to the T3/T4 under the care of the radiation team and Dr. Wynetta Emery.   I personally spent 30 minutes in this encounter including chart review, reviewing radiological studies, meeting face-to-face with the patient, entering orders and completing documentation.     Joyice Faster, PA-C    Margaretmary Dys, MD  [facility]  Radiation Oncology Direct Dial: [phone]  Fax: [phone] [email or web address]  Skype  LinkedIn

## 2023-04-12 DIAGNOSIS — E538 Deficiency of other specified B group vitamins: Secondary | ICD-10-CM | POA: Diagnosis not present

## 2023-04-12 DIAGNOSIS — F411 Generalized anxiety disorder: Secondary | ICD-10-CM | POA: Diagnosis not present

## 2023-04-12 DIAGNOSIS — C61 Malignant neoplasm of prostate: Secondary | ICD-10-CM | POA: Diagnosis not present

## 2023-04-12 DIAGNOSIS — M4854XD Collapsed vertebra, not elsewhere classified, thoracic region, subsequent encounter for fracture with routine healing: Secondary | ICD-10-CM | POA: Diagnosis not present

## 2023-04-12 LAB — GLUCOSE, CAPILLARY: Glucose-Capillary: 124 mg/dL — ABNORMAL HIGH (ref 70–99)

## 2023-04-12 NOTE — Progress Notes (Signed)
Physical Therapy Treatment Patient Details Name: Nathan Berg MRN: [id number] DOB: [birth year] Today's Date: 04/12/2023   History of Present Illness 67 yo male adm to [facility] on 9/23 with LBP, gait instability and falls; and transferred to [facility]. Pt with T3/4 metastatic disease with associated spinal cord compression. 9/26 S/P decompressive laminectomy T3 and evacuation of epidural tumor. PMHx: arthritis, metastatic prostate CA, hernia repair, prostatectomy.    PT Comments  Pt greeted up in chair on arrival and agreeable to session with steady progress towards acute goals. Pt continues to be limited by BLE weakness, decreased activity tolerance, and impaired coordination of Les. Pt requiring grossly min A for gait with RW support with pt needing cues for gait mechanics and assist to steady as pt with x3 bil knee buckling. Pt performing serial sit<>stand transfers for increased LE strength and control. Current plan remains appropriate to address deficits and maximize functional independence and decrease caregiver burden. Pt continues to benefit from skilled PT services to progress toward functional mobility goals.     If plan is discharge home, recommend the following: Assistance with cooking/housework;Assist for transportation;Help with stairs or ramp for entrance;A little help with walking and/or transfers;A little help with bathing/dressing/bathroom   Can travel by private vehicle        Equipment Recommendations  BSC/3in1;Wheelchair (measurements PT);Wheelchair cushion (measurements PT);Rolling walker (2 wheels)    Recommendations for Other Services       Precautions / Restrictions Precautions Precautions: Fall;Back Precaution Booklet Issued: No Precaution Comments: no brace needed; reviewed precautions during functional mobility. Required Braces or Orthoses:  (no brace needed orders) Restrictions Weight Bearing Restrictions: No     Mobility  Bed Mobility Overal bed mobility: Needs  Assistance             General bed mobility comments: up in chair on arrival    Transfers Overall transfer level: Needs assistance Equipment used: Rolling walker (2 wheels), None Transfers: Sit to/from Stand Sit to Stand: Min assist, Contact guard assist           General transfer comment: CGA from recliner to RW, min A with serial sit<>stand without AD support    Ambulation/Gait Ambulation/Gait assistance: Min assist Gait Distance (Feet): 136 Feet Assistive device: Rolling walker (2 wheels) Gait Pattern/deviations: Decreased dorsiflexion - right, Decreased stride length, Scissoring, Step-through pattern, Narrow base of support, Ataxic Gait velocity: decreased     General Gait Details: cues for posture, step width and control, bil knee flexion throughout with pt buckling x3, cues for shorter step length as pt leaving feet behind   Stairs             Wheelchair Mobility     Tilt Bed    Modified Rankin (Stroke Patients Only)       Balance Overall balance assessment: Needs assistance Sitting-balance support: No upper extremity supported, Feet supported Sitting balance-Leahy Scale: Good     Standing balance support: Single extremity supported, During functional activity Standing balance-Leahy Scale: Poor Standing balance comment: Uses at least single UE to safely stabilize.                            Cognition Arousal: Alert Behavior During Therapy: WFL for tasks assessed/performed Overall Cognitive Status: Impaired/Different from baseline Area of Impairment: Safety/judgement                     Memory: Decreased recall of precautions   Safety/Judgement: Decreased  awareness of deficits   Problem Solving: Difficulty sequencing, Requires verbal cues          Exercises General Exercises - Lower Extremity Long Arc Quad: Strengthening, Both, 10 reps, Seated Other Exercises Other Exercises: serial sit<>stand x5    General  Comments General comments (skin integrity, edema, etc.): VSS      Pertinent Vitals/Pain Pain Assessment Pain Assessment: Faces Faces Pain Scale: Hurts a little bit Pain Location: L flank Pain Descriptors / Indicators: Aching Pain Intervention(s): Monitored during session, Limited activity within patient's tolerance    Home Living                          Prior Function            PT Goals (current goals can now be found in the care plan section) Acute Rehab PT Goals Patient Stated Goal: Be able to go home. PT Goal Formulation: With patient Time For Goal Achievement: 04/15/23 Progress towards PT goals: Progressing toward goals    Frequency    Min 1X/week      PT Plan      Co-evaluation              AM-PAC PT "6 Clicks" Mobility   Outcome Measure  Help needed turning from your back to your side while in a flat bed without using bedrails?: A Little Help needed moving from lying on your back to sitting on the side of a flat bed without using bedrails?: A Little Help needed moving to and from a bed to a chair (including a wheelchair)?: A Little Help needed standing up from a chair using your arms (e.g., wheelchair or bedside chair)?: A Little Help needed to walk in hospital room?: A Little Help needed climbing 3-5 steps with a railing? : A Lot 6 Click Score: 17    End of Session Equipment Utilized During Treatment: Gait belt Activity Tolerance: Patient tolerated treatment well Patient left: with call bell/phone within reach;in chair Nurse Communication: Mobility status PT Visit Diagnosis: Unsteadiness on feet (R26.81);Other abnormalities of gait and mobility (R26.89);Other symptoms and signs involving the nervous system (R29.898)     Time: 7564-3329 PT Time Calculation (min) (ACUTE ONLY): 19 min  Charges:    $Gait Training: 8-22 mins PT General Charges $$ ACUTE PT VISIT: 1 Visit                     Raney Koeppen R. PTA Acute Rehabilitation  Services Office: [phone]   Catalina Antigua 04/12/2023, 9:26 AM

## 2023-04-12 NOTE — Plan of Care (Signed)

## 2023-04-12 NOTE — Progress Notes (Signed)
Progress Note    JAZMIN GRADILLAS   JXB:[id number]  DOB: 17-Mar-1956  DOA: 03/28/2023     15 PCP: Malachy Mood, MD  Hospital Course:  Mr. Eberl is a 67 yo male with past medical history of metastatic prostate cancer to the bone, generalized anxiety disorder initially presented to the hospital with worsening back pain. He had undergone outpatient workup with [facility] on 03/25/2023 with MRI T-spine which showed concern for new metastatic disease involving thoracic spine with concern for cord compression.  He was then referred to the ER. MRI thoracic spine repeated on admission and evaluated by neurosurgery, started on steroids.  Patient was then admitted to hospital for further evaluation and treatment.    During hospitalization, patient underwent decompressive laminectomy  by neurosurgery and is currently awaiting for rehabilitation placement.  Patient is on multiple regimen for pain medication and still complains of pain.  Had CT simulation done for radiation treatment in the future.  Assessment and Plan:  * Pathological compression fracture of thoracic vertebra  MRI of the T-spine showing metastasis involving T3-T4 vertebral body with pathological compression fracture with epidural extension of tumor resulting in severe spinal stenosis and cord compression at T3 levels.   Multiple additional smaller osseous metastases scattered elsewhere throughout the thoracic spine. Neurosurgery was consulted and patient underwent decompressive laminectomy with partial facetectomy and evacuation of epidural tumor on 03/31/23.  Initially received Decadron.  Currently on fentanyl patch, as needed IV Dilaudid, oral oxycodone, Flexeril, Advil and Neurontin for pain management..  Neurontin was initiated recently by neurosurgery.  JP drain removed 04/03/2023.  Continue PT OT.  Recommendation is CIR at this time.  Dr. Kathrynn Running radiation oncology recommends 14 days for incisions to heal before any radiation.  Patient was  presented spine oncology conference at 7 am Monday 10/7 for discussion of his imaging and pathology.  Oncology to put forth plan on him.  Current problem is adequate pain management.  Hopefully pain gets better after initiation of radiation treatment.  Malignant neoplasm of prostate (HCC) With bony metastasis.  S/p robotic assisted laparoscopic radical prostatectomy and bilateral lymph node dissection in March 2015, pT2N0.  Patient follows up with Dr. Mosetta Putt oncology as outpatient. transitioning back to [place]  GAD (generalized anxiety disorder) - Continue Xanax and Lexapro  Hypokalemia.  Improved after replacement.  Latest potassium of of 4.1.  No labs from today.  Mild hyponatremia.  Improved.  Latest sodium of 135.  No labs from today.   BMP in AM  Deconditioning, debility.  Seen by physical therapy and recommend CIR.  Subjective. Today, patient was seen and examined at bedside.  States that he has more pain in his chest wall today than yesterday.  He however went through transportation and simulation yesterday with radiation department.  Has not had a bowel movement in few days and hopes to have one today.  Objective: Blood pressure 124/77, pulse 87, temperature 97.6 F (36.4 C), temperature source Oral, resp. rate 18, height 5\' 10"  (1.778 m), weight 89.5 kg, SpO2 98%.   Physical exam. Body mass index is 28.31 kg/m.   General:  Average built, not in obvious distress HENT:   Mild pallor noted.  Oral mucosa is moist.  Chest:  Clear breath sounds.   No crackles or wheezes.   surgical incision clean dry and intact.  Tenderness on palpation. CVS: S1 &S2 heard. No murmur.  Regular rate and rhythm. Abdomen: Soft,  nontender.  Bowel sounds are heard.   Extremities: No cyanosis,  clubbing or edema.  Peripheral pulses are palpable. Psych: Alert, awake and oriented, normal mood CNS:   Moving extremities.  Ambulating in the hallway. Skin: Warm and dry.  No rashes  noted.   Antimicrobials: None.  DVT prophylaxis:  SCD's Start: 03/31/23 1752  Code Status:   Code Status: Limited: Do not attempt resuscitation (DNR) -DNR-LIMITED -Do Not Intubate/DNI   Consultants:  Neurosurgery  Radiation oncology  Procedures:  03/31/2023: Decompressive laminectomy T3 extending into the inferior aspect of the 2 3 disc base to below the 3 4 disc base at the level of T4 pedicle with partial facetectomy and evacuation of epidural tumor   Disposition Plan:  CIR as per PT evaluation.    Status is: Inpt   Data Reviewed: Results for orders placed or performed during the hospital encounter of 03/28/23 (from the past 24 hour(s))  Glucose, capillary     Status: Abnormal   Collection Time: 04/11/23  5:20 PM  Result Value Ref Range   Glucose-Capillary 109 (H) 70 - 99 mg/dL       LOS: 15 days   Joycelyn Das, MD [facility] 04/12/2023, 9:25 AM

## 2023-04-13 ENCOUNTER — Encounter (HOSPITAL_COMMUNITY): Payer: Self-pay | Admitting: Physical Medicine and Rehabilitation

## 2023-04-13 ENCOUNTER — Inpatient Hospital Stay (HOSPITAL_COMMUNITY)
Admission: AD | Admit: 2023-04-13 | Discharge: 2023-04-30 | DRG: 559 | Disposition: A | Discharge status: Home Health | Payer: Medicare Other | Source: Intra-hospital | Attending: Physical Medicine and Rehabilitation | Admitting: Physical Medicine and Rehabilitation

## 2023-04-13 ENCOUNTER — Other Ambulatory Visit: Payer: Self-pay

## 2023-04-13 DIAGNOSIS — T451X5D Adverse effect of antineoplastic and immunosuppressive drugs, subsequent encounter: Secondary | ICD-10-CM | POA: Diagnosis not present

## 2023-04-13 DIAGNOSIS — B028 Zoster with other complications: Secondary | ICD-10-CM | POA: Diagnosis not present

## 2023-04-13 DIAGNOSIS — Z8042 Family history of malignant neoplasm of prostate: Secondary | ICD-10-CM | POA: Diagnosis not present

## 2023-04-13 DIAGNOSIS — G9529 Other cord compression: Secondary | ICD-10-CM | POA: Diagnosis present

## 2023-04-13 DIAGNOSIS — F4320 Adjustment disorder, unspecified: Secondary | ICD-10-CM | POA: Diagnosis not present

## 2023-04-13 DIAGNOSIS — Z87442 Personal history of urinary calculi: Secondary | ICD-10-CM | POA: Diagnosis not present

## 2023-04-13 DIAGNOSIS — S22030D Wedge compression fracture of third thoracic vertebra, subsequent encounter for fracture with routine healing: Secondary | ICD-10-CM | POA: Diagnosis present

## 2023-04-13 DIAGNOSIS — E871 Hypo-osmolality and hyponatremia: Secondary | ICD-10-CM | POA: Diagnosis present

## 2023-04-13 DIAGNOSIS — D630 Anemia in neoplastic disease: Secondary | ICD-10-CM | POA: Diagnosis present

## 2023-04-13 DIAGNOSIS — Z8546 Personal history of malignant neoplasm of prostate: Secondary | ICD-10-CM

## 2023-04-13 DIAGNOSIS — G8222 Paraplegia, incomplete: Secondary | ICD-10-CM | POA: Diagnosis not present

## 2023-04-13 DIAGNOSIS — C7940 Secondary malignant neoplasm of unspecified part of nervous system: Secondary | ICD-10-CM | POA: Diagnosis not present

## 2023-04-13 DIAGNOSIS — D519 Vitamin B12 deficiency anemia, unspecified: Secondary | ICD-10-CM | POA: Diagnosis present

## 2023-04-13 DIAGNOSIS — M8458XD Pathological fracture in neoplastic disease, other specified site, subsequent encounter for fracture with routine healing: Secondary | ICD-10-CM | POA: Diagnosis present

## 2023-04-13 DIAGNOSIS — D649 Anemia, unspecified: Secondary | ICD-10-CM | POA: Diagnosis present

## 2023-04-13 DIAGNOSIS — F411 Generalized anxiety disorder: Secondary | ICD-10-CM | POA: Diagnosis present

## 2023-04-13 DIAGNOSIS — M4804 Spinal stenosis, thoracic region: Principal | ICD-10-CM | POA: Diagnosis present

## 2023-04-13 DIAGNOSIS — B029 Zoster without complications: Secondary | ICD-10-CM

## 2023-04-13 DIAGNOSIS — F32A Depression, unspecified: Secondary | ICD-10-CM | POA: Diagnosis present

## 2023-04-13 DIAGNOSIS — Z923 Personal history of irradiation: Secondary | ICD-10-CM

## 2023-04-13 DIAGNOSIS — G992 Myelopathy in diseases classified elsewhere: Secondary | ICD-10-CM | POA: Diagnosis present

## 2023-04-13 DIAGNOSIS — M546 Pain in thoracic spine: Secondary | ICD-10-CM

## 2023-04-13 DIAGNOSIS — G8929 Other chronic pain: Secondary | ICD-10-CM | POA: Diagnosis present

## 2023-04-13 DIAGNOSIS — R011 Cardiac murmur, unspecified: Secondary | ICD-10-CM | POA: Diagnosis not present

## 2023-04-13 DIAGNOSIS — C7951 Secondary malignant neoplasm of bone: Secondary | ICD-10-CM | POA: Diagnosis present

## 2023-04-13 DIAGNOSIS — M4854XD Collapsed vertebra, not elsewhere classified, thoracic region, subsequent encounter for fracture with routine healing: Secondary | ICD-10-CM | POA: Diagnosis not present

## 2023-04-13 DIAGNOSIS — K219 Gastro-esophageal reflux disease without esophagitis: Secondary | ICD-10-CM | POA: Diagnosis present

## 2023-04-13 DIAGNOSIS — Z79899 Other long term (current) drug therapy: Secondary | ICD-10-CM

## 2023-04-13 DIAGNOSIS — E538 Deficiency of other specified B group vitamins: Secondary | ICD-10-CM | POA: Diagnosis not present

## 2023-04-13 DIAGNOSIS — G8220 Paraplegia, unspecified: Secondary | ICD-10-CM | POA: Diagnosis present

## 2023-04-13 DIAGNOSIS — R600 Localized edema: Secondary | ICD-10-CM | POA: Diagnosis not present

## 2023-04-13 DIAGNOSIS — M1990 Unspecified osteoarthritis, unspecified site: Secondary | ICD-10-CM | POA: Diagnosis present

## 2023-04-13 DIAGNOSIS — Z885 Allergy status to narcotic agent status: Secondary | ICD-10-CM | POA: Diagnosis not present

## 2023-04-13 DIAGNOSIS — E876 Hypokalemia: Secondary | ICD-10-CM | POA: Diagnosis present

## 2023-04-13 DIAGNOSIS — S22030A Wedge compression fracture of third thoracic vertebra, initial encounter for closed fracture: Principal | ICD-10-CM | POA: Diagnosis present

## 2023-04-13 DIAGNOSIS — D84821 Immunodeficiency due to drugs: Secondary | ICD-10-CM | POA: Diagnosis present

## 2023-04-13 DIAGNOSIS — G0491 Myelitis, unspecified: Secondary | ICD-10-CM | POA: Diagnosis present

## 2023-04-13 DIAGNOSIS — K5900 Constipation, unspecified: Secondary | ICD-10-CM | POA: Diagnosis present

## 2023-04-13 DIAGNOSIS — C61 Malignant neoplasm of prostate: Secondary | ICD-10-CM | POA: Diagnosis not present

## 2023-04-13 DIAGNOSIS — I10 Essential (primary) hypertension: Secondary | ICD-10-CM | POA: Diagnosis present

## 2023-04-13 DIAGNOSIS — M4714 Other spondylosis with myelopathy, thoracic region: Secondary | ICD-10-CM | POA: Diagnosis not present

## 2023-04-13 DIAGNOSIS — S24101A Unspecified injury at T1 level of thoracic spinal cord, initial encounter: Secondary | ICD-10-CM | POA: Diagnosis not present

## 2023-04-13 DIAGNOSIS — Z880 Allergy status to penicillin: Secondary | ICD-10-CM

## 2023-04-13 DIAGNOSIS — Z510 Encounter for antineoplastic radiation therapy: Secondary | ICD-10-CM | POA: Diagnosis present

## 2023-04-13 DIAGNOSIS — M25512 Pain in left shoulder: Secondary | ICD-10-CM | POA: Diagnosis present

## 2023-04-13 LAB — GLUCOSE, CAPILLARY
Glucose-Capillary: 91 mg/dL (ref 70–99)
Glucose-Capillary: 136 mg/dL — ABNORMAL HIGH (ref 70–99)

## 2023-04-13 MED ORDER — MAGNESIUM OXIDE -MG SUPPLEMENT 400 (240 MG) MG PO TABS
400.0000 mg | ORAL_TABLET | Freq: Two times a day (BID) | ORAL | Status: DC
  Administered 2023-04-13 – 2023-04-30 (×34): 400 mg via ORAL
  Filled 2023-04-13 (×34): qty 1

## 2023-04-13 MED ORDER — DEXAMETHASONE SODIUM PHOSPHATE 10 MG/ML IJ SOLN: 10.0000 mg | INTRAMUSCULAR | 0 refills | Status: DC

## 2023-04-13 MED ORDER — OXYCODONE HCL 5 MG PO TABS
10.0000 mg | ORAL_TABLET | ORAL | Status: DC | PRN
  Administered 2023-04-13 – 2023-04-14 (×2): 10 mg via ORAL
  Filled 2023-04-13 (×2): qty 2

## 2023-04-13 MED ORDER — CYCLOBENZAPRINE HCL 5 MG PO TABS
10.0000 mg | ORAL_TABLET | Freq: Three times a day (TID) | ORAL | Status: DC | PRN
  Administered 2023-04-14 – 2023-04-26 (×3): 10 mg via ORAL
  Filled 2023-04-13 (×4): qty 2

## 2023-04-13 MED ORDER — PANTOPRAZOLE SODIUM 40 MG PO TBEC
40.0000 mg | DELAYED_RELEASE_TABLET | Freq: Two times a day (BID) | ORAL | Status: DC
  Administered 2023-04-14 – 2023-04-30 (×33): 40 mg via ORAL
  Filled 2023-04-13 (×35): qty 1

## 2023-04-13 MED ORDER — VITAMIN B-12 1000 MCG PO TABS
1000.0000 ug | ORAL_TABLET | Freq: Every day | ORAL | Status: DC
  Administered 2023-04-14 – 2023-04-30 (×17): 1000 ug via ORAL
  Filled 2023-04-13 (×17): qty 1

## 2023-04-13 MED ORDER — GUAIFENESIN-DM 100-10 MG/5ML PO SYRP: 10.0000 mL | ORAL_SOLUTION | Freq: Four times a day (QID) | ORAL | Status: DC | PRN

## 2023-04-13 MED ORDER — GABAPENTIN 300 MG PO CAPS
300.0000 mg | ORAL_CAPSULE | Freq: Three times a day (TID) | ORAL | Status: DC
  Administered 2023-04-13 – 2023-04-19 (×17): 300 mg via ORAL
  Filled 2023-04-13 (×17): qty 1

## 2023-04-13 MED ORDER — ONDANSETRON HCL 4 MG PO TABS: 4.0000 mg | ORAL_TABLET | Freq: Four times a day (QID) | ORAL | Status: DC | PRN

## 2023-04-13 MED ORDER — ENOXAPARIN SODIUM 40 MG/0.4ML IJ SOSY
40.0000 mg | PREFILLED_SYRINGE | Freq: Two times a day (BID) | INTRAMUSCULAR | Status: DC
  Administered 2023-04-14 – 2023-04-30 (×33): 40 mg via SUBCUTANEOUS
  Filled 2023-04-13 (×34): qty 0.4

## 2023-04-13 MED ORDER — ALUM & MAG HYDROXIDE-SIMETH 200-200-20 MG/5ML PO SUSP: 30.0000 mL | ORAL | Status: DC | PRN

## 2023-04-13 MED ORDER — ONDANSETRON HCL 4 MG/2ML IJ SOLN: 4.0000 mg | Freq: Four times a day (QID) | INTRAMUSCULAR | Status: DC | PRN

## 2023-04-13 MED ORDER — FENTANYL 12 MCG/HR TD PT72: 1.0000 | MEDICATED_PATCH | TRANSDERMAL | Status: DC

## 2023-04-13 MED ORDER — LACTULOSE 10 GM/15ML PO SOLN: 20.0000 g | Freq: Two times a day (BID) | ORAL | 0 refills | Status: DC | PRN

## 2023-04-13 MED ORDER — ENZALUTAMIDE 80 MG PO TABS
160.0000 mg | ORAL_TABLET | Freq: Every day | ORAL | Status: DC
  Administered 2023-04-14 – 2023-04-30 (×17): 160 mg via ORAL
  Filled 2023-04-13 (×17): qty 2

## 2023-04-13 MED ORDER — ESCITALOPRAM OXALATE 10 MG PO TABS
30.0000 mg | ORAL_TABLET | Freq: Every day | ORAL | Status: DC
  Administered 2023-04-14 – 2023-04-30 (×17): 30 mg via ORAL
  Filled 2023-04-13 (×17): qty 3

## 2023-04-13 MED ORDER — PANTOPRAZOLE SODIUM 40 MG PO TBEC: 40.0000 mg | DELAYED_RELEASE_TABLET | Freq: Two times a day (BID) | ORAL | 0 refills | Status: DC

## 2023-04-13 MED ORDER — SENNOSIDES-DOCUSATE SODIUM 8.6-50 MG PO TABS
1.0000 | ORAL_TABLET | Freq: Two times a day (BID) | ORAL | Status: DC
  Administered 2023-04-13 – 2023-04-30 (×30): 1 via ORAL
  Filled 2023-04-13 (×34): qty 1

## 2023-04-13 MED ORDER — TRAZODONE HCL 50 MG PO TABS
25.0000 mg | ORAL_TABLET | Freq: Every day | ORAL | Status: DC
  Administered 2023-04-13 – 2023-04-29 (×17): 25 mg via ORAL
  Filled 2023-04-13 (×17): qty 1

## 2023-04-13 MED ORDER — POLYETHYLENE GLYCOL 3350 17 G PO PACK
17.0000 g | PACK | Freq: Two times a day (BID) | ORAL | Status: DC
  Administered 2023-04-13 – 2023-04-24 (×13): 17 g via ORAL
  Filled 2023-04-13 (×30): qty 1

## 2023-04-13 MED ORDER — MAGNESIUM OXIDE -MG SUPPLEMENT 400 (240 MG) MG PO TABS: 400.0000 mg | ORAL_TABLET | Freq: Two times a day (BID) | ORAL | 0 refills | Status: DC

## 2023-04-13 MED ORDER — DEXAMETHASONE 4 MG PO TABS
4.0000 mg | ORAL_TABLET | Freq: Four times a day (QID) | ORAL | Status: DC
  Administered 2023-04-14 – 2023-04-30 (×66): 4 mg via ORAL
  Filled 2023-04-13 (×68): qty 1

## 2023-04-13 MED ORDER — ACETAMINOPHEN 325 MG PO TABS
325.0000 mg | ORAL_TABLET | ORAL | Status: DC | PRN
  Administered 2023-04-14: 650 mg via ORAL
  Administered 2023-04-19 – 2023-04-21 (×2): 325 mg via ORAL
  Administered 2023-04-21 – 2023-04-30 (×13): 650 mg via ORAL
  Filled 2023-04-13 (×19): qty 2

## 2023-04-13 MED ORDER — IBUPROFEN 400 MG PO TABS
400.0000 mg | ORAL_TABLET | Freq: Four times a day (QID) | ORAL | Status: DC | PRN
  Administered 2023-04-14 – 2023-04-19 (×5): 400 mg via ORAL
  Filled 2023-04-13 (×7): qty 1

## 2023-04-13 MED ORDER — MELATONIN 3 MG PO TABS
3.0000 mg | ORAL_TABLET | Freq: Every evening | ORAL | Status: DC | PRN
  Administered 2023-04-25: 3 mg via ORAL
  Filled 2023-04-13 (×2): qty 1

## 2023-04-13 MED ORDER — ENOXAPARIN SODIUM 40 MG/0.4ML IJ SOSY
40.0000 mg | PREFILLED_SYRINGE | Freq: Two times a day (BID) | INTRAMUSCULAR | Status: DC
  Administered 2023-04-13: 40 mg via SUBCUTANEOUS
  Filled 2023-04-13: qty 0.4

## 2023-04-13 MED ORDER — ALPRAZOLAM 0.25 MG PO TABS
0.2500 mg | ORAL_TABLET | Freq: Two times a day (BID) | ORAL | Status: DC | PRN
  Administered 2023-04-13 – 2023-04-30 (×6): 0.25 mg via ORAL
  Filled 2023-04-13 (×7): qty 1

## 2023-04-13 MED ORDER — QUETIAPINE FUMARATE 50 MG PO TABS
200.0000 mg | ORAL_TABLET | Freq: Every day | ORAL | Status: DC
  Administered 2023-04-13 – 2023-04-29 (×17): 200 mg via ORAL
  Filled 2023-04-13 (×17): qty 4

## 2023-04-13 MED ORDER — LIDOCAINE 5 % EX PTCH
1.0000 | MEDICATED_PATCH | CUTANEOUS | Status: DC
  Filled 2023-04-13 (×7): qty 1

## 2023-04-13 MED ORDER — DEXAMETHASONE 4 MG PO TABS
4.0000 mg | ORAL_TABLET | Freq: Four times a day (QID) | ORAL | Status: DC
  Administered 2023-04-13: 4 mg via ORAL
  Filled 2023-04-13: qty 1

## 2023-04-13 NOTE — Progress Notes (Signed)
Inpatient Rehabilitation Admission Medication Review by a Pharmacist  A complete drug regimen review was completed for this patient to identify any potential clinically significant medication issues.  High Risk Drug Classes Is patient taking? Indication by Medication  Antipsychotic Yes Seroquel- anxiety/depression   Anticoagulant Yes Lovenox- DVT ppx   Antibiotic No   Opioid Yes Fentanyl patch- RN confirmed patient does not currently have patch applied   Antiplatelet No   Hypoglycemics/insulin No   Vasoactive Medication No   Chemotherapy Yes, Oral Chemotherapy Xtandi- prostate cancer   Other Yes Alprazolam PRN- anxiety  Vit B12, mag ox- supplement  Flexeril PRN- spasms Lexapro- anxiety  Gabapentin, Lidocaine patch- neuropathic pain  IBU PRN/APAP PRN- pain, fever   Melatonin PRN, trazodone PRN- sleep  Pantoprazole BID- reflux   Zofran PRN- nausea  Dexamethasone- inflammation       Type of Medication Issue Identified Description of Issue Recommendation(s)  Drug Interaction(s) (clinically significant)     Duplicate Therapy     Allergy     No Medication Administration End Date     Incorrect Dose     Additional Drug Therapy Needed     Significant med changes from prior encounter (inform family/care partners about these prior to discharge).    Other       Clinically significant medication issues were identified that warrant physician communication and completion of prescribed/recommended actions by midnight of the next day:  No  Name of provider notified for urgent issues identified:   Provider Method of Notification:     Pharmacist comments:   Time spent performing this drug regimen review (minutes):  20   Jani Gravel, PharmD Clinical Pharmacist  04/13/2023 6:41 PM

## 2023-04-13 NOTE — Discharge Instructions (Signed)
Advised to follow-up with neurosurgery as scheduled. Patient is being discharged to acute inpatient rehab in the hospital.   Patient will continue follow-up with radiation oncology.

## 2023-04-13 NOTE — H&P (Signed)
CC: Functional deficits secondary to T spine metastasis    HPI:  Nathan Berg is a 67 year old male with a history of prostate cancer who had undergone outpatient workup with [facility] on 03/25/2023 with MRI T-spine which showed concern for new metastatic disease involving thoracic spine with concern for cord compression.  He was then referred to the ER. MRI thoracic spine repeated on admission and evaluated by neurosurgery, started on steroids. He was admitted to hospital for further evaluation and treatment. On 9/26 he underwent decompressive laminectomy by Dr. Wynetta Emery. Remains on Decadron. Has history of depression, anxiety, GERD. He had CT simulation done for radiation treatment in the future. He is tolerating his diet. I have messaged NS service in regards to converting Decadron to oral, length of therapy and clearance for pharmacologic DVT prophylaxis.  He requires multiple medications to control his pain.reports pain feels like a band around his chest.  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  He continues to have the patient requires inpatient medicine and rehabilitation evaluations and services for ongoing functional deficits and decreased mobility.   He does not want a stimulant laxative tonight, but does feel as though he has had incomplete evacuation yesterday. Will add Miralax BID to the scheduled Senokot-S.   Review of Systems  Constitutional:  Negative for chills and fever.  HENT:  Negative for congestion.   Eyes:  Negative for double vision.  Respiratory:  Negative for shortness of breath.   Cardiovascular:  Negative for chest pain.  Gastrointestinal:  Positive for constipation. Negative for abdominal pain, nausea and vomiting.  Genitourinary:  Positive for urgency. Negative for dysuria.  Musculoskeletal:  Positive for back pain. Negative for joint pain.  Skin:  Negative for rash.  Neurological:  Negative for dizziness, sensory change and focal weakness.        Past  Medical History:  Diagnosis Date   Anxiety     Arthritis     Cancer (HCC)      Prostate cancer   Depression     GERD (gastroesophageal reflux disease)     H/O seasonal allergies     History of kidney stones      x1   Prostate cancer [facility])               Past Surgical History:  Procedure Laterality Date   CHEST TUBE INSERTION   07/16/2019    trauma    COLONOSCOPY       HERNIA REPAIR Bilateral      20 yrs ago   LAMINECTOMY N/A 03/31/2023    Procedure: THORACIC LAMINECTOMY FOR TUMOR;  Surgeon: Donalee Citrin, MD;  Location: [facility] OR;  Service: Neurosurgery;  Laterality: N/A;   LYMPHADENECTOMY Bilateral 09/19/2013    Procedure: LYMPHADENECTOMY  PELVIC LYMPH NODE DISSECTION;  Surgeon: Valetta Fuller, MD;  Location: [facility] ORS;  Service: Urology;  Laterality: Bilateral;   PROSTATE BIOPSY       ROBOT ASSISTED LAPAROSCOPIC RADICAL PROSTATECTOMY N/A 09/19/2013    Procedure: ROBOTIC ASSISTED LAPAROSCOPIC RADICAL PROSTATECTOMY;  Surgeon: Valetta Fuller, MD;  Location: [facility] ORS;  Service: Urology;  Laterality: N/A;   TONSILLECTOMY        child   VARICOCELE EXCISION                 Family History  Problem Relation Age of Onset   Prostate cancer Father     Breast cancer Neg Hx     Colon cancer Neg Hx  Pancreatic cancer Neg Hx          Social History:  reports that he has never smoked. He has never used smokeless tobacco. He reports current alcohol use. He reports that he does not use drugs. Allergies:  Allergies       Allergies  Allergen Reactions   Codeine Nausea And Vomiting   Penicillin G        Other reaction(s): stomach upset            Medications Prior to Admission  Medication Sig Dispense Refill   acetaminophen (TYLENOL) 500 MG tablet Take 2 tablets (1,000 mg total) by mouth 4 (four) times daily. (Patient taking differently: Take 1,000 mg by mouth as needed for moderate pain.) 120 tablet 3   ALPRAZolam (XANAX) 0.5 MG tablet Take 0.5-1 mg by mouth 2 (two) times daily as needed  for anxiety.       docusate sodium (COLACE) 100 MG capsule Take 1 capsule (100 mg total) by mouth 2 (two) times daily. 60 capsule 2   enzalutamide (XTANDI) 80 MG tablet Take 2 tablets (160 mg total) by mouth daily. 60 tablet 0   escitalopram (LEXAPRO) 20 MG tablet Take 30 mg by mouth daily.       gabapentin (NEURONTIN) 300 MG capsule Take 2 in the morning, 1 in the afternoon 1 at nighttime 120 capsule 3   ibuprofen (ADVIL) 200 MG tablet Take 800 mg by mouth as needed for mild pain or moderate pain.       lidocaine (LIDODERM) 5 % Place 1 patch onto the skin daily. Remove & Discard patch within 12 hours or as directed by MD 30 patch 0   QUEtiapine (SEROQUEL) 100 MG tablet Take 200 mg by mouth at bedtime.       traZODone (DESYREL) 100 MG tablet Take 25-50 mg by mouth at bedtime. Patient takes 1/4 of a tablet, will take another 1/4 if needed.       mirtazapine (REMERON) 7.5 MG tablet Take 7.5 mg by mouth at bedtime. (Patient not taking: Reported on 04/07/2023)                  Home: Home Living Family/patient expects to be discharged to:: Private residence Living Arrangements: Spouse/significant other Available Help at Discharge: Family, Available 24 hours/day Type of Home: House Home Access: Stairs to enter [employer] of Steps: 3 Entrance Stairs-Rails: Left, Right Home Layout: One level Bathroom Shower/Tub: Health visitor: Standard Bathroom Accessibility: Yes Home Equipment: Agricultural consultant (2 wheels), Shower seat - built in, [place] - single point, Grab bars - tub/shower Additional Comments: may have access to a wheelchair  Lives With: Spouse   Functional History: Prior Function Prior Level of Function : Independent/Modified Independent Mobility Comments: independent but was using a walker for a few days once his legs started feeling weak, otherwise no AD ADLs Comments: up until saturday pt was independent and driving   Functional Status:  Mobility: Bed  Mobility Overal bed mobility: Needs Assistance Bed Mobility: Sidelying to Sit Rolling: Modified independent (Device/Increase time) Sidelying to sit: Supervision Sit to supine: Modified independent (Device/Increase time) Sit to sidelying: Supervision General bed mobility comments: up in chair on arrival Transfers Overall transfer level: Needs assistance Equipment used: Rolling walker (2 wheels), None Transfers: Sit to/from Stand Sit to Stand: Min assist General transfer comment: CGA from recliner to RW, min A with serial sit<>stand without AD support Ambulation/Gait Ambulation/Gait assistance: Min Chemical engineer (Feet): 136 Feet Assistive  device: Rolling walker (2 wheels) Gait Pattern/deviations: Decreased dorsiflexion - right, Decreased stride length, Scissoring, Step-through pattern, Narrow base of support, Ataxic General Gait Details: cues for posture, step width and control, bil knee flexion throughout with pt buckling x3, cues for shorter step length as pt leaving feet behind Gait velocity: decreased Gait velocity interpretation: <1.8 ft/sec, indicate of risk for recurrent falls   ADL: ADL Overall ADL's : Needs assistance/impaired Eating/Feeding: Independent, Sitting Grooming: Wash/dry face, Oral care, Set up, Brushing hair Grooming Details (indicate cue type and reason): knee buckling, reliant on unilateral UE support Upper Body Dressing : Minimal assistance, Standing Upper Body Dressing Details (indicate cue type and reason): donning gown on backside Lower Body Dressing: Minimal assistance, Sitting/lateral leans Lower Body Dressing Details (indicate cue type and reason): figure 4 for donning/doffing shoes Toilet Transfer: Minimal assistance, Ambulation, Regular Toilet, Rolling walker (2 wheels) Toilet Transfer Details (indicate cue type and reason): simulated via functional mobility Toileting- Clothing Manipulation and Hygiene: Minimal assistance, Sit to/from  stand Functional mobility during ADLs: Minimal assistance, Rolling walker (2 wheels) General ADL Comments: HEAVY reliance on RW due to RLE buckle   Cognition: Cognition Overall Cognitive Status: Impaired/Different from baseline Orientation Level: Oriented X4 Cognition Arousal: Alert Behavior During Therapy: WFL for tasks assessed/performed Overall Cognitive Status: Impaired/Different from baseline Area of Impairment: Safety/judgement Memory: Decreased recall of precautions Safety/Judgement: Decreased awareness of deficits Problem Solving: Difficulty sequencing, Requires verbal cues General Comments: recalls 2/3 back prec, states his thinking has decreased since chemo, does sudoku but has difficutly with them now   Physical Exam: Blood pressure 98/70, pulse 82, temperature 98 F (36.7 C), resp. rate 16, height 5\' 10"  (1.778 m), weight 89.4 kg, SpO2 100%.   General: No apparent distress HEENT: Head is normocephalic, atraumatic, PERRLA, EOMI, sclera anicteric, oral mucosa pink and moist, dentition intact Neck: Supple without JVD or lymphadenopathy Heart: Reg rate and rhythm. + murmur Chest: CTA bilaterally without wheezes, rales, or rhonchi; no distress Abdomen: Soft, non-tender, mild-distended, bowel sounds positive. Extremities: No clubbing, cyanosis, or edema. Pulses are 2+ Psych: Pt's affect is appropriate. Pt is cooperative Skin:  Posterior C spine incision with with steri strips intact Neuro:  Alert and oriented x 3, follows commands, CN 2-12 intact, no speech or language deficits noted, good insight and awareness RUE: 5/5 Deltoid, 5/5 Biceps, 5/5 Triceps, 5/5 Wrist Ext, 5/5 Grip LUE: 4/5 Deltoid, 5/5 Biceps, 5/5 Triceps, 5/5 Wrist Ext, 5/5 Grip RLE: HF 5/5, KE 5/5, ADF 5/5, APF 5/5 LLE: HF 5/5, KE 5/5,  ADF 5/5, APF 5/5 Sensory exam normal for light touch and pain in all 4 limbs. No limb ataxia or cerebellar signs. No abnormal tone appreciated.  No ankle  clonus Musculoskeletal: L shoulder pain and crepitus with PROM, no significant L shoulder tenderness   .    Lab Results Last 48 Hours        Results for orders placed or performed during the hospital encounter of 03/28/23 (from the past 48 hour(s))  Glucose, capillary     Status: Abnormal    Collection Time: 04/11/23  5:20 PM  Result Value Ref Range    Glucose-Capillary 109 (H) 70 - 99 mg/dL      Comment: Glucose reference range applies only to samples taken after fasting for at least 8 hours.  Glucose, capillary     Status: Abnormal    Collection Time: 04/12/23  9:56 PM  Result Value Ref Range    Glucose-Capillary 124 (H) 70 - 99  mg/dL      Comment: Glucose reference range applies only to samples taken after fasting for at least 8 hours.  Glucose, capillary     Status: None    Collection Time: 04/13/23  8:55 AM  Result Value Ref Range    Glucose-Capillary 91 70 - 99 mg/dL      Comment: Glucose reference range applies only to samples taken after fasting for at least 8 hours.    Comment 1 Notify RN      Comment 2 Document in Chart         Imaging Results (Last 48 hours)  MR THORACIC SPINE W WO CONTRAST   Result Date: 04/11/2023 CLINICAL DATA:  Initial evaluation for metastatic disease evaluation. SRS protocol. EXAM: MRI THORACIC WITHOUT AND WITH CONTRAST TECHNIQUE: Multiplanar and multiecho pulse sequences of the thoracic spine were obtained without and with intravenous contrast. CONTRAST:  10mL GADAVIST GADOBUTROL 1 MMOL/ML IV SOLN COMPARISON:  Prior study from 03/30/2023. FINDINGS: Alignment:  A limited SRS protocol MRI was performed. Vertebral bodies normally aligned with preservation of the normal thoracic kyphosis. No listhesis. Vertebrae: Osseous metastases involving the T3 and T4 vertebral bodies are seen. There is complete replacement of the T3 and T4 vertebral bodies themselves, with involvement of the left posterior elements. Associated pathologic compression fracture of T3  with severe height loss and vertebral plana formation and 5 mm bony retropulsion. More mild compression deformity of T4 with up to 20% height loss without retropulsion. Epidural extension of tumor at T2 through T4, most pronounced within the ventral epidural space. Patient is status post posterior decompression at T2-3 and T3-4. Residual severe stenosis at the level of T3 due to bony retropulsion and epidural tumor. Thecal sac measures 6 mm in AP diameter at its most narrow point (series 5, image 16). More mild stenosis noted at the levels of T2 and T4. Examination is to be utilized for treatment planning purposes. Additional osseous metastases are seen involving the T2 vertebral body without associated pathologic fracture or extraosseous extension. Additional osseous metastasis noted involving the C6 vertebral body on counter sequence (series 2, image 9). Few additional scattered small osseous metastases noted distally within the thoracic spine involving the T5, T8, and T10 vertebral bodies. No other pathologic compression fracture. No other epidural or extraosseous extension of tumor. Cord: Focal kinking of the upper thoracic cord at the level of T3 due to compression fracture and stenosis at this level (series 3, image 12). No visible cord signal changes. Paraspinal and other soft tissues: Postoperative changes noted within the upper thoracic posterior paraspinous soft tissues. No worrisome collections. 1 cm sebaceous cyst noted within the subcutaneous fat of the upper thoracic spine. Layering left pleural effusion partially visualized. Disc levels: Underlying mild for age spondylosis and multilevel facet arthrosis. No other significant spinal stenosis. Moderate left foraminal narrowing at T10-11. IMPRESSION: 1. Osseous metastases involving the T3 and T4 vertebral bodies. Associated pathologic compression fractures as above, severe at the T3 level. Epidural extension of tumor at T2 through T4 with sequelae of  prior posterior decompression at T2-3 and T3-4. Residual severe stenosis at the level of T3 due to bony retropulsion and epidural tumor. More mild stenosis at the levels of T2 and T4. Examination is to be utilized for treatment planning purposes. 2. Additional scattered osseous metastases involving the C6, T2, T5, T8, and T10 vertebral bodies. No other pathologic fracture or extraosseous extension of tumor. 3. Layering left pleural effusion. Electronically Signed   By: Sharlet Salina  Phill Myron M.D.   On: 04/11/2023 21:58           Blood pressure 98/70, pulse 82, temperature 98 F (36.7 C), resp. rate 16, height 5\' 10"  (1.778 m), weight 89.4 kg, SpO2 100%.   Medical Problem List and Plan: 1. 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              -Plan for radiation treatments week of 10/14             -patient may shower             -ELOS/Goals: 10-12, PT/OT Supervision              -Admit to CIR   2.  Antithrombotics: -DVT/anticoagulation:  Mechanical:  Antiembolism stockings, knee (TED hose) Bilateral lower extremities. Awaiting call back from NS regarding initiating pharmacologic DVT prophylaxis             -antiplatelet therapy: none   3. Pain Management: Tylenol as needed             -continue Flexeril 10 mg TID prn             -continue Duragesic patch 12/mcg/hr q 72 hours -continue gabapentin 300 mg TID             -continue Lidoderm              -continue oxycodone 10 mg q 4 hrs prn             -continue ibuprofen 400 mg q 6 hrs prn   4. Mood/Behavior/Sleep: LCSW to evaluate and provide emotional support             -History of GAD             -continue Lexapro 30 mg daily             -continue Seroquel 200 mg q HS             -continue trazodone 25 mg q HS             -continue Xanax 0.25 mg BID prn             -continue melatonin 3 mg q HS prn             -continue trazodone  25 mg q HS prn               5. Neuropsych/cognition: This patient is capable of making decisions on his own behalf.   6. Skin/Wound Care: Routine skin care checks             -monitor incision>>keep the Steri-Strips dry during showers per Dr. Wynetta Emery   7. Fluids/Electrolytes/Nutrition: Routine Is and Os and follow-up chemistries             -continue Mag-ox 400 mg BID             -continue B12 1000 mcg daily             -Hypokalemia/Hyponatremia- Recheck labs tomorrow   8: Hypertension: controlled, monitor TID and prn   9: GERD: continue Protonix   10: History of prostate cancer with mets to spine s/p decompressive laminectomy T3 and evacuation of epidural tumor Dr. Wynetta Emery 09/26             -continue Diana Eves             -  continue Decadron             -no brace needed; no twisting, arching, bending             -follow-up with Dr. Wynetta Emery, Dr. Mosetta Putt and Dr. Kathrynn Running   11: Anemia: multifactorial             -follow-up CBC           Milinda Antis, PA-C 04/13/2023   I have personally performed a face to face diagnostic evaluation of this patient and formulated the key components of the plan.  Additionally, I have personally reviewed laboratory data, imaging studies, as well as relevant notes and concur with the physician assistant's documentation above.   The patient's status has not changed from the original H&P.  Any changes in documentation from the acute care chart have been noted above.   Fanny Dance, MD, [place]

## 2023-04-13 NOTE — Progress Notes (Signed)
PMR Admission Coordinator Pre-Admission Assessment   Patient: Nathan Berg is an 67 y.o., male MRN: [id number] DOB: 05-26-1956 Height: 5\' 10"  (177.8 cm) Weight: 79.7 kg   Insurance Information HMO:     PPO:      PCP:      IPA:      80/20:  no      OTHER:  PRIMARY: Medicare AB      Policy#: [id number]    Subscriber:  Phone#: Verified online    Fax#:  Pre-Cert#:       Employer:  Benefits:  Phone #:      Name:  Eff. Date: Parts A  and B effective 01/03/2019 Deduct: $1632      Out of Pocket Max:  None      Life Max: N/A  CIR: 100%      SNF: 100 days Outpatient: 80%     Co-Pay: 20% Home Health: 100%      Co-Pay: none DME: 80%     Co-Pay: 20% Providers: patient's choice  SECONDARY: [employer]      Policy#: [id number]      Phone#:    Financial Counselor:       Phone#:    The "Data Collection Information Summary" for patients in Inpatient Rehabilitation Facilities with attached "Privacy Act Statement-Health Care Records" was provided and verbally reviewed with: Patient   Emergency Contact Information Contact Information       Name Relation Home Work Mobile    Nathan Berg Spouse [phone]   [phone]         Other Contacts   None on File        Current Medical History  Patient Admitting Diagnosis: Mets to Spine s/p decompressive laminectomy History of Present Illness:   Nathan Berg is a 67 y.o. male with medical history significant for metastatic prostate cancer to bone, GAD, who was  admitted to [facility] on 03/28/2023 with concern regarding T3, T4 spinal cord compression after presenting from home to [facility] ED complaining of low back pain.  PMH includes: arthritis, metastatic prostate cancer (with hx of chemo and radiation), hernia repair, prostatectomy. MRI was performed on Friday, 03/25/2023, the read was not available until today, at which time the patient was contacted with results that included concern for new metastatic disease to T3, T4, with associated  concern for spinal cord compression at this level. Pt. Underwent S/P decompressive laminectomy T3 into T3 with partial facetectomy and evacuation of epidural tumor on 03/31/23. Per medical oncology, pt. To continue oral Xtandi  and Decadron. Pt. Seen by Radiation oncology and they recommend stereotactic radiation to begin 10/15. He was seen Post-op by PT/OT and they recommend CIR to assist return to PLOF.      Patient's medical record from [facility]  has been reviewed by the rehabilitation admission coordinator and physician.   Past Medical History      Past Medical History:  Diagnosis Date   Anxiety     Arthritis     Cancer (HCC)      Prostate cancer   Depression     GERD (gastroesophageal reflux disease)     H/O seasonal allergies     History of kidney stones      x1   Prostate cancer (HCC)            Has the patient had major surgery during 100 days prior to admission? Yes   Family History   family history includes Prostate  cancer in his father.   Current Medications  Current Medications    Current Facility-Administered Medications:    0.9 %  sodium chloride infusion, 250 mL, Intravenous, Continuous, Donalee Citrin, MD   acetaminophen (TYLENOL) tablet 650 mg, 650 mg, Oral, QID, 650 mg at 04/03/23 0904 **OR** [DISCONTINUED] acetaminophen (TYLENOL) suppository 650 mg, 650 mg, Rectal, Q4H PRN, Donalee Citrin, MD   ALPRAZolam Prudy Feeler) tablet 0.25 mg, 0.25 mg, Oral, BID PRN, Donalee Citrin, MD, 0.25 mg at 04/02/23 2234   alum & mag hydroxide-simeth (MAALOX/MYLANTA) 200-200-20 MG/5ML suspension 30 mL, 30 mL, Oral, Q6H PRN, Donalee Citrin, MD   Chlorhexidine Gluconate Cloth 2 % PADS 6 each, 6 each, Topical, Daily, Lewie Chamber, MD, 6 each at 04/03/23 0906   cyclobenzaprine (FLEXERIL) tablet 10 mg, 10 mg, Oral, TID PRN, Donalee Citrin, MD, 10 mg at 04/03/23 0865   dexamethasone (DECADRON) injection 10 mg, 10 mg, Intravenous, Q24H, Donalee Citrin, MD, 10 mg at 04/03/23 7846    enzalutamide (XTANDI) tablet 160 mg, 160 mg, Oral, Daily, Donalee Citrin, MD, 160 mg at 04/03/23 1024   escitalopram (LEXAPRO) tablet 20 mg, 20 mg, Oral, Daily, Donalee Citrin, MD, 20 mg at 04/03/23 0904   gabapentin (NEURONTIN) capsule 100 mg, 100 mg, Oral, TID, Donalee Citrin, MD, 100 mg at 04/03/23 0904   HYDROmorphone (DILAUDID) injection 1 mg, 1 mg, Intravenous, Q3H PRN, Donalee Citrin, MD, 1 mg at 04/03/23 0901   lactulose (CHRONULAC) 10 GM/15ML solution 20 g, 20 g, Oral, BID PRN, Lewie Chamber, MD   lidocaine (LIDODERM) 5 % 1 patch, 1 patch, Transdermal, Q24H, Donalee Citrin, MD, 1 patch at 04/02/23 2239   melatonin tablet 3 mg, 3 mg, Oral, QHS PRN, Donalee Citrin, MD, 3 mg at 04/02/23 2235   menthol-cetylpyridinium (CEPACOL) lozenge 3 mg, 1 lozenge, Oral, PRN **OR** phenol (CHLORASEPTIC) mouth spray 1 spray, 1 spray, Mouth/Throat, PRN, Donalee Citrin, MD   mupirocin ointment (BACTROBAN) 2 % 1 Application, 1 Application, Nasal, BID, Lewie Chamber, MD, 1 Application at 04/03/23 0904   naloxone [facility]) injection 0.4 mg, 0.4 mg, Intravenous, PRN, Donalee Citrin, MD   ondansetron [facility]) injection 4 mg, 4 mg, Intravenous, Q6H PRN, Donalee Citrin, MD   ondansetron [facility]) tablet 4 mg, 4 mg, Oral, Q6H PRN **OR** ondansetron (ZOFRAN) injection 4 mg, 4 mg, Intravenous, Q6H PRN, Donalee Citrin, MD   oxyCODONE (Oxy IR/ROXICODONE) immediate release tablet 5 mg, 5 mg, Oral, Q4H PRN, Lewie Chamber, MD, 5 mg at 04/03/23 1019   pantoprazole (PROTONIX) EC tablet 40 mg, 40 mg, Oral, QHS, Lewie Chamber, MD, 40 mg at 04/02/23 2236   polyethylene glycol (MIRALAX / GLYCOLAX) packet 17 g, 17 g, Oral, Daily, Lewie Chamber, MD, 17 g at 04/03/23 0902   senna-docusate (Senokot-S) tablet 1 tablet, 1 tablet, Oral, BID, Lewie Chamber, MD, 1 tablet at 04/03/23 0904   sodium chloride flush (NS) 0.9 % injection 3 mL, 3 mL, Intravenous, Q12H, Donalee Citrin, MD, 3 mL at 04/03/23 0905   sodium chloride flush (NS) 0.9 % injection 3 mL, 3 mL, Intravenous, PRN,  Donalee Citrin, MD     Patients Current Diet:  Diet Order                  Diet regular Room service appropriate? Yes; Fluid consistency: Thin  Diet effective now                         Precautions / Restrictions Precautions Precautions: Fall, Back Precaution Booklet Issued:  No Precaution Comments: no brace needed; reviewed precautions Restrictions Weight Bearing Restrictions: No    Has the patient had 2 or more falls or a fall with injury in the past year? Yes   Prior Activity Level Community (5-7x/wk): Pt. active in the community PTA   Prior Functional Level Self Care: Did the patient need help bathing, dressing, using the toilet or eating? Independent   Indoor Mobility: Did the patient need assistance with walking from room to room (with or without device)? Independent   Stairs: Did the patient need assistance with internal or external stairs (with or without device)? Independent   Functional Cognition: Did the patient need help planning regular tasks such as shopping or remembering to take medications? Independent   Patient Information Are you of Hispanic, Latino/a,or Spanish origin?: A. No, not of Hispanic, Latino/a, or Spanish origin What is your race?: A. White Do you need or want an interpreter to communicate with a doctor or health care staff?: 0. No   Patient's Response To:  Health Literacy and Transportation Is the patient able to respond to health literacy and transportation needs?: Yes Health Literacy - How often do you need to have someone help you when you read instructions, pamphlets, or other written material from your doctor or pharmacy?: Never In the past 12 months, has lack of transportation kept you from medical appointments or from getting medications?: Yes In the past 12 months, has lack of transportation kept you from meetings, work, or from getting things needed for daily living?: Yes   Home Assistive Devices / Equipment Home Assistive  Devices/Equipment: Medical laboratory scientific officer (specify quad or straight) Home Equipment: Agricultural consultant (2 wheels), Shower seat - built in, [place] - single point, Grab bars - tub/shower   Prior Device Use: Indicate devices/aids used by the patient prior to current illness, exacerbation or injury? None of the above   Current Functional Level Cognition   Overall Cognitive Status: Within Functional Limits for tasks assessed Orientation Level: Oriented X4    Extremity Assessment (includes Sensation/Coordination)   Upper Extremity Assessment: Defer to OT evaluation  Lower Extremity Assessment: RLE deficits/detail RLE Deficits / Details: incoordination noted, ataxic movements; MMT scores of 3+ hip flexion, 4 knee extension, 4- ankle dorsiflexion; denied numbness/tingling currently, may have had some prior to surgery RLE Sensation: WNL RLE Coordination: decreased gross motor, decreased fine motor     ADLs   Overall ADL's : Needs assistance/impaired Grooming: Minimal assistance, Standing Grooming Details (indicate cue type and reason): relies on external support Upper Body Dressing : Set up, Sitting Lower Body Dressing: Minimal assistance, Sit to/from stand Toilet Transfer: Minimal assistance, +2 for safety/equipment, Ambulation, Rolling walker (2 wheels) Toileting- Clothing Manipulation and Hygiene: Minimal assistance, Sit to/from stand Functional mobility during ADLs: Minimal assistance, Moderate assistance, +2 for safety/equipment, Rolling walker (2 wheels)     Mobility   General bed mobility comments: OOB in recliner     Transfers   Overall transfer level: Needs assistance Equipment used: Rolling walker (2 wheels) Transfers: Sit to/from Stand Sit to Stand: Min assist General transfer comment: cueing for hand placement and posture, min assist to fully power up and steady     Ambulation / Gait / Stairs / Wheelchair Mobility   Ambulation/Gait Ambulation/Gait assistance: Min assist, +2 safety/equipment  (chair follow) Gait Distance (Feet): 15 Feet (+ 75 ft with x2 seated rest breaks) Assistive device: Rolling walker (2 wheels) Gait Pattern/deviations: Step-through pattern, Decreased step length - left, Decreased step length - right, Decreased stride  length, Trunk flexed, Ataxic, Narrow base of support, Knees buckling, Scissoring General Gait Details: pt with ataxic steps (R>L) with intermittent narrow BOS and scissoring needing cues to widen BOS. Pt with RLE instability and buckling that progresses both at hip and knee with fatigue. assist at R knee at times for improved stability. pt reports mild increase in pain Gait velocity: reduced Gait velocity interpretation: <1.31 ft/sec, indicative of household ambulator     Posture / Balance Balance Overall balance assessment: Needs assistance Sitting-balance support: No upper extremity supported, Feet supported Sitting balance-Leahy Scale: Fair Standing balance support: Bilateral upper extremity supported, During functional activity, No upper extremity supported Standing balance-Leahy Scale: Poor Standing balance comment: relies on external support     Special needs/care consideration Skin surgical incision and Special service needs Rad[facility]  10/15, 10/17, 10/21, 10/23 and 10/25    Previous Home Environment (from acute therapy documentation) Living Arrangements: Spouse/significant other  Lives With: Spouse Available Help at Discharge: Family, Available 24 hours/day Type of Home: House Home Layout: One level Home Access: Stairs to enter Entrance Stairs-Rails: Left, Right Entrance Stairs-Number of Steps: 3 Bathroom Shower/Tub: Health visitor: Standard Bathroom Accessibility: Yes How Accessible: Accessible via walker Home Care Services: No Additional Comments: may have access to a wheelchair   Discharge Living Setting Plans for Discharge Living Setting: Patient's home Type of Home at Discharge:  House Discharge Home Layout: One level Discharge Home Access: Level entry Discharge Bathroom Shower/Tub: Tub/shower unit Discharge Bathroom Toilet: Standard Discharge Bathroom Accessibility: Yes How Accessible: Accessible via walker, Accessible via wheelchair Does the patient have any problems obtaining your medications?: No   Social/Family/Support Systems Patient Roles: Spouse Contact Information: [phone] Anticipated Caregiver: Javaughn Gerren Anticipated Caregiver's Contact Information: 24/7 Ability/Limitations of Caregiver: Wife can provide 24/7 supervision to min A Caregiver Availability: 24/7 Discharge Plan Discussed with Primary Caregiver: Yes Is Caregiver In Agreement with Plan?: Yes Does Caregiver/Family have Issues with Lodging/Transportation while Pt is in Rehab?: No   Goals Patient/Family Goal for Rehab: PT/OT Min A to Supervisoin Expected length of stay: 12-14 days Pt/Family Agrees to Admission and willing to participate: Yes Program Orientation Provided & Reviewed with Pt/Caregiver Including Roles  & Responsibilities: Yes   Decrease burden of Care through IP rehab admission: not anticipated              Possible need for SNF placement upon discharge: not anticipated    Patient Condition: I have reviewed medical records from [facility], spoken with CM, and patient and spouse. I met with patient at the bedside for inpatient rehabilitation assessment.  Patient will benefit from ongoing PT and OT, can actively participate in 3 hours of therapy a day 5 days of the week, and can make measurable gains during the admission.  Patient will also benefit from the coordinated team approach during an Inpatient Acute Rehabilitation admission.  The patient will receive intensive therapy as well as Rehabilitation physician, nursing, social worker, and care management interventions.  Due to safety, skin/wound care, disease management, medication administration, pain  management, and patient education the patient requires 24 hour a day rehabilitation nursing.  The patient is currently min A to CGA with mobility and basic ADLs.  Discharge setting and therapy post discharge at home with home health is anticipated.  Patient has agreed to participate in the Acute Inpatient Rehabilitation Program and will admit today.   Preadmission Screen Completed By:  Jeronimo Greaves, 04/03/2023 10:50 AM ______________________________________________________________________  Discussed status with Dr. Natale Lay on 04/13/23  at 930 and received approval for admission today.   Admission Coordinator:  Jeronimo Greaves, CCC-SLP, time 1155/Date 04/13/23    Assessment/Plan: Diagnosis: Metastatic prostate cancer metastasis to T spine with compression fracture and spinal stenosis s/p laminectomy Does the need for close, 24 hr/day Medical supervision in concert with the patient's rehab needs make it unreasonable for this patient to be served in a less intensive setting? Yes Co-Morbidities requiring supervision/potential complications: hypokalemia, Hyponatremia, anziety  Due to bladder management, bowel management, safety, skin/wound care, disease management, medication administration, pain management, and patient education, does the patient require 24 hr/day rehab nursing? Yes Does the patient require coordinated care of a physician, rehab nurse, PT, OT, and SLP to address physical and functional deficits in the context of the above medical diagnosis(es)? Yes Addressing deficits in the following areas: balance, endurance, locomotion, strength, transferring, bowel/bladder control, bathing, dressing, feeding, grooming, toileting, and psychosocial support Can the patient actively participate in an intensive therapy program of at least 3 hrs of therapy 5 days a week? Yes The potential for patient to make measurable gains while on inpatient rehab is excellent Anticipated functional outcomes upon  discharge from inpatient rehab: supervision and min assist PT, supervision and min assist OT, n/a SLP Estimated rehab length of stay to reach the above functional goals is: 12-14 Anticipated discharge destination: Home 10. Overall Rehab/Functional Prognosis: good     MD Signature: Fanny Dance

## 2023-04-13 NOTE — Plan of Care (Signed)
  Problem: Consults Goal: RH SPINAL CORD INJURY PATIENT EDUCATION Description:  See Patient Education module for education specifics.  Outcome: Progressing Goal: Skin Care Protocol Initiated - if Braden Score 18 or less Description: If consults are not indicated, leave blank or document N/A Outcome: Progressing Goal: Nutrition Consult-if indicated Outcome: Progressing Goal: Diabetes Guidelines if Diabetic/Glucose > 140 Description: If diabetic or lab glucose is > 140 mg/dl - Initiate Diabetes/Hyperglycemia Guidelines & Document Interventions  Outcome: Progressing   Problem: SCI BOWEL ELIMINATION Goal: RH STG MANAGE BOWEL WITH ASSISTANCE Description: STG Manage Bowel with Assistance. Outcome: Progressing Goal: RH STG SCI MANAGE BOWEL WITH MEDICATION WITH ASSISTANCE Description: STG SCI Manage bowel with medication with assistance. Outcome: Progressing Goal: RH STG MANAGE BOWEL W/EQUIPMENT W/ASSISTANCE Description: STG Manage Bowel With Equipment With Assistance Outcome: Progressing Goal: RH OTHER STG BOWEL ELIMINATION GOALS W/ASSIST Description: Other STG Bowel Elimination Goals With Assistance. Outcome: Progressing   Problem: SCI BLADDER ELIMINATION Goal: RH STG MANAGE BLADDER WITH ASSISTANCE Description: STG Manage Bladder With Assistance Outcome: Progressing Goal: RH STG MANAGE BLADDER WITH MEDICATION WITH ASSISTANCE Description: STG Manage Bladder With Medication With Assistance. Outcome: Progressing Goal: RH OTHER STG BLADDER ELIMINATION GOALS W/ASSIST Description: Other STG Bladder Elimination Goals With Assistance Outcome: Progressing   Problem: RH SKIN INTEGRITY Goal: RH STG SKIN FREE OF INFECTION/BREAKDOWN Outcome: Progressing Goal: RH STG MAINTAIN SKIN INTEGRITY WITH ASSISTANCE Description: STG Maintain Skin Integrity With Assistance. Outcome: Progressing   Problem: RH SAFETY Goal: RH STG ADHERE TO SAFETY PRECAUTIONS W/ASSISTANCE/DEVICE Description: STG Adhere  to Safety Precautions With Assistance/Device. Outcome: Progressing   Problem: RH KNOWLEDGE DEFICIT SCI Goal: RH STG INCREASE KNOWLEDGE OF SELF CARE AFTER SCI Outcome: Progressing

## 2023-04-13 NOTE — Plan of Care (Signed)

## 2023-04-13 NOTE — Progress Notes (Addendum)
Inpatient Rehab Admissions Coordinator:   I have a CIR bed for this pt. Today. RN may call report to [phone].   Pt. To admit to CIR to participate in an intensive rehab program for 7-10 days with the goal of reaching supervision-mod I level and discharging home with assistance from his wife.    Megan Salon, MS, CCC-SLP Rehab Admissions Coordinator  [phone] (celll) [phone] (office)

## 2023-04-13 NOTE — Progress Notes (Signed)
Report called for [facility] [place] Room 09

## 2023-04-13 NOTE — Discharge Summary (Signed)
Physician Discharge Summary  Nathan Berg IHK:[id number] DOB: 07-23-1955 DOA: 03/28/2023  PCP: Nathan Mood, MD  Admit date: 03/28/2023  Discharge date: 04/13/2023  Admitted From: Home.  Disposition:  Acute inpatient rehab.  Recommendations for Outpatient Follow-up:  Follow up with PCP in 1-2 weeks. Please obtain BMP/CBC in one week. Advised to follow-up with Neurosurgery as scheduled. Patient is being discharged to acute inpatient rehab in the hospital.   Patient will continue follow-up with radiation oncology.  Home Health:None Equipment/Devices:None  Discharge Condition: Stable CODE STATUS:DNR Diet recommendation: Heart Healthy  Brief [facility] Course:: Nathan Berg is a 66 yo male with past medical history of metastatic prostate cancer to the bone, generalized anxiety disorder initially presented to the hospital with worsening back pain. He had undergone outpatient workup with [facility] on 03/25/2023 with MRI T-spine which showed concern for new metastatic disease involving thoracic spine with concern for cord compression.  He was then referred to the ER. MRI thoracic spine repeated on admission and evaluated by neurosurgery, started on steroids.  Patient was then admitted to hospital for further evaluation and treatment.   During hospitalization, patient underwent decompressive laminectomy  by neurosurgery and is currently awaiting for rehabilitation placement.  Patient is on multiple regimen for pain medication and still complains of pain.  Had CT simulation done for radiation treatment in the future.  Patient is cleared from neurosurgery to be discharged to acute inpatient rehab.  Insurance authorization approved.  Patient being discharged to rehab.   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   Multiple additional smaller osseous metastases scattered elsewhere throughout the thoracic spine. Neurosurgery was consulted and patient underwent decompressive laminectomy with partial facetectomy and evacuation of epidural tumor on 03/31/23.  Initially received Decadron.  Currently on fentanyl patch, as needed IV Dilaudid, oral oxycodone, Flexeril, Advil and Neurontin for pain management..  Neurontin was initiated recently by neurosurgery.  JP drain removed 04/03/2023.  Continue PT OT.  Recommendation is CIR at this time.  [facility] radiation oncology recommends 14 days for incisions to heal before any radiation.  Patient was presented spine oncology conference at 7 am Monday 10/7 for discussion of his imaging and pathology.  Oncology to put forth plan on him.  Current problem is adequate pain management.  Hopefully pain gets better after initiation of radiation treatment. Patient is being discharged to acute inpatient rehab.   Malignant neoplasm of prostate (HCC) With bony metastasis.  S/p robotic assisted laparoscopic radical prostatectomy and bilateral lymph node dissection in March 2015, pT2N0.  Patient follows up with [facility] oncology as outpatient. transitioning back to [facility].   GAD (generalized anxiety disorder) - Continue Xanax and Lexapro   Hypokalemia.  Improved after replacement.  Latest potassium of of 4.1.   Mild hyponatremia.  Improved.  Latest sodium of 135.  No labs from today.   BMP in AM   Deconditioning, debility.  Seen by physical therapy and recommend CIR.    Discharge Instructions  Discharge Instructions     Call MD for:  difficulty breathing, headache or  visual disturbances   Complete by: As directed    Call MD for:  persistant dizziness or light-headedness   Complete by: As directed    Call MD for:  persistant nausea and vomiting   Complete by: As directed    Diet - low sodium heart healthy   Complete by: As directed    Diet Carb Modified   Complete by: As directed    Discharge instructions   Complete by: As directed    Advised to follow-up with primary care physician in 1 week. Advised to follow-up with neurosurgery as scheduled. Patient is being discharged to acute inpatient rehab in the hospital.   Patient will continue follow-up with radiation oncology.   Increase activity slowly   Complete by: As directed       Allergies as of 04/13/2023       Reactions   Codeine Nausea And Vomiting   Penicillin G    Other reaction(s): stomach upset        Medication List     STOP taking these medications    mirtazapine 7.5 MG tablet Commonly known as: REMERON       TAKE these medications    acetaminophen 500 MG tablet Commonly known as: TYLENOL Take 2 tablets (1,000 mg total) by mouth 4 (four) times daily. What changed:  when to take this reasons to take this   ALPRAZolam 0.5 MG tablet Commonly known as: XANAX Take 0.5-1 mg by mouth 2 (two) times daily as needed for anxiety.   dexamethasone 10 MG/ML injection Commonly known as: DECADRON Inject 1 mL (10 mg total) into the vein daily. Start taking on: April 14, 2023   docusate sodium 100 MG capsule Commonly known as: Colace Take 1 capsule (100 mg total) by mouth 2 (two) times daily.   escitalopram 20 MG tablet Commonly known as: LEXAPRO Take 30 mg by mouth daily.   gabapentin 300 MG capsule Commonly known as: Neurontin Take 2 in the morning, 1 in the afternoon 1 at nighttime   ibuprofen 200 MG tablet Commonly known as: ADVIL Take 800 mg by mouth as needed for mild pain or moderate pain.   lactulose 10 GM/15ML solution Commonly known as:  CHRONULAC Take 30 mLs (20 g total) by mouth 2 (two) times daily as needed for mild constipation, moderate constipation or severe constipation (use if inadequate response to Miralax).   lidocaine 5 % Commonly known as: LIDODERM Place 1 patch onto the skin daily. Remove & Discard patch within 12 hours or as directed by MD   magnesium oxide 400 (240 Mg) MG tablet Commonly known as: MAG-OX Take 1 tablet (400 mg total) by mouth 2 (two) times daily.   pantoprazole 40 MG tablet Commonly known as: PROTONIX Take 1 tablet (40 mg total) by mouth 2 (two) times daily before a meal for 15 days.   QUEtiapine 100 MG tablet Commonly known as: SEROQUEL Take 200 mg by mouth at bedtime.   traZODone 100 MG tablet Commonly known as: DESYREL Take 25-50 mg by mouth at bedtime. Patient takes 1/4 of a tablet, will take another 1/4 if needed.   Xtandi 80 MG tablet Generic drug: enzalutamide Take 2 tablets (160 mg total) by mouth daily.        Follow-up Information     Nathan Mood, MD Follow up in 1 week(s).   Specialties: Hematology, Oncology Contact information: [address] [place] [address] [phone]  Allergies  Allergen Reactions   Codeine Nausea And Vomiting   Penicillin G     Other reaction(s): stomach upset    Consultations: Neurosurgery   Procedures/Studies: MR THORACIC SPINE W WO CONTRAST  Result Date: 04/11/2023 CLINICAL DATA:  Initial evaluation for metastatic disease evaluation. SRS protocol. EXAM: MRI THORACIC WITHOUT AND WITH CONTRAST TECHNIQUE: Multiplanar and multiecho pulse sequences of the thoracic spine were obtained without and with intravenous contrast. CONTRAST:  10mL GADAVIST GADOBUTROL 1 MMOL/ML IV SOLN COMPARISON:  Prior study from 03/30/2023. FINDINGS: Alignment:  A limited SRS protocol MRI was performed. Vertebral bodies normally aligned with preservation of the normal thoracic kyphosis. No listhesis. Vertebrae: Osseous  metastases involving the T3 and T4 vertebral bodies are seen. There is complete replacement of the T3 and T4 vertebral bodies themselves, with involvement of the left posterior elements. Associated pathologic compression fracture of T3 with severe height loss and vertebral plana formation and 5 mm bony retropulsion. More mild compression deformity of T4 with up to 20% height loss without retropulsion. Epidural extension of tumor at T2 through T4, most pronounced within the ventral epidural space. Patient is status post posterior decompression at T2-3 and T3-4. Residual severe stenosis at the level of T3 due to bony retropulsion and epidural tumor. Thecal sac measures 6 mm in AP diameter at its most narrow point (series 5, image 16). More mild stenosis noted at the levels of T2 and T4. Examination is to be utilized for treatment planning purposes. Additional osseous metastases are seen involving the T2 vertebral body without associated pathologic fracture or extraosseous extension. Additional osseous metastasis noted involving the C6 vertebral body on counter sequence (series 2, image 9). Few additional scattered small osseous metastases noted distally within the thoracic spine involving the T5, T8, and T10 vertebral bodies. No other pathologic compression fracture. No other epidural or extraosseous extension of tumor. Cord: Focal kinking of the upper thoracic cord at the level of T3 due to compression fracture and stenosis at this level (series 3, image 12). No visible cord signal changes. Paraspinal and other soft tissues: Postoperative changes noted within the upper thoracic posterior paraspinous soft tissues. No worrisome collections. 1 cm sebaceous cyst noted within the subcutaneous fat of the upper thoracic spine. Layering left pleural effusion partially visualized. Disc levels: Underlying mild for age spondylosis and multilevel facet arthrosis. No other significant spinal stenosis. Moderate left foraminal  narrowing at T10-11. IMPRESSION: 1. Osseous metastases involving the T3 and T4 vertebral bodies. Associated pathologic compression fractures as above, severe at the T3 level. Epidural extension of tumor at T2 through T4 with sequelae of prior posterior decompression at T2-3 and T3-4. Residual severe stenosis at the level of T3 due to bony retropulsion and epidural tumor. More mild stenosis at the levels of T2 and T4. Examination is to be utilized for treatment planning purposes. 2. Additional scattered osseous metastases involving the C6, T2, T5, T8, and T10 vertebral bodies. No other pathologic fracture or extraosseous extension of tumor. 3. Layering left pleural effusion. Electronically Signed   By: Rise Mu M.D.   On: 04/11/2023 21:58   DG Thoracic Spine 1 View  Result Date: 03/31/2023 CLINICAL DATA:  Thoracic laminectomy for tumor. EXAM: OPERATIVE THORACIC SPINE 1 VIEW(S) COMPARISON:  03/30/2023. FINDINGS: A single fluoroscopic image was obtained intraoperatively for thoracic laminectomy. Total fluoroscopy time is 2.9 seconds. Dose: 0.5 mGy. Please see operative report for additional information. IMPRESSION: Intraoperative utilization of fluoroscopy. Electronically Signed   By: Charlestine Night.D.  On: 03/31/2023 20:01   DG C-Arm 1-60 Min-No Report  Result Date: 03/31/2023 Fluoroscopy was utilized by the requesting physician.  No radiographic interpretation.   MR OUTSIDE FILMS SPINE  Result Date: 03/30/2023 This examination belongs to an outside facility and is stored here for comparison purposes only.  Contact the originating outside institution for any associated report or interpretation.  CT THORACIC SPINE WO CONTRAST  Result Date: 03/30/2023 CLINICAL DATA:  Thoracic compression fracture.  Known malignancy. EXAM: CT THORACIC SPINE WITHOUT CONTRAST TECHNIQUE: Multidetector CT images of the thoracic were obtained using the standard protocol without intravenous contrast. RADIATION  DOSE REDUCTION: This exam was performed according to the departmental dose-optimization program which includes automated exposure control, adjustment of the mA and/or kV according to patient size and/or use of iterative reconstruction technique. COMPARISON:  Thoracic spine MRI 1 day prior FINDINGS: Alignment: Normal. Vertebrae: Again seen is marked compression deformity of the T3 vertebral body with near-complete loss of vertebral body height and 4 mm bony retropulsion consistent with pathologic fracture in the setting of osseous metastatic disease. There is also a nondisplaced fracture through the right T3 lamina and transverse process extending to the inferior articulating process (5-23, 7-37). Also again seen is the more mild pathologic compression fracture of the T4 vertebral body with approximately 40% loss of vertebral body height anteriorly and no bony retropulsion. There is a nondisplaced fracture of the right T4 transverse process (5-31, 7-32). The other vertebral body heights are preserved. The bones are diffusely demineralized. Known additional metastatic lesions as well as epidural tumor at T3 and T4 are better seen on the thoracic spine MRI from 1 day prior. Paraspinal and other soft tissues: Nodular scarring in the medial right lung apex is unchanged. A probable sebaceous cyst is noted in the subcutaneous fat just to the left of midline at the T1 level. A large hiatal hernia is unchanged. Disc levels: There is overall mild underlying degenerative change throughout the thoracic spine. Known severe spinal canal stenosis at T3 is better seen on the MRI. IMPRESSION: 1. Pathologic compression fractures of the T3 and T4 vertebral bodies are similar to the prior MRI, worse at T3 where there is essentially complete loss of vertebral body height and bony retropulsion. Known severe spinal canal stenosis due to the retropulsion and epidural tumor is better seen on the prior MRI. 2. Additional nondisplaced  fractures of the right T3 lamina, transverse process, and inferior articulating process, and right T4 transverse process. Electronically Signed   By: Lesia Hausen M.D.   On: 03/30/2023 13:50   MR THORACIC SPINE W WO CONTRAST  Result Date: 03/30/2023 CLINICAL DATA:  Initial evaluation for musculoskeletal neoplasm, history of prostate cancer. EXAM: MRI THORACIC WITHOUT AND WITH CONTRAST TECHNIQUE: Multiplanar and multiecho pulse sequences of the thoracic spine were obtained without and with intravenous contrast. CONTRAST:  9mL GADAVIST GADOBUTROL 1 MMOL/ML IV SOLN COMPARISON:  Prior study from 12/09/2022. FINDINGS: Alignment: Vertebral bodies normally aligned with preservation of the normal thoracic kyphosis. Trace degenerative retrolisthesis of L1 on L2 noted. Vertebrae: Osseous metastasis involving the T3 vertebral body is seen. Associated pathologic compression fracture with vertebral plana formation and up to 6 mm of bony retropulsion. Additional osseous metastasis involves the entirety of the T4 vertebral body. Associated 40% height loss without significant bony retropulsion. Tumor extends to involve the posterior elements bilaterally. Epidural extension of tumor at these levels with tumor within the ventral epidural space, most pronounced at T3 where there is resultant severe spinal  stenosis and cord compression (series 10, image 17). Secondary cord flattening with suspected early/mild cord signal changes (series 4, image 13). Thecal sac measures approximately 4-5 mm in AP diameter. No more than mild stenosis inferiorly at the level of T4. Moderate to severe bilateral foraminal narrowing noted at T2-3 and T3-4 as well. Multiple additional smaller osseous metastases seen scattered elsewhere throughout the thoracic spine. For reference purposes, the largest additional lesion involves the inferior aspect of T2 and measures 1.5 cm (series 5, image 12). No other significant extra osseous or epidural extension of  tumor. No other pathologic compression fracture. Few additional scattered osseous metastases noted within the cervical spine on counter sequence. No visible extra osseous or epidural extension of tumor within the cervical spine itself. Cord: Cord impingement with suspected subtle early cord signal changes at the level of T3 as above. Otherwise normal signal and morphology. Paraspinal and other soft tissues: Paraspinous soft tissues demonstrate no acute finding. Small layering left pleural effusion noted. Disc levels: Underlying mild for age multilevel thoracic spondylosis elsewhere throughout the thoracic spine. Scattered superimposed multilevel facet disease. No other significant spinal stenosis. Mild multilevel foraminal narrowing noted within the lower thoracic spine at T10-11 bilaterally and T12-L1 on the left. IMPRESSION: 1. Osseous metastases involving the T3 and T4 vertebral bodies with associated pathologic compression fractures as above. Epidural extension of tumor at these levels with resultant severe spinal stenosis and cord compression at the level of T3. Probable subtle early/mild cord signal changes at this level. 2. Multiple additional smaller osseous metastases scattered elsewhere throughout the thoracic spine. No other significant extra osseous or epidural extension of tumor. 3. Underlying mild for age thoracic spondylosis without other high-grade spinal stenosis. Mild multilevel foraminal narrowing within the lower thoracic spine as above. 4. Small layering left pleural effusion. Electronically Signed   By: Rise Mu M.D.   On: 03/30/2023 04:25    Subjective: Patient was seen and examined at bedside.  Overnight events noted.   Patient reports doing much better and wants to be discharged.  Patient being discharged to acute inpatient rehab today.  Discharge Exam: Vitals:   04/12/23 1533 04/13/23 0853  BP: 117/76 98/70  Pulse: 79 82  Resp: 18 16  Temp: 98 F (36.7 C) 98 F  (36.7 C)  SpO2: 95% 100%   Vitals:   04/12/23 0828 04/12/23 1533 04/13/23 0500 04/13/23 0853  BP: 124/77 117/76  98/70  Pulse: 87 79  82  Resp:  18  16  Temp:  98 F (36.7 C)  98 F (36.7 C)  TempSrc:  Oral    SpO2: 98% 95%  100%  Weight:   89.4 kg   Height:        General: Pt is alert, awake, not in acute distress Cardiovascular: RRR, S1/S2 +, no rubs, no gallops Respiratory: CTA bilaterally, no wheezing, no rhonchi Abdominal: Soft, NT, ND, bowel sounds + Extremities: no edema, no cyanosis    The results of significant diagnostics from this hospitalization (including imaging, microbiology, ancillary and laboratory) are listed below for reference.     Microbiology: No results found for this or any previous visit (from the past 240 hour(s)).   Labs: BNP (last 3 results) No results for input(s): "BNP" in the last 8760 hours. Basic Metabolic Panel: Recent Labs  Lab 04/07/23 0805 04/08/23 0547  NA 132* 135  K 3.3* 4.1  CL 101 102  CO2 23 26  GLUCOSE 132* 95  BUN 24* 24*  CREATININE 0.98  0.81  CALCIUM 8.1* 8.3*  MG 1.8 1.9   Liver Function Tests: No results for input(s): "AST", "ALT", "ALKPHOS", "BILITOT", "PROT", "ALBUMIN" in the last 168 hours. No results for input(s): "LIPASE", "AMYLASE" in the last 168 hours. No results for input(s): "AMMONIA" in the last 168 hours. CBC: Recent Labs  Lab 04/07/23 0805 04/08/23 0547  WBC 6.3 4.2  HGB 9.9* 9.0*  HCT 29.9* 27.2*  MCV 100.0 98.9  PLT 299 296   Cardiac Enzymes: No results for input(s): "CKTOTAL", "CKMB", "CKMBINDEX", "TROPONINI" in the last 168 hours. BNP: Invalid input(s): "POCBNP" CBG: Recent Labs  Lab 04/10/23 0850 04/10/23 1636 04/11/23 1720 04/12/23 2156 04/13/23 0855  GLUCAP 97 118* 109* 124* 91   D-Dimer No results for input(s): "DDIMER" in the last 72 hours. Hgb A1c No results for input(s): "HGBA1C" in the last 72 hours. Lipid Profile No results for input(s): "CHOL", "HDL",  "LDLCALC", "TRIG", "CHOLHDL", "LDLDIRECT" in the last 72 hours. Thyroid function studies No results for input(s): "TSH", "T4TOTAL", "T3FREE", "THYROIDAB" in the last 72 hours.  Invalid input(s): "FREET3" Anemia work up No results for input(s): "VITAMINB12", "FOLATE", "FERRITIN", "TIBC", "IRON", "RETICCTPCT" in the last 72 hours. Urinalysis    Component Value Date/Time   COLORURINE YELLOW 03/29/2023 0055   APPEARANCEUR CLEAR 03/29/2023 0055   LABSPEC 1.012 03/29/2023 0055   PHURINE 7.0 03/29/2023 0055   GLUCOSEU NEGATIVE 03/29/2023 0055   HGBUR NEGATIVE 03/29/2023 0055   BILIRUBINUR NEGATIVE 03/29/2023 0055   KETONESUR 5 (A) 03/29/2023 0055   PROTEINUR NEGATIVE 03/29/2023 0055   NITRITE NEGATIVE 03/29/2023 0055   LEUKOCYTESUR NEGATIVE 03/29/2023 0055   Sepsis Labs Recent Labs  Lab 04/07/23 0805 04/08/23 0547  WBC 6.3 4.2   Microbiology No results found for this or any previous visit (from the past 240 hour(s)).   Time coordinating discharge: Over 30 minutes  SIGNED:   Willeen Niece, MD  [facility] 04/13/2023, 1:43 PM Pager   If 7PM-7AM, please contact night-coverage

## 2023-04-13 NOTE — H&P (Signed)
Physical Medicine and Rehabilitation Admission H&P   CC: Functional deficits secondary to   HPI:  Nathan Berg is a 67 year old male with a history of prostate cancer who had undergone outpatient workup with [facility] on 03/25/2023 with MRI T-spine which showed concern for new metastatic disease involving thoracic spine with concern for cord compression.  He was then referred to the ER. MRI thoracic spine repeated on admission and evaluated by neurosurgery, started on steroids. He was admitted to hospital for further evaluation and treatment. On 9/26 he underwent decompressive laminectomy by Dr. Wynetta Emery. Remains on Decadron. Has history of depression, anxiety, GERD. He had CT simulation done for radiation treatment in the future. He is tolerating his diet. I have messaged NS service in regards to converting Decadron to oral, length of therapy and clearance for pharmacologic DVT prophylaxis.  He requires multiple medications to control his pain.reports pain feels like a band around his chest.  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  He continues to have the patient requires inpatient medicine and rehabilitation evaluations and services for ongoing functional deficits and decreased mobility.  He does not want a stimulant laxative tonight, but does feel as though he has had incomplete evacuation yesterday. Will add Miralax BID to the scheduled Senokot-S.  Review of Systems  Constitutional:  Negative for chills and fever.  HENT:  Negative for congestion.   Eyes:  Negative for double vision.  Respiratory:  Negative for shortness of breath.   Cardiovascular:  Negative for chest pain.  Gastrointestinal:  Positive for constipation. Negative for abdominal pain, nausea and vomiting.  Genitourinary:  Positive for urgency. Negative for dysuria.  Musculoskeletal:  Positive for back pain. Negative for joint pain.  Skin:  Negative for rash.  Neurological:  Negative for dizziness, sensory change and  focal weakness.   Past Medical History:  Diagnosis Date   Anxiety    Arthritis    Cancer (HCC)    Prostate cancer   Depression    GERD (gastroesophageal reflux disease)    H/O seasonal allergies    History of kidney stones    x1   Prostate cancer [facility])    Past Surgical History:  Procedure Laterality Date   CHEST TUBE INSERTION  07/16/2019   trauma    COLONOSCOPY     HERNIA REPAIR Bilateral    20 yrs ago   LAMINECTOMY N/A 03/31/2023   Procedure: THORACIC LAMINECTOMY FOR TUMOR;  Surgeon: Donalee Citrin, MD;  Location: [facility] OR;  Service: Neurosurgery;  Laterality: N/A;   LYMPHADENECTOMY Bilateral 09/19/2013   Procedure: LYMPHADENECTOMY  PELVIC LYMPH NODE DISSECTION;  Surgeon: Valetta Fuller, MD;  Location: [facility] ORS;  Service: Urology;  Laterality: Bilateral;   PROSTATE BIOPSY     ROBOT ASSISTED LAPAROSCOPIC RADICAL PROSTATECTOMY N/A 09/19/2013   Procedure: ROBOTIC ASSISTED LAPAROSCOPIC RADICAL PROSTATECTOMY;  Surgeon: Valetta Fuller, MD;  Location: [facility] ORS;  Service: Urology;  Laterality: N/A;   TONSILLECTOMY     child   VARICOCELE EXCISION     Family History  Problem Relation Age of Onset   Prostate cancer Father    Breast cancer Neg Hx    Colon cancer Neg Hx    Pancreatic cancer Neg Hx    Social History:  reports that he has never smoked. He has never used smokeless tobacco. He reports current alcohol use. He reports that he does not use drugs. Allergies:  Allergies  Allergen Reactions   Codeine Nausea And Vomiting   Penicillin G  Other reaction(s): stomach upset   Medications Prior to Admission  Medication Sig Dispense Refill   acetaminophen (TYLENOL) 500 MG tablet Take 2 tablets (1,000 mg total) by mouth 4 (four) times daily. (Patient taking differently: Take 1,000 mg by mouth as needed for moderate pain.) 120 tablet 3   ALPRAZolam (XANAX) 0.5 MG tablet Take 0.5-1 mg by mouth 2 (two) times daily as needed for anxiety.     docusate sodium (COLACE) 100 MG capsule Take 1  capsule (100 mg total) by mouth 2 (two) times daily. 60 capsule 2   enzalutamide (XTANDI) 80 MG tablet Take 2 tablets (160 mg total) by mouth daily. 60 tablet 0   escitalopram (LEXAPRO) 20 MG tablet Take 30 mg by mouth daily.     gabapentin (NEURONTIN) 300 MG capsule Take 2 in the morning, 1 in the afternoon 1 at nighttime 120 capsule 3   ibuprofen (ADVIL) 200 MG tablet Take 800 mg by mouth as needed for mild pain or moderate pain.     lidocaine (LIDODERM) 5 % Place 1 patch onto the skin daily. Remove & Discard patch within 12 hours or as directed by MD 30 patch 0   QUEtiapine (SEROQUEL) 100 MG tablet Take 200 mg by mouth at bedtime.     traZODone (DESYREL) 100 MG tablet Take 25-50 mg by mouth at bedtime. Patient takes 1/4 of a tablet, will take another 1/4 if needed.     mirtazapine (REMERON) 7.5 MG tablet Take 7.5 mg by mouth at bedtime. (Patient not taking: Reported on 04/07/2023)        Home: Home Living Family/patient expects to be discharged to:: Private residence Living Arrangements: Spouse/significant other Available Help at Discharge: Family, Available 24 hours/day Type of Home: House Home Access: Stairs to enter [employer] of Steps: 3 Entrance Stairs-Rails: Left, Right Home Layout: One level Bathroom Shower/Tub: Health visitor: Standard Bathroom Accessibility: Yes Home Equipment: Agricultural consultant (2 wheels), Shower seat - built in, [place] - single point, [facility] - tub/shower Additional Comments: may have access to a wheelchair  Lives With: Spouse   Functional History: Prior Function Prior Level of Function : Independent/Modified Independent Mobility Comments: independent but was using a walker for a few days once his legs started feeling weak, otherwise no AD ADLs Comments: up until saturday pt was independent and driving  Functional Status:  Mobility: Bed Mobility Overal bed mobility: Needs Assistance Bed Mobility: Sidelying to Sit Rolling:  Modified independent (Device/Increase time) Sidelying to sit: Supervision Sit to supine: Modified independent (Device/Increase time) Sit to sidelying: Supervision General bed mobility comments: up in chair on arrival Transfers Overall transfer level: Needs assistance Equipment used: Rolling walker (2 wheels), None Transfers: Sit to/from Stand Sit to Stand: Min assist General transfer comment: CGA from recliner to RW, min A with serial sit<>stand without AD support Ambulation/Gait Ambulation/Gait assistance: Min Chemical engineer (Feet): 136 Feet Assistive device: Rolling walker (2 wheels) Gait Pattern/deviations: Decreased dorsiflexion - right, Decreased stride length, Scissoring, Step-through pattern, Narrow base of support, Ataxic General Gait Details: cues for posture, step width and control, bil knee flexion throughout with pt buckling x3, cues for shorter step length as pt leaving feet behind Gait velocity: decreased Gait velocity interpretation: <1.8 ft/sec, indicate of risk for recurrent falls    ADL: ADL Overall ADL's : Needs assistance/impaired Eating/Feeding: Independent, Sitting Grooming: Wash/dry face, Oral care, Set up, Brushing hair Grooming Details (indicate cue type and reason): knee buckling, reliant on unilateral UE support Upper  Body Dressing : Minimal assistance, Standing Upper Body Dressing Details (indicate cue type and reason): donning gown on backside Lower Body Dressing: Minimal assistance, Sitting/lateral leans Lower Body Dressing Details (indicate cue type and reason): figure 4 for donning/doffing shoes Toilet Transfer: Minimal assistance, Ambulation, Regular Toilet, Rolling walker (2 wheels) Toilet Transfer Details (indicate cue type and reason): simulated via functional mobility Toileting- Clothing Manipulation and Hygiene: Minimal assistance, Sit to/from stand Functional mobility during ADLs: Minimal assistance, Rolling walker (2 wheels) General ADL  Comments: HEAVY reliance on RW due to RLE buckle  Cognition: Cognition Overall Cognitive Status: Impaired/Different from baseline Orientation Level: Oriented X4 Cognition Arousal: Alert Behavior During Therapy: WFL for tasks assessed/performed Overall Cognitive Status: Impaired/Different from baseline Area of Impairment: Safety/judgement Memory: Decreased recall of precautions Safety/Judgement: Decreased awareness of deficits Problem Solving: Difficulty sequencing, Requires verbal cues General Comments: recalls 2/3 back prec, states his thinking has decreased since chemo, does sudoku but has difficutly with them now  Physical Exam: Blood pressure 98/70, pulse 82, temperature 98 F (36.7 C), resp. rate 16, height 5\' 10"  (1.778 m), weight 89.4 kg, SpO2 100%.  General: No apparent distress HEENT: Head is normocephalic, atraumatic, PERRLA, EOMI, sclera anicteric, oral mucosa pink and moist, dentition intact Neck: Supple without JVD or lymphadenopathy Heart: Reg rate and rhythm. + murmur Chest: CTA bilaterally without wheezes, rales, or rhonchi; no distress Abdomen: Soft, non-tender, mild-distended, bowel sounds positive. Extremities: No clubbing, cyanosis, or edema. Pulses are 2+ Psych: Pt's affect is appropriate. Pt is cooperative Skin:  Posterior C spine incision with with steri strips intact Neuro:  Alert and oriented x 3, follows commands, CN 2-12 intact, no speech or language deficits noted, good insight and awareness RUE: 5/5 Deltoid, 5/5 Biceps, 5/5 Triceps, 5/5 Wrist Ext, 5/5 Grip LUE: 4/5 Deltoid, 5/5 Biceps, 5/5 Triceps, 5/5 Wrist Ext, 5/5 Grip RLE: HF 5/5, KE 5/5, ADF 5/5, APF 5/5 LLE: HF 5/5, KE 5/5,  ADF 5/5, APF 5/5 Sensory exam normal for light touch and pain in all 4 limbs. No limb ataxia or cerebellar signs. No abnormal tone appreciated.  No ankle clonus Musculoskeletal: L shoulder pain and crepitus with PROM, no significant L shoulder tenderness   .   Results for  orders placed or performed during the hospital encounter of 03/28/23 (from the past 48 hour(s))  Glucose, capillary     Status: Abnormal   Collection Time: 04/11/23  5:20 PM  Result Value Ref Range   Glucose-Capillary 109 (H) 70 - 99 mg/dL    Comment: Glucose reference range applies only to samples taken after fasting for at least 8 hours.  Glucose, capillary     Status: Abnormal   Collection Time: 04/12/23  9:56 PM  Result Value Ref Range   Glucose-Capillary 124 (H) 70 - 99 mg/dL    Comment: Glucose reference range applies only to samples taken after fasting for at least 8 hours.  Glucose, capillary     Status: None   Collection Time: 04/13/23  8:55 AM  Result Value Ref Range   Glucose-Capillary 91 70 - 99 mg/dL    Comment: Glucose reference range applies only to samples taken after fasting for at least 8 hours.   Comment 1 Notify RN    Comment 2 Document in Chart    MR THORACIC SPINE W WO CONTRAST  Result Date: 04/11/2023 CLINICAL DATA:  Initial evaluation for metastatic disease evaluation. SRS protocol. EXAM: MRI THORACIC WITHOUT AND WITH CONTRAST TECHNIQUE: Multiplanar and multiecho pulse sequences of the  thoracic spine were obtained without and with intravenous contrast. CONTRAST:  10mL GADAVIST GADOBUTROL 1 MMOL/ML IV SOLN COMPARISON:  Prior study from 03/30/2023. FINDINGS: Alignment:  A limited SRS protocol MRI was performed. Vertebral bodies normally aligned with preservation of the normal thoracic kyphosis. No listhesis. Vertebrae: Osseous metastases involving the T3 and T4 vertebral bodies are seen. There is complete replacement of the T3 and T4 vertebral bodies themselves, with involvement of the left posterior elements. Associated pathologic compression fracture of T3 with severe height loss and vertebral plana formation and 5 mm bony retropulsion. More mild compression deformity of T4 with up to 20% height loss without retropulsion. Epidural extension of tumor at T2 through T4, most  pronounced within the ventral epidural space. Patient is status post posterior decompression at T2-3 and T3-4. Residual severe stenosis at the level of T3 due to bony retropulsion and epidural tumor. Thecal sac measures 6 mm in AP diameter at its most narrow point (series 5, image 16). More mild stenosis noted at the levels of T2 and T4. Examination is to be utilized for treatment planning purposes. Additional osseous metastases are seen involving the T2 vertebral body without associated pathologic fracture or extraosseous extension. Additional osseous metastasis noted involving the C6 vertebral body on counter sequence (series 2, image 9). Few additional scattered small osseous metastases noted distally within the thoracic spine involving the T5, T8, and T10 vertebral bodies. No other pathologic compression fracture. No other epidural or extraosseous extension of tumor. Cord: Focal kinking of the upper thoracic cord at the level of T3 due to compression fracture and stenosis at this level (series 3, image 12). No visible cord signal changes. Paraspinal and other soft tissues: Postoperative changes noted within the upper thoracic posterior paraspinous soft tissues. No worrisome collections. 1 cm sebaceous cyst noted within the subcutaneous fat of the upper thoracic spine. Layering left pleural effusion partially visualized. Disc levels: Underlying mild for age spondylosis and multilevel facet arthrosis. No other significant spinal stenosis. Moderate left foraminal narrowing at T10-11. IMPRESSION: 1. Osseous metastases involving the T3 and T4 vertebral bodies. Associated pathologic compression fractures as above, severe at the T3 level. Epidural extension of tumor at T2 through T4 with sequelae of prior posterior decompression at T2-3 and T3-4. Residual severe stenosis at the level of T3 due to bony retropulsion and epidural tumor. More mild stenosis at the levels of T2 and T4. Examination is to be utilized for  treatment planning purposes. 2. Additional scattered osseous metastases involving the C6, T2, T5, T8, and T10 vertebral bodies. No other pathologic fracture or extraosseous extension of tumor. 3. Layering left pleural effusion. Electronically Signed   By: Rise Mu M.D.   On: 04/11/2023 21:58      Blood pressure 98/70, pulse 82, temperature 98 F (36.7 C), resp. rate 16, height 5\' 10"  (1.778 m), weight 89.4 kg, SpO2 100%.  Medical Problem List and Plan: 1. 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   -Plan for radiation treatments week of 10/14  -patient may shower  -ELOS/Goals: 10-12, PT/OT Supervision   -Admit to CIR  2.  Antithrombotics: -DVT/anticoagulation:  Mechanical:  Antiembolism stockings, knee (TED hose) Bilateral lower extremities. Awaiting call back from NS regarding initiating pharmacologic DVT prophylaxis  -antiplatelet therapy: none  3. Pain Management: Tylenol as needed  -continue Flexeril 10 mg TID prn  -continue Duragesic patch 12/mcg/hr q 72 hours -continue gabapentin 300 mg TID  -  continue Lidoderm   -continue oxycodone 10 mg q 4 hrs prn  -continue ibuprofen 400 mg q 6 hrs prn  4. Mood/Behavior/Sleep: LCSW to evaluate and provide emotional support  -History of GAD  -continue Lexapro 30 mg daily  -continue Seroquel 200 mg q HS  -continue trazodone 25 mg q HS  -continue Xanax 0.25 mg BID prn  -continue melatonin 3 mg q HS prn  -continue trazodone 25 mg q HS prn    5. Neuropsych/cognition: This patient is capable of making decisions on his own behalf.  6. Skin/Wound Care: Routine skin care checks  -monitor incision>>keep the Steri-Strips dry during showers per Dr. Wynetta Emery   7. Fluids/Electrolytes/Nutrition: Routine Is and Os and follow-up chemistries  -continue Mag-ox 400 mg BID  -continue B12 1000 mcg daily  -Hypokalemia/Hyponatremia- Recheck labs  tomorrow  8: Hypertension: controlled, monitor TID and prn  9: GERD: continue Protonix  10: History of prostate cancer with mets to spine s/p decompressive laminectomy T3 and evacuation of epidural tumor Dr. Wynetta Emery 09/26  -continue Xtandi  -continue Decadron  -no brace needed; no twisting, arching, bending  -follow-up with Dr. Wynetta Emery, Dr. Mosetta Putt and Dr. Kathrynn Running  11: Anemia: multifactorial  -follow-up CBC      Milinda Antis, PA-C 04/13/2023  I have personally performed a face to face diagnostic evaluation of this patient and formulated the key components of the plan.  Additionally, I have personally reviewed laboratory data, imaging studies, as well as relevant notes and concur with the physician assistant's documentation above.  The patient's status has not changed from the original H&P.  Any changes in documentation from the acute care chart have been noted above.  Fanny Dance, MD, [place]

## 2023-04-13 NOTE — Progress Notes (Signed)
Occupational Therapy Treatment Patient Details Name: Nathan Berg MRN: [id number] DOB: 1955/08/01 Today's Date: 04/13/2023   History of present illness 67 yo male adm to [facility] on 9/23 with LBP, gait instability and falls; and transferred to [facility]. Pt with T3/4 metastatic disease with associated spinal cord compression. 9/26 S/P decompressive laminectomy T3 and evacuation of epidural tumor. PMHx: arthritis, metastatic prostate CA, hernia repair, prostatectomy.   OT comments  Pt progressing towards goals this session, needing set up A to complete 3 standing ADLs at sink along with functional standing task with unilateral UE support, needing x2 rest breaks due to knee buckling/fatigue. Pt with good awareness of activity tolerance and identifies when needing to take a rest break. Pt presenting with impairments listed below, will follow acutely. Patient will benefit from intensive inpatient follow up therapy, >3 hours/day to maximize safety/ind with ADLs/functional mobility.       If plan is discharge home, recommend the following:  A lot of help with walking and/or transfers;A little help with bathing/dressing/bathroom;Assistance with cooking/housework;Assist for transportation;Help with stairs or ramp for entrance   Equipment Recommendations  Other (comment) (defer)    Recommendations for Other Services Rehab consult    Precautions / Restrictions Precautions Precautions: Fall;Back Precaution Booklet Issued: No Precaution Comments: no brace needed; reviewed precautions during functional mobility. Restrictions Weight Bearing Restrictions: No       Mobility Bed Mobility               General bed mobility comments: up in chair on arrival    Transfers Overall transfer level: Needs assistance Equipment used: Rolling walker (2 wheels), None Transfers: Sit to/from Stand Sit to Stand: Min assist                 Balance Overall balance assessment: Needs  assistance Sitting-balance support: No upper extremity supported, Feet supported Sitting balance-Leahy Scale: Good     Standing balance support: Single extremity supported, During functional activity Standing balance-Leahy Scale: Poor Standing balance comment: Uses at least single UE to safely stabilize.                           ADL either performed or assessed with clinical judgement   ADL Overall ADL's : Needs assistance/impaired     Grooming: Wash/dry face;Oral care;Set up;Brushing hair Grooming Details (indicate cue type and reason): knee buckling, reliant on unilateral UE support                 Toilet Transfer: Minimal assistance;Ambulation;Regular Toilet;Rolling walker (2 wheels)           Functional mobility during ADLs: Minimal assistance;Rolling walker (2 wheels)      Extremity/Trunk Assessment Upper Extremity Assessment Upper Extremity Assessment: Generalized weakness   Lower Extremity Assessment Lower Extremity Assessment: Defer to PT evaluation        Vision   Vision Assessment?: No apparent visual deficits   Perception Perception Perception: Within Functional Limits   Praxis Praxis Praxis: WFL    Cognition Arousal: Alert Behavior During Therapy: WFL for tasks assessed/performed Overall Cognitive Status: Impaired/Different from baseline Area of Impairment: Safety/judgement                     Memory: Decreased recall of precautions   Safety/Judgement: Decreased awareness of deficits   Problem Solving: Difficulty sequencing, Requires verbal cues          Exercises Other Exercises Other Exercises: dynamic standing balance task with  unilateral UE support    Shoulder Instructions       General Comments VSS    Pertinent Vitals/ Pain       Pain Assessment Pain Assessment: Faces Pain Score: 7  Faces Pain Scale: Hurts whole lot Pain Location: back and L side Pain Descriptors / Indicators: Discomfort Pain  Intervention(s): Limited activity within patient's tolerance, Monitored during session, Repositioned  Home Living                                          Prior Functioning/Environment              Frequency  Min 1X/week        Progress Toward Goals  OT Goals(current goals can now be found in the care plan section)  Progress towards OT goals: Progressing toward goals  Acute Rehab OT Goals Patient Stated Goal: none stated OT Goal Formulation: With patient Time For Goal Achievement: 04/15/23 Potential to Achieve Goals: Good ADL Goals Pt Will Perform Grooming: with modified independence;standing Pt Will Perform Lower Body Dressing: with modified independence;sit to/from stand Pt Will Transfer to Toilet: with modified independence;ambulating Pt Will Perform Toileting - Clothing Manipulation and hygiene: with modified independence;sit to/from stand Pt Will Perform Tub/Shower Transfer: Shower transfer;with modified independence;ambulating;shower seat;rolling walker;grab bars  Plan      Co-evaluation                 AM-PAC OT "6 Clicks" Daily Activity     Outcome Measure   Help from another person eating meals?: None Help from another person taking care of personal grooming?: A Little Help from another person toileting, which includes using toliet, bedpan, or urinal?: A Little Help from another person bathing (including washing, rinsing, drying)?: A Lot Help from another person to put on and taking off regular upper body clothing?: A Little Help from another person to put on and taking off regular lower body clothing?: A Lot 6 Click Score: 17    End of Session Equipment Utilized During Treatment: Gait belt;Rolling walker (2 wheels)  OT Visit Diagnosis: Other abnormalities of gait and mobility (R26.89);Pain Pain - Right/Left: Right   Activity Tolerance Patient tolerated treatment well   Patient Left in chair;with call bell/phone within  reach;with chair alarm set   Nurse Communication Mobility status        Time: 0865-7846 OT Time Calculation (min): 19 min  Charges: OT General Charges $OT Visit: 1 Visit OT Treatments $Self Care/Home Management : 8-22 mins  Carver Fila, OTD, OTR/L SecureChat Preferred Acute Rehab [phone]   Nathan Berg K Koonce 04/13/2023, 11:52 AM

## 2023-04-14 ENCOUNTER — Other Ambulatory Visit: Payer: Self-pay

## 2023-04-14 DIAGNOSIS — Z510 Encounter for antineoplastic radiation therapy: Secondary | ICD-10-CM | POA: Diagnosis not present

## 2023-04-14 DIAGNOSIS — G8222 Paraplegia, incomplete: Secondary | ICD-10-CM | POA: Diagnosis not present

## 2023-04-14 DIAGNOSIS — M4714 Other spondylosis with myelopathy, thoracic region: Principal | ICD-10-CM | POA: Insufficient documentation

## 2023-04-14 LAB — COMPREHENSIVE METABOLIC PANEL
ALT: 18 U/L (ref 0–44)
AST: 19 U/L (ref 15–41)
Albumin: 2.6 g/dL — ABNORMAL LOW (ref 3.5–5.0)
Alkaline Phosphatase: 78 U/L (ref 38–126)
Anion gap: 8 (ref 5–15)
BUN: 19 mg/dL (ref 8–23)
CO2: 28 mmol/L (ref 22–32)
Calcium: 8.8 mg/dL — ABNORMAL LOW (ref 8.9–10.3)
Chloride: 101 mmol/L (ref 98–111)
Creatinine, Ser: 0.83 mg/dL (ref 0.61–1.24)
GFR, Estimated: >60 mL/min (ref >60)
Glucose, Bld: 110 mg/dL — ABNORMAL HIGH (ref 70–99)
Potassium: 5 mmol/L (ref 3.5–5.1)
Sodium: 137 mmol/L (ref 135–145)
Total Bilirubin: 0.4 mg/dL (ref 0.3–1.2)
Total Protein: 5.4 g/dL — ABNORMAL LOW (ref 6.5–8.1)

## 2023-04-14 LAB — CBC WITH DIFFERENTIAL/PLATELET
Abs Immature Granulocytes: 0.03 10*3/uL (ref 0.00–0.07)
Basophils Absolute: 0 10*3/uL (ref 0.0–0.1)
Basophils Relative: 0 %
Eosinophils Absolute: 0 10*3/uL (ref 0.0–0.5)
Eosinophils Relative: 0 %
HCT: 30.3 % — ABNORMAL LOW (ref 39.0–52.0)
Hemoglobin: 9.9 g/dL — ABNORMAL LOW (ref 13.0–17.0)
Immature Granulocytes: 0 %
Lymphocytes Relative: 10 %
Lymphs Abs: 0.7 10*3/uL (ref 0.7–4.0)
MCH: 32.4 pg (ref 26.0–34.0)
MCHC: 32.7 g/dL (ref 30.0–36.0)
MCV: 99 fL (ref 80.0–100.0)
Monocytes Absolute: 0.3 10*3/uL (ref 0.1–1.0)
Monocytes Relative: 5 %
Neutro Abs: 6 10*3/uL (ref 1.7–7.7)
Neutrophils Relative %: 85 %
Platelets: 360 10*3/uL (ref 150–400)
RBC: 3.06 MIL/uL — ABNORMAL LOW (ref 4.22–5.81)
RDW: 12.1 % (ref 11.5–15.5)
WBC: 7 10*3/uL (ref 4.0–10.5)
nRBC: 0 % (ref 0.0–0.2)

## 2023-04-14 MED ORDER — OXYCODONE HCL 5 MG PO TABS
5.0000 mg | ORAL_TABLET | ORAL | Status: DC | PRN
  Administered 2023-04-14 – 2023-04-15 (×5): 5 mg via ORAL
  Filled 2023-04-14 (×5): qty 1

## 2023-04-14 MED ORDER — FENTANYL 25 MCG/HR TD PT72
1.0000 | MEDICATED_PATCH | TRANSDERMAL | Status: DC
  Administered 2023-04-14 – 2023-04-29 (×6): 1 via TRANSDERMAL
  Filled 2023-04-14 (×7): qty 1

## 2023-04-14 MED ORDER — SORBITOL 70 % SOLN
15.0000 mL | Freq: Once | Status: AC
  Administered 2023-04-14: 15 mL via ORAL
  Filled 2023-04-14: qty 30

## 2023-04-14 NOTE — Discharge Summary (Signed)
Physician Discharge Summary  Patient ID: Nathan Berg MRN: [id number] DOB/AGE: 1956-06-11 67 y.o.  Admit date: 04/13/2023 Discharge date: 04/30/2023  Discharge Diagnoses:  Principal Problem:   Zoster Active Problems:   Prostate cancer (HCC)   Compression fracture of T3 vertebra (HCC)   Anemia   Primary hypertension   Spinal stenosis, thoracic   Adjustment disorder History of GAD Varicella zoster infection Heart murmur  Discharged Condition: stable  Significant Diagnostic Studies: none  Labs:  Basic Metabolic Panel:    Latest Ref Rng & Units 04/25/2023    5:41 AM 04/18/2023    5:34 AM 04/14/2023    6:31 AM  BMP  Glucose 70 - 99 mg/dL 440  102  725   BUN 8 - 23 mg/dL 14  19  19    Creatinine 0.61 - 1.24 mg/dL 3.66  4.40  3.47   Sodium 135 - 145 mmol/L 137  135  137   Potassium 3.5 - 5.1 mmol/L 4.6  5.0  5.0   Chloride 98 - 111 mmol/L 99  100  101   CO2 22 - 32 mmol/L 29  28  28    Calcium 8.9 - 10.3 mg/dL 8.2  8.5  8.8       CBC:    Latest Ref Rng & Units 04/25/2023    5:41 AM 04/18/2023    5:34 AM 04/14/2023    6:31 AM  CBC  WBC 4.0 - 10.5 K/uL 4.5  5.4  7.0   Hemoglobin 13.0 - 17.0 g/dL 9.5  9.1  9.9   Hematocrit 39.0 - 52.0 % 30.1  27.8  30.3   Platelets 150 - 400 K/uL 351  353  360      Brief HPI:   Nathan Berg is a 67 y.o. male with a history of prostate cancer who had undergone outpatient workup with [facility] on 03/25/2023 with MRI T-spine which showed concern for new metastatic disease involving thoracic spine with concern for cord compression. He was then referred to the ER. MRI thoracic spine repeated on admission and evaluated by neurosurgery, started on steroids. He was admitted to hospital for further evaluation and treatment. On 9/26 he underwent decompressive laminectomy by Dr. Wynetta Emery. Remains on Decadron. Has history of depression, anxiety, GERD. He had CT simulation done for radiation treatment in the future. He is tolerating his diet. I have  messaged NS service in regards to converting Decadron to oral, length of therapy and clearance for pharmacologic DVT prophylaxis. He requires multiple medications to control his pain.reports pain feels like a band around his chest. 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    Hospital Course: Nathan Berg was admitted to rehab 04/13/2023 for inpatient therapies to consist of PT, ST and OT at least three hours five days a week. Past admission physiatrist, therapy team and rehab RN have worked together to provide customized collaborative inpatient rehab. Hgb improved 10/10. Adjusted Duragesic patch now that Dilaudid IV has been stopped. Sorbitol given for constipation. Lovenox started for DVT prophy. Linear pattern of vesicles noted along T3 dermatome of left anterior chest. Initially started on acyclovir. ID consultation obtained and vesicle unroofed and sample sent to lab. Changed to valacyclovir. Positve VZV PCR. Pain in T3 dermatome noted 10/21 and increased gabapentin to 600 mg TID. Given Xanax prn prior to radiation treatment sessions due to anxiety. Neuropsychology consult 10/22. Started Amantadine 100 mg daily for poor energy level 10/21.   Wants to come off pain meds. Explained will  go home with 7 days of meds- however will need to get from Oncology-    Blood pressures were monitored on TID basis and remained stable overall.  Rehab course: During patient's stay in rehab weekly team conferences were held to monitor patient's progress, set goals and discuss barriers to discharge. At admission, patient required min with basic self-care skills.   He has had improvement in activity tolerance, balance, postural control as well as ability to compensate for deficits. He has had improvement in functional use RUE/LUE  and RLE/LLE as well as improvement in awareness. Patient has met 8 of 8 long term goals due to improved activity tolerance, improved balance, improved postural control, and ability  to compensate for deficits.  Patient to discharge at an ambulatory level Supervision.   Patient's care partner is independent to provide the necessary physical assistance at discharge. Pt to d/c home with his wife. He is competent to direct his own care. Goals and care tool scored at typical performance, pt noted to have increased fatigue related to cancer treatments, so provided CGA at times for safety. Provided education on when to request increased assist, pt agrees.   Discharge disposition: 01-Home or Self Care     Diet: Regular  Special Instructions: No driving, alcohol consumption or tobacco use.  30-35 minutes were spent on discharge planning and discharge summary.   Allergies as of 04/30/2023       Reactions   Codeine Nausea And Vomiting   Penicillin G Other (See Comments)   Stomach upset        Medication List     STOP taking these medications    dexamethasone 10 MG/ML injection Commonly known as: DECADRON       TAKE these medications    acetaminophen 325 MG tablet Commonly known as: TYLENOL Take 1-2 tablets (325-650 mg total) by mouth every 4 (four) hours as needed for mild pain (pain score 1-3). What changed:  medication strength how much to take when to take this reasons to take this   ALPRAZolam 1 MG tablet Commonly known as: XANAX TAKE 1/2 TO 1 TABLET BY MOUTH TWICE DAILY AS NEEDED FOR ANXIETY. MUST HAVE OFFICE OR TELEMEDICINE VISIT FOR REFILLS What changed:  medication strength how much to take how to take this when to take this reasons to take this additional instructions   amantadine 100 MG capsule Commonly known as: SYMMETREL Take 1 capsule (100 mg total) by mouth daily.   cyanocobalamin 1000 MCG tablet Commonly known as: VITAMIN B12 Take 1 tablet (1,000 mcg total) by mouth daily.   dexamethasone 4 MG tablet Commonly known as: DECADRON Take 1 tablet (4 mg total) by mouth every 6 (six) hours.   docusate sodium 100 MG  capsule Commonly known as: Colace Take 1 capsule (100 mg total) by mouth 2 (two) times daily.   enzalutamide 80 MG tablet Commonly known as: XTANDI Take 2 tablets (160 mg total) by mouth daily.   escitalopram 20 MG tablet Commonly known as: LEXAPRO Take 1.5 tablets (30 mg total) by mouth daily.   fentaNYL 25 MCG/HR Commonly known as: DURAGESIC Place 1 patch onto the skin every 3 (three) days.   gabapentin 300 MG capsule Commonly known as: Neurontin Take 3 capsules in the morning, 3 capsules in the afternoon and 3 capsules at nighttime What changed: additional instructions   HYDROmorphone 2 MG tablet Commonly known as: DILAUDID Take 1-2 tablets by mouth every 4 hours as needed for severe pain (pain score 7-10)  or moderate pain (pain score 4-6) (2 mg: pain score 4-6/10 not relieved by ibuprofen, - 4 mg: pain score 7-10/10).   ibuprofen 200 MG tablet Commonly known as: ADVIL Take 800 mg by mouth as needed for mild pain or moderate pain.   lactulose 10 GM/15ML solution Commonly known as: CHRONULAC Take 30 mLs (20 g total) by mouth 2 (two) times daily as needed for mild constipation, moderate constipation or severe constipation (use if inadequate response to Miralax).   lidocaine 5 % Commonly known as: LIDODERM Place 1 patch onto the skin daily. Remove & Discard patch within 12 hours or as directed by MD   magnesium oxide 400 (240 Mg) MG tablet Commonly known as: MAG-OX Take 1 tablet (400 mg total) by mouth 2 (two) times daily.   melatonin 3 MG Tabs tablet Take 1 tablet (3 mg total) by mouth at bedtime as needed (insomnia).   pantoprazole 40 MG tablet Commonly known as: PROTONIX Take 1 tablet (40 mg total) by mouth 2 (two) times daily before a meal.   QUEtiapine 100 MG tablet Commonly known as: SEROQUEL Take 2 tablets (200 mg total) by mouth at bedtime.   traZODone 50 MG tablet Commonly known as: DESYREL Take 0.5 tablets (25 mg total) by mouth at bedtime. What  changed:  medication strength how much to take additional instructions   valACYclovir 1000 MG tablet Commonly known as: VALTREX Take 1 tablet (1,000 mg total) by mouth 3 (three) times daily for 6 days.        Follow-up Information     Lovorn, Aundra Millet, MD Follow up.   Specialty: Physical Medicine and Rehabilitation Why: office will call you to arrange your appt (sent) Contact information: [address] [place] [address] [phone]         Donalee Citrin, MD Follow up.   Specialty: Neurosurgery Why: Call the office on Monday to make arrangments for hosptial follow-up appointment. Contact information: [address] [place] [address] [phone]         Margaretmary Dys, MD Follow up.   Specialty: Radiation Oncology Contact information: [address] [place] [address] [phone]         Melven Sartorius, MD Follow up.   Specialty: Oncology Why: Call Monday to make hospital follow-up appointment. Contact information: [address] [place] [address] [phone]                 Signed: Milinda Antis 05/02/2023, 3:52 PM

## 2023-04-14 NOTE — Progress Notes (Signed)
Physical Therapy Session Note  Patient Details  Name: Nathan Berg MRN: [id number] Date of Birth: September 10, 1955  Today's Date: 04/14/2023 PT Individual Time: 1350-1458 PT Individual Time Calculation (min): 68 min   Short Term Goals: Week 1:  PT Short Term Goal 1 (Week 1): Pt will ambulate >100 ft with RW PT Short Term Goal 2 (Week 1): Pt will navigate stairs per home environment with assist PT Short Term Goal 3 (Week 1): Pt will sit OOB for whole day of therapy.  Skilled Therapeutic Interventions/Progress Updates: Pt presented in bed agreeable to therapy. Pt states pain 5/10 in back, premedicated, rest and repositioning provided as needed. Performed supine to sit with CGA and use of bed features with verbal cues to maintain spinal precautions. Pt then completed stand step transfer to w/c with minA. Pt propelled w/c to ortho gym using BUE for general conditioning. In ortho gym completed ambulatory transfer to mat with minA. Pt then completed TUGin 44 sec with RW and minA with x 1 episode of knee buckling with pt able to recover without assist. Pt also completed 5x STS in 45 sec. Pt then participated in several bouts of horseshoes in standing for dynamic reaching as well as standing tolerance. Pt returned to w/c for supported sitting and completed seated therex as follows: LAQ 3lb, seated hips flexion 3lb, hamstring pulls with green theraband and hip er with green theraband all performed 2 x 10 bilaterally. Pt then transported back to room and pt requesting to use toilet prior to returning to bed. Pt's w/c taken to bathroom and pt performed stand pivot with use of wall rails. Performed clothing management with CGA and had continent urinary void at toilet. Pt stood with CGA bracing BLE against toilet and was able to pull pants over hips with CGA and complete transfer back to w/c. Pt transported to EOB and completed stand pivot transfer to bed. Completed sit to supine with supervision, use of bed features  and was able to maintain spinal precautions. Pt left in bed at end of session with bed alarm on, call bell within reach and needs met.    Therapy Documentation Precautions:  Precautions Precautions: Fall, Back Precaution Comments: no brace needed; recalled 3/3 precautions without cue Restrictions Weight Bearing Restrictions: No General:   Vital Signs: Therapy Vitals Temp: 98.3 F (36.8 C) Pulse Rate: 80 Resp: 18 BP: 129/80 Patient Position (if appropriate): Lying Oxygen Therapy SpO2: 97 % O2 Device: Room Air Pain:   Mobility: Bed Mobility Bed Mobility: Supine to Sit Rolling Right: Supervision/verbal cueing Rolling Left: Supervision/Verbal cueing Supine to Sit: Contact Guard/Touching assist Transfers Transfers: Sit to Stand;Stand to Sit;Stand Pivot Transfers Sit to Stand: Contact Guard/Touching assist Stand to Sit: Contact Guard/Touching assist Stand Pivot Transfers: Minimal Assistance - Patient > 75% Transfer (Assistive device): Rolling walker Locomotion : Gait Ambulation: Yes Gait Assistance: Minimal Assistance - Patient > 75% Gait Distance (Feet): 84 Feet Assistive device: Rolling walker Gait Assistance Details: Verbal cues for safe use of DME/AE;Verbal cues for precautions/safety;Verbal cues for technique;Verbal cues for gait pattern Gait Gait: Yes Gait Pattern: Impaired Gait Pattern: Right flexed knee in stance;Left flexed knee in stance;Narrow base of support Stairs / Additional Locomotion Stairs: Yes Stairs Assistance: Minimal Assistance - Patient > 75% Stair Management Technique: Two rails Number of Stairs: 4 Height of Stairs: 6 Wheelchair Mobility Wheelchair Mobility: Yes Wheelchair Assistance: Doctor, general practice: Both upper extremities Wheelchair Parts Management: Supervision/cueing Distance: 150 ft  Trunk/Postural Assessment : Cervical Assessment Cervical Assessment:  Within Functional Limits Thoracic  Assessment Thoracic Assessment:  (back precautions) Lumbar Assessment Lumbar Assessment: Exceptions to [facility] (back precautions) Postural Control Postural Control: Deficits on evaluation  Balance: Balance Balance Assessed: Yes Standardized Balance Assessment Standardized Balance Assessment: Timed Up and Go Test Timed Up and Go Test TUG: Normal TUG Normal TUG (seconds): 44 Static Sitting Balance Static Sitting - Balance Support: Feet supported Static Sitting - Level of Assistance: 6: Modified independent (Device/Increase time) Dynamic Sitting Balance Dynamic Sitting - Balance Support: Feet supported;During functional activity Dynamic Sitting - Level of Assistance: 5: Stand by assistance Dynamic Sitting - Balance Activities: Reaching for objects Static Standing Balance Static Standing - Balance Support: During functional activity;Bilateral upper extremity supported Static Standing - Level of Assistance: 5: Stand by assistance Dynamic Standing Balance Dynamic Standing - Balance Support: During functional activity Dynamic Standing - Level of Assistance: 4: Min assist Exercises:   Other Treatments:      Therapy/Group: Individual Therapy  Mekala Winger 04/14/2023, 3:55 PM

## 2023-04-14 NOTE — Progress Notes (Signed)
Inpatient Rehabilitation  Patient information reviewed and entered into eRehab system by Demarrio Menges Azariah Bonura, OTR/L, Rehab Quality Coordinator.   Information including medical coding, functional ability and quality indicators will be reviewed and updated through discharge.   

## 2023-04-14 NOTE — Progress Notes (Addendum)
PROGRESS NOTE   Subjective/Complaints:  Pt reports not doing great this AM- got pain meds  in middle of night, which delayed getting them this Am, since wasn't far enough out.  Doesn't feel like Oxy makes more improvement in pain than Advil- we discussed options to control pain better- decided to reduce Oxy dosing and increase Duragesic- explained can take up to 3 days to hit "steady state"- hit it's maximal improvement.   Needs to go to bathroom- notes in acute was getting to bathroom without much assistance-explained we need to call for nursing here.  LBM 2 days ago- unusual for him- doesn't want to "blow out".    ROS:  Pt denies SOB, abd pain, CP, N/V/ (+)C/D, and vision changes Pain limiting (+)  Objective:   No results found. Recent Labs    04/14/23 0631  WBC 7.0  HGB 9.9*  HCT 30.3*  PLT 360   Recent Labs    04/14/23 0631  NA 137  K 5.0  CL 101  CO2 28  GLUCOSE 110*  BUN 19  CREATININE 0.83  CALCIUM 8.8*    Intake/Output Summary (Last 24 hours) at 04/14/2023 0840 Last data filed at 04/14/2023 0831 Gross per 24 hour  Intake 580 ml  Output 1000 ml  Net -420 ml        Physical Exam: Vital Signs Blood pressure 138/72, pulse 74, temperature (!) 97.4 F (36.3 C), resp. rate 17, height 5\' 10"  (1.778 m), weight 86.9 kg, SpO2 96%.    General: awake, alert, appropriate,  sitting up in bed; appears very uncomfortable; NAD HENT: conjugate gaze; oropharynx moist CV: regular rate and rhythm; no JVD Pulmonary: CTA B/L; no W/R/R- good air movement GI: soft, NT, ND, (+)BS- hypoactive- slightly protuberant Psychiatric: appropriate- frustrated about pain- interactive Neurological: Ox3 Skin- posterior Cervical spine incision- steristrips intact Neuro:  Alert and oriented x 3, follows commands, CN 2-12 intact, no speech or language deficits noted, good insight and awareness RUE: 5/5 Deltoid, 5/5 Biceps, 5/5  Triceps, 5/5 Wrist Ext, 5/5 Grip LUE: 4/5 Deltoid, 5/5 Biceps, 5/5 Triceps, 5/5 Wrist Ext, 5/5 Grip RLE: HF 5/5, KE 5/5, ADF 5/5, APF 5/5 LLE: HF 5/5, KE 5/5,  ADF 5/5, APF 5/5 Sensory exam normal for light touch and pain in all 4 limbs. No limb ataxia or cerebellar signs. No abnormal tone appreciated.  No ankle clonus Musculoskeletal: L shoulder pain and crepitus with PROM, no significant L shoulder tenderness   .  Assessment/Plan: 1. Functional deficits which require 3+ hours per day of interdisciplinary therapy in a comprehensive inpatient rehab setting. Physiatrist is providing close team supervision and 24 hour management of active medical problems listed below. Physiatrist and rehab team continue to assess barriers to discharge/monitor patient progress toward functional and medical goals  Care Tool:  Bathing              Bathing assist       Upper Body Dressing/Undressing Upper body dressing        Upper body assist      Lower Body Dressing/Undressing Lower body dressing            Lower body assist  Toileting Toileting    Toileting assist       Transfers Chair/bed transfer  Transfers assist           Locomotion Ambulation   Ambulation assist              Walk 10 feet activity   Assist           Walk 50 feet activity   Assist           Walk 150 feet activity   Assist           Walk 10 feet on uneven surface  activity   Assist           Wheelchair     Assist               Wheelchair 50 feet with 2 turns activity    Assist            Wheelchair 150 feet activity     Assist          Blood pressure 138/72, pulse 74, temperature (!) 97.4 F (36.3 C), resp. rate 17, height 5\' 10"  (1.778 m), weight 86.9 kg, SpO2 96%.  Medical Problem List and Plan: 1. 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              -Plan for radiation treatments week of 10/14             -patient may shower             -ELOS/Goals: 10-12, PT/OT Supervision              -Admit to CIR   First day of evaluations- Con't CIR PT and OT- needs schedule 2.  Antithrombotics: -DVT/anticoagulation:  Mechanical:  Antiembolism stockings, knee (TED hose) Bilateral lower extremities. Awaiting call back from NS regarding initiating pharmacologic DVT prophylaxis             -antiplatelet therapy: none   3. Pain Management: Tylenol as needed             -continue Flexeril 10 mg TID prn             -continue Duragesic patch 12/mcg/hr q 72 hours -continue gabapentin 300 mg TID             -continue Lidoderm              -continue oxycodone 10 mg q 4 hrs prn             -continue ibuprofen 400 mg q 6 hrs prn   10/10- will change Duragesic to 25 mcg q3 days as well as reduce Oxy to 5 mg q4 hours prn- since feels like patch more effective than Oxy.  4. Mood/Behavior/Sleep: LCSW to evaluate and provide emotional support             -History of GAD             -continue Lexapro 30 mg daily             -continue Seroquel 200 mg q HS             -continue trazodone 25 mg q HS             -continue Xanax 0.25 mg BID prn             -  continue melatonin 3 mg q HS prn              10/10- slept OK 5. Neuropsych/cognition: This patient is capable of making decisions on his own behalf.   6. Skin/Wound Care: Routine skin care checks             -monitor incision>>keep the Steri-Strips dry during showers per Dr. Wynetta Emery   7. Fluids/Electrolytes/Nutrition: Routine Is and Os and follow-up chemistries             -continue Mag-ox 400 mg BID             -continue B12 1000 mcg daily             -Hypokalemia/Hyponatremia- Recheck labs tomorrow   10/10- K+ 5.0- and Na 137- had been hypokalemic- so likely hemolysis- wil recheck Monday 8: Hypertension:  controlled, monitor TID and prn   9: GERD: continue Protonix   10: History of prostate cancer with mets to spine s/p decompressive laminectomy T3 and evacuation of epidural tumor Dr. Wynetta Emery 09/26             -continue Xtandi             -continue Decadron             -no brace needed; no twisting, arching, bending             -follow-up with Dr. Wynetta Emery, Dr. Mosetta Putt and Dr. Kathrynn Running   11: Anemia: multifactorial             -follow-up CBC   1010- Hb 9.9 a little better 12. Constipation  10/10- will try Sorbitol 15cc- since doesn't want to blow out- since LBM 2 days ago- and feels constipated.     I spent a total of 52   minutes on total care today- >50% coordination of care- due to  D/w pt about pain options- spent 27 minute son this topic and made changes- also reviewed chart, reviewed labs, as well as vitals/in and outs- doesn't appear, so far, to have Neurogenic bowel and bladder- however will monitor- also spoke with nursing about changes in regimen      LOS: 1 days A FACE TO FACE EVALUATION WAS PERFORMED  Nathan Berg 04/14/2023, 8:40 AM

## 2023-04-14 NOTE — Progress Notes (Addendum)
Physical Therapy Assessment and Plan  Patient Details  Name: Nathan Berg MRN: [id number] Date of Birth: Oct 23, 1955  PT Diagnosis: Abnormal posture, Abnormality of gait, Difficulty walking, Impaired sensation, and Paraplegia Rehab Potential: Excellent ELOS:   10-12 days  Today's Date: 04/14/2023 PT Individual Time: 0845-1000 PT Individual Time Calculation (min): 75 min    Hospital Problem: Principal Problem:   Thoracic myelopathy Active Problems:   Prostate cancer (HCC)   Compression fracture of T3 vertebra (HCC)   Anemia   Primary hypertension   Past Medical History:  Past Medical History:  Diagnosis Date   Anxiety    Arthritis    Cancer (HCC)    Prostate cancer   Depression    GERD (gastroesophageal reflux disease)    H/O seasonal allergies    History of kidney stones    x1   Prostate cancer (HCC)    Past Surgical History:  Past Surgical History:  Procedure Laterality Date   CHEST TUBE INSERTION  07/16/2019   trauma    COLONOSCOPY     HERNIA REPAIR Bilateral    20 yrs ago   LAMINECTOMY N/A 03/31/2023   Procedure: THORACIC LAMINECTOMY FOR TUMOR;  Surgeon: Donalee Citrin, MD;  Location: [facility] OR;  Service: Neurosurgery;  Laterality: N/A;   LYMPHADENECTOMY Bilateral 09/19/2013   Procedure: LYMPHADENECTOMY  PELVIC LYMPH NODE DISSECTION;  Surgeon: Valetta Fuller, MD;  Location: [facility];  Service: Urology;  Laterality: Bilateral;   PROSTATE BIOPSY     ROBOT ASSISTED LAPAROSCOPIC RADICAL PROSTATECTOMY N/A 09/19/2013   Procedure: ROBOTIC ASSISTED LAPAROSCOPIC RADICAL PROSTATECTOMY;  Surgeon: Valetta Fuller, MD;  Location: [facility];  Service: Urology;  Laterality: N/A;   TONSILLECTOMY     child   VARICOCELE EXCISION      Assessment & Plan Clinical Impression: Nathan Berg is a 67 year old male with a history of prostate cancer who had undergone outpatient workup with [facility] on 03/25/2023 with MRI T-spine which showed concern for new metastatic disease involving thoracic  spine with concern for cord compression. He was then referred to the ER. MRI thoracic spine repeated on admission and evaluated by neurosurgery, started on steroids. He was admitted to hospital for further evaluation and treatment. On 9/26 he underwent decompressive laminectomy by Dr. Wynetta Emery. Remains on Decadron. Has history of depression, anxiety, GERD. He had CT simulation done for radiation treatment in the future. He is tolerating his diet. I have messaged NS service in regards to converting Decadron to oral, length of therapy and clearance for pharmacologic DVT prophylaxis. He requires multiple medications to control his pain.reports pain feels like a band around his chest. 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 He continues to have the patient requires inpatient medicine and rehabilitation evaluations and services for ongoing functional deficits and decreased mobility. Patient transferred to CIR on 04/13/2023 .   Patient currently requires min with mobility secondary to muscle weakness, impaired timing and sequencing and abnormal tone, and decreased standing balance, decreased balance strategies, and difficulty maintaining precautions.  Prior to hospitalization, patient was independent  with mobility and lived with Spouse in a House home.  Home access is 3Stairs to enter.  Patient will benefit from skilled PT intervention to maximize safe functional mobility, minimize fall risk, and decrease caregiver burden for planned discharge home with 24 hour supervision.  Anticipate patient will benefit from follow up OP at discharge.  PT - End of Session Endurance Deficit: Yes Endurance Deficit Description: Pt reports edurance deficit prior d/t  cancer medication, although worse now Pt destination: home  PT Evaluation Precautions/Restrictions Precautions Precautions: Fall;Back Precaution Comments: no brace needed; recalled 3/3 precautions without cue Restrictions Weight Bearing Restrictions:  No General   Vital SignsTherapy Vitals Temp: 98.3 F (36.8 C) Pulse Rate: 80 Resp: 18 BP: 129/80 Patient Position (if appropriate): Lying Oxygen Therapy SpO2: 97 % O2 Device: Room Air Pain Pain Assessment Pain Scale: 0-10 Pain Score: 4  Pain Type: Surgical pain Pain Location: Back Pain Orientation: Mid Pain Descriptors / Indicators: Aching Pain Interference Pain Interference Pain Effect on Sleep: 1. Rarely or not at all Pain Interference with Therapy Activities: 1. Rarely or not at all Pain Interference with Day-to-Day Activities: 1. Rarely or not at all Home Living/Prior Functioning Home Living Available Help at Discharge: Family;Available 24 hours/day Type of Home: House Home Access: Stairs to enter [employer] of Steps: 3 Entrance Stairs-Rails: Right;Left (cannot reach both) Home Layout: Multi-level Alternate Level Stairs-Number of Steps: 2 steps for living areas, no rails Alternate Level Stairs-Rails: None Bathroom Shower/Tub: Walk-in shower (with grab bars) Bathroom Toilet: Standard Additional Comments: owns SPC, has access to RW, TTB  Lives With: Spouse Prior Function Level of Independence: Independent with gait;Independent with transfers  Able to Take Stairs?: Yes Driving: Yes 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 Leisure: Hobbies-yes (Comment) (see above) Vision/Perception  Vision - History Ability to See in Adequate Light: 0 Adequate Perception Perception: Within Functional Limits Praxis Praxis: WFL  Cognition Overall Cognitive Status: Within Functional Limits for tasks assessed Arousal/Alertness: Awake/alert Orientation Level: Oriented X4 Memory: Appears intact Awareness: Appears intact Problem Solving: Appears intact Safety/Judgment: Appears intact Sensation Sensation Light Touch: Impaired Detail Peripheral sensation comments: mild deficits from mid thigh down, ~90% normal Light Touch Impaired Details:  Impaired RLE;Impaired LLE Hot/Cold: Appears Intact Proprioception: Appears Intact Stereognosis: Not tested Coordination Gross Motor Movements are Fluid and Coordinated: Yes Fine Motor Movements are Fluid and Coordinated: No (intention tremor, reports has this at baseline) Motor  Motor Motor - Skilled Clinical Observations: generalized weakness from tumor removeal at T3   Trunk/Postural Assessment  Cervical Assessment Cervical Assessment: Within Functional Limits Thoracic Assessment Thoracic Assessment:  (thoracic surgery) Lumbar Assessment Lumbar Assessment: Exceptions to [facility] (posterior pelvic tilt) Postural Control Postural Control: Deficits on evaluation (delayed responses)  Balance Balance Balance Assessed: Yes Static Sitting Balance Static Sitting - Balance Support: Feet supported Static Sitting - Level of Assistance: 6: Modified independent (Device/Increase time) Dynamic Sitting Balance Dynamic Sitting - Balance Support: Feet supported;During functional activity Dynamic Sitting - Level of Assistance: 5: Stand by assistance Dynamic Sitting - Balance Activities: Reaching for objects Static Standing Balance Static Standing - Balance Support: During functional activity;Bilateral upper extremity supported Static Standing - Level of Assistance: 5: Stand by assistance Dynamic Standing Balance Dynamic Standing - Balance Support: During functional activity Dynamic Standing - Level of Assistance: 4: Min assist Extremity Assessment  RUE Assessment RUE Assessment: Within Functional Limits Active Range of Motion (AROM) Comments: WNL General Strength Comments: 5/5 overall LUE Assessment LUE Assessment: Within Functional Limits Active Range of Motion (AROM) Comments: WFL for movement, although has arthritis in shoulder at baseline General Strength Comments: overall 5/5 RLE Assessment RLE Assessment: Exceptions to [facility] General Strength Comments: grossly 4/5, endurance limited LLE  Assessment LLE Assessment: Exceptions to [facility] General Strength Comments: grossly 4+/5, limited by endurance  Care Tool Care Tool Bed Mobility Roll left and right activity   Roll left and right assist level: Supervision/Verbal cueing    Sit to lying  activity   Sit to lying assist level: Supervision/Verbal cueing    Lying to sitting on side of bed activity   Lying to sitting on side of bed assist level: the ability to move from lying on the back to sitting on the side of the bed with no back support.: Supervision/Verbal cueing     Care Tool Transfers Sit to stand transfer   Sit to stand assist level: Contact Guard/Touching assist    Chair/bed transfer   Chair/bed transfer assist level: Minimal Assistance - Patient > 75%     Toilet transfer   Assist Level: Contact Guard/Touching assist    Car transfer   Car transfer assist level: Minimal Assistance - Patient > 75%      Care Tool Locomotion Ambulation   Assist level: Minimal Assistance - Patient > 75% Assistive device: Walker-rolling Max distance: 84 ft  Walk 10 feet activity   Assist level: Minimal Assistance - Patient > 75% Assistive device: Walker-rolling   Walk 50 feet with 2 turns activity   Assist level: Minimal Assistance - Patient > 75% Assistive device: Walker-rolling  Walk 150 feet activity Walk 150 feet activity did not occur: Safety/medical concerns      Walk 10 feet on uneven surfaces activity Walk 10 feet on uneven surfaces activity did not occur: Safety/medical concerns      Stairs   Assist level: Minimal Assistance - Patient > 75% Stairs assistive device: 2 hand rails    Walk up/down 1 step activity   Walk up/down 1 step (curb) assist level: Minimal Assistance - Patient > 75% Walk up/down 1 step or curb assistive device: 2 hand rails  Walk up/down 4 steps activity   Walk up/down 4 steps assist level: Minimal Assistance - Patient > 75% Walk up/down 4 steps assistive device: 2 hand rails  Walk  up/down 12 steps activity Walk up/down 12 steps activity did not occur: Safety/medical concerns      Pick up small objects from floor Pick up small object from the floor (from standing position) activity did not occur: Safety/medical concerns      Wheelchair Is the patient using a wheelchair?: Yes Type of Wheelchair: Manual   Wheelchair assist level: Supervision/Verbal cueing Max wheelchair distance: 150 ft  Wheel 50 feet with 2 turns activity   Assist Level: Supervision/Verbal cueing  Wheel 150 feet activity   Assist Level: Supervision/Verbal cueing    Refer to Care Plan for Long Term Goals  SHORT TERM GOAL WEEK 1    Recommendations for other services: Therapeutic Recreation  Stress management and Outing/community reintegration  Skilled Therapeutic Intervention Evaluation completed (see details above) with patient education regarding purpose of PT evaluation, PT POC and goals, therapy schedule, weekly team meetings, and other CIR information including safety plan and fall risk safety.  Pt performed the below functional mobility tasks with the specified levels of skilled cuing and assistance.    Mobility Bed Mobility Bed Mobility: Supine to Sit Rolling Right: Supervision/verbal cueing Rolling Left: Supervision/Verbal cueing Supine to Sit: Contact Guard/Touching assist Transfers Transfers: Sit to Stand;Stand to Sit;Stand Pivot Transfers Sit to Stand: Contact Guard/Touching assist Stand to Sit: Contact Guard/Touching assist Stand Pivot Transfers: Contact Guard/Touching assist Transfer (Assistive device): Rolling walker Locomotion  Gait Ambulation: Yes Gait Assistance: Minimal Assistance - Patient > 75% Gait Distance (Feet): 84 Feet Assistive device: Rolling walker Gait Assistance Details: Verbal cues for safe use of DME/AE;Verbal cues for precautions/safety;Verbal cues for technique;Verbal cues for gait pattern Gait Gait: Yes Gait Pattern:  Impaired Gait Pattern: Right  flexed knee in stance;Left flexed knee in stance;Narrow base of support Stairs / Additional Locomotion Stairs: Yes Stairs Assistance: Minimal Assistance - Patient > 75% Stair Management Technique: Two rails Number of Stairs: 4 Height of Stairs: 6 Wheelchair Mobility Wheelchair Mobility: Yes Wheelchair Assistance: Doctor, general practice: Both upper extremities Wheelchair Parts Management: Supervision/cueing Distance: 150 ft   Discharge Criteria: Patient will be discharged from PT if patient refuses treatment 3 consecutive times without medical reason, if treatment goals not met, if there is a change in medical status, if patient makes no progress towards goals or if patient is discharged from hospital.  The above assessment, treatment plan, treatment alternatives and goals were discussed and mutually agreed upon: by patient  Juluis Rainier 04/14/2023, 1:27 PM

## 2023-04-14 NOTE — Progress Notes (Signed)
Inpatient Rehabilitation Care Coordinator Assessment and Plan Patient Details  Name: Nathan Berg MRN: [id number] Date of Birth: 02/19/1956  Today's Date: 04/14/2023  Hospital Problems: Principal Problem:   Thoracic myelopathy Active Problems:   Prostate cancer (HCC)   Compression fracture of T3 vertebra (HCC)   Anemia   Primary hypertension  Past Medical History:  Past Medical History:  Diagnosis Date   Anxiety    Arthritis    Cancer (HCC)    Prostate cancer   Depression    GERD (gastroesophageal reflux disease)    H/O seasonal allergies    History of kidney stones    x1   Prostate cancer (HCC)    Past Surgical History:  Past Surgical History:  Procedure Laterality Date   CHEST TUBE INSERTION  07/16/2019   trauma    COLONOSCOPY     HERNIA REPAIR Bilateral    20 yrs ago   LAMINECTOMY N/A 03/31/2023   Procedure: THORACIC LAMINECTOMY FOR TUMOR;  Surgeon: Donalee Citrin, MD;  Location: [facility] OR;  Service: Neurosurgery;  Laterality: N/A;   LYMPHADENECTOMY Bilateral 09/19/2013   Procedure: LYMPHADENECTOMY  PELVIC LYMPH NODE DISSECTION;  Surgeon: Valetta Fuller, MD;  Location: [facility];  Service: Urology;  Laterality: Bilateral;   PROSTATE BIOPSY     ROBOT ASSISTED LAPAROSCOPIC RADICAL PROSTATECTOMY N/A 09/19/2013   Procedure: ROBOTIC ASSISTED LAPAROSCOPIC RADICAL PROSTATECTOMY;  Surgeon: Valetta Fuller, MD;  Location: [facility];  Service: Urology;  Laterality: N/A;   TONSILLECTOMY     child   VARICOCELE EXCISION     Social History:  reports that he has never smoked. He has never used smokeless tobacco. He reports current alcohol use. He reports that he does not use drugs.  Family / Support Systems Marital Status: Married How Long?: 47 years Patient Roles: Spouse, Parent Spouse/Significant Other: Randa Evens (wife) Children: # sons- Harrold Donath (lives in [place]), Laredo (lives in [place]), and Reuel Boom (Lives in [place]) Other Supports: Mellody Dance- PRN support only Anticipated Caregiver:  Wife Ability/Limitations of Caregiver: Wife has arthritis in hands and unable to hold things well. Otherwise, she will provide 24/7 care. Caregiver Availability: 24/7 Family Dynamics: Pt lives with his wife.  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 He also cares for his 69 y.o. parents, and assists with care for his youngest son Reuel Boom. 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 Education: college Dentist - How often do you need to have someone help you when you read instructions, pamphlets, or other written material from your doctor or pharmacy?: Never Writes: Yes Employment Status: Retired Date Retired/Disabled/Unemployed: 2024 Legal History/Current Legal Issues: Denies Guardian/Conservator: Reports his wife is HCPOA   Abuse/Neglect Abuse/Neglect Assessment Can Be Completed: Yes Physical Abuse: Denies Verbal Abuse: Denies Sexual Abuse: Denies Exploitation of patient/patient's resources: Denies Self-Neglect: Denies  Patient response to: Social Isolation - How often do you feel lonely or isolated from those around you?: Never  Emotional Status Pt's affect, behavior and adjustment status: Pt is depressed at time of visit and adjusting as well as to be expected given cancer speading. He takes medication for depression. Recent Psychosocial Issues: See above Psychiatric History: Hx of depression. Pt states he takes medication for depression. Substance Abuse History: Pt reports he drinks atleasts 2 beers per week. Denies tobacco product use and rec drug use.  Patient /  Family Perceptions, Expectations & Goals Pt/Family understanding of illness & functional limitations: Pt has understanding of care needs Premorbid pt/family roles/activities: Independent Anticipated changes in  roles/activities/participation: Assistance with ADLs/IADLs Pt/family expectations/goals: Pt goal is to work on "not being depressed."  [employer]: None Premorbid Home Care/DME Agencies: None Transportation available at discharge: TBD Is the patient able to respond to transportation needs?: Yes In the past 12 months, has lack of transportation kept you from medical appointments or from getting medications?: No In the past 12 months, has lack of transportation kept you from meetings, work, or from getting things needed for daily living?: No Resource referrals recommended: Neuropsychology, Counseling  Discharge Planning Living Arrangements: Spouse/significant other, Other (Comment) (Rec Therapy) Support Systems: Spouse/significant other, Children Type of Residence: Private residence Insurance Resources: [employer] Financial Resources: Restaurant manager, fast food Screen Referred: No Living Expenses: Mortgage Money Management: Patient Does the patient have any problems obtaining your medications?: No Home Management: Pt and wife both manage home care needs. Patient/Family Preliminary Plans: TBD Care Coordinator Barriers to Discharge: Decreased caregiver support, Lack of/limited family support Care Coordinator Anticipated Follow Up Needs: HH/OP  Clinical Impression SW met with pt at bedside to introduce self, explain role, and discuss discharge process. Pt is not a Cytogeneticist. HCPOA-wife. SW will confirm if any DME. Pt aware SW to follow-up with his wife.   Jamicheal Heard A Amariyah Bazar 04/14/2023, 3:36 PM

## 2023-04-14 NOTE — Plan of Care (Signed)
  Problem: Consults Goal: RH SPINAL CORD INJURY PATIENT EDUCATION Description:  See Patient Education module for education specifics.  Outcome: Progressing Goal: Skin Care Protocol Initiated - if Braden Score 18 or less Description: If consults are not indicated, leave blank or document N/A Outcome: Progressing Goal: Nutrition Consult-if indicated Outcome: Progressing Goal: Diabetes Guidelines if Diabetic/Glucose > 140 Description: If diabetic or lab glucose is > 140 mg/dl - Initiate Diabetes/Hyperglycemia Guidelines & Document Interventions  Outcome: Progressing   Problem: SCI BOWEL ELIMINATION Goal: RH STG MANAGE BOWEL WITH ASSISTANCE Description: STG Manage Bowel with Mod I Assistance. Outcome: Progressing Goal: RH STG SCI MANAGE BOWEL WITH MEDICATION WITH ASSISTANCE Description: STG SCI Manage bowel with medication with mod I assistance. Outcome: Progressing Goal: RH STG MANAGE BOWEL W/EQUIPMENT W/ASSISTANCE Description: STG Manage Bowel With Equipment With Mod I Assistance Outcome: Progressing Goal: RH OTHER STG BOWEL ELIMINATION GOALS W/ASSIST Description: Other STG Bowel Elimination Goals With Mod I Assistance. Outcome: Progressing   Problem: SCI BLADDER ELIMINATION Goal: RH STG MANAGE BLADDER WITH ASSISTANCE Description: STG Manage Bladder With Mod I Assistance Outcome: Progressing Goal: RH STG MANAGE BLADDER WITH MEDICATION WITH ASSISTANCE Description: STG Manage Bladder With Medication With Mod I Assistance. Outcome: Progressing Goal: RH OTHER STG BLADDER ELIMINATION GOALS W/ASSIST Description: Other STG Bladder Elimination Goals With Assistance Outcome: Progressing   Problem: RH SKIN INTEGRITY Goal: RH STG SKIN FREE OF INFECTION/BREAKDOWN Outcome: Progressing Goal: RH STG MAINTAIN SKIN INTEGRITY WITH ASSISTANCE Description: STG Maintain Skin Integrity With Assistance. Outcome: Progressing   Problem: RH SAFETY Goal: RH STG ADHERE TO SAFETY PRECAUTIONS  W/ASSISTANCE/DEVICE Description: STG Adhere to Safety Precautions With Assistance/Device. Outcome: Progressing   Problem: RH KNOWLEDGE DEFICIT SCI Goal: RH STG INCREASE KNOWLEDGE OF SELF CARE AFTER SCI Outcome: Progressing   Problem: Education: Goal: Ability to verbalize activity precautions or restrictions will improve Outcome: Progressing Goal: Knowledge of the prescribed therapeutic regimen will improve Outcome: Progressing Goal: Understanding of discharge needs will improve Outcome: Progressing   Problem: Activity: Goal: Ability to avoid complications of mobility impairment will improve Outcome: Progressing Goal: Ability to tolerate increased activity will improve Outcome: Progressing Goal: Will remain free from falls Outcome: Progressing   Problem: Bowel/Gastric: Goal: Gastrointestinal status for postoperative course will improve Outcome: Progressing   Problem: Clinical Measurements: Goal: Ability to maintain clinical measurements within normal limits will improve Outcome: Progressing Goal: Postoperative complications will be avoided or minimized Outcome: Progressing Goal: Diagnostic test results will improve Outcome: Progressing   Problem: Pain Management: Goal: Pain level will decrease Outcome: Progressing   Problem: Skin Integrity: Goal: Will show signs of wound healing Outcome: Progressing   Problem: Health Behavior/Discharge Planning: Goal: Identification of resources available to assist in meeting health care needs will improve Outcome: Progressing   Problem: Bladder/Genitourinary: Goal: Urinary functional status for postoperative course will improve Outcome: Progressing

## 2023-04-14 NOTE — Progress Notes (Signed)
Nathan Berg   DOB:28-Sep-1955   VV#:[id number]    ASSESSMENT & PLAN:  67 y.o.male with history mCRPC, bone metastases presented with difficulty walking, numbness in the leg after outside imaging showed metastatic disease involving thoracic spine with concern for cord compression.    9/26. Underwent Procedure: Decompressive laminectomy T3 extending into the inferior aspect of the 2 3 disc base to below the 3 4 disc base at the level of T4 pedicle. With partial facetectomy and evacuation of epidural tumor on 03/31/23. Admitted to rehab yesterday. Planning to start radiation next week.   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  We still have chemotherapy as he has good functional status for example docetaxel, cabazitaxel, radiation therapy with radium 223, and Pluvicto. His alkaline phosphatase is not significantly elevated. Will repeat new baseline PSA after radiation Recommend outpatient PSMA PET scan upon discharge. Continue enzalutamide for now with ADT as outpatient   Upper back pain with band like sensation/known T3 metastasis S/p decompressive surgery Radiation plan per radiation oncology starting next week.   B12 deficiency Continue b12  Appreciate pain management and rehab per PM&R.  Thank you for the consult, if discharged, will follow-up as outpatient.  I will have our scheduler from medical oncology to contact the patient.   Please feel free to call if any questions. [phone]   Melven Sartorius, MD 04/14/2023 5:08 PM  Subjective:  Admitted to rehab. Report working with therapists for three hours today. More pain. Fentanyl dose increased today.  Objective:  Vitals:   04/14/23 0301 04/14/23 1300  BP: 138/72 129/80  Pulse: 74 80  Resp: 17 18  Temp: (!) 97.4 F (36.3 C) 98.3 F (36.8 C)  SpO2: 96% 97%     Intake/Output Summary (Last 24 hours) at 04/14/2023 1708 Last data filed at 04/14/2023  1258 Gross per 24 hour  Intake 820 ml  Output 1000 ml  Net -180 ml    GENERAL: alert, no distress and comfortable EYES: normal, sclera clear Musculoskeletal: no lower extremity edema NEURO: alert with fluent speech, lower extremities strength 5/5 Sensation equal to light touch bilateral lower extremities   Labs:  Recent Labs    03/09/23 0913 03/28/23 1800 03/30/23 0724 04/07/23 0805 04/08/23 0547 04/14/23 0631  NA 141 136   < > 132* 135 137  K 4.1 3.8   < > 3.3* 4.1 5.0  CL 106 100   < > 101 102 101  CO2 31 28   < > 23 26 28   GLUCOSE 86 105*   < > 132* 95 110*  BUN 16 15   < > 24* 24* 19  CREATININE 0.87 0.83   < > 0.98 0.81 0.83  CALCIUM 9.2 8.9   < > 8.1* 8.3* 8.8*  GFRNONAA >60 >60   < > >60 >60 >60  PROT 6.4* 7.2  --   --   --  5.4*  ALBUMIN 3.8 4.0  --   --   --  2.6*  AST 19 27  --   --   --  19  ALT 20 32  --   --   --  18  ALKPHOS 98 99  --   --   --  78  BILITOT 0.4 0.6  --   --   --  0.4   < > = values in this interval not displayed.    Studies:  MR THORACIC  SPINE W WO CONTRAST  Result Date: 04/11/2023 CLINICAL DATA:  Initial evaluation for metastatic disease evaluation. SRS protocol. EXAM: MRI THORACIC WITHOUT AND WITH CONTRAST TECHNIQUE: Multiplanar and multiecho pulse sequences of the thoracic spine were obtained without and with intravenous contrast. CONTRAST:  10mL GADAVIST GADOBUTROL 1 MMOL/ML IV SOLN COMPARISON:  Prior study from 03/30/2023. FINDINGS: Alignment:  A limited SRS protocol MRI was performed. Vertebral bodies normally aligned with preservation of the normal thoracic kyphosis. No listhesis. Vertebrae: Osseous metastases involving the T3 and T4 vertebral bodies are seen. There is complete replacement of the T3 and T4 vertebral bodies themselves, with involvement of the left posterior elements. Associated pathologic compression fracture of T3 with severe height loss and vertebral plana formation and 5 mm bony retropulsion. More mild compression  deformity of T4 with up to 20% height loss without retropulsion. Epidural extension of tumor at T2 through T4, most pronounced within the ventral epidural space. Patient is status post posterior decompression at T2-3 and T3-4. Residual severe stenosis at the level of T3 due to bony retropulsion and epidural tumor. Thecal sac measures 6 mm in AP diameter at its most narrow point (series 5, image 16). More mild stenosis noted at the levels of T2 and T4. Examination is to be utilized for treatment planning purposes. Additional osseous metastases are seen involving the T2 vertebral body without associated pathologic fracture or extraosseous extension. Additional osseous metastasis noted involving the C6 vertebral body on counter sequence (series 2, image 9). Few additional scattered small osseous metastases noted distally within the thoracic spine involving the T5, T8, and T10 vertebral bodies. No other pathologic compression fracture. No other epidural or extraosseous extension of tumor. Cord: Focal kinking of the upper thoracic cord at the level of T3 due to compression fracture and stenosis at this level (series 3, image 12). No visible cord signal changes. Paraspinal and other soft tissues: Postoperative changes noted within the upper thoracic posterior paraspinous soft tissues. No worrisome collections. 1 cm sebaceous cyst noted within the subcutaneous fat of the upper thoracic spine. Layering left pleural effusion partially visualized. Disc levels: Underlying mild for age spondylosis and multilevel facet arthrosis. No other significant spinal stenosis. Moderate left foraminal narrowing at T10-11. IMPRESSION: 1. Osseous metastases involving the T3 and T4 vertebral bodies. Associated pathologic compression fractures as above, severe at the T3 level. Epidural extension of tumor at T2 through T4 with sequelae of prior posterior decompression at T2-3 and T3-4. Residual severe stenosis at the level of T3 due to bony  retropulsion and epidural tumor. More mild stenosis at the levels of T2 and T4. Examination is to be utilized for treatment planning purposes. 2. Additional scattered osseous metastases involving the C6, T2, T5, T8, and T10 vertebral bodies. No other pathologic fracture or extraosseous extension of tumor. 3. Layering left pleural effusion. Electronically Signed   By: Rise Mu M.D.   On: 04/11/2023 21:58   DG Thoracic Spine 1 View  Result Date: 03/31/2023 CLINICAL DATA:  Thoracic laminectomy for tumor. EXAM: OPERATIVE THORACIC SPINE 1 VIEW(S) COMPARISON:  03/30/2023. FINDINGS: A single fluoroscopic image was obtained intraoperatively for thoracic laminectomy. Total fluoroscopy time is 2.9 seconds. Dose: 0.5 mGy. Please see operative report for additional information. IMPRESSION: Intraoperative utilization of fluoroscopy. Electronically Signed   By: Thornell Sartorius M.D.   On: 03/31/2023 20:01   DG C-Arm 1-60 Min-No Report  Result Date: 03/31/2023 Fluoroscopy was utilized by the requesting physician.  No radiographic interpretation.   MR OUTSIDE FILMS SPINE  Result Date: 03/30/2023 This examination belongs to an outside facility and is stored here for comparison purposes only.  Contact the originating outside institution for any associated report or interpretation.  CT THORACIC SPINE WO CONTRAST  Result Date: 03/30/2023 CLINICAL DATA:  Thoracic compression fracture.  Known malignancy. EXAM: CT THORACIC SPINE WITHOUT CONTRAST TECHNIQUE: Multidetector CT images of the thoracic were obtained using the standard protocol without intravenous contrast. RADIATION DOSE REDUCTION: This exam was performed according to the departmental dose-optimization program which includes automated exposure control, adjustment of the mA and/or kV according to patient size and/or use of iterative reconstruction technique. COMPARISON:  Thoracic spine MRI 1 day prior FINDINGS: Alignment: Normal. Vertebrae: Again seen is  marked compression deformity of the T3 vertebral body with near-complete loss of vertebral body height and 4 mm bony retropulsion consistent with pathologic fracture in the setting of osseous metastatic disease. There is also a nondisplaced fracture through the right T3 lamina and transverse process extending to the inferior articulating process (5-23, 7-37). Also again seen is the more mild pathologic compression fracture of the T4 vertebral body with approximately 40% loss of vertebral body height anteriorly and no bony retropulsion. There is a nondisplaced fracture of the right T4 transverse process (5-31, 7-32). The other vertebral body heights are preserved. The bones are diffusely demineralized. Known additional metastatic lesions as well as epidural tumor at T3 and T4 are better seen on the thoracic spine MRI from 1 day prior. Paraspinal and other soft tissues: Nodular scarring in the medial right lung apex is unchanged. A probable sebaceous cyst is noted in the subcutaneous fat just to the left of midline at the T1 level. A large hiatal hernia is unchanged. Disc levels: There is overall mild underlying degenerative change throughout the thoracic spine. Known severe spinal canal stenosis at T3 is better seen on the MRI. IMPRESSION: 1. Pathologic compression fractures of the T3 and T4 vertebral bodies are similar to the prior MRI, worse at T3 where there is essentially complete loss of vertebral body height and bony retropulsion. Known severe spinal canal stenosis due to the retropulsion and epidural tumor is better seen on the prior MRI. 2. Additional nondisplaced fractures of the right T3 lamina, transverse process, and inferior articulating process, and right T4 transverse process. Electronically Signed   By: Lesia Hausen M.D.   On: 03/30/2023 13:50   MR THORACIC SPINE W WO CONTRAST  Result Date: 03/30/2023 CLINICAL DATA:  Initial evaluation for musculoskeletal neoplasm, history of prostate cancer.  EXAM: MRI THORACIC WITHOUT AND WITH CONTRAST TECHNIQUE: Multiplanar and multiecho pulse sequences of the thoracic spine were obtained without and with intravenous contrast. CONTRAST:  9mL GADAVIST GADOBUTROL 1 MMOL/ML IV SOLN COMPARISON:  Prior study from 12/09/2022. FINDINGS: Alignment: Vertebral bodies normally aligned with preservation of the normal thoracic kyphosis. Trace degenerative retrolisthesis of L1 on L2 noted. Vertebrae: Osseous metastasis involving the T3 vertebral body is seen. Associated pathologic compression fracture with vertebral plana formation and up to 6 mm of bony retropulsion. Additional osseous metastasis involves the entirety of the T4 vertebral body. Associated 40% height loss without significant bony retropulsion. Tumor extends to involve the posterior elements bilaterally. Epidural extension of tumor at these levels with tumor within the ventral epidural space, most pronounced at T3 where there is resultant severe spinal stenosis and cord compression (series 10, image 17). Secondary cord flattening with suspected early/mild cord signal changes (series 4, image 13). Thecal sac measures approximately 4-5 mm in AP diameter. No more  than mild stenosis inferiorly at the level of T4. Moderate to severe bilateral foraminal narrowing noted at T2-3 and T3-4 as well. Multiple additional smaller osseous metastases seen scattered elsewhere throughout the thoracic spine. For reference purposes, the largest additional lesion involves the inferior aspect of T2 and measures 1.5 cm (series 5, image 12). No other significant extra osseous or epidural extension of tumor. No other pathologic compression fracture. Few additional scattered osseous metastases noted within the cervical spine on counter sequence. No visible extra osseous or epidural extension of tumor within the cervical spine itself. Cord: Cord impingement with suspected subtle early cord signal changes at the level of T3 as above. Otherwise  normal signal and morphology. Paraspinal and other soft tissues: Paraspinous soft tissues demonstrate no acute finding. Small layering left pleural effusion noted. Disc levels: Underlying mild for age multilevel thoracic spondylosis elsewhere throughout the thoracic spine. Scattered superimposed multilevel facet disease. No other significant spinal stenosis. Mild multilevel foraminal narrowing noted within the lower thoracic spine at T10-11 bilaterally and T12-L1 on the left. IMPRESSION: 1. Osseous metastases involving the T3 and T4 vertebral bodies with associated pathologic compression fractures as above. Epidural extension of tumor at these levels with resultant severe spinal stenosis and cord compression at the level of T3. Probable subtle early/mild cord signal changes at this level. 2. Multiple additional smaller osseous metastases scattered elsewhere throughout the thoracic spine. No other significant extra osseous or epidural extension of tumor. 3. Underlying mild for age thoracic spondylosis without other high-grade spinal stenosis. Mild multilevel foraminal narrowing within the lower thoracic spine as above. 4. Small layering left pleural effusion. Electronically Signed   By: Rise Mu M.D.   On: 03/30/2023 04:25

## 2023-04-14 NOTE — Plan of Care (Signed)
  Problem: RH Grooming Goal: LTG Patient will perform grooming w/assist,cues/equip (OT) Description: LTG: Patient will perform grooming with assist, with/without cues using equipment (OT) Flowsheets (Taken 04/14/2023 1314) LTG: Pt will perform grooming with assistance level of: Independent with assistive device    Problem: RH Bathing Goal: LTG Patient will bathe all body parts with assist levels (OT) Description: LTG: Patient will bathe all body parts with assist levels (OT) Flowsheets (Taken 04/14/2023 1314) LTG: Pt will perform bathing with assistance level/cueing: Independent with assistive device    Problem: RH Dressing Goal: LTG Patient will perform upper body dressing (OT) Description: LTG Patient will perform upper body dressing with assist, with/without cues (OT). Flowsheets (Taken 04/14/2023 1314) LTG: Pt will perform upper body dressing with assistance level of: Independent with assistive device Goal: LTG Patient will perform lower body dressing w/assist (OT) Description: LTG: Patient will perform lower body dressing with assist, with/without cues in positioning using equipment (OT) Flowsheets (Taken 04/14/2023 1314) LTG: Pt will perform lower body dressing with assistance level of: Independent with assistive device   Problem: RH Toileting Goal: LTG Patient will perform toileting task (3/3 steps) with assistance level (OT) Description: LTG: Patient will perform toileting task (3/3 steps) with assistance level (OT)  Flowsheets (Taken 04/14/2023 1314) LTG: Pt will perform toileting task (3/3 steps) with assistance level: Independent with assistive device   Problem: RH Toilet Transfers Goal: LTG Patient will perform toilet transfers w/assist (OT) Description: LTG: Patient will perform toilet transfers with assist, with/without cues using equipment (OT) Flowsheets (Taken 04/14/2023 1314) LTG: Pt will perform toilet transfers with assistance level of: Independent with assistive  device   Problem: RH Tub/Shower Transfers Goal: LTG Patient will perform tub/shower transfers w/assist (OT) Description: LTG: Patient will perform tub/shower transfers with assist, with/without cues using equipment (OT) Flowsheets (Taken 04/14/2023 1314) LTG: Pt will perform tub/shower stall transfers with assistance level of: Independent with assistive device

## 2023-04-14 NOTE — Progress Notes (Signed)
Met with pt to review rehab stay, current medical condition and treatment plan, team conference, and pain management. Discussed bowel and bladder management and medications used. Pt is continent of bowel and bladder. Pt is pleasant and knowledgeable of plan of care.   Marylu Lund, RN

## 2023-04-14 NOTE — Evaluation (Signed)
Occupational Therapy Assessment and Plan  Patient Details  Name: Nathan Berg MRN: [id number] Date of Birth: 08-10-1955  OT Diagnosis: muscle weakness (generalized) Rehab Potential: Rehab Potential (ACUTE ONLY): Good ELOS: 7-10 days   Today's Date: 04/14/2023 OT Individual Time: 7371-0626 OT Individual Time Calculation (min): 79 min     Hospital Problem: Principal Problem:   Thoracic myelopathy Active Problems:   Prostate cancer (HCC)   Compression fracture of T3 vertebra (HCC)   Anemia   Primary hypertension   Past Medical History:  Past Medical History:  Diagnosis Date   Anxiety    Arthritis    Cancer (HCC)    Prostate cancer   Depression    GERD (gastroesophageal reflux disease)    H/O seasonal allergies    History of kidney stones    x1   Prostate cancer (HCC)    Past Surgical History:  Past Surgical History:  Procedure Laterality Date   CHEST TUBE INSERTION  07/16/2019   trauma    COLONOSCOPY     HERNIA REPAIR Bilateral    20 yrs ago   LAMINECTOMY N/A 03/31/2023   Procedure: THORACIC LAMINECTOMY FOR TUMOR;  Surgeon: Donalee Citrin, MD;  Location: [facility] OR;  Service: Neurosurgery;  Laterality: N/A;   LYMPHADENECTOMY Bilateral 09/19/2013   Procedure: LYMPHADENECTOMY  PELVIC LYMPH NODE DISSECTION;  Surgeon: Valetta Fuller, MD;  Location: [facility] ORS;  Service: Urology;  Laterality: Bilateral;   PROSTATE BIOPSY     ROBOT ASSISTED LAPAROSCOPIC RADICAL PROSTATECTOMY N/A 09/19/2013   Procedure: ROBOTIC ASSISTED LAPAROSCOPIC RADICAL PROSTATECTOMY;  Surgeon: Valetta Fuller, MD;  Location: [facility] ORS;  Service: Urology;  Laterality: N/A;   TONSILLECTOMY     child   VARICOCELE EXCISION      Assessment & Plan Clinical Impression: Nathan Berg is a 67 year old male with a history of prostate cancer who had undergone outpatient workup with [facility] on 03/25/2023 with MRI T-spine which showed concern for new metastatic disease involving thoracic spine with concern for cord compression.  He was then referred to the ER. MRI thoracic spine repeated on admission and evaluated by neurosurgery, started on steroids. He was admitted to hospital for further evaluation and treatment. On 9/26 he underwent decompressive laminectomy by Dr. Wynetta Emery. Remains on Decadron. Has history of depression, anxiety, GERD. He had CT simulation done for radiation treatment in the future. He is tolerating his diet. He continues to have the patient requires inpatient medicine and rehabilitation evaluations and services for ongoing functional deficits and decreased mobility.  Patient transferred to CIR on 04/13/2023 .    Patient currently requires min with basic self-care skills secondary to muscle weakness, decreased cardiorespiratoy endurance, and decreased standing balance and decreased balance strategies.  Prior to hospitalization, patient could complete ADLs with independent .  Patient will benefit from skilled intervention to decrease level of assist with basic self-care skills prior to discharge home with care partner.  Anticipate patient will require intermittent supervision and follow up home health.  OT - End of Session Activity Tolerance: Tolerates 30+ min activity with multiple rests Endurance Deficit: Yes Endurance Deficit Description: Pt reports edurance deficit prior d/t cancer medication, although worse now OT Assessment Rehab Potential (ACUTE ONLY): Good OT Patient demonstrates impairments in the following area(s): Balance;Endurance;Motor;Safety OT Basic ADL's Functional Problem(s): Grooming;Bathing;Dressing;Toileting OT Transfers Functional Problem(s): Toilet;Tub/Shower OT Plan OT Intensity: Minimum of 1-2 x/day, 45 to 90 minutes OT Frequency: 5 out of 7 days OT Duration/Estimated Length of Stay: 7-10 days OT Treatment/Interventions: Metallurgist  training;Discharge planning;Self Care/advanced ADL retraining;Therapeutic Activities;UE/LE Coordination activities;Therapeutic  Exercise;Patient/family education;Functional mobility training;Community reintegration;DME/adaptive equipment instruction;Neuromuscular re-education;UE/LE Strength taining/ROM OT Self Feeding Anticipated Outcome(s): mod I OT Basic Self-Care Anticipated Outcome(s): mod I OT Toileting Anticipated Outcome(s): mod I OT Bathroom Transfers Anticipated Outcome(s): mod I OT Recommendation Patient destination: Home Follow Up Recommendations: Home health OT Equipment Recommended: To be determined   OT Evaluation Precautions/Restrictions  Precautions Precautions: Fall;Back Precaution Comments: no brace needed; recalled 3/3 precautions without cue Restrictions Weight Bearing Restrictions: No General Chart Reviewed: Yes Response to Previous Treatment: Not applicable Family/Caregiver Present: No Vital Signs Therapy Vitals Temp: 98.3 F (36.8 C) Pulse Rate: 80 Resp: 18 BP: 129/80 Patient Position (if appropriate): Lying Oxygen Therapy SpO2: 97 % O2 Device: Room Air Pain Pain Assessment Pain Scale: 0-10 Pain Score: 4  Pain Type: Surgical pain Pain Location: Back Pain Orientation: Mid Pain Descriptors / Indicators: Aching Home Living/Prior Functioning Home Living Family/patient expects to be discharged to:: Private residence Living Arrangements: Spouse/significant other Available Help at Discharge: Family, Available 24 hours/day Type of Home: House Home Access: Stairs to enter Secretary/administrator of Steps: 3 Entrance Stairs-Rails: Right, Left (cannot reach both) Home Layout: Multi-level Alternate Level Stairs-Number of Steps: 2 steps for living areas, no rails Alternate Level Stairs-Rails: None Bathroom Shower/Tub: Walk-in shower (with grab bars) Bathroom Toilet: Standard Additional Comments: owns SPC, has access to [employer], TTB  Lives With: Spouse IADL History Homemaking Responsibilities: Yes Meal Prep Responsibility: Secondary Laundry Responsibility: Secondary Cleaning  Responsibility: Secondary Bill Paying/Finance Responsibility: Secondary Shopping Responsibility: Secondary Child Care Responsibility: No Current License: Yes Mode of Transportation: Car Occupation: Retired Leisure and Hobbies: music, concerts, DJ for radio Prior Function Level of Independence: Independent with gait, Independent with transfers  Able to Take Stairs?: Yes Driving: Yes 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 Leisure: Hobbies-yes (Comment) (see above) Vision Baseline Vision/History: 1 Wears glasses (readers) Ability to See in Adequate Light: 0 Adequate Patient Visual Report: No change from baseline Vision Assessment?: No apparent visual deficits Perception  Perception: Within Functional Limits Praxis Praxis: WFL Cognition Cognition Overall Cognitive Status: Within Functional Limits for tasks assessed Arousal/Alertness: Awake/alert Memory: Appears intact Awareness: Appears intact Problem Solving: Appears intact Safety/Judgment: Appears intact Brief Interview for Mental Status (BIMS) Repetition of Three Words (First Attempt): 3 Temporal Orientation: Year: Correct Temporal Orientation: Month: Accurate within 5 days Temporal Orientation: Day: Correct Recall: "Sock": Yes, no cue required Recall: "Blue": Yes, no cue required Recall: "Bed": Yes, no cue required BIMS Summary Score: 15 Sensation Sensation Light Touch: Impaired Detail Peripheral sensation comments: mild deficits from mid thigh down, ~90% normal Light Touch Impaired Details: Impaired RLE;Impaired LLE Hot/Cold: Appears Intact Proprioception: Appears Intact Stereognosis: Not tested Coordination Gross Motor Movements are Fluid and Coordinated: Yes Fine Motor Movements are Fluid and Coordinated: No (intention tremor, reports has this at baseline) Motor  Motor Motor - Skilled Clinical Observations: generalized weakness from tumor removeal at T3  Trunk/Postural Assessment   Cervical Assessment Cervical Assessment: Within Functional Limits Thoracic Assessment Thoracic Assessment:  (thoracic surgery) Lumbar Assessment Lumbar Assessment: Exceptions to [facility] (posterior pelvic tilt) Postural Control Postural Control: Deficits on evaluation (delayed responses)  Balance Balance Balance Assessed: Yes Static Sitting Balance Static Sitting - Balance Support: Feet supported Static Sitting - Level of Assistance: 6: Modified independent (Device/Increase time) Dynamic Sitting Balance Dynamic Sitting - Balance Support: Feet supported;During functional activity Dynamic Sitting - Level of Assistance: 5: Stand by assistance Dynamic Sitting - Balance Activities: Reaching for objects Static Standing  Balance Static Standing - Balance Support: During functional activity;Bilateral upper extremity supported Static Standing - Level of Assistance: 5: Stand by assistance Dynamic Standing Balance Dynamic Standing - Balance Support: During functional activity Dynamic Standing - Level of Assistance: 4: Min assist Extremity/Trunk Assessment RUE Assessment RUE Assessment: Within Functional Limits Active Range of Motion (AROM) Comments: WNL General Strength Comments: 5/5 overall LUE Assessment LUE Assessment: Within Functional Limits Active Range of Motion (AROM) Comments: WFL for movement, although has arthritis in shoulder at baseline General Strength Comments: overall 5/5  Care Tool Care Tool Self Care Eating   Eating Assist Level: Set up assist    Oral Care    Oral Care Assist Level: Set up assist    Bathing   Body parts bathed by patient: Right arm;Left lower leg;Face;Left arm;Chest;Abdomen;Front perineal area;Buttocks;Right upper leg;Left upper leg;Right lower leg     Assist Level: Supervision/Verbal cueing    Upper Body Dressing(including orthotics)   What is the patient wearing?: Pull over shirt        Lower Body Dressing (excluding footwear)     Assist  for lower body dressing: Contact Guard/Touching assist    Putting on/Taking off footwear   What is the patient wearing?: Socks Assist for footwear: Supervision/Verbal cueing       Care Tool Toileting Toileting activity   Assist for toileting: Supervision/Verbal cueing     Care Tool Bed Mobility Roll left and right activity   Roll left and right assist level: Supervision/Verbal cueing    Sit to lying activity   Sit to lying assist level: Supervision/Verbal cueing    Lying to sitting on side of bed activity   Lying to sitting on side of bed assist level: the ability to move from lying on the back to sitting on the side of the bed with no back support.: Supervision/Verbal cueing     Care Tool Transfers Sit to stand transfer   Sit to stand assist level: Contact Guard/Touching assist    Chair/bed transfer   Chair/bed transfer assist level: Contact Guard/Touching assist     Toilet transfer   Assist Level: Contact Guard/Touching assist     Care Tool Cognition  Expression of Ideas and Wants Expression of Ideas and Wants: 4. Without difficulty (complex and basic) - expresses complex messages without difficulty and with speech that is clear and easy to understand  Understanding Verbal and Non-Verbal Content Understanding Verbal and Non-Verbal Content: 4. Understands (complex and basic) - clear comprehension without cues or repetitions   Memory/Recall Ability Memory/Recall Ability : Current season;That he or she is in a hospital/hospital unit   Refer to Care Plan for Long Term Goals  SHORT TERM GOAL WEEK 1 OT Short Term Goal 1 (Week 1): STG=LTG d/t ELOS  Recommendations for other services: Therapeutic Recreation  Pet therapy and Stress management   Skilled Therapeutic Intervention ADL ADL Equipment Provided: Long-handled sponge Eating: Set up Where Assessed-Eating: Chair Grooming: Setup Where Assessed-Grooming: Sitting at sink Upper Body Bathing: Supervision/safety Where  Assessed-Upper Body Bathing: Shower Lower Body Bathing: Supervision/safety Where Assessed-Lower Body Bathing: Shower Upper Body Dressing: Supervision/safety Where Assessed-Upper Body Dressing: Chair Lower Body Dressing: Contact guard Where Assessed-Lower Body Dressing: Sitting at sink;Standing at sink Toileting: Contact guard Where Assessed-Toileting: Teacher, adult education: Furniture conservator/restorer Method: Proofreader: Raised toilet seat Tub/Shower Transfer: Not assessed Film/video editor: Administrator, arts Method: Designer, industrial/product: Grab bars;Transfer tub bench Mobility  Bed Mobility Bed Mobility: Supine to  Sit Rolling Right: Supervision/verbal cueing Rolling Left: Supervision/Verbal cueing Supine to Sit: Contact Guard/Touching assist Transfers Sit to Stand: Contact Guard/Touching assist Stand to Sit: Contact Guard/Touching assist   1:1 evaluation and treatment session initiated this date. OT roles, goals and purpose discussed with pt as well as therapy schedule. ADL completed this date with levels of assist listed above. Pt completed full shower this eval. Pt utilized figure 4 method for donning LD clothing. Pt ambulated household distances with RW during eval with multiple rest breaks d/t low endurance. Pt  when fatigue displays some weakness in legs with scissoring during gait. No LOB/SOB during mobility. Pt would benefit from skilled OT in IPR setting in order to maximize independence with ADLs upon D/C and increase activity tolerance.    Discharge Criteria: Patient will be discharged from OT if patient refuses treatment 3 consecutive times without medical reason, if treatment goals not met, if there is a change in medical status, if patient makes no progress towards goals or if patient is discharged from hospital.  The above assessment, treatment plan, treatment alternatives and goals were discussed and  mutually agreed upon: by patient  Velia Meyer, OTD, OTR/L 04/14/2023, 1:11 PM

## 2023-04-14 NOTE — Progress Notes (Signed)
RN left message for call back to review [facility] PET appointment/discharge plans.

## 2023-04-14 NOTE — Discharge Instructions (Addendum)
Inpatient Rehab Discharge Instructions  Nathan Berg Discharge date and time: 04/30/2023  Activities/Precautions/ Functional Status: Activity: no lifting, driving, or strenuous exercise until cleared by MD Diet: regular diet Wound Care: keep wound clean and dry Functional status:  ___ No restrictions     ___ Walk up steps independently ___ 24/7 supervision/assistance   ___ Walk up steps with assistance _x__ Intermittent supervision/assistance  ___ Bathe/dress independently ___ Walk with walker     ___ Bathe/dress with assistance ___ Walk Independently    ___ Shower independently ___ Walk with assistance    __x_ Shower with assistance __x_ No alcohol     ___ Return to work/school ________  Special Instructions: No driving, alcohol consumption or tobacco use.   COMMUNITY REFERRALS UPON DISCHARGE:    Home Health:   PT      OT        RN    SNA    SW                   Agency: [facility]     Phone: [phone] *Please expect follow-up within 2-3 business days for discharge to schedule your home visit. If you have not received follow-up, be sure to contact the site directly.*   Medical Equipment/Items Ordered:wheelchair and 3in1 bedside commode                                                 Agency/Supplier:[facility] [phone]   My questions have been answered and I understand these instructions. I will adhere to these goals and the provided educational materials after my discharge from the hospital.  Patient/Caregiver Signature _______________________________ Date __________  Clinician Signature _______________________________________ Date __________  Please bring this form and your medication list with you to all your follow-up doctor's appointments.

## 2023-04-15 ENCOUNTER — Other Ambulatory Visit (HOSPITAL_COMMUNITY): Payer: Medicare Other

## 2023-04-15 DIAGNOSIS — M4714 Other spondylosis with myelopathy, thoracic region: Secondary | ICD-10-CM

## 2023-04-15 DIAGNOSIS — M4804 Spinal stenosis, thoracic region: Principal | ICD-10-CM | POA: Diagnosis present

## 2023-04-15 MED ORDER — HYDROMORPHONE HCL 2 MG PO TABS
2.0000 mg | ORAL_TABLET | ORAL | Status: DC | PRN
  Administered 2023-04-15 – 2023-04-18 (×15): 4 mg via ORAL
  Administered 2023-04-18: 2 mg via ORAL
  Administered 2023-04-18 – 2023-04-19 (×3): 4 mg via ORAL
  Filled 2023-04-15 (×21): qty 2

## 2023-04-15 MED ORDER — SORBITOL 70 % SOLN
30.0000 mL | Freq: Once | Status: DC
  Filled 2023-04-15: qty 30

## 2023-04-15 NOTE — Progress Notes (Signed)
Occupational Therapy Session Note  Patient Details  Name: Nathan Berg MRN: [id number] Date of Birth: 1955/10/24  Today's Date: 04/15/2023 OT Individual Time: 1015-1100 OT Individual Time Calculation (min): 45 min    Short Term Goals: Week 1:  OT Short Term Goal 1 (Week 1): STG=LTG d/t ELOS  Skilled Therapeutic Interventions/Progress Updates:    1:1 Pt declined formal self care today.  NMR with focus on dyanmic standing balance, overal conditioning, and cardioendurance. Pt performed 10 sit <> stands from recliner without Ue support. After rest break- ambulated from room to lien cart with contact guard with RW but then fatigued and sat to rest in w/c. Propelled with bilateral feet to the gym. Performed modified OTAG exercises at the parallel bars for UE support/ guide with contact guard.   Performed: hip abduction bilaterally, heel raises, squats, hip extension  Also performed strengthening stepping up and down on 6 inch step but with reps pt's right leg began to buckle more. Pt self propelled back with bilateral Les. Ambulated from sink to bed and got back into bed with min A. Left resting in bed.   Therapy Documentation Precautions:  Precautions Precautions: Fall, Back Precaution Comments: no brace needed; recalled 3/3 precautions without cue Restrictions Weight Bearing Restrictions: No  Pain:  Reports pain around trunk at nipple line- give heat pack to use along left side for comfort.  Therapy/Group: Individual Therapy  [facility] 04/15/2023, 4:15 PM

## 2023-04-15 NOTE — Plan of Care (Signed)
  Problem: Consults Goal: RH SPINAL CORD INJURY PATIENT EDUCATION Description:  See Patient Education module for education specifics.  Outcome: Progressing Goal: Skin Care Protocol Initiated - if Braden Score 18 or less Description: If consults are not indicated, leave blank or document N/A Outcome: Progressing Goal: Nutrition Consult-if indicated Outcome: Progressing Goal: Diabetes Guidelines if Diabetic/Glucose > 140 Description: If diabetic or lab glucose is > 140 mg/dl - Initiate Diabetes/Hyperglycemia Guidelines & Document Interventions  Outcome: Progressing   Problem: SCI BOWEL ELIMINATION Goal: RH STG MANAGE BOWEL WITH ASSISTANCE Description: STG Manage Bowel with Mod I Assistance. Outcome: Progressing Goal: RH STG SCI MANAGE BOWEL WITH MEDICATION WITH ASSISTANCE Description: STG SCI Manage bowel with medication with mod I assistance. Outcome: Progressing Goal: RH STG MANAGE BOWEL W/EQUIPMENT W/ASSISTANCE Description: STG Manage Bowel With Equipment With Mod I Assistance Outcome: Progressing Goal: RH OTHER STG BOWEL ELIMINATION GOALS W/ASSIST Description: Other STG Bowel Elimination Goals With Mod I Assistance. Outcome: Progressing   Problem: SCI BLADDER ELIMINATION Goal: RH STG MANAGE BLADDER WITH ASSISTANCE Description: STG Manage Bladder With Mod I Assistance Outcome: Progressing Goal: RH STG MANAGE BLADDER WITH MEDICATION WITH ASSISTANCE Description: STG Manage Bladder With Medication With Mod I Assistance. Outcome: Progressing Goal: RH OTHER STG BLADDER ELIMINATION GOALS W/ASSIST Description: Other STG Bladder Elimination Goals With Assistance Outcome: Progressing   Problem: RH SKIN INTEGRITY Goal: RH STG SKIN FREE OF INFECTION/BREAKDOWN Outcome: Progressing Goal: RH STG MAINTAIN SKIN INTEGRITY WITH ASSISTANCE Description: STG Maintain Skin Integrity With Assistance. Outcome: Progressing   Problem: RH SAFETY Goal: RH STG ADHERE TO SAFETY PRECAUTIONS  W/ASSISTANCE/DEVICE Description: STG Adhere to Safety Precautions With Assistance/Device. Outcome: Progressing   Problem: RH KNOWLEDGE DEFICIT SCI Goal: RH STG INCREASE KNOWLEDGE OF SELF CARE AFTER SCI Outcome: Progressing   Problem: Education: Goal: Ability to verbalize activity precautions or restrictions will improve Outcome: Progressing Goal: Knowledge of the prescribed therapeutic regimen will improve Outcome: Progressing Goal: Understanding of discharge needs will improve Outcome: Progressing   Problem: Activity: Goal: Ability to avoid complications of mobility impairment will improve Outcome: Progressing Goal: Ability to tolerate increased activity will improve Outcome: Progressing Goal: Will remain free from falls Outcome: Progressing   Problem: Bowel/Gastric: Goal: Gastrointestinal status for postoperative course will improve Outcome: Progressing   Problem: Clinical Measurements: Goal: Ability to maintain clinical measurements within normal limits will improve Outcome: Progressing Goal: Postoperative complications will be avoided or minimized Outcome: Progressing Goal: Diagnostic test results will improve Outcome: Progressing   Problem: Pain Management: Goal: Pain level will decrease Outcome: Progressing   Problem: Health Behavior/Discharge Planning: Goal: Identification of resources available to assist in meeting health care needs will improve Outcome: Progressing   Problem: Skin Integrity: Goal: Will show signs of wound healing Outcome: Progressing   Problem: Bladder/Genitourinary: Goal: Urinary functional status for postoperative course will improve Outcome: Progressing

## 2023-04-15 NOTE — Progress Notes (Signed)
Physical Therapy Session Note  Patient Details  Name: Nathan Berg MRN: [id number] Date of Birth: 1956-06-11  Today's Date: 04/15/2023 PT Individual Time: 7829-5621 PT Individual Time Calculation (min): 40 min   Short Term Goals: Week 1:  PT Short Term Goal 1 (Week 1): Pt will ambulate >100 ft with RW PT Short Term Goal 2 (Week 1): Pt will navigate stairs per home environment with assist PT Short Term Goal 3 (Week 1): Pt will sit OOB for whole day of therapy.  Skilled Therapeutic Interventions/Progress Updates:    Pt presents in room in bathroom, handoff from nurse tech, agreeable to PT. Pt denies pain at this time and reports that he has not had to take pain medication since this morning, reports excited about this. Session focused on gait training for tolerance to upright as well as NMR for dynamic standing balance and BLE coordination.  Pt completes periarea hygiene in sitting with supervision, completes LB dressing in standing with supervision and RW. Pt ambulates with CGA to sink to complete hand hygiene and returns to sitting on WC. Pt transported to gym via WC dependently for time management and energy conservation. Pt ambulates with RW 190' with CGA, demonstrates increased coordination deficits initially but decreased with time upright and distance ambulated with pt initially demonstrating excessive step length and scissoring most significantly with turns. Pt ambulates forward/backwards 3x10' with RW CGA with verbal cues for foot placement and sequencing, improves with each trial, completed to promote multidirectional stepping stability and BLE coordination for multidirectional movement.  Pt then completes NMR for BLE coordination in standing as well as dynamic standing balance including: -cone taps x10 BLE with BUE support on RW (CGA) - forward step and back 2x10 BLE (CGA to light min assist for blocking stance knee with task, no UE support however completed in //bars with pt  occasionally utilizing unialteral support for comfort)  Pt ambulates back to room with RW CGA, pt demonstrates increased fatigue, NBOS with occasional scissoring, and excessive step length however able to correct with verbal cues.  Pt returns to room and remains seated in recliner with all needs within reach, cal light in place and chair alarm donned and activated at end of session.   Therapy Documentation Precautions:  Precautions Precautions: Fall, Back Precaution Comments: no brace needed; recalled 3/3 precautions without cue Restrictions Weight Bearing Restrictions: No    Therapy/Group: Individual Therapy  Edwin Cap PT, DPT 04/15/2023, 5:13 PM

## 2023-04-15 NOTE — Evaluation (Signed)
Recreational Therapy Assessment and Plan  Patient Details  Name: Nathan Berg MRN: [id number] Date of Birth: [birth year] Today's Date: 04/15/2023  Rehab Potential:  Good ELOS:   7-10 days  Assessment  Hospital Problem: Principal Problem:   Thoracic myelopathy Active Problems:   Prostate cancer (HCC)   Compression fracture of T3 vertebra (HCC)   Anemia   Primary hypertension     Past Medical History:      Past Medical History:  Diagnosis Date   Anxiety     Arthritis     Cancer (HCC)      Prostate cancer   Depression     GERD (gastroesophageal reflux disease)     H/O seasonal allergies     History of kidney stones      x1   Prostate cancer (HCC)          Past Surgical History:       Past Surgical History:  Procedure Laterality Date   CHEST TUBE INSERTION   07/16/2019    trauma    COLONOSCOPY       HERNIA REPAIR Bilateral      20 yrs ago   LAMINECTOMY N/A 03/31/2023    Procedure: THORACIC LAMINECTOMY FOR TUMOR;  Surgeon: Donalee Citrin, MD;  Location: [facility] OR;  Service: Neurosurgery;  Laterality: N/A;   LYMPHADENECTOMY Bilateral 09/19/2013    Procedure: LYMPHADENECTOMY  PELVIC LYMPH NODE DISSECTION;  Surgeon: Valetta Fuller, MD;  Location: [facility] ORS;  Service: Urology;  Laterality: Bilateral;   PROSTATE BIOPSY       ROBOT ASSISTED LAPAROSCOPIC RADICAL PROSTATECTOMY N/A 09/19/2013    Procedure: ROBOTIC ASSISTED LAPAROSCOPIC RADICAL PROSTATECTOMY;  Surgeon: Valetta Fuller, MD;  Location: [facility] ORS;  Service: Urology;  Laterality: N/A;   TONSILLECTOMY        child   VARICOCELE EXCISION              Assessment & Plan Clinical Impression: Nathan Berg is a 67 year old male with a history of prostate cancer who had undergone outpatient workup with [facility] on 03/25/2023 with MRI T-spine which showed concern for new metastatic disease involving thoracic spine with concern for cord compression. He was then referred to the ER. MRI thoracic spine repeated on admission and evaluated  by neurosurgery, started on steroids. He was admitted to hospital for further evaluation and treatment. On 9/26 he underwent decompressive laminectomy by Dr. Wynetta Emery. Remains on Decadron. Has history of depression, anxiety, GERD. He had CT simulation done for radiation treatment in the future. He is tolerating his diet. He continues to have the patient requires inpatient medicine and rehabilitation evaluations and services for ongoing functional deficits and decreased mobility.  Patient transferred to CIR on 04/13/2023 .     Pt presents with decreased activity tolerance, decreased functional mobility, decreased balance, feeling of stress Limiting pt's independence with leisure/community pursuits.  Met with pt today to discuss TR services including leisure education, activity analysis/modifications and stress management.  Also discussed the importance of social, emotional, spiritual health in addition to physical health and their effects on overall health and wellness.  Pt stated understanding.   Plan  Min 1 TR session >20 minutes during LOS  Recommendations for other services: Neuropsych  Discharge Criteria: Patient will be discharged from TR if patient refuses treatment 3 consecutive times without medical reason.  If treatment goals not met, if there is a change in medical status, if patient makes no progress towards goals or if patient is discharged from  hospital.  The above assessment, treatment plan, treatment alternatives and goals were discussed and mutually agreed upon: by patient  Nathan Berg 04/15/2023, 8:47 AM

## 2023-04-15 NOTE — IPOC Note (Signed)
Overall Plan of Care [facility]) Patient Details Name: Nathan Berg MRN: [id number] DOB: [birth year]  Admitting Diagnosis: Thoracic myelopathy  Hospital Problems: Principal Problem:   Thoracic myelopathy Active Problems:   Prostate cancer (HCC)   Compression fracture of T3 vertebra (HCC)   Anemia   Primary hypertension     Functional Problem List: Nursing Bladder, Bowel, Medication Management, Motor, Pain, Perception  PT Balance, Pain, Safety, Endurance, Sensory, Motor, Skin Integrity  OT Balance, Endurance, Motor, Safety  SLP    TR         Basic ADL's: OT Grooming, Bathing, Dressing, Toileting     Advanced  ADL's: OT       Transfers: PT Bed Mobility, Bed to Chair, Customer service manager, Tub/Shower     Locomotion: PT Psychologist, prison and probation services, Ambulation, Stairs     Additional Impairments: OT    SLP        TR      Anticipated Outcomes Item Anticipated Outcome  Self Feeding mod I  Swallowing      Basic self-care  mod I  Toileting  mod I   Bathroom Transfers mod I  Bowel/Bladder  Manage Bowel and bladder with mod I assist  Transfers  supervision-mod I  Locomotion  supervision with LRAD  Communication     Cognition     Pain  Pain level at or below 5/10  Safety/Judgment  Remain free of injury, prevent falls with cues and reminders.   Therapy Plan: PT Intensity: Minimum of 1-2 x/day ,45 to 90 minutes PT Frequency: 5 out of 7 days PT Duration Estimated Length of Stay: 10-12 days OT Intensity: Minimum of 1-2 x/day, 45 to 90 minutes OT Frequency: 5 out of 7 days OT Duration/Estimated Length of Stay: 7-10 days     Team Interventions: Nursing Interventions Patient/Family Education, Pain Management, Bladder Management, Medication Management, Discharge Planning, Psychosocial Support, Bowel Management, Disease Management/Prevention, Cognitive Remediation/Compensation  PT interventions Ambulation/gait training, Discharge planning, DME/adaptive equipment instruction,  Functional mobility training, Pain management, Psychosocial support, Splinting/orthotics, Therapeutic Activities, UE/LE Strength taining/ROM, Wheelchair propulsion/positioning, UE/LE Coordination activities, Therapeutic Exercise, Stair training, Skin care/wound management, Patient/family education, Neuromuscular re-education, Disease management/prevention, Firefighter, Warden/ranger  OT Interventions Warden/ranger, Discharge planning, Self Care/advanced ADL retraining, Therapeutic Activities, UE/LE Coordination activities, Therapeutic Exercise, Patient/family education, Functional mobility training, Community reintegration, Fish farm manager, Neuromuscular re-education, UE/LE Strength taining/ROM  SLP Interventions    TR Interventions    SW/CM Interventions Discharge Planning, Patient/Family Education, Psychosocial Support   Barriers to Discharge MD  Medical stability, Home enviroment access/loayout, Wound care, Lack of/limited family support, Weight, Weight bearing restrictions, and Pending chemo/radiation  Nursing      PT Inaccessible home environment, Home environment access/layout, Pending chemo/radiation, Neurogenic Bowel & Bladder, Wound Care    OT      SLP      SW Decreased caregiver support, Lack of/limited family support     Team Discharge Planning: Destination: PT-  ,OT- Home , SLP-  Projected Follow-up: PT-Outpatient PT, Home health PT, Other (comment) (Outpatient appropriate if transportation and radiation treatment allows.), OT-  Home health OT, SLP-  Projected Equipment Needs: PT-Rolling walker with 5" wheels, OT- To be determined, SLP-  Equipment Details: PT- , OT-  Patient/family involved in discharge planning: PT- Patient,  OT-Patient, SLP-   MD ELOS: 7-10 days  Medical Rehab Prognosis:  Good Assessment: The patient has been admitted for CIR therapies with the diagnosis of Thoracic fracture, and prostate CA mets.  The team  will be addressing functional mobility, strength, stamina, balance, safety, adaptive techniques and equipment, self-care, bowel and bladder mgt, patient and caregiver education, . Goals have been set at mod I to supervision. Anticipated discharge destination is home with family.        See Team Conference Notes for weekly updates to the plan of care

## 2023-04-15 NOTE — Progress Notes (Signed)
Patient ID: Nathan Berg, male   DOB: 01-Jan-1956, 67 y.o.   MRN: [id number]  SW spoke with [facility] to schedule: 10/15 appt at 10:30am; pick up at 9:45am 10/17 appt at 3:15pm, pick up at 2:30pm 10/21 appt at 2:50pm; pick up at 2:15pm 10/23 appt at 12:30pm; pick up at 1145am. *Appt for 10/23 was scheduled if pt remains here. Based on his ELOS of 7-12 days, not likely, and will need to cancel. If he is here beyond 10/25, will need to schedule transportation for this date only.   [id number] left message for [name] to introduce self, explain role, discuss discharge process, inform on ELOS, confirm d/c plan, inform on transportation arranged for cancer tx, and discuss family edu. SW waiting on follow-up.   Cecile Sheerer, MSW, LCSWA Office: [phone] Cell: [phone] Fax: [phone]

## 2023-04-15 NOTE — Progress Notes (Addendum)
PROGRESS NOTE   Subjective/Complaints:  Pt reports not doing great this AM- got pain Pt reports LBM 3 days ago- feels very constipated- no results with Sorbitol 15cc yesterday- willing to try higher dose.   Hurting all over after therapy- oxy just not doing anything for him.  Arms/lats hurting from RW and rolling w/c.  Radiation next week per chart.    ROS:   Pt denies SOB, abd pain, CP, N/V/ (+)C/D, and vision changes  Pain limiting (+)  Objective:   No results found. Recent Labs    04/14/23 0631  WBC 7.0  HGB 9.9*  HCT 30.3*  PLT 360   Recent Labs    04/14/23 0631  NA 137  K 5.0  CL 101  CO2 28  GLUCOSE 110*  BUN 19  CREATININE 0.83  CALCIUM 8.8*    Intake/Output Summary (Last 24 hours) at 04/15/2023 0832 Last data filed at 04/15/2023 0745 Gross per 24 hour  Intake 720 ml  Output 1250 ml  Net -530 ml        Physical Exam: Vital Signs Blood pressure 136/89, pulse 68, temperature 97.6 F (36.4 C), temperature source Oral, resp. rate 18, height 5\' 10"  (1.778 m), weight 86.9 kg, SpO2 97%.     General: awake, alert, appropriate, sitting up in bedside chair; finished tray;  NAD HENT: conjugate gaze; oropharynx moist CV: regular rate and rhythm; no JVD Pulmonary: CTA B/L; no W/R/R- good air movement GI: soft, NT, ND, (+)BS- slightly hypoactive Psychiatric: appropriate- interactive Neurological: Ox3 Skin- posterior Cervical spine incision- steristrips intact Neuro:  Alert and oriented x 3, follows commands, CN 2-12 intact, no speech or language deficits noted, good insight and awareness RUE: 5/5 Deltoid, 5/5 Biceps, 5/5 Triceps, 5/5 Wrist Ext, 5/5 Grip LUE: 4/5 Deltoid, 5/5 Biceps, 5/5 Triceps, 5/5 Wrist Ext, 5/5 Grip RLE: HF 5/5, KE 5/5, ADF 5/5, APF 5/5 LLE: HF 5/5, KE 5/5,  ADF 5/5, APF 5/5 Sensory exam normal for light touch and pain in all 4 limbs. No limb ataxia or cerebellar signs. No  abnormal tone appreciated.  No ankle clonus Musculoskeletal: L shoulder pain and crepitus with PROM, no significant L shoulder tenderness   .  Assessment/Plan: 1. Functional deficits which require 3+ hours per day of interdisciplinary therapy in a comprehensive inpatient rehab setting. Physiatrist is providing close team supervision and 24 hour management of active medical problems listed below. Physiatrist and rehab team continue to assess barriers to discharge/monitor patient progress toward functional and medical goals  Care Tool:  Bathing    Body parts bathed by patient: Right arm, Left lower leg, Face, Left arm, Chest, Abdomen, Front perineal area, Buttocks, Right upper leg, Left upper leg, Right lower leg         Bathing assist Assist Level: Supervision/Verbal cueing     Upper Body Dressing/Undressing Upper body dressing   What is the patient wearing?: Pull over shirt    Upper body assist      Lower Body Dressing/Undressing Lower body dressing            Lower body assist Assist for lower body dressing: Contact Guard/Touching assist     Toileting Toileting  Toileting assist Assist for toileting: Supervision/Verbal cueing     Transfers Chair/bed transfer  Transfers assist     Chair/bed transfer assist level: Minimal Assistance - Patient > 75%     Locomotion Ambulation   Ambulation assist      Assist level: Minimal Assistance - Patient > 75% Assistive device: Walker-rolling Max distance: 84 ft   Walk 10 feet activity   Assist     Assist level: Minimal Assistance - Patient > 75% Assistive device: Walker-rolling   Walk 50 feet activity   Assist    Assist level: Minimal Assistance - Patient > 75% Assistive device: Walker-rolling    Walk 150 feet activity   Assist Walk 150 feet activity did not occur: Safety/medical concerns         Walk 10 feet on uneven surface  activity   Assist Walk 10 feet on uneven surfaces  activity did not occur: Safety/medical concerns         Wheelchair     Assist Is the patient using a wheelchair?: Yes Type of Wheelchair: Manual    Wheelchair assist level: Supervision/Verbal cueing Max wheelchair distance: 150 ft    Wheelchair 50 feet with 2 turns activity    Assist        Assist Level: Supervision/Verbal cueing   Wheelchair 150 feet activity     Assist      Assist Level: Supervision/Verbal cueing   Blood pressure 136/89, pulse 68, temperature 97.6 F (36.4 C), temperature source Oral, resp. rate 18, height 5\' 10"  (1.778 m), weight 86.9 kg, SpO2 97%.  Medical Problem List and Plan: 1. 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              -Plan for radiation treatments week of 10/14             -patient may shower             -ELOS/Goals: 10-12, PT/OT Supervision              -Admit to CIR   Con't CIR PT and OT  IPOC today  2.  Antithrombotics: -DVT/anticoagulation:  Mechanical:  Antiembolism stockings, knee (TED hose) Bilateral lower extremities. Awaiting call back from NS regarding initiating pharmacologic DVT prophylaxis             -antiplatelet therapy: none   3. Pain Management: Tylenol as needed             -continue Flexeril 10 mg TID prn             -continue Duragesic patch 12/mcg/hr q 72 hours -continue gabapentin 300 mg TID             -continue Lidoderm              -continue oxycodone 10 mg q 4 hrs prn             -continue ibuprofen 400 mg q 6 hrs prn   10/10- will change Duragesic to 25 mcg q3 days as well as reduce Oxy to 5 mg q4 hours prn- since feels like patch more effective than Oxy.   10/11- pain slightly better, but Oxy doesn't work- will change Oxy to Dilaudid 2-4 mg q4 hours prn and maintain Duragesic patch 25 mcg- pt happy with change.  4. Mood/Behavior/Sleep: LCSW to evaluate and  provide emotional support             -  History of GAD             -continue Lexapro 30 mg daily             -continue Seroquel 200 mg q HS             -continue trazodone 25 mg q HS             -continue Xanax 0.25 mg BID prn             -continue melatonin 3 mg q HS prn              10/10-10/11- slept OK 5. Neuropsych/cognition: This patient is capable of making decisions on his own behalf.   6. Skin/Wound Care: Routine skin care checks             -monitor incision>>keep the Steri-Strips dry during showers per Dr. Wynetta Emery   7. Fluids/Electrolytes/Nutrition: Routine Is and Os and follow-up chemistries             -continue Mag-ox 400 mg BID             -continue B12 1000 mcg daily             -Hypokalemia/Hyponatremia- Recheck labs tomorrow   10/10- K+ 5.0- and Na 137- had been hypokalemic- so likely hemolysis- wil recheck Monday 8: Hypertension: controlled, monitor TID and prn   9: GERD: continue Protonix   10: Current History of prostate cancer with mets to spine s/p decompressive laminectomy T3 and evacuation of epidural tumor Dr. Wynetta Emery 09/26             -continue Xtandi             -continue Decadron             -no brace needed; no twisting, arching, bending             -follow-up with Dr. Wynetta Emery, Dr. Mosetta Putt and Dr. Kathrynn Running   10/11- starting radiation next week per notes-  11: Anemia: multifactorial             -follow-up CBC   10/10- Hb 9.9 a little better 12. Constipation  10/10- will try Sorbitol 15cc- since doesn't want to blow out- since LBM 2 days ago- and feels constipated.  10/11- didn't have BM- will order Sorbitol 30cc and pt to take prune juice at lunch- ordered at 4pm.      I spent a total of 42   minutes on total care today- >50% coordination of care- due to  IPOC, discussion with pt about pain meds- review of therapy and Oncology notes and vitals-    LOS: 2 days A FACE TO FACE EVALUATION WAS PERFORMED  Nahla Lukin 04/15/2023, 8:32 AM

## 2023-04-15 NOTE — Care Management (Signed)
Inpatient Rehabilitation Center Individual Statement of Services  Patient Name:  Nathan Berg  Date:  04/15/2023  Welcome to the Inpatient Rehabilitation Center.  Our goal is to provide you with an individualized program based on your diagnosis and situation, designed to meet your specific needs.  With this comprehensive rehabilitation program, you will be expected to participate in at least 3 hours of rehabilitation therapies Monday-Friday, with modified therapy programming on the weekends.  Your rehabilitation program will include the following services:  Physical Therapy (PT), Occupational Therapy (OT), 24 hour per day rehabilitation nursing, Therapeutic Recreaction (TR), Psychology, Neuropsychology, Care Coordinator, Rehabilitation Medicine, Nutrition Services, Pharmacy Services, and Other  Weekly team conferences will be held on Tuesdays to discuss your progress.  Your Inpatient Rehabilitation Care Coordinator will talk with you frequently to get your input and to update you on team discussions.  Team conferences with you and your family in attendance may also be held.  Expected length of stay: 7-12 days  Overall anticipated outcome: Independent with Assistive Device  Depending on your progress and recovery, your program may change. Your Inpatient Rehabilitation Care Coordinator will coordinate services and will keep you informed of any changes. Your Inpatient Rehabilitation Care Coordinator's name and contact numbers are listed  below.  The following services may also be recommended but are not provided by the Inpatient Rehabilitation Center:  Driving Evaluations Home Health Rehabiltiation Services Outpatient Rehabilitation Services Vocational Rehabilitation   Arrangements will be made to provide these services after discharge if needed.  Arrangements include referral to agencies that provide these services.  Your insurance has been verified to be:  Medicare A/B  Your primary  doctor is:  Malachy Mood  Pertinent information will be shared with your doctor and your insurance company.  Inpatient Rehabilitation Care Coordinator:  Susie Cassette [phone] or ([phone]  Information discussed with and copy given to patient by: Gretchen Short, 04/15/2023, 9:55 AM

## 2023-04-15 NOTE — Progress Notes (Signed)
Physical Therapy Session Note  Patient Details  Name: Nathan Berg MRN: [id number] Date of Birth: 10/05/1955  Today's Date: 04/15/2023 PT Individual Time: 1117-1205 PT Individual Time Calculation (min): 48 min   Short Term Goals: Week 1:  PT Short Term Goal 1 (Week 1): Pt will ambulate >100 ft with RW PT Short Term Goal 2 (Week 1): Pt will navigate stairs per home environment with assist PT Short Term Goal 3 (Week 1): Pt will sit OOB for whole day of therapy.   Skilled Therapeutic Interventions/Progress Updates:  Patient supine in bed on entrance to room. Patient alert and agreeable to PT session.   Patient with 3/10 pain complaint at start of session that he relates can increase up to 6/10 with mobility.   Therapeutic Activity: Bed Mobility: Pt performed supine > sit with supervision. No cueing required for technique. Transfers: Pt performed sit<>stand and stand pivot transfers throughout session with CGA initially and then improves to supervision with reduced arm use. Provided verbal cues for forward scoot, anterior weight shift using hip hinge.   Gait Training:  Pt ambulated 200 ft using RW with CGA throughout with close w/c follow. Demonstrated flexed forward posture, decreased but adequate foot clearance with Bil flexed knees throughout. Provided vc/ tc for uptight posture and level gaze.  Neuromuscular Re-ed: NMR facilitated during session with focus on standing balance and motor control. Pt guided in sit<>stand with 1# weight held in BUE reach to top of mirror upon rise to stand. Requires CGA initially and then is able to progress to close supervision completing 2x10. Fatigue noted in BLE. Requires seated rest break following each bout. NMR performed for improvements in motor control and coordination, balance, sequencing, judgement, and self confidence/ efficacy in performing all aspects of mobility at highest level of independence.   Patient supine in bed at end of session with  brakes locked, bed alarm set, and all needs within reach.   Therapy Documentation Precautions:  Precautions Precautions: Fall, Back Precaution Comments: no brace needed; recalled 3/3 precautions without cue Restrictions Weight Bearing Restrictions: No  Pain:  Pain increases to 6/10 with mobility during session but is addressed with repositioning for seated rest breaks throughout.  Therapy/Group: Individual Therapy  Loel Dubonnet PT, DPT, CSRS 04/15/2023, 12:38 PM

## 2023-04-15 NOTE — Progress Notes (Signed)
Physical Therapy Session Note  Patient Details  Name: Nathan Berg MRN: [id number] Date of Birth: Nov 08, 1955  Today's Date: 04/15/2023 PT Individual Time: 0805-0900 PT Individual Time Calculation (min): 55 min   Short Term Goals: Week 1:  PT Short Term Goal 1 (Week 1): Pt will ambulate >100 ft with RW PT Short Term Goal 2 (Week 1): Pt will navigate stairs per home environment with assist PT Short Term Goal 3 (Week 1): Pt will sit OOB for whole day of therapy.  Skilled Therapeutic Interventions/Progress Updates: Pt presented at toilet with NT and RN present agreeable to therapy. Pt states pain 5/10 in back, received pain meds from RN at start of session. Pt was able to stand from toilet with CGA and ambulated to w/c with CGA and mild knee instability noted. Pt then completed oral hygiene at w/c level mod I. Pt then transported to day room and participated in gait bouts for endurance at 23ft x 4 with brief seated rest breaks between bouts. Pt noted initially to keep knees flexed and noted mild scissoring while performing turns, however demonstrated improved technique with each bout and improved management of RW. Pt then ambulated an additional 62ft to NuStep. Participated in NuStep x 11 min L3 using x 4 extremities x 6 min then BLE only x3:30 with final minute L1 x 4 extremities. Once completed performed ambulatory transfer to w/c with RW and minA and transported back to room. Pt remained in w/c at end of session as hand off to [place], RT with current needs met.      Therapy Documentation Precautions:  Precautions Precautions: Fall, Back Precaution Comments: no brace needed; recalled 3/3 precautions without cue Restrictions Weight Bearing Restrictions: No General:   Vital Signs: Therapy Vitals Temp: 98.9 F (37.2 C) Temp Source: Oral Pulse Rate: 81 Resp: 16 BP: (!) 144/74 Patient Position (if appropriate): Lying Oxygen Therapy SpO2: 98 % O2 Device: Room Air  Therapy/Group:  Individual Therapy  Jahlil Ziller 04/15/2023, 4:11 PM

## 2023-04-16 DIAGNOSIS — I10 Essential (primary) hypertension: Secondary | ICD-10-CM | POA: Diagnosis not present

## 2023-04-16 DIAGNOSIS — R011 Cardiac murmur, unspecified: Secondary | ICD-10-CM

## 2023-04-16 DIAGNOSIS — M4804 Spinal stenosis, thoracic region: Secondary | ICD-10-CM

## 2023-04-16 NOTE — Progress Notes (Signed)
Physical Therapy Session Note  Patient Details  Name: Nathan Berg MRN: [id number] Date of Birth: 1955-07-14  Today's Date: 04/16/2023 PT Individual Time: 1000-1115 PT Individual Time Calculation (min): 75 min   Short Term Goals: Week 1:  PT Short Term Goal 1 (Week 1): Pt will ambulate >100 ft with RW PT Short Term Goal 2 (Week 1): Pt will navigate stairs per home environment with assist PT Short Term Goal 3 (Week 1): Pt will sit OOB for whole day of therapy.  Skilled Therapeutic Interventions/Progress Updates:    Pt received in recliner and agreeable to therapy.  Pt reports "not much at all" pain, required no intervention during session.   Stand pivot transfer with CGA and RW, cues throughout session for RW safety and proximity.  Session focused on interventions for strength and ataxia management. This am note LLE ataxia >than RLE. Pt reports he feels this may be new, but unsure and maybe just more noticeable.   Pt performed step taps on 4" steps, added 1.5lb ankle weights and demoed large improvement in ataxia.  Progressed to using pattern on yoga blocks and command following for using heel vs toe for improved reaction time with good improvement with repetition.  Pt then participated in obstacle course including the following: -stepping over yoga blocks x 2 -weaving through 4 cones -up/down ramp -around final cone Pt required min a at times for knee instability, but no true LOB. Do note LLE tendency toward external rotation and intermittent scissoring, mod improvement   Pt participated in half kneel >standing with knee on ~12" platform ,required +2 for HHA on 1 side (mat table on the other) and at hips for safety. Pt performed x 10 BIL in preparation for fall recovery if neccessary.   Pt then performed 2 x 12 mini squats with CGA for improved knee stability. Cueing for appropriate knee alignment.    Returned to recliner and was left with all needs in reach and alarm active.    Therapy Documentation Precautions:  Precautions Precautions: Fall, Back Precaution Comments: no brace needed; recalled 3/3 precautions without cue Restrictions Weight Bearing Restrictions: No General:       Therapy/Group: Individual Therapy  Juluis Rainier 04/16/2023, 10:30 AM

## 2023-04-16 NOTE — Plan of Care (Signed)
  Problem: Consults Goal: RH SPINAL CORD INJURY PATIENT EDUCATION Description:  See Patient Education module for education specifics.  Outcome: Progressing Goal: Skin Care Protocol Initiated - if Braden Score 18 or less Description: If consults are not indicated, leave blank or document N/A Outcome: Progressing Goal: Nutrition Consult-if indicated Outcome: Progressing Goal: Diabetes Guidelines if Diabetic/Glucose > 140 Description: If diabetic or lab glucose is > 140 mg/dl - Initiate Diabetes/Hyperglycemia Guidelines & Document Interventions  Outcome: Progressing   Problem: SCI BOWEL ELIMINATION Goal: RH STG MANAGE BOWEL WITH ASSISTANCE Description: STG Manage Bowel with Mod I Assistance. Outcome: Progressing Goal: RH STG SCI MANAGE BOWEL WITH MEDICATION WITH ASSISTANCE Description: STG SCI Manage bowel with medication with mod I assistance. Outcome: Progressing Goal: RH STG MANAGE BOWEL W/EQUIPMENT W/ASSISTANCE Description: STG Manage Bowel With Equipment With Mod I Assistance Outcome: Progressing Goal: RH OTHER STG BOWEL ELIMINATION GOALS W/ASSIST Description: Other STG Bowel Elimination Goals With Mod I Assistance. Outcome: Progressing   Problem: SCI BLADDER ELIMINATION Goal: RH STG MANAGE BLADDER WITH ASSISTANCE Description: STG Manage Bladder With Mod I Assistance Outcome: Progressing Goal: RH STG MANAGE BLADDER WITH MEDICATION WITH ASSISTANCE Description: STG Manage Bladder With Medication With Mod I Assistance. Outcome: Progressing Goal: RH OTHER STG BLADDER ELIMINATION GOALS W/ASSIST Description: Other STG Bladder Elimination Goals With Assistance Outcome: Progressing   Problem: RH SKIN INTEGRITY Goal: RH STG SKIN FREE OF INFECTION/BREAKDOWN Outcome: Progressing Goal: RH STG MAINTAIN SKIN INTEGRITY WITH ASSISTANCE Description: STG Maintain Skin Integrity With Assistance. Outcome: Progressing   Problem: RH SAFETY Goal: RH STG ADHERE TO SAFETY PRECAUTIONS  W/ASSISTANCE/DEVICE Description: STG Adhere to Safety Precautions With Assistance/Device. Outcome: Progressing   Problem: RH KNOWLEDGE DEFICIT SCI Goal: RH STG INCREASE KNOWLEDGE OF SELF CARE AFTER SCI Outcome: Progressing   Problem: Education: Goal: Ability to verbalize activity precautions or restrictions will improve Outcome: Progressing Goal: Knowledge of the prescribed therapeutic regimen will improve Outcome: Progressing Goal: Understanding of discharge needs will improve Outcome: Progressing   Problem: Activity: Goal: Ability to avoid complications of mobility impairment will improve Outcome: Progressing Goal: Ability to tolerate increased activity will improve Outcome: Progressing Goal: Will remain free from falls Outcome: Progressing   Problem: Bowel/Gastric: Goal: Gastrointestinal status for postoperative course will improve Outcome: Progressing   Problem: Clinical Measurements: Goal: Ability to maintain clinical measurements within normal limits will improve Outcome: Progressing Goal: Postoperative complications will be avoided or minimized Outcome: Progressing Goal: Diagnostic test results will improve Outcome: Progressing   Problem: Pain Management: Goal: Pain level will decrease Outcome: Progressing   Problem: Skin Integrity: Goal: Will show signs of wound healing Outcome: Progressing   Problem: Health Behavior/Discharge Planning: Goal: Identification of resources available to assist in meeting health care needs will improve Outcome: Progressing   Problem: Bladder/Genitourinary: Goal: Urinary functional status for postoperative course will improve Outcome: Progressing

## 2023-04-16 NOTE — Progress Notes (Signed)
Physical Therapy Session Note  Patient Details  Name: Nathan Berg MRN: [id number] Date of Birth: 1955-09-20  Today's Date: 04/16/2023 PT Individual Time: 0850-0936 PT Individual Time Calculation (min): 46 min   Short Term Goals: Week 1:  PT Short Term Goal 1 (Week 1): Pt will ambulate >100 ft with RW PT Short Term Goal 2 (Week 1): Pt will navigate stairs per home environment with assist PT Short Term Goal 3 (Week 1): Pt will sit OOB for whole day of therapy.  Skilled Therapeutic Interventions/Progress Updates: Patient sitting in recliner with PA present on entrance to room. Patient alert and agreeable to PT session.   Patient reported no pain at beginning of session. When asked what pt wanted to do today that has helped with physical movement, pt reported the NuStep.  Therapeutic Activity: Transfers: Pt performed sit<>stand transfers throughout session with close supervision. No VC required this session.  Gait Training:  Pt ambulated around day room loop and nsg station using RW with CGA/minA. Pt demonstrated the following gait deviations with therapist providing the described cuing and facilitation for improvement:  - B LE's in slight knee flexion - Pt self cued to perform heel-strike - Pt with increased step length throughout with questioning cues at end of trial for pt to self assess (pt noted that previous therapists have brought that to his awareness).  Neuromuscular Re-ed: NMR facilitated during session with focus on dynamic standing balance and coordination. - Pt performed foot taps to 4" step with R HHA provided (minA required per pt presentation of increased lateral lean to stance side). Pt able to tap with R LE with better effort vs L LE. L LE presented with decreased coordination, especially when cued to step back to neutral stance (pt would extend/abduct at L hip, bring extremity behind other extremity and to the side). Pt also presented with requiring increased effort to  initiate movement with pt self noting. - Pt performed taps with L LE while standing and R HHA provided. PTA demonstrated intervention with pt cued to maintain neutral stance with visual tape target on floor, and then to tap to colored dot on floor with L LE only. Pt required min/heavy minA to maintain upright standing balance, and presented with ability to step to and from, but would required some adjustments to correct neutral stance. Pt also presented with increased initiation this intervention vs previous.  NMR performed for improvements in motor control and coordination, balance, sequencing, judgement, and self confidence/ efficacy in performing all aspects of mobility at highest level of independence.   Therapeutic Exercise: Pt performed the following exercise to increase cardiovascular endurance and proprioceptive feedback on B LE's  - 10 minutes on Nustep on level 7 maintaining 35-40 steps per minute. 370 steps  Patient sitting in recliner at end of session with brakes locked, chair alarm set, and all needs within reach.      Therapy Documentation Precautions:  Precautions Precautions: Fall, Back Precaution Comments: no brace needed; recalled 3/3 precautions without cue Restrictions Weight Bearing Restrictions: No  Therapy/Group: Individual Therapy  Consepcion Utt PTA 04/16/2023, 1:46 PM

## 2023-04-16 NOTE — Progress Notes (Signed)
Occupational Therapy Session Note  Patient Details  Name: Nathan Berg MRN: [id number] Date of Birth: 30-Sep-1955  Today's Date: 04/16/2023 OT Individual Time: 6283-1517 OT Individual Time Calculation (min): 73 min    Short Term Goals: Week 1:  OT Short Term Goal 1 (Week 1): STG=LTG d/t ELOS  Skilled Therapeutic Interventions/Progress Updates: Patient received sitting up in recliner. Motivated to participate. Patient requesting to go to the bathroom, ambulated with RW SBA into bathroom. No assist needed for clothing management or hygiene. Patient ambulated down hall with walker to therapy gym for functional transfer training and self care adaptation education. Patient reports feeling a bit overwhelmed with caregiver responsibilities for his wife and aging parents, but can have some assist ensuring DME in the bathroom is set up as needed and items normally stored below knee height can be places in more accessible locations. Patient also reporting that some of his cancer medication can effect his memory and hopes he can remember and do well integrating his BLT precautions into his ADL/IADL responsibilities. Patient able to recall 3 of 3 precautions. Continue treatment with standing balance activities working on safe sit to stand at a walker from a chair and without arm rests. Patient with good task performance. UE endurance tasks using 2 lb dowel to allow for increased range of the LUE. Good tolerance of sets of 10 within pain free range into shoulder flexion, abduction and external rotation. Followed exercise with ambulation back to his room. Patient choosing to sit up in recliner. Call bell within reach, no request for pain medication at this time. Continue with skilled OT POC to improve dynamic balance for self care and IADL tasks.      Therapy Documentation Precautions:  Precautions Precautions: Fall, Back Precaution Comments: no brace needed; recalled 3/3 precautions without  cue Restrictions Weight Bearing Restrictions: No General:   Vital Signs: Therapy Vitals Temp: 98.9 F (37.2 C) Temp Source: Oral Pulse Rate: 74 Resp: 16 BP: 131/75 Patient Position (if appropriate): Sitting Oxygen Therapy SpO2: 98 % O2 Device: Room Air Pain: Pain Assessment Pain Scale: 0-10 Pain Score: 3  ADL: ADL Equipment Provided: Long-handled sponge Eating: Set up Where Assessed-Eating: Chair Grooming: Setup Where Assessed-Grooming: Sitting at sink Upper Body Bathing: Supervision/safety Where Assessed-Upper Body Bathing: Shower Lower Body Bathing: Supervision/safety Where Assessed-Lower Body Bathing: Shower Upper Body Dressing: Supervision/safety Where Assessed-Upper Body Dressing: Chair Lower Body Dressing: Contact guard Where Assessed-Lower Body Dressing: Sitting at sink, Standing at sink Toileting: Contact guard Where Assessed-Toileting: Teacher, adult education: Furniture conservator/restorer Method: Proofreader: Raised toilet seat Tub/Shower Transfer: Not assessed Film/video editor: Administrator, arts Method: Designer, industrial/product: Grab bars, Transfer tub bench   Therapy/Group: Individual Therapy  Warnell Forester 04/16/2023, 3:40 PM

## 2023-04-16 NOTE — Progress Notes (Signed)
PROGRESS NOTE   Subjective/Complaints:  Pt doing alright today, slept alright, pain well managed today, LBM yesterday, urinating fine. Denies any other complaints or concerns today.    ROS: See HPI  Pt denies SOB, abd pain, CP, N/V/C/D, and vision changes  Pain limiting (+)  Objective:   No results found. Recent Labs    04/14/23 0631  WBC 7.0  HGB 9.9*  HCT 30.3*  PLT 360   Recent Labs    04/14/23 0631  NA 137  K 5.0  CL 101  CO2 28  GLUCOSE 110*  BUN 19  CREATININE 0.83  CALCIUM 8.8*    Intake/Output Summary (Last 24 hours) at 04/16/2023 1047 Last data filed at 04/16/2023 0820 Gross per 24 hour  Intake 956 ml  Output 1150 ml  Net -194 ml        Physical Exam: Vital Signs Blood pressure (!) 148/90, pulse 67, temperature 97.9 F (36.6 C), resp. rate 18, height 5\' 10"  (1.778 m), weight 86.9 kg, SpO2 97%.     General: awake, alert, appropriate, sitting up in bedside chair; NAD HENT: conjugate gaze; oropharynx moist CV: regular rate and rhythm; no JVD, +systolic murmur best heard at LUSB Pulmonary: CTA B/L; no W/R/R- good air movement GI: soft, NT, ND, (+)BS throughout Psychiatric: appropriate- interactive Neurological: Ox3  PRIOR EXAMS: Skin- posterior Cervical spine incision- steristrips intact Neuro:  Alert and oriented x 3, follows commands, CN 2-12 intact, no speech or language deficits noted, good insight and awareness RUE: 5/5 Deltoid, 5/5 Biceps, 5/5 Triceps, 5/5 Wrist Ext, 5/5 Grip LUE: 4/5 Deltoid, 5/5 Biceps, 5/5 Triceps, 5/5 Wrist Ext, 5/5 Grip RLE: HF 5/5, KE 5/5, ADF 5/5, APF 5/5 LLE: HF 5/5, KE 5/5,  ADF 5/5, APF 5/5 Sensory exam normal for light touch and pain in all 4 limbs. No limb ataxia or cerebellar signs. No abnormal tone appreciated.  No ankle clonus Musculoskeletal: L shoulder pain and crepitus with PROM, no significant L shoulder tenderness   .   Assessment/Plan: 1. Functional deficits which require 3+ hours per day of interdisciplinary therapy in a comprehensive inpatient rehab setting. Physiatrist is providing close team supervision and 24 hour management of active medical problems listed below. Physiatrist and rehab team continue to assess barriers to discharge/monitor patient progress toward functional and medical goals  Care Tool:  Bathing    Body parts bathed by patient: Right arm, Left lower leg, Face, Left arm, Chest, Abdomen, Front perineal area, Buttocks, Right upper leg, Left upper leg, Right lower leg         Bathing assist Assist Level: Supervision/Verbal cueing     Upper Body Dressing/Undressing Upper body dressing   What is the patient wearing?: Pull over shirt    Upper body assist      Lower Body Dressing/Undressing Lower body dressing            Lower body assist Assist for lower body dressing: Contact Guard/Touching assist     Toileting Toileting    Toileting assist Assist for toileting: Supervision/Verbal cueing     Transfers Chair/bed transfer  Transfers assist     Chair/bed transfer assist level: Minimal Assistance - Patient >  75%     Locomotion Ambulation   Ambulation assist      Assist level: Minimal Assistance - Patient > 75% Assistive device: Walker-rolling Max distance: 84 ft   Walk 10 feet activity   Assist     Assist level: Minimal Assistance - Patient > 75% Assistive device: Walker-rolling   Walk 50 feet activity   Assist    Assist level: Minimal Assistance - Patient > 75% Assistive device: Walker-rolling    Walk 150 feet activity   Assist Walk 150 feet activity did not occur: Safety/medical concerns         Walk 10 feet on uneven surface  activity   Assist Walk 10 feet on uneven surfaces activity did not occur: Safety/medical concerns         Wheelchair     Assist Is the patient using a wheelchair?: Yes Type of Wheelchair:  Manual    Wheelchair assist level: Supervision/Verbal cueing Max wheelchair distance: 150 ft    Wheelchair 50 feet with 2 turns activity    Assist        Assist Level: Supervision/Verbal cueing   Wheelchair 150 feet activity     Assist      Assist Level: Supervision/Verbal cueing   Blood pressure (!) 148/90, pulse 67, temperature 97.9 F (36.6 C), resp. rate 18, height 5\' 10"  (1.778 m), weight 86.9 kg, SpO2 97%.  Medical Problem List and Plan: 1. 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              -Plan for radiation treatments week of 10/14             -patient may shower             -ELOS/Goals: 10-12, PT/OT Supervision              -Admit to CIR   Con't CIR PT and OT  IPOC 10/11  2.  Antithrombotics: -DVT/anticoagulation:  Mechanical:  Antiembolism stockings, knee (TED hose) Bilateral lower extremities. Awaiting call back from NS regarding initiating pharmacologic DVT prophylaxis-- now on Lovenox 40mg  BID             -antiplatelet therapy: none   3. Pain Management: Tylenol as needed             -continue Flexeril 10 mg TID prn             -continue Duragesic patch 12/mcg/hr q 72 hours -continue gabapentin 300 mg TID             -continue Lidoderm              -continue oxycodone 10 mg q 4 hrs prn>> d/c'd             -continue ibuprofen 400 mg q 6 hrs prn -10/10- will change Duragesic to 25 mcg q3 days as well as reduce Oxy to 5 mg q4 hours prn- since feels like patch more effective than Oxy.  -10/11- pain slightly better, but Oxy doesn't work- will change Oxy to Dilaudid 2-4 mg q4 hours prn and maintain Duragesic patch 25 mcg- pt happy with change.  4. Mood/Behavior/Sleep: LCSW to evaluate and provide emotional support             -History of GAD             -continue Lexapro 30 mg daily             -  continue Seroquel 200 mg q  HS             -continue trazodone 25 mg q HS             -continue Xanax 0.25 mg BID prn             -continue melatonin 3 mg q HS prn              -10/10-10/12- slept OK 5. Neuropsych/cognition: This patient is capable of making decisions on his own behalf.   6. Skin/Wound Care: Routine skin care checks             -monitor incision>>keep the Steri-Strips dry during showers per Dr. Wynetta Emery   7. Fluids/Electrolytes/Nutrition: Routine Is and Os and follow-up chemistries             -continue Mag-ox 400 mg BID             -continue B12 1000 mcg daily             -Hypokalemia/Hyponatremia- Recheck labs tomorrow -10/10- K+ 5.0- and Na 137- had been hypokalemic- so likely hemolysis- wil recheck Monday 8: Hypertension: controlled, monitor TID and prn   -04/16/23 BPs creeping up a bit, monitor trend for now Vitals:   04/13/23 1824 04/13/23 1942 04/14/23 0301 04/14/23 1300  BP: 124/86 (!) 132/90 138/72 129/80   04/14/23 1948 04/15/23 0500 04/15/23 1303 04/15/23 1943  BP: (!) 102/57 136/89 (!) 144/74 132/84   04/16/23 0514  BP: (!) 148/90    9: GERD: continue Protonix 40mg  BID   10: Current History of prostate cancer with mets to spine s/p decompressive laminectomy T3 and evacuation of epidural tumor Dr. Wynetta Emery 09/26             -continue Xtandi             -continue Decadron 4mg  q6h             -no brace needed; no twisting, arching, bending             -follow-up with Dr. Wynetta Emery, Dr. Mosetta Putt and Dr. Kathrynn Running   -10/11- starting radiation next week per notes-  11: Anemia: multifactorial             -follow-up CBC   -10/10- Hb 9.9 a little better 12. Constipation, cont miralax BID and senokotS 1 tab BID -10/10- will try Sorbitol 15cc- since doesn't want to blow out- since LBM 2 days ago- and feels constipated. -10/11- didn't have BM- will order Sorbitol 30cc and pt to take prune juice at lunch- ordered at 4pm.  -04/16/23 LBM yesterday, cont regimen    13. Heart murmur:  -04/16/23 pt noted to  have heart murmur, has been mentioned to him before, doubt need for emergent w/up although might need cardiology f/up for echo/eval.    LOS: 3 days A FACE TO FACE EVALUATION WAS PERFORMED  [address] 04/16/2023, 10:47 AM

## 2023-04-17 DIAGNOSIS — M4804 Spinal stenosis, thoracic region: Secondary | ICD-10-CM | POA: Diagnosis not present

## 2023-04-17 DIAGNOSIS — R011 Cardiac murmur, unspecified: Secondary | ICD-10-CM | POA: Diagnosis not present

## 2023-04-17 DIAGNOSIS — I10 Essential (primary) hypertension: Secondary | ICD-10-CM | POA: Diagnosis not present

## 2023-04-17 LAB — HEPATIC FUNCTION PANEL
ALT: 20 U/L (ref 0–44)
AST: 17 U/L (ref 15–41)
Albumin: 2.8 g/dL — ABNORMAL LOW (ref 3.5–5.0)
Alkaline Phosphatase: 77 U/L (ref 38–126)
Bilirubin, Direct: <0.1 mg/dL (ref 0.0–0.2)
Total Bilirubin: 0.6 mg/dL (ref 0.3–1.2)
Total Protein: 6 g/dL — ABNORMAL LOW (ref 6.5–8.1)

## 2023-04-17 LAB — PSA: Prostatic Specific Antigen: 53.13 ng/mL — ABNORMAL HIGH (ref 0.00–4.00)

## 2023-04-17 NOTE — Progress Notes (Signed)
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    9/26. Underwent Procedure: Decompressive laminectomy T3 extending into the inferior aspect of the 2 3 disc base to below the 3 4 disc base at the level of T4 pedicle. With partial facetectomy and evacuation of epidural tumor on 03/31/23. Admitted to rehab yesterday. Planning to start radiation next week.  Patient is wondering about his disease progression with several other spots reported on MRI.  We will follow-up with outpatient PSMA PET for restaging.  We discussed if there is high disease burden, then I recommend proceed with docetaxel chemotherapy.  He is concerning about chemotherapy toxicity.  We discussed given his good performance status, he likely will tolerate well.  Although sometimes unexpected toxicity can occur with chemotherapy we can also consider alternate schedule and dosing.  We can discuss this further as outpatient after he is restaging imaging.   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  We still have chemotherapy as he has good functional status for example docetaxel, cabazitaxel, radiation therapy with radium 223, and Pluvicto. His alkaline phosphatase is not significantly elevated. Will repeat new baseline PSA after radiation Recommend outpatient PSMA PET scan upon discharge. Continue enzalutamide for now with ADT as outpatient   Upper back pain with band like sensation/known T3 metastasis S/p decompressive surgery Radiation plan per radiation oncology starting next week.   B12 deficiency Continue b12   Appreciate pain management and rehab per PM&R.   Thank you for the consult,  if discharged, will follow-up as outpatient.  I will have our scheduler from medical oncology to contact the patient.  All questions were answered.     Thank you for the consult. Will follow with you. Melven Sartorius, MD 04/17/2023 11:10 AM  Subjective:  Nathan Berg reports feeling about the same.  Pain over the chest area about the same as well as the back.  Able to work with PT today.  Denies any urinary changes, paresthesia, lower extremity weakness.  Objective:  Vitals:   04/16/23 1951 04/17/23 0524  BP: 130/79 (!) 150/92  Pulse: 77 68  Resp: 18 18  Temp: 98.4 F (36.9 C) 97.6 F (36.4 C)  SpO2: 98% 96%     Intake/Output Summary (Last 24 hours) at 04/17/2023 1110 Last data filed at 04/17/2023 0750 Gross per 24 hour  Intake 1200 ml  Output 1225 ml  Net -25 ml    GENERAL: alert, no distress and comfortable SKIN: skin color normal EYES: normal, sclera clear LUNGS: normal breathing effort.  HEART: pulse normal Musculoskeletal: no lower extremity edema NEURO: alert with fluent speech, no focal motor/sensory deficits Strength and sensation equal bilateral lower extremities   Labs:  Recent Labs    03/09/23 0913 03/28/23 1800 03/30/23 0724 04/07/23 0805 04/08/23 0547 04/14/23 0631  NA 141 136   < > 132* 135 137  K 4.1 3.8   < > 3.3* 4.1 5.0  CL 106 100   < > 101 102 101  CO2 31 28   < > 23 26 28   GLUCOSE 86 105*   < > 132* 95 110*  BUN 16 15   < > 24* 24* 19  CREATININE  0.87 0.83   < > 0.98 0.81 0.83  CALCIUM 9.2 8.9   < > 8.1* 8.3* 8.8*  GFRNONAA >60 >60   < > >60 >60 >60  PROT 6.4* 7.2  --   --   --  5.4*  ALBUMIN 3.8 4.0  --   --   --  2.6*  AST 19 27  --   --   --  19  ALT 20 32  --   --   --  18  ALKPHOS 98 99  --   --   --  78  BILITOT 0.4 0.6  --   --   --  0.4   < > = values in this interval not displayed.    Studies:  MR THORACIC SPINE W WO CONTRAST  Result Date: 04/11/2023 CLINICAL DATA:  Initial evaluation for metastatic disease evaluation. SRS  protocol. EXAM: MRI THORACIC WITHOUT AND WITH CONTRAST TECHNIQUE: Multiplanar and multiecho pulse sequences of the thoracic spine were obtained without and with intravenous contrast. CONTRAST:  10mL GADAVIST GADOBUTROL 1 MMOL/ML IV SOLN COMPARISON:  Prior study from 03/30/2023. FINDINGS: Alignment:  A limited SRS protocol MRI was performed. Vertebral bodies normally aligned with preservation of the normal thoracic kyphosis. No listhesis. Vertebrae: Osseous metastases involving the T3 and T4 vertebral bodies are seen. There is complete replacement of the T3 and T4 vertebral bodies themselves, with involvement of the left posterior elements. Associated pathologic compression fracture of T3 with severe height loss and vertebral plana formation and 5 mm bony retropulsion. More mild compression deformity of T4 with up to 20% height loss without retropulsion. Epidural extension of tumor at T2 through T4, most pronounced within the ventral epidural space. Patient is status post posterior decompression at T2-3 and T3-4. Residual severe stenosis at the level of T3 due to bony retropulsion and epidural tumor. Thecal sac measures 6 mm in AP diameter at its most narrow point (series 5, image 16). More mild stenosis noted at the levels of T2 and T4. Examination is to be utilized for treatment planning purposes. Additional osseous metastases are seen involving the T2 vertebral body without associated pathologic fracture or extraosseous extension. Additional osseous metastasis noted involving the C6 vertebral body on counter sequence (series 2, image 9). Few additional scattered small osseous metastases noted distally within the thoracic spine involving the T5, T8, and T10 vertebral bodies. No other pathologic compression fracture. No other epidural or extraosseous extension of tumor. Cord: Focal kinking of the upper thoracic cord at the level of T3 due to compression fracture and stenosis at this level (series 3, image 12). No  visible cord signal changes. Paraspinal and other soft tissues: Postoperative changes noted within the upper thoracic posterior paraspinous soft tissues. No worrisome collections. 1 cm sebaceous cyst noted within the subcutaneous fat of the upper thoracic spine. Layering left pleural effusion partially visualized. Disc levels: Underlying mild for age spondylosis and multilevel facet arthrosis. No other significant spinal stenosis. Moderate left foraminal narrowing at T10-11. IMPRESSION: 1. Osseous metastases involving the T3 and T4 vertebral bodies. Associated pathologic compression fractures as above, severe at the T3 level. Epidural extension of tumor at T2 through T4 with sequelae of prior posterior decompression at T2-3 and T3-4. Residual severe stenosis at the level of T3 due to bony retropulsion and epidural tumor. More mild stenosis at the levels of T2 and T4. Examination is to be utilized for treatment planning purposes. 2. Additional scattered osseous metastases involving the C6, T2, T5, T8,  and T10 vertebral bodies. No other pathologic fracture or extraosseous extension of tumor. 3. Layering left pleural effusion. Electronically Signed   By: Rise Mu M.D.   On: 04/11/2023 21:58   DG Thoracic Spine 1 View  Result Date: 03/31/2023 CLINICAL DATA:  Thoracic laminectomy for tumor. EXAM: OPERATIVE THORACIC SPINE 1 VIEW(S) COMPARISON:  03/30/2023. FINDINGS: A single fluoroscopic image was obtained intraoperatively for thoracic laminectomy. Total fluoroscopy time is 2.9 seconds. Dose: 0.5 mGy. Please see operative report for additional information. IMPRESSION: Intraoperative utilization of fluoroscopy. Electronically Signed   By: Thornell Sartorius M.D.   On: 03/31/2023 20:01   DG C-Arm 1-60 Min-No Report  Result Date: 03/31/2023 Fluoroscopy was utilized by the requesting physician.  No radiographic interpretation.   MR OUTSIDE FILMS SPINE  Result Date: 03/30/2023 This examination belongs to an  outside facility and is stored here for comparison purposes only.  Contact the originating outside institution for any associated report or interpretation.  CT THORACIC SPINE WO CONTRAST  Result Date: 03/30/2023 CLINICAL DATA:  Thoracic compression fracture.  Known malignancy. EXAM: CT THORACIC SPINE WITHOUT CONTRAST TECHNIQUE: Multidetector CT images of the thoracic were obtained using the standard protocol without intravenous contrast. RADIATION DOSE REDUCTION: This exam was performed according to the departmental dose-optimization program which includes automated exposure control, adjustment of the mA and/or kV according to patient size and/or use of iterative reconstruction technique. COMPARISON:  Thoracic spine MRI 1 day prior FINDINGS: Alignment: Normal. Vertebrae: Again seen is marked compression deformity of the T3 vertebral body with near-complete loss of vertebral body height and 4 mm bony retropulsion consistent with pathologic fracture in the setting of osseous metastatic disease. There is also a nondisplaced fracture through the right T3 lamina and transverse process extending to the inferior articulating process (5-23, 7-37). Also again seen is the more mild pathologic compression fracture of the T4 vertebral body with approximately 40% loss of vertebral body height anteriorly and no bony retropulsion. There is a nondisplaced fracture of the right T4 transverse process (5-31, 7-32). The other vertebral body heights are preserved. The bones are diffusely demineralized. Known additional metastatic lesions as well as epidural tumor at T3 and T4 are better seen on the thoracic spine MRI from 1 day prior. Paraspinal and other soft tissues: Nodular scarring in the medial right lung apex is unchanged. A probable sebaceous cyst is noted in the subcutaneous fat just to the left of midline at the T1 level. A large hiatal hernia is unchanged. Disc levels: There is overall mild underlying degenerative change  throughout the thoracic spine. Known severe spinal canal stenosis at T3 is better seen on the MRI. IMPRESSION: 1. Pathologic compression fractures of the T3 and T4 vertebral bodies are similar to the prior MRI, worse at T3 where there is essentially complete loss of vertebral body height and bony retropulsion. Known severe spinal canal stenosis due to the retropulsion and epidural tumor is better seen on the prior MRI. 2. Additional nondisplaced fractures of the right T3 lamina, transverse process, and inferior articulating process, and right T4 transverse process. Electronically Signed   By: Lesia Hausen M.D.   On: 03/30/2023 13:50   MR THORACIC SPINE W WO CONTRAST  Result Date: 03/30/2023 CLINICAL DATA:  Initial evaluation for musculoskeletal neoplasm, history of prostate cancer. EXAM: MRI THORACIC WITHOUT AND WITH CONTRAST TECHNIQUE: Multiplanar and multiecho pulse sequences of the thoracic spine were obtained without and with intravenous contrast. CONTRAST:  9mL GADAVIST GADOBUTROL 1 MMOL/ML IV SOLN COMPARISON:  Prior study from 12/09/2022. FINDINGS: Alignment: Vertebral bodies normally aligned with preservation of the normal thoracic kyphosis. Trace degenerative retrolisthesis of L1 on L2 noted. Vertebrae: Osseous metastasis involving the T3 vertebral body is seen. Associated pathologic compression fracture with vertebral plana formation and up to 6 mm of bony retropulsion. Additional osseous metastasis involves the entirety of the T4 vertebral body. Associated 40% height loss without significant bony retropulsion. Tumor extends to involve the posterior elements bilaterally. Epidural extension of tumor at these levels with tumor within the ventral epidural space, most pronounced at T3 where there is resultant severe spinal stenosis and cord compression (series 10, image 17). Secondary cord flattening with suspected early/mild cord signal changes (series 4, image 13). Thecal sac measures approximately 4-5 mm  in AP diameter. No more than mild stenosis inferiorly at the level of T4. Moderate to severe bilateral foraminal narrowing noted at T2-3 and T3-4 as well. Multiple additional smaller osseous metastases seen scattered elsewhere throughout the thoracic spine. For reference purposes, the largest additional lesion involves the inferior aspect of T2 and measures 1.5 cm (series 5, image 12). No other significant extra osseous or epidural extension of tumor. No other pathologic compression fracture. Few additional scattered osseous metastases noted within the cervical spine on counter sequence. No visible extra osseous or epidural extension of tumor within the cervical spine itself. Cord: Cord impingement with suspected subtle early cord signal changes at the level of T3 as above. Otherwise normal signal and morphology. Paraspinal and other soft tissues: Paraspinous soft tissues demonstrate no acute finding. Small layering left pleural effusion noted. Disc levels: Underlying mild for age multilevel thoracic spondylosis elsewhere throughout the thoracic spine. Scattered superimposed multilevel facet disease. No other significant spinal stenosis. Mild multilevel foraminal narrowing noted within the lower thoracic spine at T10-11 bilaterally and T12-L1 on the left. IMPRESSION: 1. Osseous metastases involving the T3 and T4 vertebral bodies with associated pathologic compression fractures as above. Epidural extension of tumor at these levels with resultant severe spinal stenosis and cord compression at the level of T3. Probable subtle early/mild cord signal changes at this level. 2. Multiple additional smaller osseous metastases scattered elsewhere throughout the thoracic spine. No other significant extra osseous or epidural extension of tumor. 3. Underlying mild for age thoracic spondylosis without other high-grade spinal stenosis. Mild multilevel foraminal narrowing within the lower thoracic spine as above. 4. Small layering  left pleural effusion. Electronically Signed   By: Rise Mu M.D.   On: 03/30/2023 04:25

## 2023-04-17 NOTE — Plan of Care (Signed)
  Problem: Consults Goal: RH SPINAL CORD INJURY PATIENT EDUCATION Description:  See Patient Education module for education specifics.  Outcome: Progressing Goal: Skin Care Protocol Initiated - if Braden Score 18 or less Description: If consults are not indicated, leave blank or document N/A Outcome: Progressing Goal: Nutrition Consult-if indicated Outcome: Progressing Goal: Diabetes Guidelines if Diabetic/Glucose > 140 Description: If diabetic or lab glucose is > 140 mg/dl - Initiate Diabetes/Hyperglycemia Guidelines & Document Interventions  Outcome: Progressing   Problem: SCI BOWEL ELIMINATION Goal: RH STG MANAGE BOWEL WITH ASSISTANCE Description: STG Manage Bowel with Mod I Assistance. Outcome: Progressing Goal: RH STG SCI MANAGE BOWEL WITH MEDICATION WITH ASSISTANCE Description: STG SCI Manage bowel with medication with mod I assistance. Outcome: Progressing Goal: RH STG MANAGE BOWEL W/EQUIPMENT W/ASSISTANCE Description: STG Manage Bowel With Equipment With Mod I Assistance Outcome: Progressing Goal: RH OTHER STG BOWEL ELIMINATION GOALS W/ASSIST Description: Other STG Bowel Elimination Goals With Mod I Assistance. Outcome: Progressing   Problem: SCI BLADDER ELIMINATION Goal: RH STG MANAGE BLADDER WITH ASSISTANCE Description: STG Manage Bladder With Mod I Assistance Outcome: Progressing Goal: RH STG MANAGE BLADDER WITH MEDICATION WITH ASSISTANCE Description: STG Manage Bladder With Medication With Mod I Assistance. Outcome: Progressing Goal: RH OTHER STG BLADDER ELIMINATION GOALS W/ASSIST Description: Other STG Bladder Elimination Goals With Assistance Outcome: Progressing   Problem: RH SKIN INTEGRITY Goal: RH STG SKIN FREE OF INFECTION/BREAKDOWN Outcome: Progressing Goal: RH STG MAINTAIN SKIN INTEGRITY WITH ASSISTANCE Description: STG Maintain Skin Integrity With Assistance. Outcome: Progressing   Problem: RH SAFETY Goal: RH STG ADHERE TO SAFETY PRECAUTIONS  W/ASSISTANCE/DEVICE Description: STG Adhere to Safety Precautions With Assistance/Device. Outcome: Progressing   Problem: RH KNOWLEDGE DEFICIT SCI Goal: RH STG INCREASE KNOWLEDGE OF SELF CARE AFTER SCI Outcome: Progressing   Problem: Education: Goal: Ability to verbalize activity precautions or restrictions will improve Outcome: Progressing Goal: Knowledge of the prescribed therapeutic regimen will improve Outcome: Progressing Goal: Understanding of discharge needs will improve Outcome: Progressing   Problem: Activity: Goal: Ability to avoid complications of mobility impairment will improve Outcome: Progressing Goal: Ability to tolerate increased activity will improve Outcome: Progressing Goal: Will remain free from falls Outcome: Progressing   Problem: Bowel/Gastric: Goal: Gastrointestinal status for postoperative course will improve Outcome: Progressing   Problem: Clinical Measurements: Goal: Ability to maintain clinical measurements within normal limits will improve Outcome: Progressing Goal: Postoperative complications will be avoided or minimized Outcome: Progressing Goal: Diagnostic test results will improve Outcome: Progressing   Problem: Pain Management: Goal: Pain level will decrease Outcome: Progressing   Problem: Skin Integrity: Goal: Will show signs of wound healing Outcome: Progressing   Problem: Health Behavior/Discharge Planning: Goal: Identification of resources available to assist in meeting health care needs will improve Outcome: Progressing   Problem: Bladder/Genitourinary: Goal: Urinary functional status for postoperative course will improve Outcome: Progressing

## 2023-04-17 NOTE — Progress Notes (Signed)
PROGRESS NOTE   Subjective/Complaints:  Pt doing just ok today, slept alright until about 5am then woke up with pain. Pain managed ok but seems to be more prominent today than before-- but worked a lot yesterday in therapy. Meds still help. Mostly just hurts with activity, but at rest he's alright. LBM yesterday, urinating fine. Denies any other complaints or concerns today.    ROS: See HPI  Pt denies SOB, abd pain, CP, N/V/C/D, and vision changes  Pain limiting (+)  Objective:   No results found. No results for input(s): "WBC", "HGB", "HCT", "PLT" in the last 72 hours.  No results for input(s): "NA", "K", "CL", "CO2", "GLUCOSE", "BUN", "CREATININE", "CALCIUM" in the last 72 hours.   Intake/Output Summary (Last 24 hours) at 04/17/2023 1018 Last data filed at 04/17/2023 0750 Gross per 24 hour  Intake 1200 ml  Output 1225 ml  Net -25 ml        Physical Exam: Vital Signs Blood pressure (!) 150/92, pulse 68, temperature 97.6 F (36.4 C), resp. rate 18, height 5\' 10"  (1.778 m), weight 86.9 kg, SpO2 96%.     General: awake, alert, appropriate, reclined in bedside chair; NAD HENT: conjugate gaze; oropharynx moist CV: regular rate and rhythm; no JVD, +systolic murmur best heard at LUSB Pulmonary: CTA B/L; no W/R/R- good air movement GI: soft, NT, ND, (+)BS throughout Psychiatric: appropriate- interactive Neurological: Ox3  PRIOR EXAMS: Skin- posterior Cervical spine incision- steristrips intact Neuro:  Alert and oriented x 3, follows commands, CN 2-12 intact, no speech or language deficits noted, good insight and awareness RUE: 5/5 Deltoid, 5/5 Biceps, 5/5 Triceps, 5/5 Wrist Ext, 5/5 Grip LUE: 4/5 Deltoid, 5/5 Biceps, 5/5 Triceps, 5/5 Wrist Ext, 5/5 Grip RLE: HF 5/5, KE 5/5, ADF 5/5, APF 5/5 LLE: HF 5/5, KE 5/5,  ADF 5/5, APF 5/5 Sensory exam normal for light touch and pain in all 4 limbs. No limb ataxia or  cerebellar signs. No abnormal tone appreciated.  No ankle clonus Musculoskeletal: L shoulder pain and crepitus with PROM, no significant L shoulder tenderness   .  Assessment/Plan: 1. Functional deficits which require 3+ hours per day of interdisciplinary therapy in a comprehensive inpatient rehab setting. Physiatrist is providing close team supervision and 24 hour management of active medical problems listed below. Physiatrist and rehab team continue to assess barriers to discharge/monitor patient progress toward functional and medical goals  Care Tool:  Bathing    Body parts bathed by patient: Right arm, Left lower leg, Face, Left arm, Chest, Abdomen, Front perineal area, Buttocks, Right upper leg, Left upper leg, Right lower leg         Bathing assist Assist Level: Supervision/Verbal cueing     Upper Body Dressing/Undressing Upper body dressing   What is the patient wearing?: Pull over shirt    Upper body assist      Lower Body Dressing/Undressing Lower body dressing            Lower body assist Assist for lower body dressing: Contact Guard/Touching assist     Toileting Toileting    Toileting assist Assist for toileting: Supervision/Verbal cueing     Transfers Chair/bed transfer  Transfers  assist     Chair/bed transfer assist level: Minimal Assistance - Patient > 75%     Locomotion Ambulation   Ambulation assist      Assist level: Minimal Assistance - Patient > 75% Assistive device: Walker-rolling Max distance: 84 ft   Walk 10 feet activity   Assist     Assist level: Minimal Assistance - Patient > 75% Assistive device: Walker-rolling   Walk 50 feet activity   Assist    Assist level: Minimal Assistance - Patient > 75% Assistive device: Walker-rolling    Walk 150 feet activity   Assist Walk 150 feet activity did not occur: Safety/medical concerns         Walk 10 feet on uneven surface  activity   Assist Walk 10 feet on  uneven surfaces activity did not occur: Safety/medical concerns         Wheelchair     Assist Is the patient using a wheelchair?: Yes Type of Wheelchair: Manual    Wheelchair assist level: Supervision/Verbal cueing Max wheelchair distance: 150 ft    Wheelchair 50 feet with 2 turns activity    Assist        Assist Level: Supervision/Verbal cueing   Wheelchair 150 feet activity     Assist      Assist Level: Supervision/Verbal cueing   Blood pressure (!) 150/92, pulse 68, temperature 97.6 F (36.4 C), resp. rate 18, height 5\' 10"  (1.778 m), weight 86.9 kg, SpO2 96%.  Medical Problem List and Plan: 1. 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              -Plan for radiation treatments week of 10/14             -patient may shower             -ELOS/Goals: 10-12, PT/OT Supervision              -Admit to CIR   Con't CIR PT and OT  IPOC 10/11  2.  Antithrombotics: -DVT/anticoagulation:  Mechanical:  Antiembolism stockings, knee (TED hose) Bilateral lower extremities. Awaiting call back from NS regarding initiating pharmacologic DVT prophylaxis-- now on Lovenox 40mg  BID             -antiplatelet therapy: none   3. Pain Management: Tylenol as needed             -continue Flexeril 10 mg TID prn             -continue Duragesic patch 12/mcg/hr q 72 hours -continue gabapentin 300 mg TID             -continue Lidoderm              -continue oxycodone 10 mg q 4 hrs prn>> d/c'd             -continue ibuprofen 400 mg q 6 hrs prn -10/10- will change Duragesic to 25 mcg q3 days as well as reduce Oxy to 5 mg q4 hours prn- since feels like patch more effective than Oxy.  -10/11- pain slightly better, but Oxy doesn't work- will change Oxy to Dilaudid 2-4 mg q4 hours prn and maintain Duragesic patch 25 mcg- pt happy with change.  -04/17/23 pain  intensity worse today but worked hard yesterday; not needing pain meds too much before they're "due" so will leave meds in place for now; defer adjustments to  weekday team if needed.  4. Mood/Behavior/Sleep: LCSW to evaluate and provide emotional support             -History of GAD             -continue Lexapro 30 mg daily             -continue Seroquel 200 mg q HS             -continue trazodone 25 mg q HS             -continue Xanax 0.25 mg BID prn             -continue melatonin 3 mg q HS prn              -10/10-10/13- slept OK 5. Neuropsych/cognition: This patient is capable of making decisions on his own behalf.   6. Skin/Wound Care: Routine skin care checks             -monitor incision>>keep the Steri-Strips dry during showers per Dr. Wynetta Emery   7. Fluids/Electrolytes/Nutrition: Routine Is and Os and follow-up chemistries             -continue Mag-ox 400 mg BID             -continue B12 1000 mcg daily             -Hypokalemia/Hyponatremia- Recheck labs tomorrow -10/10- K+ 5.0- and Na 137- had been hypokalemic- so likely hemolysis- wil recheck Monday 8: Hypertension: controlled, monitor TID and prn   -04/17/23 BPs sort of variable but mostly ok, monitor for now.  Vitals:   04/13/23 1824 04/13/23 1942 04/14/23 0301 04/14/23 1300  BP: 124/86 (!) 132/90 138/72 129/80   04/14/23 1948 04/15/23 0500 04/15/23 1303 04/15/23 1943  BP: (!) 102/57 136/89 (!) 144/74 132/84   04/16/23 0514 04/16/23 1321 04/16/23 1951 04/17/23 0524  BP: (!) 148/90 131/75 130/79 (!) 150/92    9: GERD: continue Protonix 40mg  BID   10: Current History of prostate cancer with mets to spine s/p decompressive laminectomy T3 and evacuation of epidural tumor Dr. Wynetta Emery 09/26             -continue Xtandi             -continue Decadron 4mg  q6h             -no brace needed; no twisting, arching, bending             -follow-up with Dr. Wynetta Emery, Dr. Mosetta Putt and Dr. Kathrynn Running   -10/11- starting radiation next week per notes-  11:  Anemia: multifactorial             -follow-up CBC   -10/10- Hb 9.9 a little better 12. Constipation, cont miralax BID and senokotS 1 tab BID -10/10- will try Sorbitol 15cc- since doesn't want to blow out- since LBM 2 days ago- and feels constipated. -10/11- didn't have BM- will order Sorbitol 30cc and pt to take prune juice at lunch- ordered at 4pm.  -04/17/23 LBM yesterday, cont regimen    13. Heart murmur:  -04/16/23 pt noted to have heart murmur, has been mentioned to him before, doubt need for emergent w/up although might need cardiology f/up for echo/eval.    LOS: 4 days A FACE TO FACE EVALUATION WAS PERFORMED  [id number] 04/17/2023, 10:18 AM

## 2023-04-17 NOTE — Progress Notes (Signed)
Physical Therapy Session Note  Patient Details  Name: Nathan Berg MRN: [id number] Date of Birth: June 23, 1956  Today's Date: 04/17/2023 PT Individual Time: 4098-1191 PT Individual Time Calculation (min): 40 min   Short Term Goals: Week 1:  PT Short Term Goal 1 (Week 1): Pt will ambulate >100 ft with RW PT Short Term Goal 2 (Week 1): Pt will navigate stairs per home environment with assist PT Short Term Goal 3 (Week 1): Pt will sit OOB for whole day of therapy.  Skilled Therapeutic Interventions/Progress Updates: Pt presented in recliner agreeable to therapy. Pt states increased pain in ribs but feels more muscular in nature. Premedicated with rest and respositioning provided during session. Pt completed ambulatory transfer to w/c with CGA and transported to main gym for time management. In gym completed ambulatory transfer to high/low mat in same manner as prior. Pt then completed Sit to stand x 5 without AD for BLE strengthening. Pt noted to demonstrate improved technique and knee stability on this date. Pt then completed toe taps to 2in step x 5 bilaterally. Pt required HHA for stability but noted significantly increased effort when performing with LLE. Pt also participated in standing dynamic balance activities including placing clothespins on basketball net without AD requiring CGA and tossing 1kg weighted ball against rebounder 2 x 20 also able to perform with CGA. Pt returned to w/c and transported to nsg station then pt was able to ambulate remaining distance back to room. Pt returned to recliner at end of session and left with seat alarm on, call bell within reach and needs met.      Therapy Documentation Precautions:  Precautions Precautions: Fall, Back Precaution Comments: no brace needed; recalled 3/3 precautions without cue Restrictions Weight Bearing Restrictions: No General:   Vital Signs: Therapy Vitals Temp: 99.2 F (37.3 C) Temp Source: Oral Pulse Rate: 79 Resp:  18 BP: 126/73 Patient Position (if appropriate): Sitting Oxygen Therapy SpO2: 96 % O2 Device: Room Air      Therapy/Group: Individual Therapy  Brisa Auth 04/17/2023, 4:17 PM

## 2023-04-18 ENCOUNTER — Ambulatory Visit: Payer: Medicare Other | Admitting: Radiation Oncology

## 2023-04-18 ENCOUNTER — Other Ambulatory Visit: Payer: Self-pay

## 2023-04-18 ENCOUNTER — Inpatient Hospital Stay: Payer: Medicare Other

## 2023-04-18 ENCOUNTER — Encounter (HOSPITAL_COMMUNITY): Payer: Medicare Other

## 2023-04-18 DIAGNOSIS — M4714 Other spondylosis with myelopathy, thoracic region: Secondary | ICD-10-CM | POA: Diagnosis not present

## 2023-04-18 LAB — BASIC METABOLIC PANEL
Anion gap: 7 (ref 5–15)
BUN: 19 mg/dL (ref 8–23)
CO2: 28 mmol/L (ref 22–32)
Calcium: 8.5 mg/dL — ABNORMAL LOW (ref 8.9–10.3)
Chloride: 100 mmol/L (ref 98–111)
Creatinine, Ser: 1.05 mg/dL (ref 0.61–1.24)
GFR, Estimated: >60 mL/min (ref >60)
Glucose, Bld: 109 mg/dL — ABNORMAL HIGH (ref 70–99)
Potassium: 5 mmol/L (ref 3.5–5.1)
Sodium: 135 mmol/L (ref 135–145)

## 2023-04-18 LAB — CBC
HCT: 27.8 % — ABNORMAL LOW (ref 39.0–52.0)
Hemoglobin: 9.1 g/dL — ABNORMAL LOW (ref 13.0–17.0)
MCH: 31.5 pg (ref 26.0–34.0)
MCHC: 32.7 g/dL (ref 30.0–36.0)
MCV: 96.2 fL (ref 80.0–100.0)
Platelets: 353 10*3/uL (ref 150–400)
RBC: 2.89 MIL/uL — ABNORMAL LOW (ref 4.22–5.81)
RDW: 12.3 % (ref 11.5–15.5)
WBC: 5.4 10*3/uL (ref 4.0–10.5)
nRBC: 0 % (ref 0.0–0.2)

## 2023-04-18 LAB — TESTOSTERONE: Testosterone: <3 ng/dL — ABNORMAL LOW (ref 264–916)

## 2023-04-18 NOTE — Progress Notes (Signed)
Patient ID: Nathan Berg, male   DOB: July 25, 1955, 67 y.o.   MRN: [id number]  SW spoke with [name] on home phone (best number to call) to introduce self, explain role, discuss discharge process, and inform on ELOS. 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 When discussing family edu and assistance with transportation, she reported she has almost exhausted these supports, and they are also taking her to a follow-up with specialist on Wednesday and just not sure she is likely able to come in. She reports she needs pt to be walking at time of discharge. She is aware SW will follow-up with updates after team conference tomorrow.  Cecile Sheerer, MSW, LCSWA Office: [phone] Cell: [phone] Fax: [phone]

## 2023-04-18 NOTE — Progress Notes (Signed)
PROGRESS NOTE   Subjective/Complaints:  Pt reports that Dilaudid is doing better than Oxycodone was-  got pain meds at 4am, and doing pretty well this AM.  Still taking Advil at least 1x/day since wants to not take Dilaudid "all the time".  Feels like we have "adequate pain control".  Still has band around middle c/w T3 mets and At level SCI pain.  LBM 2 days ago x2- was normal consistency.  Voiding well.  Slept well- will need to go home Mod I and wish could drive, since wife cnanot right now- she had some sort of eye "detachment"- where a curtain of loss of vision in 1 eye.    ROS: See HPI   Pt denies SOB, abd pain, CP, N/V/C/D, and vision changes   Pain limiting (+)  Objective:   No results found. Recent Labs    04/18/23 0534  WBC 5.4  HGB 9.1*  HCT 27.8*  PLT 353    Recent Labs    04/18/23 0534  NA 135  K 5.0  CL 100  CO2 28  GLUCOSE 109*  BUN 19  CREATININE 1.05  CALCIUM 8.5*     Intake/Output Summary (Last 24 hours) at 04/18/2023 0906 Last data filed at 04/18/2023 0747 Gross per 24 hour  Intake 953 ml  Output 1700 ml  Net -747 ml        Physical Exam: Vital Signs Blood pressure 135/80, pulse 66, temperature 97.8 F (36.6 C), resp. rate 17, height 5\' 10"  (1.778 m), weight 86.9 kg, SpO2 98%.      General: awake, alert, appropriate, sitting up in bedside chair; NAD HENT: conjugate gaze; oropharynx moist CV: regular rate and rhythm- trace heart murmur heard? possibly; no JVD Pulmonary: CTA B/L; no W/R/R- good air movement GI: soft, NT, ND, (+)BS- slightly hypoactive Psychiatric: appropriate- hard to get info from but in good spirits Neurological: Ox3  PRIOR EXAMS: Skin- posterior Cervical spine incision- steristrips intact Neuro:  Alert and oriented x 3, follows commands, CN 2-12 intact, no speech or language deficits noted, good insight and awareness RUE: 5/5 Deltoid, 5/5 Biceps,  5/5 Triceps, 5/5 Wrist Ext, 5/5 Grip LUE: 4/5 Deltoid, 5/5 Biceps, 5/5 Triceps, 5/5 Wrist Ext, 5/5 Grip RLE: HF 5/5, KE 5/5, ADF 5/5, APF 5/5 LLE: HF 5/5, KE 5/5,  ADF 5/5, APF 5/5 Sensory exam normal for light touch and pain in all 4 limbs. No limb ataxia or cerebellar signs. No abnormal tone appreciated.  No ankle clonus Musculoskeletal: L shoulder pain and crepitus with PROM, no significant L shoulder tenderness   .  Assessment/Plan: 1. Functional deficits which require 3+ hours per day of interdisciplinary therapy in a comprehensive inpatient rehab setting. Physiatrist is providing close team supervision and 24 hour management of active medical problems listed below. Physiatrist and rehab team continue to assess barriers to discharge/monitor patient progress toward functional and medical goals  Care Tool:  Bathing    Body parts bathed by patient: Right arm, Left lower leg, Face, Left arm, Chest, Abdomen, Front perineal area, Buttocks, Right upper leg, Left upper leg, Right lower leg         Bathing assist Assist Level: Supervision/Verbal  cueing     Upper Body Dressing/Undressing Upper body dressing   What is the patient wearing?: Pull over shirt    Upper body assist      Lower Body Dressing/Undressing Lower body dressing            Lower body assist Assist for lower body dressing: Contact Guard/Touching assist     Toileting Toileting    Toileting assist Assist for toileting: Supervision/Verbal cueing     Transfers Chair/bed transfer  Transfers assist     Chair/bed transfer assist level: Minimal Assistance - Patient > 75%     Locomotion Ambulation   Ambulation assist      Assist level: Minimal Assistance - Patient > 75% Assistive device: Walker-rolling Max distance: 84 ft   Walk 10 feet activity   Assist     Assist level: Minimal Assistance - Patient > 75% Assistive device: Walker-rolling   Walk 50 feet activity   Assist    Assist  level: Minimal Assistance - Patient > 75% Assistive device: Walker-rolling    Walk 150 feet activity   Assist Walk 150 feet activity did not occur: Safety/medical concerns         Walk 10 feet on uneven surface  activity   Assist Walk 10 feet on uneven surfaces activity did not occur: Safety/medical concerns         Wheelchair     Assist Is the patient using a wheelchair?: Yes Type of Wheelchair: Manual    Wheelchair assist level: Supervision/Verbal cueing Max wheelchair distance: 150 ft    Wheelchair 50 feet with 2 turns activity    Assist        Assist Level: Supervision/Verbal cueing   Wheelchair 150 feet activity     Assist      Assist Level: Supervision/Verbal cueing   Blood pressure 135/80, pulse 66, temperature 97.8 F (36.6 C), resp. rate 17, height 5\' 10"  (1.778 m), weight 86.9 kg, SpO2 98%.  Medical Problem List and Plan: 1. 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              -Plan for radiation treatments week of 10/14             -patient may shower             -ELOS/Goals: 10-12, PT/OT Supervision              -Admit to CIR   Con't CIR PT and OT  - per pt, needs to go home Mod I due to wife not being able ot help physically and is partially blind right now- new 2.  Antithrombotics: -DVT/anticoagulation:  Mechanical:  Antiembolism stockings, knee (TED hose) Bilateral lower extremities. Awaiting call back from NS regarding initiating pharmacologic DVT prophylaxis-- now on Lovenox 40mg  BID             -antiplatelet therapy: none   3. Pain Management: Tylenol as needed             -continue Flexeril 10 mg TID prn             -continue Duragesic patch 12/mcg/hr q 72 hours -continue gabapentin 300 mg TID             -continue Lidoderm              -continue oxycodone 10 mg q 4 hrs prn>> d/c'd              -  continue ibuprofen 400 mg q 6 hrs prn -10/10- will change Duragesic to 25 mcg q3 days as well as reduce Oxy to 5 mg q4 hours prn- since feels like patch more effective than Oxy.  -10/11- pain slightly better, but Oxy doesn't work- will change Oxy to Dilaudid 2-4 mg q4 hours prn and maintain Duragesic patch 25 mcg- pt happy with change.  -04/17/23 pain intensity worse today but worked hard yesterday; not needing pain meds too much before they're "due" so will leave meds in place for now; defer adjustments to weekday team if needed.  10/14- pain adequate control per pt- will con't current regimen for now 4. Mood/Behavior/Sleep: LCSW to evaluate and provide emotional support             -History of GAD             -continue Lexapro 30 mg daily             -continue Seroquel 200 mg q HS             -continue trazodone 25 mg q HS             -continue Xanax 0.25 mg BID prn             -continue melatonin 3 mg q HS prn              10/14- sleeping well 5. Neuropsych/cognition: This patient is capable of making decisions on his own behalf.   6. Skin/Wound Care: Routine skin care checks             -monitor incision>>keep the Steri-Strips dry during showers per Dr. Wynetta Emery   7. Fluids/Electrolytes/Nutrition: Routine Is and Os and follow-up chemistries             -continue Mag-ox 400 mg BID             -continue B12 1000 mcg daily             -Hypokalemia/Hyponatremia- Recheck labs tomorrow -10/10- K+ 5.0- and Na 137- had been hypokalemic- so likely hemolysis- wil recheck Monday 8: Hypertension: controlled, monitor TID and prn   -04/17/23 BPs sort of variable but mostly ok, monitor for now.   10/14- BP doing better- more controlled- con't regimen Vitals:   04/14/23 1300 04/14/23 1948 04/15/23 0500 04/15/23 1303  BP: 129/80 (!) 102/57 136/89 (!) 144/74   04/15/23 1943 04/16/23 0514 04/16/23 1321 04/16/23 1951  BP: 132/84 (!) 148/90 131/75 130/79   04/17/23 0524 04/17/23 1317 04/17/23 1950  04/18/23 0545  BP: (!) 150/92 126/73 131/81 135/80    9: GERD: continue Protonix 40mg  BID   10: Current History of prostate cancer with mets to spine s/p decompressive laminectomy T3 and evacuation of epidural tumor Dr. Wynetta Emery 09/26             -continue Xtandi             -continue Decadron 4mg  q6h             -no brace needed; no twisting, arching, bending             -follow-up with Dr. Wynetta Emery, Dr. Mosetta Putt and Dr. Kathrynn Running   -10/11- starting radiation next week per notes-   10/14- starting radiation this week 11: Anemia: multifactorial             -follow-up CBC   -10/10- Hb 9.9 a little better 12. Constipation, cont miralax BID and senokotS 1 tab BID -  10/10- will try Sorbitol 15cc- since doesn't want to blow out- since LBM 2 days ago- and feels constipated. -10/11- didn't have BM- will order Sorbitol 30cc and pt to take prune juice at lunch- ordered at 4pm.  -04/17/23 LBM yesterday, cont regimen  10/14- LBM x2 days ago- con't regimen   13. Heart murmur:  -04/16/23 pt noted to have heart murmur, has been mentioned to him before, doubt need for emergent w/up although might need cardiology f/up for echo/eval.    I spent a total of 36   minutes on total care today- >50% coordination of care- due to  Called SW to verify set up for radiation- d/w SW about wife losing vision in 1 eye- explained to pt Surgery decides if/when he can drive. Reviewed AM Labs and vitals as well    LOS: 5 days A FACE TO FACE EVALUATION WAS PERFORMED  Danil Wedge 04/18/2023, 9:06 AM

## 2023-04-18 NOTE — Progress Notes (Signed)
Occupational Therapy Session Note  Patient Details  Name: Nathan Berg MRN: [id number] Date of Birth: [birth year]  Today's Date: 04/18/2023 OT Individual Time: 1115-1200 and 1500-1530 OT Individual Time Calculation (min): 45 min and 30 min    Short Term Goals: Week 1:  OT Short Term Goal 1 (Week 1): STG=LTG d/t ELOS  Skilled Therapeutic Interventions/Progress Updates:    Visit 1: Pain: no c/o pain during session  Pt received in wc and stated he really wants to focus on LE strength to be able to ambulate, go up and down stairs, and drive.  Pt worked on sit to stand from [employer] with cue to push up from chair with supervision and then placed hands on RW. Ambulated with min A to gym due to some initial knee buckling and internal rotation of LLE.   In gym pt sat on mat and worked on Leg exercises of isometric knee extensions with hip flexor activation,  active knee extensions with brief hold,  hip abduction, plantar flexion with dorsiflexion stretch using looped green band around his ankles. Pt able to don and doff band around ankles with maintaining back precautions.    Pt then ambulated back to room halfway and then self propelled wc to room.  Pt opted to get in bed to rest.  Pt resting in bed with all needs met. Alarm set and call light in reach.    Visit 2:  Pain:  pt c/o shoulder and sternal pain from exercises prior to start of session. Modified exercises to enable pt to participate without discomfort. Pt stated his legs were really tired and he did not feel that he could tolerate standing or walking this session. Focused on general strength with chair push ups 5x, then 8x.,  single arm lat pull downs and single arm rows with green theraband.   Pt talked quite a bit between exercises expressing his concerns about managing things at home including driving.  Pt opted to rest in recliner at end of session. Pt resting in recliner with all needs met. Alarm set and call light in reach.      Therapy Documentation Precautions:  Precautions Precautions: Fall, Back Precaution Comments: no brace needed; recalled 3/3 precautions without cue Restrictions Weight Bearing Restrictions: No    Vital Signs: Therapy Vitals Temp: 97.8 F (36.6 C) Pulse Rate: 66 Resp: 17 BP: 135/80 Patient Position (if appropriate): Lying Oxygen Therapy SpO2: 98 % O2 Device: Room Air Pain: Pain Assessment Pain Scale: 0-10 Pain Score: Asleep ADL: ADL Equipment Provided: Long-handled sponge Eating: Set up Where Assessed-Eating: Chair Grooming: Setup Where Assessed-Grooming: Sitting at sink Upper Body Bathing: Supervision/safety Where Assessed-Upper Body Bathing: Shower Lower Body Bathing: Supervision/safety Where Assessed-Lower Body Bathing: Shower Upper Body Dressing: Supervision/safety Where Assessed-Upper Body Dressing: Chair Lower Body Dressing: Contact guard Where Assessed-Lower Body Dressing: Sitting at sink, Standing at sink Toileting: Contact guard Where Assessed-Toileting: Teacher, adult education: Furniture conservator/restorer Method: Proofreader: Raised toilet seat Tub/Shower Transfer: Not assessed Film/video editor: Administrator, arts Method: Designer, industrial/product: Grab bars, Transfer tub bench    Therapy/Group: Individual Therapy  Kayonna Lawniczak 04/18/2023, 8:37 AM

## 2023-04-18 NOTE — Progress Notes (Signed)
Physical Therapy Session Note  Patient Details  Name: Nathan Berg MRN: [id number] Date of Birth: 11-Apr-1956  Today's Date: 04/18/2023 PT Individual Time: 1002-1056 PT Individual Time Calculation (min): 54 min   Short Term Goals: Week 1:  PT Short Term Goal 1 (Week 1): Pt will ambulate >100 ft with RW PT Short Term Goal 2 (Week 1): Pt will navigate stairs per home environment with assist PT Short Term Goal 3 (Week 1): Pt will sit OOB for whole day of therapy.  Skilled Therapeutic Interventions/Progress Updates:    Pt received in recliner and agreeable to therapy.  Pt reports 3/10 pain at rest, increased with activity, premedicated. Rest and positioning provided as needed. Pt reports sternal/rib pain that worsened with anterior lean and inspiration. Noted ribs 7/8 seem to be tender to palpation.   Pt ambulated with RW with min A/w/c follow in case of knee instability. Note some LLE scissoring but no LOB. Cues provided.   Interventions focused on stepping coordination and glute strength. Stepping over sticks for coordination x 3 bouts, pt able to perform but reports increased difficulty with fatigue.   Attempted tall kneeling squats, pt was able get into position, but not sink back onto heels d/t sternum pain. Discussed pain at length. Pt was able to perform reaching activity with 3.3 lb weighted ball with cue to maintain hip extension. Mild LOB but did not require assist.   Pt also performed Sit to stand 3 x 6 with 3.3 lb ball, cues for increased anterior weight shift and hip hinge, improved mechanics by final reps. Pt returned to room and remained in w/c to await  OT.   Therapy Documentation Precautions:  Precautions Precautions: Fall, Back Precaution Comments: no brace needed; recalled 3/3 precautions without cue Restrictions Weight Bearing Restrictions: No General:       Therapy/Group: Individual Therapy  Juluis Rainier 04/18/2023, 12:55 PM

## 2023-04-18 NOTE — Progress Notes (Signed)
Physical Therapy Session Note  Patient Details  Name: Nathan Berg MRN: [id number] Date of Birth: [birth year]  Today's Date: 04/18/2023 PT Individual Time: 1300-1400 PT Individual Time Calculation (min): 60 min   Short Term Goals: Week 1:  PT Short Term Goal 1 (Week 1): Pt will ambulate >100 ft with RW PT Short Term Goal 2 (Week 1): Pt will navigate stairs per home environment with assist PT Short Term Goal 3 (Week 1): Pt will sit OOB for whole day of therapy.  Skilled Therapeutic Interventions/Progress Updates:    Pt seated in w/c on arrival and agreeable to therapy. Pt reports up to 9/10 pain a few minutes prior when getting out of bed, but now 5/10 at rest, premedicated. Rest and positioning provided as needed.   Pt propelled w/c with BLE to [facility] entrance for therapeutic benefit of outdoor environment. Pt then participated in Sit to stand 4 x 6 with GTB around knees for adduction error augmentation. Required extended rest breaks for energy conservation. Pt then ambulated ~150 ft nearing CGA level but still demoes knee instability requiring w/c follow. Limited by fatigue this session, but scissoring somewhat improved.   On return to room, pt requested to use bathroom. ambulatory transfer to bathroom with min A stand>sit with min A. Pt handed off to NT for completion of toileting.   Therapy Documentation Precautions:  Precautions Precautions: Fall, Back Precaution Comments: no brace needed; recalled 3/3 precautions without cue Restrictions Weight Bearing Restrictions: No General:       Therapy/Group: Individual Therapy  Juluis Rainier 04/18/2023, 3:00 PM

## 2023-04-19 ENCOUNTER — Ambulatory Visit: Payer: Medicare Other

## 2023-04-19 ENCOUNTER — Ambulatory Visit: Payer: Medicare Other | Admitting: Radiation Oncology

## 2023-04-19 ENCOUNTER — Inpatient Hospital Stay: Payer: Medicare Other

## 2023-04-19 ENCOUNTER — Ambulatory Visit: Payer: Medicare Other | Admitting: Hematology

## 2023-04-19 ENCOUNTER — Ambulatory Visit
Admit: 2023-04-19 | Discharge: 2023-04-19 | Disposition: A | Discharge status: Home / Self Care | Payer: Medicare Other | Attending: Radiation Oncology | Admitting: Radiation Oncology

## 2023-04-19 ENCOUNTER — Other Ambulatory Visit: Payer: Self-pay

## 2023-04-19 DIAGNOSIS — M4804 Spinal stenosis, thoracic region: Secondary | ICD-10-CM | POA: Diagnosis not present

## 2023-04-19 DIAGNOSIS — Z510 Encounter for antineoplastic radiation therapy: Secondary | ICD-10-CM | POA: Diagnosis not present

## 2023-04-19 DIAGNOSIS — C61 Malignant neoplasm of prostate: Secondary | ICD-10-CM | POA: Diagnosis not present

## 2023-04-19 DIAGNOSIS — F4320 Adjustment disorder, unspecified: Secondary | ICD-10-CM | POA: Diagnosis not present

## 2023-04-19 DIAGNOSIS — S22030D Wedge compression fracture of third thoracic vertebra, subsequent encounter for fracture with routine healing: Secondary | ICD-10-CM | POA: Diagnosis not present

## 2023-04-19 LAB — RAD ONC ARIA SESSION SUMMARY
Course Elapsed Days: 0
Plan Fractions Treated to Date: 1
Plan Prescribed Dose Per Fraction: 8 Gy
Plan Total Fractions Prescribed: 5
Plan Total Prescribed Dose: 40 Gy
Reference Point Dosage Given to Date: 8 Gy
Reference Point Session Dosage Given: 8 Gy
Session Number: 1

## 2023-04-19 MED ORDER — IBUPROFEN 400 MG PO TABS
400.0000 mg | ORAL_TABLET | Freq: Four times a day (QID) | ORAL | Status: DC | PRN
  Administered 2023-04-20: 400 mg via ORAL
  Filled 2023-04-19: qty 1

## 2023-04-19 MED ORDER — GABAPENTIN 400 MG PO CAPS
400.0000 mg | ORAL_CAPSULE | Freq: Three times a day (TID) | ORAL | Status: DC
  Administered 2023-04-19 – 2023-04-20 (×5): 400 mg via ORAL
  Filled 2023-04-19 (×5): qty 1

## 2023-04-19 MED ORDER — LEUPROLIDE ACETATE (4 MONTH) 30 MG ~~LOC~~ KIT
30.0000 mg | PACK | Freq: Once | SUBCUTANEOUS | Status: AC
  Administered 2023-04-20: 30 mg via SUBCUTANEOUS
  Filled 2023-04-19 (×2): qty 30

## 2023-04-19 MED ORDER — ZOLEDRONIC ACID 4 MG/100ML IV SOLN
4.0000 mg | Freq: Once | INTRAVENOUS | Status: DC
  Filled 2023-04-19 (×2): qty 100

## 2023-04-19 MED ORDER — HYDROMORPHONE HCL 2 MG PO TABS
2.0000 mg | ORAL_TABLET | ORAL | Status: DC | PRN
  Administered 2023-04-19: 2 mg via ORAL
  Administered 2023-04-19 – 2023-04-20 (×6): 4 mg via ORAL
  Administered 2023-04-21 (×3): 2 mg via ORAL
  Administered 2023-04-21: 4 mg via ORAL
  Administered 2023-04-21: 2 mg via ORAL
  Administered 2023-04-22: 4 mg via ORAL
  Administered 2023-04-22: 2 mg via ORAL
  Administered 2023-04-22 (×2): 4 mg via ORAL
  Administered 2023-04-23: 2 mg via ORAL
  Administered 2023-04-23 – 2023-04-27 (×13): 4 mg via ORAL
  Administered 2023-04-27: 2 mg via ORAL
  Administered 2023-04-27 – 2023-04-28 (×2): 4 mg via ORAL
  Administered 2023-04-28 (×2): 2 mg via ORAL
  Administered 2023-04-28 – 2023-04-29 (×3): 4 mg via ORAL
  Administered 2023-04-29: 2 mg via ORAL
  Administered 2023-04-29: 4 mg via ORAL
  Administered 2023-04-30: 2 mg via ORAL
  Administered 2023-04-30: 4 mg via ORAL
  Filled 2023-04-19 (×18): qty 2
  Filled 2023-04-19: qty 1
  Filled 2023-04-19 (×24): qty 2

## 2023-04-19 NOTE — Progress Notes (Signed)
PROGRESS NOTE   Subjective/Complaints:   Pt reports main pain is at level SCI pain- so tight- hard when stretches- back also burns some- pain meds he's taking "don't help much"- explained it responds better to nerve pain meds- not opiates.  Lidoderm didn't help him in past.  Heat helps some.  Muscle relaxers don't help.  LBM this AM  ROS: See HPI   Pt denies SOB, abd pain, CP, N/V/C/D, and vision changes  Pain limiting (+) pain his At level T3 SCI pain  Objective:   No results found. Recent Labs    04/18/23 0534  WBC 5.4  HGB 9.1*  HCT 27.8*  PLT 353    Recent Labs    04/18/23 0534  NA 135  K 5.0  CL 100  CO2 28  GLUCOSE 109*  BUN 19  CREATININE 1.05  CALCIUM 8.5*     Intake/Output Summary (Last 24 hours) at 04/19/2023 0909 Last data filed at 04/19/2023 1610 Gross per 24 hour  Intake 717 ml  Output 350 ml  Net 367 ml        Physical Exam: Vital Signs Blood pressure 129/64, pulse 65, temperature 98.6 F (37 C), resp. rate 17, height 5\' 10"  (1.778 m), weight 86.9 kg, SpO2 100%.       General: awake, alert, appropriate,  sitting up in bedside chair; NAD HENT: conjugate gaze; oropharynx moist CV: regular rate and rhythm; no JVD Pulmonary: CTA B/L; no W/R/R- good air movement GI: soft, NT, ND, (+)BS Psychiatric: appropriate Neurological: Ox3 No signs of spasticity Has T3 TTP- c/o feeling tight PRIOR EXAMS: Skin- posterior Cervical spine incision- steristrips intact Neuro:  Alert and oriented x 3, follows commands, CN 2-12 intact, no speech or language deficits noted, good insight and awareness RUE: 5/5 Deltoid, 5/5 Biceps, 5/5 Triceps, 5/5 Wrist Ext, 5/5 Grip LUE: 4/5 Deltoid, 5/5 Biceps, 5/5 Triceps, 5/5 Wrist Ext, 5/5 Grip RLE: HF 5/5, KE 5/5, ADF 5/5, APF 5/5 LLE: HF 5/5, KE 5/5,  ADF 5/5, APF 5/5 Sensory exam normal for light touch and pain in all 4 limbs. No limb ataxia or  cerebellar signs. No abnormal tone appreciated.  No ankle clonus Musculoskeletal: L shoulder pain and crepitus with PROM, no significant L shoulder tenderness   .  Assessment/Plan: 1. Functional deficits which require 3+ hours per day of interdisciplinary therapy in a comprehensive inpatient rehab setting. Physiatrist is providing close team supervision and 24 hour management of active medical problems listed below. Physiatrist and rehab team continue to assess barriers to discharge/monitor patient progress toward functional and medical goals  Care Tool:  Bathing    Body parts bathed by patient: Right arm, Left lower leg, Face, Left arm, Chest, Abdomen, Front perineal area, Buttocks, Right upper leg, Left upper leg, Right lower leg         Bathing assist Assist Level: Supervision/Verbal cueing     Upper Body Dressing/Undressing Upper body dressing   What is the patient wearing?: Pull over shirt    Upper body assist Assist Level: Supervision/Verbal cueing    Lower Body Dressing/Undressing Lower body dressing            Lower body  assist Assist for lower body dressing: Contact Guard/Touching assist     Toileting Toileting    Toileting assist Assist for toileting: Supervision/Verbal cueing     Transfers Chair/bed transfer  Transfers assist     Chair/bed transfer assist level: Minimal Assistance - Patient > 75%     Locomotion Ambulation   Ambulation assist      Assist level: Minimal Assistance - Patient > 75% Assistive device: Walker-rolling Max distance: 84 ft   Walk 10 feet activity   Assist     Assist level: Minimal Assistance - Patient > 75% Assistive device: Walker-rolling   Walk 50 feet activity   Assist    Assist level: Minimal Assistance - Patient > 75% Assistive device: Walker-rolling    Walk 150 feet activity   Assist Walk 150 feet activity did not occur: Safety/medical concerns         Walk 10 feet on uneven surface   activity   Assist Walk 10 feet on uneven surfaces activity did not occur: Safety/medical concerns         Wheelchair     Assist Is the patient using a wheelchair?: Yes Type of Wheelchair: Manual    Wheelchair assist level: Supervision/Verbal cueing Max wheelchair distance: 150 ft    Wheelchair 50 feet with 2 turns activity    Assist        Assist Level: Supervision/Verbal cueing   Wheelchair 150 feet activity     Assist      Assist Level: Supervision/Verbal cueing   Blood pressure 129/64, pulse 65, temperature 98.6 F (37 C), resp. rate 17, height 5\' 10"  (1.778 m), weight 86.9 kg, SpO2 100%.  Medical Problem List and Plan: 1. 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              -Plan for radiation treatments week of 10/14             -patient may shower             -ELOS/Goals: 10-12, PT/OT Supervision              -Admit to CIR   Con't CIR PT and OT Team conference today to determine length of stay  D/w SW about radiation and oncology appointments- she will address  - per pt, needs to go home Mod I due to wife not being able ot help physically and is partially blind right now- new 2.  Antithrombotics: -DVT/anticoagulation:  Mechanical:  Antiembolism stockings, knee (TED hose) Bilateral lower extremities. Awaiting call back from NS regarding initiating pharmacologic DVT prophylaxis-- now on Lovenox 40mg  BID             -antiplatelet therapy: none   3. Pain Management: Tylenol as needed             -continue Flexeril 10 mg TID prn             -continue Duragesic patch 12/mcg/hr q 72 hours -continue gabapentin 300 mg TID             -continue Lidoderm              -continue oxycodone 10 mg q 4 hrs prn>> d/c'd             -continue ibuprofen 400 mg q 6 hrs prn -10/10- will change Duragesic to 25 mcg q3 days as well as  reduce  Oxy to 5 mg q4 hours prn- since feels like patch more effective than Oxy.  -10/11- pain slightly better, but Oxy doesn't work- will change Oxy to Dilaudid 2-4 mg q4 hours prn and maintain Duragesic patch 25 mcg- pt happy with change.  -04/17/23 pain intensity worse today but worked hard yesterday; not needing pain meds too much before they're "due" so will leave meds in place for now; defer adjustments to weekday team if needed.  10/14- pain adequate control per pt- will con't current regimen for now 10/15- main pain is at level SCI pain- will increase gabapentin to 400 mg TID_ if tolerates, will go to 600 mg TID- cannot add Duloxetine since on high dose of Lexapro-  4. Mood/Behavior/Sleep: LCSW to evaluate and provide emotional support             -History of GAD             -continue Lexapro 30 mg daily             -continue Seroquel 200 mg q HS             -continue trazodone 25 mg q HS             -continue Xanax 0.25 mg BID prn             -continue melatonin 3 mg q HS prn              10/14- sleeping well  10/15- somewhat anxious today since schedule of therapy and radiation/oncology appts overlap- d/w SW_ will look into it- Oncology takes priority.  5. Neuropsych/cognition: This patient is capable of making decisions on his own behalf.   6. Skin/Wound Care: Routine skin care checks             -monitor incision>>keep the Steri-Strips dry during showers per Dr. Wynetta Emery   7. Fluids/Electrolytes/Nutrition: Routine Is and Os and follow-up chemistries             -continue Mag-ox 400 mg BID             -continue B12 1000 mcg daily             -Hypokalemia/Hyponatremia- Recheck labs tomorrow -10/10- K+ 5.0- and Na 137- had been hypokalemic- so likely hemolysis- wil recheck Monday 10/15- K+ 5.0- top end of normal 8: Hypertension: controlled, monitor TID and prn   -04/17/23 BPs sort of variable but mostly ok, monitor for now.   10/14- BP doing better- more controlled- con't  regimen  10/15- doing OK except 1 value-  Vitals:   04/15/23 1943 04/16/23 0514 04/16/23 1321 04/16/23 1951  BP: 132/84 (!) 148/90 131/75 130/79   04/17/23 0524 04/17/23 1317 04/17/23 1950 04/18/23 0545  BP: (!) 150/92 126/73 131/81 135/80   04/18/23 1247 04/18/23 1900 04/19/23 0451 04/19/23 0456  BP: 129/87 (!) 134/97 (!) 159/89 129/64    9: GERD: continue Protonix 40mg  BID   10: Current History of prostate cancer with mets to spine s/p decompressive laminectomy T3 and evacuation of epidural tumor Dr. Wynetta Emery 09/26             -continue Xtandi             -continue Decadron 4mg  q6h             -no brace needed; no twisting, arching, bending             -follow-up with Dr. Wynetta Emery, Dr. Mosetta Putt and Dr. Kathrynn Running   -  10/11- starting radiation next week per notes-   10/14- starting radiation this week  10/15- starting today- d/w pharmacy- restarting Zometa and Lupron per Dr Cherly Hensen and pharmacy.  11: Anemia: multifactorial             -follow-up CBC   -10/10- Hb 9.9 a little better 12. Constipation, cont miralax BID and senokotS 1 tab BID -10/10- will try Sorbitol 15cc- since doesn't want to blow out- since LBM 2 days ago- and feels constipated. -10/11- didn't have BM- will order Sorbitol 30cc and pt to take prune juice at lunch- ordered at 4pm.  -04/17/23 LBM yesterday, cont regimen  10/14- LBM x2 days ago- con't regimen   10/15- LBM this AM 13. Heart murmur:  -04/16/23 pt noted to have heart murmur, has been mentioned to him before, doubt need for emergent w/up although might need cardiology f/up for echo/eval.     I spent a total of 51   minutes on total care today- >50% coordination of care- due to  D/w SW about schedule that's overlapping.  Also d/w pharmacy about meds for Lupron, etc and team conference to determine length of stay.    LOS: 6 days A FACE TO FACE EVALUATION WAS PERFORMED  Saysha Menta 04/19/2023, 9:09 AM

## 2023-04-19 NOTE — Progress Notes (Signed)
Physical Therapy Session Note  Patient Details  Name: Nathan Berg MRN: [id number] Date of Birth: 12/31/1955  Today's Date: 04/19/2023 PT Individual Time: 1345-1415 PT Individual Time Calculation (min): 30 min   Short Term Goals: Week 1:  PT Short Term Goal 1 (Week 1): Pt will ambulate >100 ft with RW PT Short Term Goal 2 (Week 1): Pt will navigate stairs per home environment with assist PT Short Term Goal 3 (Week 1): Pt will sit OOB for whole day of therapy.  Skilled Therapeutic Interventions/Progress Updates:    Pt received in recliner and agreeable to therapy.  Pain did not interfere with therapy this session.  Pt ambulated ~ 100 ft with RW and CGA, w/c follow for safety. Note improved ataxia and flexed knee posture, but still intermittent  mild scissoring. Pt then propelled w/c with BLE >50 ft x 2 for LE strength and endurance. On return to room, pt participated in marching xx 20 with 5 lb ankle weights. Pt reports fatigue, discussed using appropriate weight for muscle strength but also providing rest for recovery. At this time, [facility] arrived for transport for radiation. Pt handed off to transport team. Missed x 45 min for radiation tx transport, will make up as schedule allows.   Therapy Documentation Precautions:  Precautions Precautions: Fall, Back Precaution Comments: no brace needed; recalled 3/3 precautions without cue Restrictions Weight Bearing Restrictions: No General: PT Amount of Missed Time (min): 45 Minutes PT Missed Treatment Reason: Out of hospital appointment    Therapy/Group: Individual Therapy  Juluis Rainier 04/19/2023, 2:32 PM

## 2023-04-19 NOTE — Consult Note (Signed)
04/19/2023 3 PM: Attempted to see patient for neuropsych consult but he has not returned from [facility] for his radiation treatment without unexpected return before 5.  I will work on trying to see him tomorrow.

## 2023-04-19 NOTE — Patient Care Conference (Signed)
Inpatient RehabilitationTeam Conference and Plan of Care Update Date: 04/19/2023   Time: 11:01 AM    Patient Name: Nathan Berg      Medical Record Number: [id number]  Date of Birth: 22-May-1956 Sex: Male         Room/Bed: 4M09C/4M09C-01 Payor Info: Payor: MEDICARE / Plan: MEDICARE PART A AND B / Product Type: *No Product type* /    Admit Date/Time:  04/13/2023  6:20 PM  Primary Diagnosis:  Spinal stenosis, thoracic  Hospital Problems: Principal Problem:   Spinal stenosis, thoracic Active Problems:   Prostate cancer (HCC)   Compression fracture of T3 vertebra (HCC)   Anemia   Primary hypertension    Expected Discharge Date: Expected Discharge Date: 04/30/23  Team Members Present: Physician leading conference: Dr. Genice Rouge Social Worker Present: Cecile Sheerer, LCSWA Nurse Present: Vedia Pereyra, RN PT Present: Bernie Covey, PT OT Present: Roney Mans, OT PPS Coordinator present : Fae Pippin, SLP     Current Status/Progress Goal Weekly Team Focus  Bowel/Bladder   continent to bowel and bladder last BM 10/14   pt to remain continent   Assist pt with toileting need    Swallow/Nutrition/ Hydration               ADL's   supervision overall   Mod I overall   activity tolerance, balance, ADL training, pt education    Mobility   MIN-cga gait and transfers, limited by knee instability/buckling and pain   mod I transfers, supervision gait  knee buckling, pain management    Communication                Safety/Cognition/ Behavioral Observations               Pain   patient pain controlled on current regimen, requiring  dilaudid every 4-6 hours with advill for break through pain. pain goal not met   pain goal <3/10   assess every shift and  prn    Skin   steri strips to midback, incison closed at base. 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 There are limited natural supports to assist with transportation to appointments. [place]. Pt wife reports pt needs to be able to walk upon returning home. Pt has radiaiton treatments on !0/15, 17,21,23,and 25. PET scan will be rescheduled per oncology physician. SW Will confirm there are no barriers to discharge.   Team Discussion: Spinal stenosis. Continent of bowel/bladder. Pain managed with Duragesic patch PRN medications.  Burning at level of injury. Incision to back with steri strips left OTA.  Radiation through the 25th. Tolerating regular diet. Fatigues quickly with ataxia noted.  Patient on target to meet rehab goals: yes, will continue to progress towards goal with discharge date of 04/30/23  *See Care Plan and progress notes for long and short-term goals.   Revisions to Treatment Plan:  Oncology and radiation.  Gabapentin added. Monitor labs/VS Teaching Needs: Medications, safety, self care, gait/transfer training, incision care, etc.   Current Barriers to Discharge: Decreased caregiver support, Wound care, and Lack of/limited family support  Possible Resolutions to Barriers: Family  education Independent with incision care Order recommended DME     Medical Summary Current Status: conitnent- taking dilaudid 2-4 mg and Duragesic for pain- at level SCI pain-  Barriers to Discharge: Behavior/Mood;Self-care education;Weight bearing restrictions;Uncontrolled Pain;Pending chemo/radiation;Medical stability  Barriers to Discharge Comments: wife has vision issues- cannot drive- other limitations- pain; and radiation for current cancer- - knee buckling- some anxiety; poor endurance and ataxia; Possible Resolutions to [employer] Focus: increased  gabapentin to 400 mg TID-  walking >155ft RW- goals mod I- has significant endurance issues- 10/26- d/c   Continued Need for Acute Rehabilitation Level of Care: The patient requires daily medical management by a physician with specialized training in physical medicine and rehabilitation for the following reasons: Direction of a multidisciplinary physical rehabilitation program to maximize functional independence : Yes Medical management of patient stability for increased activity during participation in an intensive rehabilitation regime.: Yes Analysis of laboratory values and/or radiology reports with any subsequent need for medication adjustment and/or medical intervention. : Yes   I attest that I was present, lead the team conference, and concur with the assessment and plan of the team.   Jearld Adjutant 04/19/2023, 2:51 PM

## 2023-04-19 NOTE — Progress Notes (Addendum)
Patient picked [facility] by [facility]. 1645-Patient returned from the cancer center by [facility].

## 2023-04-19 NOTE — Progress Notes (Signed)
Patient ID: ELON EOFF, male   DOB: 1956/05/17, 67 y.o.   MRN: [id number]  SW went by pt room ro provide updates  but pt not present. SW will follow-up with pt to discuss.   1430- SW spoke with pt wife Randa Evens to provide updates from team conference, and d/c date 10/26. SW discussed HH vs Outpatient. Prefers to begin with HH first. SW discussed DME- RW and TTB; likely will need 3in1 BSC and transport chair. SW will confirm. Pt wife intends to see if a friend has a RW.   SW scheduled transportation with [facility] for 10/25 for 12:30pm appt; pick up at 1145am.   Cecile Sheerer, MSW, LCSWA Office: [phone] Cell: [phone] Fax: [phone]

## 2023-04-19 NOTE — Progress Notes (Signed)
Occupational Therapy Session Note  Patient Details  Name: Nathan Berg MRN: [id number] Date of Birth: [birth year]  Today's Date: 04/19/2023 OT Individual Time: 4098-1191 & 4782-9562 OT Individual Time Calculation (min): 73 min & 44 min   Short Term Goals: Week 1:  OT Short Term Goal 1 (Week 1): STG=LTG d/t ELOS  Skilled Therapeutic Interventions/Progress Updates:      Therapy Documentation Precautions:  Precautions Precautions: Fall, Back Precaution Comments: no brace needed; recalled 3/3 precautions without cue Restrictions Weight Bearing Restrictions: No General: "I would like to shower." Pt seated in recliner upon OT arrival, agreeable to OT.  Pain:  unrated general pain reported, activity, intermittent rest breaks, distractions provided for pain management, pt reports tolerable to proceed. Nurse medicated patient at beginning of session.  ADL:  Grooming: Oral hygiene: Toilet transfer: SBA with RW ambulating from recliner>standard toilet Toileting: SBA, able to manage pants and hygiene after voiding BM and urine UB dressing: SBA  for doffing/donning overhead shirt seated on TTB LB dressing: Min A, assistance with threading off of Lt foot Footwear: SBA using figure 4 method Shower transfer: SBA with RW ambulating from toilet>TTB Bathing: SBA, able to wash/rinse/dry all body parts Transfers: SBA overall with RW  Balance Pt ambulated throughout therapy gym with RW at [facility] level to complete scavenger hunt to retrieve items. Pt required VC for turns. Pt retrieved 10 washcloths and navigated ramp with RW in therapy gym. One rest break during activity. Pt completed activity in order to increase functional activity tolerance, balance when reaching out of BOS.    Pt seated in recliner at end of session with W/C alarm donned, call light within reach and 4Ps assessed.    Session 2 General: "Hi again!" Pt seated in recliner upon OT arrival, agreeable to OT.  Pain:  low  unrated pain generalized, activity, intermittent rest breaks, distractions provided for pain management, pt reports tolerable to proceed.   ADL: Toilet transfer: SBA with RW ambulating from recliner><toilet Toileting: SBA with RW, able to stabilize and manage hygiene and pants  Exercises: Pt completed the following exercise circuit in order to improve functional activity, strength and endurance to prepare for ADLs such as bathing. Pt completed the following exercises in seated/standing position with no noted LOB/SOB and 3x10 repetitions on each exercise: -bicep curls -forward punches -shoulder flexion -ambulating with RW on uneven terrain outdoors CGA with RW, no LOB/SOB -10x sit to stand transfers with no UE support   Pt seated in W/C at end of session with W/C alarm donned, call light within reach and 4Ps assessed.    Therapy/Group: Individual Therapy  Velia Meyer, OTD, OTR/L 04/19/2023, 4:10 PM

## 2023-04-20 ENCOUNTER — Ambulatory Visit: Payer: Medicare Other | Admitting: Radiation Oncology

## 2023-04-20 ENCOUNTER — Other Ambulatory Visit: Payer: Self-pay | Admitting: Hematology

## 2023-04-20 ENCOUNTER — Other Ambulatory Visit: Payer: Self-pay

## 2023-04-20 ENCOUNTER — Ambulatory Visit: Payer: Medicare Other

## 2023-04-20 DIAGNOSIS — E538 Deficiency of other specified B group vitamins: Secondary | ICD-10-CM | POA: Diagnosis not present

## 2023-04-20 DIAGNOSIS — C61 Malignant neoplasm of prostate: Secondary | ICD-10-CM | POA: Diagnosis not present

## 2023-04-20 DIAGNOSIS — M4714 Other spondylosis with myelopathy, thoracic region: Secondary | ICD-10-CM | POA: Diagnosis not present

## 2023-04-20 MED ORDER — ACYCLOVIR 200 MG PO CAPS
800.0000 mg | ORAL_CAPSULE | Freq: Every day | ORAL | Status: DC
  Administered 2023-04-20 – 2023-04-21 (×4): 800 mg via ORAL
  Filled 2023-04-20 (×6): qty 4

## 2023-04-20 MED ORDER — ZOLEDRONIC ACID 4 MG/100ML IV SOLN
4.0000 mg | Freq: Once | INTRAVENOUS | Status: AC
  Administered 2023-04-20: 4 mg via INTRAVENOUS
  Filled 2023-04-20 (×2): qty 100

## 2023-04-20 NOTE — Plan of Care (Signed)
  Problem: Consults Goal: RH SPINAL CORD INJURY PATIENT EDUCATION Description:  See Patient Education module for education specifics.  Outcome: Progressing Goal: Skin Care Protocol Initiated - if Braden Score 18 or less Description: If consults are not indicated, leave blank or document N/A Outcome: Progressing Goal: Nutrition Consult-if indicated Outcome: Progressing Goal: Diabetes Guidelines if Diabetic/Glucose > 140 Description: If diabetic or lab glucose is > 140 mg/dl - Initiate Diabetes/Hyperglycemia Guidelines & Document Interventions  Outcome: Progressing   Problem: SCI BOWEL ELIMINATION Goal: RH STG MANAGE BOWEL WITH ASSISTANCE Description: STG Manage Bowel with Mod I Assistance. Outcome: Progressing Goal: RH STG SCI MANAGE BOWEL WITH MEDICATION WITH ASSISTANCE Description: STG SCI Manage bowel with medication with mod I assistance. Outcome: Progressing Goal: RH STG MANAGE BOWEL W/EQUIPMENT W/ASSISTANCE Description: STG Manage Bowel With Equipment With Mod I Assistance Outcome: Progressing Goal: RH OTHER STG BOWEL ELIMINATION GOALS W/ASSIST Description: Other STG Bowel Elimination Goals With Mod I Assistance. Outcome: Progressing   Problem: SCI BLADDER ELIMINATION Goal: RH STG MANAGE BLADDER WITH ASSISTANCE Description: STG Manage Bladder With Mod I Assistance Outcome: Progressing Goal: RH STG MANAGE BLADDER WITH MEDICATION WITH ASSISTANCE Description: STG Manage Bladder With Medication With Mod I Assistance. Outcome: Progressing Goal: RH OTHER STG BLADDER ELIMINATION GOALS W/ASSIST Description: Other STG Bladder Elimination Goals With Assistance Outcome: Progressing   Problem: RH SKIN INTEGRITY Goal: RH STG SKIN FREE OF INFECTION/BREAKDOWN Outcome: Progressing Goal: RH STG MAINTAIN SKIN INTEGRITY WITH ASSISTANCE Description: STG Maintain Skin Integrity With Assistance. Outcome: Progressing   Problem: RH KNOWLEDGE DEFICIT SCI Goal: RH STG INCREASE KNOWLEDGE OF  SELF CARE AFTER SCI Outcome: Progressing   Problem: RH SAFETY Goal: RH STG ADHERE TO SAFETY PRECAUTIONS W/ASSISTANCE/DEVICE Description: STG Adhere to Safety Precautions With Assistance/Device. Outcome: Progressing   Problem: Education: Goal: Ability to verbalize activity precautions or restrictions will improve Outcome: Progressing Goal: Knowledge of the prescribed therapeutic regimen will improve Outcome: Progressing Goal: Understanding of discharge needs will improve Outcome: Progressing

## 2023-04-20 NOTE — Progress Notes (Signed)
Notified by nursing staff of vesicular and bullous lesions along left upper anterior chest.  The patient denies itching, burning or tingling.  Lesions are in a dermatome distribution.  I discussed antiviral therapy with pharmacy as patient is currently actively treated for prostate cancer.  Start acyclovir 800 mg daily for 7 days.

## 2023-04-20 NOTE — Progress Notes (Signed)
Recreational Therapy Session Note  Patient Details  Name: Nathan Berg MRN: [id number] Date of Birth: February 20, 1956 Today's Date: 04/20/2023  Pain: no c/o Skilled Therapeutic Interventions/Progress Updates: Pt participated in animal assisted activity seated w/c level.  Pt easily engaged with pet partner team & was appreciative of this visit.    Baylynn Shifflett 04/20/2023, 1:32 PM

## 2023-04-20 NOTE — Progress Notes (Signed)
Physical Therapy Session Note  Patient Details  Name: Nathan Berg MRN: [id number] Date of Birth: [birth year]  Today's Date: 04/20/2023 PT Individual Time: 1300-1341 PT Individual Time Calculation (min): 41 min   Short Term Goals: Week 1:  PT Short Term Goal 1 (Week 1): Pt will ambulate >100 ft with RW PT Short Term Goal 2 (Week 1): Pt will navigate stairs per home environment with assist PT Short Term Goal 3 (Week 1): Pt will sit OOB for whole day of therapy.  Skilled Therapeutic Interventions/Progress Updates:     Therapist donned PPE for contact/airborne precuations 2/2 shingles. Pt seated in recliner upon arrival. Pt agreeable to therapy. Pt denies any pain. Pt reports concerns for his wife as she is having eye surgery tomorrow for detached retina.   Pt reports need to use bathroom, pt continent of urine, pt performed sit to stand with RW and  supervision, pt donned and doffed pants and performed pericare with  supervision . Pt ambulated to and froom bathroom with RW and   CGA with one episode of LOB with L LE scissoring, pt able to self correct, no evidence of knee buckling   Pt performed sit to stand 2x10 with no AD, verbal cues provided with technique, and upright posture with knee extension with emphasis on butt back for descent.    Pt ambulated 1x190 with RW and CGA/close supervision, pt demos improved gait with less scissoring and no buckling, with cues for navigating turns.   Pt ambulated 1x10, and 2x30 feet with RW and no AD and L HHA with min -A, verbal cues provided for increased step length, and knee extension.  Pt seated in recliner at end of session with all needs within reach and chair alarm on.        Therapy Documentation Precautions:  Precautions Precautions: Fall, Back Precaution Comments: no brace needed; recalled 3/3 precautions without cue Restrictions Weight Bearing Restrictions: No General:   Vital Signs:   Pain: Pain Assessment Pain Scale:  0-10 Pain Score: 3  Mobility:   Locomotion :    Trunk/Postural Assessment :    Balance:   Exercises:   Other Treatments:      Therapy/Group: Individual Therapy  Ambrose Finland 04/20/2023, 8:31 AM

## 2023-04-20 NOTE — Progress Notes (Signed)
Physical Therapy Session Note  Patient Details  Name: KYSEAN SWEET MRN: [id number] Date of Birth: 1955-08-04  Today's Date: 04/20/2023 PT Individual Time: 1000-1100 PT Individual Time Calculation (min): 60 min   Short Term Goals: Week 1:  PT Short Term Goal 1 (Week 1): Pt will ambulate >100 ft with RW PT Short Term Goal 2 (Week 1): Pt will navigate stairs per home environment with assist PT Short Term Goal 3 (Week 1): Pt will sit OOB for whole day of therapy.  Skilled Therapeutic Interventions/Progress Updates: Pt presented in recliner agreeable to therapy. Pt c/o mild pain, states was significantly more yesterday evening. Pt also noted several blisters have appeared along chest, nsg aware and inspected. Pt ambulated with RW to main gym ~150ft with RW and CGA until end when became more minA due to RLE instability. Pt then participated in toe taps to 6in step with 3lb ankle cuffs added for increased challenge 2 x 10. Pt then transferred to parallel bars and participated in standing therex for BLE strengthening as follows. Performed standing hip abd/add with 3lb weight 2 x 10, hamstring curls 2 x 10, and side stepping with red theraband with red theraband at ankles 58ft x 4. Pt then transferred over to stairs and completed step ups on first step x 5 bilaterally. Pt then participated in ascending/descending x 4 steps using R rail performed with CGA. Pt completed with step to pattern both ascending and descending with min verbal cues for foot placement. Pt propelled back to room at end of session and remained in w/c as next PT session to dovetail current one. Pt left with call bell within reach and needs met.      Therapy Documentation Precautions:  Precautions Precautions: Fall, Back Precaution Comments: no brace needed; recalled 3/3 precautions without cue Restrictions Weight Bearing Restrictions: No General:   Vital Signs: Therapy Vitals Temp: 98.2 F (36.8 C) Temp Source: Oral Pulse  Rate: 72 Resp: 18 BP: 113/75 Patient Position (if appropriate): Sitting Oxygen Therapy SpO2: 95 % O2 Device: Room Air Pain: Pain Assessment Pain Scale: 0-10 Pain Score: 4  Pain Type: Acute pain;Surgical pain Pain Location: Back Pain Orientation: Mid;Upper Pain Descriptors / Indicators: Aching Pain Frequency: Constant Pain Onset: On-going Patients Stated Pain Goal: 2 Pain Intervention(s): Medication (See eMAR) Multiple Pain Sites: No     Therapy/Group: Individual Therapy  Justine Dines 04/20/2023, 4:16 PM

## 2023-04-20 NOTE — Progress Notes (Signed)
PROGRESS NOTE   Subjective/Complaints:   Pt reports pain got out of control yesterday since all the transport and exhausted- feels like could barely hold it together- was tearful when got back due to so much pain- went too long without meds.  Was "rough".  Also has 2 "weird blisters"- on L chest- don't hurt- cannot feel them.  Feeling a lot of brain fog from cancer meds.    ROS: See HPI   Pt denies SOB, abd pain, CP, N/V/C/D, and vision changes   Pain limiting (+) pain his At level T3 SCI pain  Objective:   No results found. Recent Labs    04/18/23 0534  WBC 5.4  HGB 9.1*  HCT 27.8*  PLT 353    Recent Labs    04/18/23 0534  NA 135  K 5.0  CL 100  CO2 28  GLUCOSE 109*  BUN 19  CREATININE 1.05  CALCIUM 8.5*     Intake/Output Summary (Last 24 hours) at 04/20/2023 0839 Last data filed at 04/20/2023 0800 Gross per 24 hour  Intake 1232 ml  Output 1125 ml  Net 107 ml     Pressure Injury 04/19/23 Chest Lateral;Left;Anterior;Upper Stage 1 -  Intact skin with non-blanchable redness of a localized area usually over a bony prominence. fluid filled blister [facility] ([facility])  04/19/23 1800  Location: Chest  Location Orientation: Lateral;Left;Anterior;Upper  Staging: Stage 1 -  Intact skin with non-blanchable redness of a localized area usually over a bony prominence.  Wound Description (Comments): fluid filled blister noted when patient returned from the [facility]  Present on Admission: No    Physical Exam: Vital Signs Blood pressure 114/70, pulse 64, temperature 98 F (36.7 C), temperature source Oral, resp. rate 16, height 5\' 10"  (1.778 m), weight 86.9 kg, SpO2 98%.        General: awake, alert, appropriate, sitting up in bedside chair; NAD HENT: conjugate gaze; oropharynx moist CV: regular rate and rhythm; no JVD Pulmonary: CTA B/L; no W/R/R- good  air movement GI: soft, NT, ND, (+)BS- hypoactive Psychiatric: appropriate- less anxious Skin- 2 small blisters on L chest- don't look like shingles- like a tape blister Neurological: Ox3  No signs of spasticity Has T3 TTP- c/o feeling tight PRIOR EXAMS: Skin- posterior Cervical spine incision- steristrips intact Neuro:  Alert and oriented x 3, follows commands, CN 2-12 intact, no speech or language deficits noted, good insight and awareness RUE: 5/5 Deltoid, 5/5 Biceps, 5/5 Triceps, 5/5 Wrist Ext, 5/5 Grip LUE: 4/5 Deltoid, 5/5 Biceps, 5/5 Triceps, 5/5 Wrist Ext, 5/5 Grip RLE: HF 5/5, KE 5/5, ADF 5/5, APF 5/5 LLE: HF 5/5, KE 5/5,  ADF 5/5, APF 5/5 Sensory exam normal for light touch and pain in all 4 limbs. No limb ataxia or cerebellar signs. No abnormal tone appreciated.  No ankle clonus Musculoskeletal: L shoulder pain and crepitus with PROM, no significant L shoulder tenderness   .  Assessment/Plan: 1. Functional deficits which require 3+ hours per day of interdisciplinary therapy in a comprehensive inpatient rehab setting. Physiatrist is providing close team supervision and 24 hour management of active medical problems listed below. Physiatrist  and rehab team continue to assess barriers to discharge/monitor patient progress toward functional and medical goals  Care Tool:  Bathing    Body parts bathed by patient: Right arm, Left lower leg, Face, Left arm, Chest, Abdomen, Front perineal area, Buttocks, Right upper leg, Left upper leg, Right lower leg         Bathing assist Assist Level: Supervision/Verbal cueing     Upper Body Dressing/Undressing Upper body dressing   What is the patient wearing?: Pull over shirt    Upper body assist Assist Level: Supervision/Verbal cueing    Lower Body Dressing/Undressing Lower body dressing            Lower body assist Assist for lower body dressing: Contact Guard/Touching assist     Toileting Toileting    Toileting assist  Assist for toileting: Supervision/Verbal cueing     Transfers Chair/bed transfer  Transfers assist     Chair/bed transfer assist level: Minimal Assistance - Patient > 75%     Locomotion Ambulation   Ambulation assist      Assist level: Minimal Assistance - Patient > 75% Assistive device: Walker-rolling Max distance: 84 ft   Walk 10 feet activity   Assist     Assist level: Minimal Assistance - Patient > 75% Assistive device: Walker-rolling   Walk 50 feet activity   Assist    Assist level: Minimal Assistance - Patient > 75% Assistive device: Walker-rolling    Walk 150 feet activity   Assist Walk 150 feet activity did not occur: Safety/medical concerns         Walk 10 feet on uneven surface  activity   Assist Walk 10 feet on uneven surfaces activity did not occur: Safety/medical concerns         Wheelchair     Assist Is the patient using a wheelchair?: Yes Type of Wheelchair: Manual    Wheelchair assist level: Supervision/Verbal cueing Max wheelchair distance: 150 ft    Wheelchair 50 feet with 2 turns activity    Assist        Assist Level: Supervision/Verbal cueing   Wheelchair 150 feet activity     Assist      Assist Level: Supervision/Verbal cueing   Blood pressure 114/70, pulse 64, temperature 98 F (36.7 C), temperature source Oral, resp. rate 16, height 5\' 10"  (1.778 m), weight 86.9 kg, SpO2 98%.  Medical Problem List and Plan: 1. 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              -Plan for radiation treatments week of 10/14             -patient may shower             -ELOS/Goals: 10-12, PT/OT Supervision              D/c date 10/26  Cont' CIR PT and OT  D/w SW about radiation and oncology appointments- she will address  - per pt, needs to go home Mod I due to wife not being  able ot help physically and is partially blind right now- new 2.  Antithrombotics: -DVT/anticoagulation:  Mechanical:  Antiembolism stockings, knee (TED hose) Bilateral lower extremities. Awaiting call back from NS regarding initiating pharmacologic DVT prophylaxis-- now on Lovenox 40mg  BID             -antiplatelet therapy: none   3. Pain Management: Tylenol as  needed             -continue Flexeril 10 mg TID prn             -continue Duragesic patch 12/mcg/hr q 72 hours -continue gabapentin 300 mg TID             -continue Lidoderm              -continue oxycodone 10 mg q 4 hrs prn>> d/c'd             -continue ibuprofen 400 mg q 6 hrs prn -10/10- will change Duragesic to 25 mcg q3 days as well as reduce Oxy to 5 mg q4 hours prn- since feels like patch more effective than Oxy.  -10/11- pain slightly better, but Oxy doesn't work- will change Oxy to Dilaudid 2-4 mg q4 hours prn and maintain Duragesic patch 25 mcg- pt happy with change.  -04/17/23 pain intensity worse today but worked hard yesterday; not needing pain meds too much before they're "due" so will leave meds in place for now; defer adjustments to weekday team if needed.  10/14- pain adequate control per pt- will con't current regimen for now 10/15- main pain is at level SCI pain- will increase gabapentin to 400 mg TID_ if tolerates, will go to 600 mg TID- cannot add Duloxetine since on high dose of Lexapro-  10/16- will increase gabapentin tomorrow to 600 mg TID 4. Mood/Behavior/Sleep: LCSW to evaluate and provide emotional support             -History of GAD             -continue Lexapro 30 mg daily             -continue Seroquel 200 mg q HS             -continue trazodone 25 mg q HS             -continue Xanax 0.25 mg BID prn             -continue melatonin 3 mg q HS prn              10/14- sleeping well  10/15- somewhat anxious today since schedule of therapy and radiation/oncology appts overlap- d/w SW_ will look into it-  Oncology takes priority.   10/16- doing better after yesterday 5. Neuropsych/cognition: This patient is capable of making decisions on his own behalf.   6. Skin/Wound Care: Routine skin care checks             -monitor incision>>keep the Steri-Strips dry during showers per Dr. Wynetta Emery   7. Fluids/Electrolytes/Nutrition: Routine Is and Os and follow-up chemistries             -continue Mag-ox 400 mg BID             -continue B12 1000 mcg daily             -Hypokalemia/Hyponatremia- Recheck labs tomorrow -10/10- K+ 5.0- and Na 137- had been hypokalemic- so likely hemolysis- wil recheck Monday 10/15- K+ 5.0- top end of normal 8: Hypertension: controlled, monitor TID and prn   -04/17/23 BPs sort of variable but mostly ok, monitor for now.   10/16- BP doing well -controlled- cont' regimen  Vitals:   04/16/23 1321 04/16/23 1951 04/17/23 0524 04/17/23 1317  BP: 131/75 130/79 (!) 150/92 126/73   04/17/23 1950 04/18/23 0545 04/18/23 1247 04/18/23 1900  BP: 131/81 135/80 129/87 (!) 134/97   04/19/23  0451 04/19/23 0456 04/19/23 2009 04/20/23 0344  BP: (!) 159/89 129/64 136/70 114/70    9: GERD: continue Protonix 40mg  BID   10: Current History of prostate cancer with mets to spine s/p decompressive laminectomy T3 and evacuation of epidural tumor Dr. Wynetta Emery 09/26             -continue Xtandi             -continue Decadron 4mg  q6h             -no brace needed; no twisting, arching, bending             -follow-up with Dr. Wynetta Emery, Dr. Mosetta Putt and Dr. Kathrynn Running   -10/11- starting radiation next week per notes-   10/14- starting radiation this week  10/15- starting today- d/w pharmacy- restarting Zometa and Lupron per Dr Cherly Hensen and pharmacy. 10/16 - started radiation yesterday 11: Anemia: multifactorial             -follow-up CBC   -10/10- Hb 9.9 a little better 12. Constipation, cont miralax BID and senokotS 1 tab BID -10/10- will try Sorbitol 15cc- since doesn't want to blow out- since LBM 2 days ago- and  feels constipated. -10/11- didn't have BM- will order Sorbitol 30cc and pt to take prune juice at lunch- ordered at 4pm.  10/16- LBM 2 days ago- but feels like could go today 13. Heart murmur:  -04/16/23 pt noted to have heart murmur, has been mentioned to him before, doubt need for emergent w/up although might need cardiology f/up for echo/eval.  14. Blisters on chest  10/16- not painful- don't look like shingles- looks like reaction to tape? Keep covered and monitor     LOS: 7 days A FACE TO FACE EVALUATION WAS PERFORMED  Jermon Chalfant 04/20/2023, 8:39 AM

## 2023-04-20 NOTE — Progress Notes (Signed)
Specialty Pharmacy Refill Coordination Note  Nathan Berg is a 67 y.o. male contacted today regarding refills of specialty medication(s) Enzalutamide   Patient requested Courier to Provider Office   Delivery date: 04/21/23   Verified address: [facility]  Patient is currently inpatient at [facility] and is taking home supply of medication as directed by provider. Patient does not have family member to accept delivery to bring to hospital. [facility] Pharmacist Enid Derry will accept courier delivery and ensure delivery to patient.   Medication will be filled on 10/16 if refill request completed in time.

## 2023-04-20 NOTE — Progress Notes (Addendum)
Occupational Therapy Session Note  Patient Details  Name: Nathan Berg MRN: [id number] Date of Birth: 30-Nov-1955  Today's Date: 04/20/2023 OT Individual Time: 1435-1530 OT Individual Time Calculation (min): 55 min    Short Term Goals: Week 1:  OT Short Term Goal 1 (Week 1): STG=LTG d/t ELOS  Skilled Therapeutic Interventions/Progress Updates:     Patient agreeable to participate in OT session. Reports no pain at rest (seated in recliner). Pt reports nerve pain/discomfort just under chest region that wraps around. Pt reports that he takes pain medication to lessen the pain then when it wears off the pain increases.   Patient participated in skilled OT session focusing on UB strengthening and core strengthening. Completed session room d/t airborne and contact precautions.   Strengthening:  - BUE, green theraband, shoulder horizontal abduction/adduction, abduction (R only), IR/er, PNF D1, 10X, 2 sets. VC provided for form and technique. Provided modifications as needed for L shoulder deficits.  - Chest press, circles (right/left), 12X, 1 set completed while holding 2.2lb weighted ball.  - proximal shoulder strengthening: BUE, paddle, criss cross, and circles (right/left), 12X each. No rest breaks.   Pt education provided on breathing technique for exercises that provide increased core stability. Pt able to return demonstration.   Therapy Documentation Precautions:  Precautions Precautions: Fall, Back Precaution Comments: no brace needed Restrictions Weight Bearing Restrictions: No    Therapy/Group: Individual Therapy Limmie Patricia, OTR/L,CBIS  Supplemental OT - [facility] and [facility] Secure Chat Preferred   04/20/2023, 2:54 PM

## 2023-04-20 NOTE — Progress Notes (Signed)
Physical Therapy Session Note  Patient Details  Name: Nathan Berg MRN: [id number] Date of Birth: [birth year]  Today's Date: 04/20/2023 PT Individual Time: 7829-5621 PT Individual Time Calculation (min): 28 min   Short Term Goals: Week 1:  PT Short Term Goal 1 (Week 1): Pt will ambulate >100 ft with RW PT Short Term Goal 2 (Week 1): Pt will navigate stairs per home environment with assist PT Short Term Goal 3 (Week 1): Pt will sit OOB for whole day of therapy.  Skilled Therapeutic Interventions/Progress Updates:    Pt received sitting in w/c and agreeable to therapy session. Donned shoes set-up assist and educated pt on the importance of wearing shoes to protect his feet while mobilizing because pt reports primarily going around barefoot at home.  Transported to/from gym in w/c for time management and energy conservation.  Sit>stand w/c>RW with CGA for safety - keeps knees slightly flexed/soft in standing but not buckling.   Gait training ~83ft using RW with CGA/light min assist for steadying. Pt demonstrating the following gait deviations with therapist providing the described cuing and facilitation for improvement:  - min incoordination in B LEs (L>R) - decreased gait speed  - narrow BOS - reliance on B UE support for stability  Gait training ~4ft using +2 3 Musketeer support for safety to allow challenge without AD, providing +2 min assist for balance. Pt demonstrating the following gait deviations with therapist providing the described cuing and facilitation for improvement:  - with fatigue starts to have slight crouch during gait with knees flexed - has increased incoordination in L LE with excessive adduction and external rotation  - incoordination in B LEs with excessive step length, more prominent when turning  - overall increased balance instability  B UE side stepping using +2 3 Musketeer support providing +2 min A for balance while focusing on strengthening L LE hip  abductors and internal rotators to step back at diagonal - x47ft.  Transported back to room. Stand pivot w/c>recliner using RW with CGA - continued incoordination in B LEs and slow movement. Pt left seated in recliner with needs in reach and chair alarm on.  Therapy Documentation Precautions:  Precautions Precautions: Fall, Back Precaution Comments: no brace needed; recalled 3/3 precautions without cue Restrictions Weight Bearing Restrictions: No   Pain: Reports "a little pain" rated as 3/10 in thoracic spine circumferentially around his trunk. Provided rest breaks and pt premedicated.    Therapy/Group: Individual Therapy  Ginny Forth , PT, DPT, NCS, CSRS 04/20/2023, 8:01 AM

## 2023-04-20 NOTE — Progress Notes (Signed)
Patient ID: Nathan Berg, male   DOB: March 27, 1956, 67 y.o.   MRN: [id number]  SW met with pt in room to provide updates from team conference, and d/c date 10/26. Pt reports his wife now needs eye surgery and will have tomorrow, and requires 24/7 care. He is depressed about his current situation, and now being here in the hospital. SW will check in on patient tomorrow.   Cecile Sheerer, MSW, LCSWA Office: [phone] Cell: [phone] Fax: [phone]

## 2023-04-21 ENCOUNTER — Other Ambulatory Visit: Payer: Self-pay

## 2023-04-21 ENCOUNTER — Ambulatory Visit
Admit: 2023-04-21 | Discharge: 2023-04-21 | Disposition: A | Discharge status: Home / Self Care | Payer: Medicare Other | Attending: Radiation Oncology | Admitting: Radiation Oncology

## 2023-04-21 ENCOUNTER — Ambulatory Visit: Payer: Medicare Other

## 2023-04-21 DIAGNOSIS — Z510 Encounter for antineoplastic radiation therapy: Secondary | ICD-10-CM | POA: Diagnosis not present

## 2023-04-21 DIAGNOSIS — M4714 Other spondylosis with myelopathy, thoracic region: Secondary | ICD-10-CM | POA: Diagnosis not present

## 2023-04-21 DIAGNOSIS — B029 Zoster without complications: Secondary | ICD-10-CM

## 2023-04-21 DIAGNOSIS — C7951 Secondary malignant neoplasm of bone: Secondary | ICD-10-CM

## 2023-04-21 DIAGNOSIS — B028 Zoster with other complications: Secondary | ICD-10-CM

## 2023-04-21 LAB — RAD ONC ARIA SESSION SUMMARY
Course Elapsed Days: 2
Plan Fractions Treated to Date: 2
Plan Prescribed Dose Per Fraction: 8 Gy
Plan Total Fractions Prescribed: 5
Plan Total Prescribed Dose: 40 Gy
Reference Point Dosage Given to Date: 16 Gy
Reference Point Session Dosage Given: 8 Gy
Session Number: 2

## 2023-04-21 MED ORDER — VALACYCLOVIR HCL 500 MG PO TABS
1000.0000 mg | ORAL_TABLET | Freq: Three times a day (TID) | ORAL | Status: DC
  Administered 2023-04-21 – 2023-04-30 (×27): 1000 mg via ORAL
  Filled 2023-04-21 (×29): qty 2

## 2023-04-21 MED ORDER — GABAPENTIN 300 MG PO CAPS
600.0000 mg | ORAL_CAPSULE | Freq: Three times a day (TID) | ORAL | Status: DC
  Administered 2023-04-21 – 2023-04-25 (×12): 600 mg via ORAL
  Filled 2023-04-21 (×12): qty 2

## 2023-04-21 NOTE — Progress Notes (Signed)
PROGRESS NOTE   Subjective/Complaints:   He's had at level SCI pain around entire T3 dermatoma for 2+ weeks Just found blisters Tuesday afternoon that could be shingles or not No pain/tenderness with current blisters- said didn't even know they were there.  Has had shingles vaccine "years ago".   Didn't get schedule- very stressed about radiation today.  "dreading today".  Wife also has emergency surgery for eye/vision today.     ROS: See HPI   Pt denies SOB, abd pain, CP, N/V/C/D, and vision changes Anxiety an issue per pt  Pain limiting (+) pain his At level T3 SCI pain  Objective:   No results found. No results for input(s): "WBC", "HGB", "HCT", "PLT" in the last 72 hours.   No results for input(s): "NA", "K", "CL", "CO2", "GLUCOSE", "BUN", "CREATININE", "CALCIUM" in the last 72 hours.    Intake/Output Summary (Last 24 hours) at 04/21/2023 0857 Last data filed at 04/21/2023 0500 Gross per 24 hour  Intake 480 ml  Output 450 ml  Net 30 ml         Physical Exam: Vital Signs Blood pressure 132/89, pulse 65, temperature 98.7 F (37.1 C), temperature source Oral, resp. rate 18, height 5\' 10"  (1.778 m), weight 86.9 kg, SpO2 100%.        General: awake, alert, appropriate, NAD HENT: conjugate gaze; oropharynx moist CV: regular rate; no JVD Pulmonary: CTA B/L; no W/R/R- good air movement GI: soft, NT, ND, (+)BS Psychiatric: appropriate Neurological: Ox3 Skin- has 3 blisters- at T2/3 area- on L chest- could be dermatomal but don't have appearance of blisters I've seen with shingles-   No signs of spasticity Has T3 TTP- c/o feeling tight PRIOR EXAMS: Skin- posterior Cervical spine incision- steristrips intact Neuro:  Alert and oriented x 3, follows commands, CN 2-12 intact, no speech or language deficits noted, good insight and awareness RUE: 5/5 Deltoid, 5/5 Biceps, 5/5 Triceps, 5/5 Wrist Ext,  5/5 Grip LUE: 4/5 Deltoid, 5/5 Biceps, 5/5 Triceps, 5/5 Wrist Ext, 5/5 Grip RLE: HF 5/5, KE 5/5, ADF 5/5, APF 5/5 LLE: HF 5/5, KE 5/5,  ADF 5/5, APF 5/5 Sensory exam normal for light touch and pain in all 4 limbs. No limb ataxia or cerebellar signs. No abnormal tone appreciated.  No ankle clonus Musculoskeletal: L shoulder pain and crepitus with PROM, no significant L shoulder tenderness   .  Assessment/Plan: 1. Functional deficits which require 3+ hours per day of interdisciplinary therapy in a comprehensive inpatient rehab setting. Physiatrist is providing close team supervision and 24 hour management of active medical problems listed below. Physiatrist and rehab team continue to assess barriers to discharge/monitor patient progress toward functional and medical goals  Care Tool:  Bathing    Body parts bathed by patient: Right arm, Left lower leg, Face, Left arm, Chest, Abdomen, Front perineal area, Buttocks, Right upper leg, Left upper leg, Right lower leg         Bathing assist Assist Level: Supervision/Verbal cueing     Upper Body Dressing/Undressing Upper body dressing   What is the patient wearing?: Pull over shirt    Upper body assist Assist Level: Supervision/Verbal cueing    Lower  Body Dressing/Undressing Lower body dressing            Lower body assist Assist for lower body dressing: Contact Guard/Touching assist     Toileting Toileting    Toileting assist Assist for toileting: Supervision/Verbal cueing     Transfers Chair/bed transfer  Transfers assist     Chair/bed transfer assist level: Minimal Assistance - Patient > 75%     Locomotion Ambulation   Ambulation assist      Assist level: Minimal Assistance - Patient > 75% Assistive device: Walker-rolling Max distance: 84 ft   Walk 10 feet activity   Assist     Assist level: Minimal Assistance - Patient > 75% Assistive device: Walker-rolling   Walk 50 feet activity   Assist     Assist level: Minimal Assistance - Patient > 75% Assistive device: Walker-rolling    Walk 150 feet activity   Assist Walk 150 feet activity did not occur: Safety/medical concerns         Walk 10 feet on uneven surface  activity   Assist Walk 10 feet on uneven surfaces activity did not occur: Safety/medical concerns         Wheelchair     Assist Is the patient using a wheelchair?: Yes Type of Wheelchair: Manual    Wheelchair assist level: Supervision/Verbal cueing Max wheelchair distance: 150 ft    Wheelchair 50 feet with 2 turns activity    Assist        Assist Level: Supervision/Verbal cueing   Wheelchair 150 feet activity     Assist      Assist Level: Supervision/Verbal cueing   Blood pressure 132/89, pulse 65, temperature 98.7 F (37.1 C), temperature source Oral, resp. rate 18, height 5\' 10"  (1.778 m), weight 86.9 kg, SpO2 100%.  Medical Problem List and Plan: 1. 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              -Plan for radiation treatments week of 10/14             -patient may shower             -ELOS/Goals: 10-12, PT/OT Supervision              D/c date 10/26  Con't CIR PT and OT Checked with therapy since said didn't have therapy scheduled- will address  D/w SW about radiation and oncology appointments- she will address  - per pt, needs to go home Mod I due to wife not being able ot help physically and is partially blind right now- wife had emergency surgery today for vision 2.  Antithrombotics: -DVT/anticoagulation:  Mechanical:  Antiembolism stockings, knee (TED hose) Bilateral lower extremities. Awaiting call back from NS regarding initiating pharmacologic DVT prophylaxis-- now on Lovenox 40mg  BID             -antiplatelet therapy: none   3. Pain Management: Tylenol as needed              -continue Flexeril 10 mg TID prn             -continue Duragesic patch 12/mcg/hr q 72 hours -continue gabapentin 300 mg TID             -continue Lidoderm              -continue oxycodone 10 mg q 4 hrs prn>> d/c'd             -  continue ibuprofen 400 mg q 6 hrs prn -10/10- will change Duragesic to 25 mcg q3 days as well as reduce Oxy to 5 mg q4 hours prn- since feels like patch more effective than Oxy.  -10/11- pain slightly better, but Oxy doesn't work- will change Oxy to Dilaudid 2-4 mg q4 hours prn and maintain Duragesic patch 25 mcg- pt happy with change.  -04/17/23 pain intensity worse today but worked hard yesterday; not needing pain meds too much before they're "due" so will leave meds in place for now; defer adjustments to weekday team if needed.  10/14- pain adequate control per pt- will con't current regimen for now 10/15- main pain is at level SCI pain- will increase gabapentin to 400 mg TID_ if tolerates, will go to 600 mg TID- cannot add Duloxetine since on high dose of Lexapro-  10/16- will increase gabapentin tomorrow to 600 mg TID 4. Mood/Behavior/Sleep: LCSW to evaluate and provide emotional support             -History of GAD             -continue Lexapro 30 mg daily             -continue Seroquel 200 mg q HS             -continue trazodone 25 mg q HS             -continue Xanax 0.25 mg BID prn             -continue melatonin 3 mg q HS prn              10/14- sleeping well  10/15- somewhat anxious today since schedule of therapy and radiation/oncology appts overlap- d/w SW_ will look into it- Oncology takes priority.   10/16- doing better after yesterday  10/17- more anxious today- asked him to try Xanax that has Rx for prn 5. Neuropsych/cognition: This patient is capable of making decisions on his own behalf.   6. Skin/Wound Care: Routine skin care checks             -monitor incision>>keep the Steri-Strips dry during showers per Dr. Wynetta Emery   7.  Fluids/Electrolytes/Nutrition: Routine Is and Os and follow-up chemistries             -continue Mag-ox 400 mg BID             -continue B12 1000 mcg daily             -Hypokalemia/Hyponatremia- Recheck labs tomorrow -10/10- K+ 5.0- and Na 137- had been hypokalemic- so likely hemolysis- wil recheck Monday 10/15- K+ 5.0- top end of normal 8: Hypertension: controlled, monitor TID and prn   -04/17/23 BPs sort of variable but mostly ok, monitor for now.   10/16- BP doing well -controlled- cont' regimen  Vitals:   04/17/23 1317 04/17/23 1950 04/18/23 0545 04/18/23 1247  BP: 126/73 131/81 135/80 129/87   04/18/23 1900 04/19/23 0451 04/19/23 0456 04/19/23 2009  BP: (!) 134/97 (!) 159/89 129/64 136/70   04/20/23 0344 04/20/23 1354 04/20/23 2031 04/21/23 0650  BP: 114/70 113/75 (!) 161/85 132/89    9: GERD: continue Protonix 40mg  BID   10: Current History of prostate cancer with mets to spine s/p decompressive laminectomy T3 and evacuation of epidural tumor Dr. Wynetta Emery 09/26             -continue Xtandi             -continue Decadron 4mg   q6h             -no brace needed; no twisting, arching, bending             -follow-up with Dr. Wynetta Emery, Dr. Mosetta Putt and Dr. Kathrynn Running   -10/11- starting radiation next week per notes-   10/14- starting radiation this week  10/15- starting today- d/w pharmacy- restarting Zometa and Lupron per Dr Cherly Hensen and pharmacy. 10/16 - started radiation yesterday 10/17- more radiation today- is concerned won't go well today- asked  if pt wanted to try Xanax for anxiety related to today-  11: Anemia: multifactorial             -follow-up CBC   -10/10- Hb 9.9 a little better 12. Constipation, cont miralax BID and senokotS 1 tab BID -10/10- will try Sorbitol 15cc- since doesn't want to blow out- since LBM 2 days ago- and feels constipated. -10/11- didn't have BM- will order Sorbitol 30cc and pt to take prune juice at lunch- ordered at 4pm.  10/16- LBM 2 days ago- but feels like could  go today 13. Heart murmur:  -04/16/23 pt noted to have heart murmur, has been mentioned to him before, doubt need for emergent w/up although might need cardiology f/up for echo/eval.  14. Blisters on chest  10/16- not painful- don't look like shingles- looks like reaction to tape? Keep covered and monitor  10/17- I'm not sure if has shingles- is on precautions for it- will call ID since Infection prevention not able to decide.   - have paged Dr Daiva Eves-   Dr Daiva Eves said will get viral PCR so we are certain if shingles or not.    I spent a total of  45   minutes on total care today- >50% coordination of care- due to  D/w Dr Daiva Eves-  d/w therapy and nursing about schedule- and also nursing about Shingles likely dx-     LOS: 8 days A FACE TO FACE EVALUATION WAS PERFORMED  Sharad Vaneaton 04/21/2023, 8:57 AM

## 2023-04-21 NOTE — Progress Notes (Signed)
Physical Therapy Weekly Progress Note  Patient Details  Name: Nathan Berg MRN: [id number] Date of Birth: Jan 05, 1956  Beginning of progress report period: April 14, 2023 End of progress report period: April 21, 2023  Today's Date: 04/21/2023 PT Individual Time: 1020-1105 PT Individual Time Calculation (min): 45 min   Patient has met 3 of 3 short term goals.  Pt is progressing steadily toward LTGs. He is able to stand with and without RW with CGA, ambulates >150 ft with RW and CGA-min A. No family education planned as pt is competent to direct his own care.   Patient continues to demonstrate the following deficits muscle weakness and muscle joint tightness, impaired timing and sequencing and decreased coordination, and decreased standing balance, decreased balance strategies, and difficulty maintaining precautions and therefore will continue to benefit from skilled PT intervention to increase functional independence with mobility.  Patient progressing toward long term goals..  Continue plan of care.  PT Short Term Goals Week 1:  PT Short Term Goal 1 (Week 1): Pt will ambulate >100 ft with RW PT Short Term Goal 1 - Progress (Week 1): Met PT Short Term Goal 2 (Week 1): Pt will navigate stairs per home environment with assist PT Short Term Goal 2 - Progress (Week 1): Met PT Short Term Goal 3 (Week 1): Pt will sit OOB for whole day of therapy. PT Short Term Goal 3 - Progress (Week 1): Met Week 2:  PT Short Term Goal 1 (Week 2): =LTGs d/t ELOS  Skilled Therapeutic Interventions/Progress Updates:    Pt received in recliner and agreeable to therapy.  Pt reports up to 7/10 around T3 dermatome, nsg provided medication during session.  Session focused on balance training with dynamic stepping with decr UE support. Pt performed stepping with RW progressing to unsteady HHA. Progressed to s.p with VUE HHA>UUE HHA, no UE support with min A. Pt did require up to mod A with assist to reach chair  d/t LOB. Progressed to CGA overall, cues for technique and maintaining back precautions when reaching back for chair.  Pt also performed side stepping with HHA,  min- mod A for balance.   Discussed goals as written above and pt is in agreement, feels he is making good progress. Pt remained in chair at end of session and was left with all needs in reach and alarm active.   Therapy Documentation Precautions:  Precautions Precautions: Fall, Back Precaution Comments: no brace needed; recalled 3/3 precautions without cue Restrictions Weight Bearing Restrictions: No General:      Therapy/Group: Individual Therapy  Juluis Rainier 04/21/2023, 10:41 AM

## 2023-04-21 NOTE — Progress Notes (Signed)
Notified patient on air bourne contact, door hanger up and order in place.  Contact cart outside of room.  Reviewed pictures of area to left upper chest by radiation marker.  Not typical area for shingles.  Contacted IP and said would have to defer to MD as can't make diagnosis. Deferred to MD to review area for proper treatment.

## 2023-04-21 NOTE — Consult Note (Signed)
Date of Admission:  04/13/2023          Reason for Consult: Likely Zoster in immunocompromised patient    Referring Provider: Genice Rouge, MD   Assessment:  Zoster with likley delay in rash due to his immunocompromise from chemotherapy and now also decadron Prostate cancer with metastases to spine sp Neurosurgery HTN    Plan:  Vesicle unroofed and sent for VZV PCR Switch po acyclovir to high dose valtrex Airborne/Contact precautions until lesions have scabbed over unless we can convince ourselves it is not VZV which I am not convinced we can do given he did get some antivirals before I sent swab.  The PCR should come back to my Epic inbox but for now I will start valtrex and we can see how he does. Please call us back when he appears to be resolving these lesions.  Principal Problem:   Zoster Active Problems:   Prostate cancer (HCC)   Compression fracture of T3 vertebra (HCC)   Anemia   Primary hypertension   Spinal stenosis, thoracic   Adjustment disorder   Scheduled Meds:  vitamin B-12  1,000 mcg Oral Daily   dexamethasone  4 mg Oral Q6H   enoxaparin (LOVENOX) injection  40 mg Subcutaneous Q12H   enzalutamide  160 mg Oral Daily   escitalopram  30 mg Oral Daily   fentaNYL  1 patch Transdermal Q72H   gabapentin  600 mg Oral TID   lidocaine  1 patch Transdermal Q24H   magnesium oxide  400 mg Oral BID   pantoprazole  40 mg Oral BID AC   polyethylene glycol  17 g Oral BID   QUEtiapine  200 mg Oral QHS   senna-docusate  1 tablet Oral BID   sorbitol  30 mL Oral Once   traZODone  25 mg Oral QHS   valACYclovir  1,000 mg Oral TID   Continuous Infusions: PRN Meds:.acetaminophen, ALPRAZolam, alum & mag hydroxide-simeth, cyclobenzaprine, guaiFENesin-dextromethorphan, HYDROmorphone, ibuprofen, melatonin, ondansetron **OR** ondansetron (ZOFRAN) IV  HPI: Nathan Berg is a 67 y.o. male with metastatic prostate cancer following with both Oncology and having most  recently been on Xtandi and Radiation ONcology who was admitted with weakness, fatigue, lethargy and pain I in his chest in bandline sensation  He described as "like rubber band".  He also sawDr Darrelyn Hillock who  ordered an MRI.  He was called by  and report that he has tumor-infiltrating to his spine around T3. In addition to recent worsening of his low back discomfort, the patient has noted some mild weakness involving the bilateral lower extremities over the course of the last week, left greater than right. On 9/26 he underwent decompressive laminectomy by Dr. Wynetta Emery. Remains on Decadron   He was transferred to Rehab and while there has developed a vesicular rash in his thoracic dermatome concerning for VZV>  I was told about the patient yesterday and said he should be in airborne contact precautions  Oral acyclovir was started. I was asked Dr. Berline Chough to see the patient due to unusual presentation.  I indeed think he could very well have zoster in T dermatome. His immunosuppressive therapy may have delayed the eruption of the rash. If he did not have T spine pathology from metastatic disease one wonders if the VZV could have been causing any of those symptoms. This would seem an unusual coincidence.  Regardless he can present in unorthodox way with immunocompromised status  I have unroofed a  vesicle and sent for VZV PCR.  I have personally spent 84 minutes involved in face-to-face and non-face-to-face activities for this patient on the day of the visit. Professional time spent includes the following activities: Preparing to see the patient (review of tests), Obtaining and/or reviewing separately obtained history (admission/discharge record), Performing a medically appropriate examination and/or evaluation , Ordering medications/tests/procedures, referring and communicating with other health care professionals, Documenting clinical information in the EMR, Independently interpreting results (not separately  reported), Communicating results to the patient/family/caregiver, Counseling and educating the patient/family/caregiver and Care coordination (not separately reported).    Review of Systems: Review of Systems  Constitutional:  Negative for chills, fever, malaise/fatigue and weight loss.  HENT:  Negative for congestion and sore throat.   Eyes:  Negative for blurred vision and photophobia.  Respiratory:  Negative for cough, shortness of breath and wheezing.   Cardiovascular:  Positive for chest pain. Negative for palpitations and leg swelling.  Gastrointestinal:  Negative for abdominal pain, blood in stool, constipation, diarrhea, heartburn, melena, nausea and vomiting.  Genitourinary:  Negative for dysuria, flank pain and hematuria.  Musculoskeletal:  Negative for back pain, falls, joint pain and myalgias.  Skin:  Negative for itching and rash.  Neurological:  Negative for dizziness, focal weakness, loss of consciousness, weakness and headaches.  Endo/Heme/Allergies:  Does not bruise/bleed easily.  Psychiatric/Behavioral:  Negative for depression and suicidal ideas. The patient does not have insomnia.     Past Medical History:  Diagnosis Date   Anxiety    Arthritis    Cancer (HCC)    Prostate cancer   Depression    GERD (gastroesophageal reflux disease)    H/O seasonal allergies    History of kidney stones    x1   Prostate cancer (HCC)     Social History   Tobacco Use   Smoking status: Never   Smokeless tobacco: Never  Vaping Use   Vaping status: Never Used  Substance Use Topics   Alcohol use: Yes    Comment: 4 beers per week   Drug use: No    Family History  Problem Relation Age of Onset   Prostate cancer Father    Breast cancer Neg Hx    Colon cancer Neg Hx    Pancreatic cancer Neg Hx    Allergies  Allergen Reactions   Codeine Nausea And Vomiting   Penicillin G Other (See Comments)    Stomach upset    OBJECTIVE: Blood pressure 132/89, pulse 65,  temperature 98.7 F (37.1 C), temperature source Oral, resp. rate 18, height 5\' 10"  (1.778 m), weight 86.9 kg, SpO2 100%.  Physical Exam Constitutional:      Appearance: He is well-developed.  HENT:     Head: Normocephalic and atraumatic.  Eyes:     Conjunctiva/sclera: Conjunctivae normal.  Cardiovascular:     Rate and Rhythm: Normal rate and regular rhythm.  Pulmonary:     Effort: Pulmonary effort is normal. No respiratory distress.     Breath sounds: No wheezing.  Abdominal:     General: There is no distension.     Palpations: Abdomen is soft.  Musculoskeletal:        General: No tenderness. Normal range of motion.     Cervical back: Normal range of motion and neck supple.  Skin:    General: Skin is warm and dry.     Coloration: Skin is not pale.     Findings: No erythema or rash.  Neurological:     General:  No focal deficit present.     Mental Status: He is alert and oriented to person, place, and time.  Psychiatric:        Mood and Affect: Mood normal.        Behavior: Behavior normal.        Thought Content: Thought content normal.        Judgment: Judgment normal.    Rash 04/21/2023:    Lab Results Lab Results  Component Value Date   WBC 5.4 04/18/2023   HGB 9.1 (L) 04/18/2023   HCT 27.8 (L) 04/18/2023   MCV 96.2 04/18/2023   PLT 353 04/18/2023    Lab Results  Component Value Date   CREATININE 1.05 04/18/2023   BUN 19 04/18/2023   NA 135 04/18/2023   K 5.0 04/18/2023   CL 100 04/18/2023   CO2 28 04/18/2023    Lab Results  Component Value Date   ALT 20 04/17/2023   AST 17 04/17/2023   ALKPHOS 77 04/17/2023   BILITOT 0.6 04/17/2023     Microbiology: No results found for this or any previous visit (from the past 240 hour(s)).  Acey Lav, MD [facility] [phone] pager  04/21/2023, 11:01 AM

## 2023-04-21 NOTE — Progress Notes (Signed)
Occupational Therapy Session Note  Patient Details  Name: Nathan Berg MRN: [id number] Date of Birth: 21-May-1956  Today's Date: 04/21/2023 OT Individual Time: 4098-1191 OT Individual Time Calculation (min): 30 min    Short Term Goals: Week 1:  OT Short Term Goal 1 (Week 1): STG=LTG d/t ELOS    Skilled Therapeutic Interventions/Progress Updates:    Pt on airborne/ contact precautions. Upon entering room, pt having difficulty getting a good fit with his duck bill mask, RN brought 2 other masks to try and pt found one that worked.  Pt declined bathing today, he did not want to change clothing.  Pt agreeable to engaging in mobility in the room.  The ID MD arrived to take skin samples for testing. Pt was then able to complete sit to stand from recliner with RW with supervision, ambulated to bathroom with supervision (declined need to toilet),  ambulated to sink as he wanted to shave. Pt able to tolerate standing at sink for over 10 minutes to engage in grooming tasks with supervision only.  His back began to hurt and his legs felt tired at this point. Pt sat in w/c and then he decided to move back to recliner.  Pt with all needs met, alarm set.    Therapy Documentation Precautions:  Precautions Precautions: Fall, Back Precaution Comments: no brace needed; recalled 3/3 precautions without cue Restrictions Weight Bearing Restrictions: No General:   Vital Signs: Therapy Vitals Temp: 98.7 F (37.1 C) Temp Source: Oral Pulse Rate: 65 Resp: 18 BP: 132/89 Patient Position (if appropriate): Sitting Oxygen Therapy SpO2: 100 % O2 Device: Room Air Patient Activity (if Appropriate): In chair Pain: Pain Assessment Pain Scale: 0-10 Pain Score: 8  Pain Type: Surgical pain;Acute pain Pain Location: Back Pain Orientation: Mid;Lower Pain Radiating Towards:  (sternum soreness) Pain Descriptors / Indicators: Aching;Constant;Discomfort;Radiating;Sharp Pain Frequency: Constant Pain Onset:  On-going Patients Stated Pain Goal: 2 Pain Intervention(s): Medication (See eMAR)    Therapy/Group: Individual Therapy  Ilya Ess 04/21/2023, 10:01 AM

## 2023-04-21 NOTE — Progress Notes (Signed)
Physical Therapy Session Note  Patient Details  Name: Nathan Berg MRN: [id number] Date of Birth: Nov 11, 1955  Today's Date: 04/21/2023 PT Individual Time: 0820-0900 PT Individual Time Calculation (min): 40 min   Short Term Goals: Week 1:  PT Short Term Goal 1 (Week 1): Pt will ambulate >100 ft with RW PT Short Term Goal 1 - Progress (Week 1): Met PT Short Term Goal 2 (Week 1): Pt will navigate stairs per home environment with assist PT Short Term Goal 2 - Progress (Week 1): Met PT Short Term Goal 3 (Week 1): Pt will sit OOB for whole day of therapy. PT Short Term Goal 3 - Progress (Week 1): Met  Skilled Therapeutic Interventions/Progress Updates: Pt presented in recliner agreeable to therapy. Pt states pain well controlled this am and was able to sleep in sidelying for portions of the night. Session focused on BLE strengthening within context of room as pt currently on isolation for shingles.   Ankle pumps LAQ 4lb weight  Standing hip abd/add 4lb weight 3x10 Standing hamstring curls 4lb weight 2 x 10 Standing hip extension 4lb weight 2 x 10 Toe taps to 4in block with 4lb weights 2 x 10  Pt with no knee instability with standing activities however noted significant posterior chain ms weakness with OKC activities. Pt left in recliner at end of session with seat alarm on, call bell within reach and needs met.         Therapy Documentation Precautions:  Precautions Precautions: Fall, Back Precaution Comments: no brace needed; recalled 3/3 precautions without cue Restrictions Weight Bearing Restrictions: No General:   Vital Signs: Therapy Vitals Temp: 98.8 F (37.1 C) Temp Source: Oral Pulse Rate: 72 Resp: 16 BP: 115/81 Patient Position (if appropriate): Sitting Pain: Pain Assessment Pain Scale: 0-10 Pain Score: 7  Pain Type: Acute pain Pain Location: Back Pain Orientation: Mid Pain Radiating Towards: chest;lower ribs Pain Descriptors / Indicators:  Aching;Constant Pain Frequency: Constant Pain Onset: On-going Pain Intervention(s): Medication (See eMAR)    Therapy/Group: Individual Therapy  [name] 04/21/2023, 4:07 PM

## 2023-04-21 NOTE — Progress Notes (Signed)
Occupational Therapy Weekly Progress Note  Patient Details  Name: Nathan Berg MRN: [id number] Date of Birth: 1955-09-01  Beginning of progress report period: April 14, 2023 End of progress report period: April 21, 2023  Today's Date: 04/21/2023 OT Individual Time: 2130-8657 OT Individual Time Calculation (min): 57 min    Patient has been progressing toward all goals. Limited to in room treatment due to d/x of r/o shingles with OT and pt donning masks and OT in full necessary PPE. This session pt participated in full shower, self care, mobility and standing balance activity for reassessment and treatment progression. Approaching met for UB ST/LTG's of mod I with set up only for grooming, shower level UB bathing and pull on shirt dressing seated level. Pt requires close S for figure 4 LB bathing and dressing and UE support for balance for garment management. Rests required between activity.   Patient continues to demonstrate the following deficits: muscle weakness and muscle paralysis, decreased cardiorespiratoy endurance, impaired timing and sequencing, unbalanced muscle activation, ataxia, decreased coordination, and decreased motor planning, and decreased sitting balance, decreased standing balance, decreased postural control, decreased balance strategies, and difficulty maintaining precautions and therefore will continue to benefit from skilled OT intervention to enhance overall performance with BADL, iADL, and Reduce care partner burden.  Patient progressing toward long term goals..  Continue plan of care.  OT Short Term Goals Week 2:  OT Short Term Goal 1 (Week 2): STG=LTG d/t LOS  Skilled Therapeutic Interventions/Progress Updates:   See above for detailed treatment. Pt amb with RW with close S from recliner to and from shower stall to TTB ~ 20 ft x 2 sets. Stood up to intervals of 2 min with RW support during self care routine. Pt left in recliner with all needs, nurse call button  and chair alarm set.    Pain:  7/10 with meds called for and shower completed  Therapy Documentation Precautions:  Precautions Precautions: Fall, Back Precaution Comments: no brace needed; recalled 3/3 precautions without cue Restrictions Weight Bearing Restrictions: No  Therapy/Group: Individual Therapy  Vicenta Dunning 04/21/2023, 7:52 AM

## 2023-04-22 ENCOUNTER — Ambulatory Visit: Payer: Medicare Other

## 2023-04-22 DIAGNOSIS — M4714 Other spondylosis with myelopathy, thoracic region: Secondary | ICD-10-CM | POA: Diagnosis not present

## 2023-04-22 MED ORDER — IBUPROFEN 400 MG PO TABS
400.0000 mg | ORAL_TABLET | Freq: Four times a day (QID) | ORAL | Status: DC | PRN
  Administered 2023-04-22 – 2023-04-30 (×16): 400 mg via ORAL
  Filled 2023-04-22 (×17): qty 1

## 2023-04-22 NOTE — Progress Notes (Signed)
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    9/26. Underwent Procedure: Decompressive laminectomy T3 extending into the inferior aspect of the 2 3 disc base to below the 3 4 disc base at the level of T4 pedicle. With partial facetectomy and evacuation of epidural tumor on 03/31/23. Admitted to rehab yesterday. Planning to start radiation next week.   Current plan is to discharge about 10/26.  We will follow-up with outpatient PSMA PET for restaging after that day.  Depending on disease burden, discussed if there is high disease burden, then I recommend proceed with docetaxel chemotherapy.  He is concerning about chemotherapy toxicity.  We discussed given his good performance status, he likely will tolerate well.  Although sometimes unexpected toxicity can occur with chemotherapy we can also consider alternate schedule and dosing.  We can discuss this further as outpatient after he is restaging imaging.   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  We still have chemotherapy as he has good functional status for example docetaxel, cabazitaxel, radiation therapy with radium 223, and Pluvicto. His alkaline phosphatase is not significantly elevated. Will see what the PET scan shows Recommend outpatient PSMA PET scan upon discharge.  Will connect with our navigator Genetic testing pending as outpatient Continue enzalutamide for now   Upper back pain with band like sensation/known T3 metastasis S/p decompressive surgery Radiation plan per radiation oncology starting next week. Received Zometa   B12 deficiency Continue b12   Zoster infection Management per  primary team and ID.  Appreciate rehab and therapist assistance.  Thank you for the consult.  Please reach out to me if any questions.  Melven Sartorius, MD 04/22/2023 2:05 PM  Subjective:  [name] was working with physical therapist when I enter.  Report of he has improvement of weakness.  He was found to have shingles and on antiviral.  He said rash started about Tuesday.  Objective:  Vitals:   04/21/23 2042 04/22/23 0549  BP: 135/69 134/83  Pulse: 71 67  Resp: 17 18  Temp: 98.9 F (37.2 C) 98.1 F (36.7 C)  SpO2: 98% 97%     Intake/Output Summary (Last 24 hours) at 04/22/2023 1405 Last data filed at 04/22/2023 1201 Gross per 24 hour  Intake 840 ml  Output 1525 ml  Net -685 ml    GENERAL: alert, no distress and comfortable SKIN: skin color normal   Labs:  Recent Labs    03/28/23 1800 03/30/23 0724 04/08/23 0547 04/14/23 0631 04/17/23 1124 04/18/23 0534  NA 136   < > 135 137  --  135  K 3.8   < > 4.1 5.0  --  5.0  CL 100   < > 102 101  --  100  CO2 28   < > 26 28  --  28  GLUCOSE 105*   < > 95 110*  --  109*  BUN 15   < > 24* 19  --  19  CREATININE 0.83   < > 0.81 0.83  --  1.05  CALCIUM 8.9   < > 8.3* 8.8*  --  8.5*  GFRNONAA >60   < > >60 >60  --  >  60  PROT 7.2  --   --  5.4* 6.0*  --   ALBUMIN 4.0  --   --  2.6* 2.8*  --   AST 27  --   --  19 17  --   ALT 32  --   --  18 20  --   ALKPHOS 99  --   --  78 77  --   BILITOT 0.6  --   --  0.4 0.6  --   BILIDIR  --   --   --   --  <0.1  --   IBILI  --   --   --   --  NOT CALCULATED  --    < > = values in this interval not displayed.    Studies:  MR THORACIC SPINE W WO CONTRAST  Result Date: 04/11/2023 CLINICAL DATA:  Initial evaluation for metastatic disease evaluation. SRS protocol. EXAM: MRI THORACIC WITHOUT AND WITH CONTRAST TECHNIQUE: Multiplanar and multiecho pulse sequences of the thoracic spine were obtained without and with intravenous contrast. CONTRAST:  10mL GADAVIST GADOBUTROL 1 MMOL/ML IV SOLN  COMPARISON:  Prior study from 03/30/2023. FINDINGS: Alignment:  A limited SRS protocol MRI was performed. Vertebral bodies normally aligned with preservation of the normal thoracic kyphosis. No listhesis. Vertebrae: Osseous metastases involving the T3 and T4 vertebral bodies are seen. There is complete replacement of the T3 and T4 vertebral bodies themselves, with involvement of the left posterior elements. Associated pathologic compression fracture of T3 with severe height loss and vertebral plana formation and 5 mm bony retropulsion. More mild compression deformity of T4 with up to 20% height loss without retropulsion. Epidural extension of tumor at T2 through T4, most pronounced within the ventral epidural space. Patient is status post posterior decompression at T2-3 and T3-4. Residual severe stenosis at the level of T3 due to bony retropulsion and epidural tumor. Thecal sac measures 6 mm in AP diameter at its most narrow point (series 5, image 16). More mild stenosis noted at the levels of T2 and T4. Examination is to be utilized for treatment planning purposes. Additional osseous metastases are seen involving the T2 vertebral body without associated pathologic fracture or extraosseous extension. Additional osseous metastasis noted involving the C6 vertebral body on counter sequence (series 2, image 9). Few additional scattered small osseous metastases noted distally within the thoracic spine involving the T5, T8, and T10 vertebral bodies. No other pathologic compression fracture. No other epidural or extraosseous extension of tumor. Cord: Focal kinking of the upper thoracic cord at the level of T3 due to compression fracture and stenosis at this level (series 3, image 12). No visible cord signal changes. Paraspinal and other soft tissues: Postoperative changes noted within the upper thoracic posterior paraspinous soft tissues. No worrisome collections. 1 cm sebaceous cyst noted within the subcutaneous fat of  the upper thoracic spine. Layering left pleural effusion partially visualized. Disc levels: Underlying mild for age spondylosis and multilevel facet arthrosis. No other significant spinal stenosis. Moderate left foraminal narrowing at T10-11. IMPRESSION: 1. Osseous metastases involving the T3 and T4 vertebral bodies. Associated pathologic compression fractures as above, severe at the T3 level. Epidural extension of tumor at T2 through T4 with sequelae of prior posterior decompression at T2-3 and T3-4. Residual severe stenosis at the level of T3 due to bony retropulsion and epidural tumor. More mild stenosis at the levels of T2 and T4. Examination is to be utilized for treatment planning purposes. 2. Additional scattered osseous  metastases involving the C6, T2, T5, T8, and T10 vertebral bodies. No other pathologic fracture or extraosseous extension of tumor. 3. Layering left pleural effusion. Electronically Signed   By: Rise Mu M.D.   On: 04/11/2023 21:58   DG Thoracic Spine 1 View  Result Date: 03/31/2023 CLINICAL DATA:  Thoracic laminectomy for tumor. EXAM: OPERATIVE THORACIC SPINE 1 VIEW(S) COMPARISON:  03/30/2023. FINDINGS: A single fluoroscopic image was obtained intraoperatively for thoracic laminectomy. Total fluoroscopy time is 2.9 seconds. Dose: 0.5 mGy. Please see operative report for additional information. IMPRESSION: Intraoperative utilization of fluoroscopy. Electronically Signed   By: Thornell Sartorius M.D.   On: 03/31/2023 20:01   DG C-Arm 1-60 Min-No Report  Result Date: 03/31/2023 Fluoroscopy was utilized by the requesting physician.  No radiographic interpretation.   MR OUTSIDE FILMS SPINE  Result Date: 03/30/2023 This examination belongs to an outside facility and is stored here for comparison purposes only.  Contact the originating outside institution for any associated report or interpretation.  CT THORACIC SPINE WO CONTRAST  Result Date: 03/30/2023 CLINICAL DATA:   Thoracic compression fracture.  Known malignancy. EXAM: CT THORACIC SPINE WITHOUT CONTRAST TECHNIQUE: Multidetector CT images of the thoracic were obtained using the standard protocol without intravenous contrast. RADIATION DOSE REDUCTION: This exam was performed according to the departmental dose-optimization program which includes automated exposure control, adjustment of the mA and/or kV according to patient size and/or use of iterative reconstruction technique. COMPARISON:  Thoracic spine MRI 1 day prior FINDINGS: Alignment: Normal. Vertebrae: Again seen is marked compression deformity of the T3 vertebral body with near-complete loss of vertebral body height and 4 mm bony retropulsion consistent with pathologic fracture in the setting of osseous metastatic disease. There is also a nondisplaced fracture through the right T3 lamina and transverse process extending to the inferior articulating process (5-23, 7-37). Also again seen is the more mild pathologic compression fracture of the T4 vertebral body with approximately 40% loss of vertebral body height anteriorly and no bony retropulsion. There is a nondisplaced fracture of the right T4 transverse process (5-31, 7-32). The other vertebral body heights are preserved. The bones are diffusely demineralized. Known additional metastatic lesions as well as epidural tumor at T3 and T4 are better seen on the thoracic spine MRI from 1 day prior. Paraspinal and other soft tissues: Nodular scarring in the medial right lung apex is unchanged. A probable sebaceous cyst is noted in the subcutaneous fat just to the left of midline at the T1 level. A large hiatal hernia is unchanged. Disc levels: There is overall mild underlying degenerative change throughout the thoracic spine. Known severe spinal canal stenosis at T3 is better seen on the MRI. IMPRESSION: 1. Pathologic compression fractures of the T3 and T4 vertebral bodies are similar to the prior MRI, worse at T3 where  there is essentially complete loss of vertebral body height and bony retropulsion. Known severe spinal canal stenosis due to the retropulsion and epidural tumor is better seen on the prior MRI. 2. Additional nondisplaced fractures of the right T3 lamina, transverse process, and inferior articulating process, and right T4 transverse process. Electronically Signed   By: Lesia Hausen M.D.   On: 03/30/2023 13:50   MR THORACIC SPINE W WO CONTRAST  Result Date: 03/30/2023 CLINICAL DATA:  Initial evaluation for musculoskeletal neoplasm, history of prostate cancer. EXAM: MRI THORACIC WITHOUT AND WITH CONTRAST TECHNIQUE: Multiplanar and multiecho pulse sequences of the thoracic spine were obtained without and with intravenous contrast. CONTRAST:  9mL GADAVIST  GADOBUTROL 1 MMOL/ML IV SOLN COMPARISON:  Prior study from 12/09/2022. FINDINGS: Alignment: Vertebral bodies normally aligned with preservation of the normal thoracic kyphosis. Trace degenerative retrolisthesis of L1 on L2 noted. Vertebrae: Osseous metastasis involving the T3 vertebral body is seen. Associated pathologic compression fracture with vertebral plana formation and up to 6 mm of bony retropulsion. Additional osseous metastasis involves the entirety of the T4 vertebral body. Associated 40% height loss without significant bony retropulsion. Tumor extends to involve the posterior elements bilaterally. Epidural extension of tumor at these levels with tumor within the ventral epidural space, most pronounced at T3 where there is resultant severe spinal stenosis and cord compression (series 10, image 17). Secondary cord flattening with suspected early/mild cord signal changes (series 4, image 13). Thecal sac measures approximately 4-5 mm in AP diameter. No more than mild stenosis inferiorly at the level of T4. Moderate to severe bilateral foraminal narrowing noted at T2-3 and T3-4 as well. Multiple additional smaller osseous metastases seen scattered elsewhere  throughout the thoracic spine. For reference purposes, the largest additional lesion involves the inferior aspect of T2 and measures 1.5 cm (series 5, image 12). No other significant extra osseous or epidural extension of tumor. No other pathologic compression fracture. Few additional scattered osseous metastases noted within the cervical spine on counter sequence. No visible extra osseous or epidural extension of tumor within the cervical spine itself. Cord: Cord impingement with suspected subtle early cord signal changes at the level of T3 as above. Otherwise normal signal and morphology. Paraspinal and other soft tissues: Paraspinous soft tissues demonstrate no acute finding. Small layering left pleural effusion noted. Disc levels: Underlying mild for age multilevel thoracic spondylosis elsewhere throughout the thoracic spine. Scattered superimposed multilevel facet disease. No other significant spinal stenosis. Mild multilevel foraminal narrowing noted within the lower thoracic spine at T10-11 bilaterally and T12-L1 on the left. IMPRESSION: 1. Osseous metastases involving the T3 and T4 vertebral bodies with associated pathologic compression fractures as above. Epidural extension of tumor at these levels with resultant severe spinal stenosis and cord compression at the level of T3. Probable subtle early/mild cord signal changes at this level. 2. Multiple additional smaller osseous metastases scattered elsewhere throughout the thoracic spine. No other significant extra osseous or epidural extension of tumor. 3. Underlying mild for age thoracic spondylosis without other high-grade spinal stenosis. Mild multilevel foraminal narrowing within the lower thoracic spine as above. 4. Small layering left pleural effusion. Electronically Signed   By: Rise Mu M.D.   On: 03/30/2023 04:25

## 2023-04-22 NOTE — Plan of Care (Signed)
[facility] Tonsillectomy/Adenoidectomy, Postoperative PEDS care plan entered in error.

## 2023-04-22 NOTE — Progress Notes (Signed)
PROGRESS NOTE   Subjective/Complaints:   Pt reports rough day yesterday going for radiation. Transfer went better, but still a lot of pain at end of day.  Wife's surgery went well  Burning now under R arm- not sure why0 if due to at level SCI pain or Shingles.  ID took dressing off per pt.   Unhappy didn't have pudding for breakfast.    ROS: See HPI   Pt denies SOB, abd pain, CP, N/V/C/D, and vision changes   Pain limiting (+) pain his At level T3 SCI pain  Objective:   No results found. No results for input(s): "WBC", "HGB", "HCT", "PLT" in the last 72 hours.   No results for input(s): "NA", "K", "CL", "CO2", "GLUCOSE", "BUN", "CREATININE", "CALCIUM" in the last 72 hours.    Intake/Output Summary (Last 24 hours) at 04/22/2023 1114 Last data filed at 04/22/2023 0840 Gross per 24 hour  Intake 720 ml  Output 1300 ml  Net -580 ml         Physical Exam: Vital Signs Blood pressure 134/83, pulse 67, temperature 98.1 F (36.7 C), temperature source Oral, resp. rate 18, height 5\' 10"  (1.778 m), weight 86.9 kg, SpO2 97%.         General: awake, alert, appropriate, NAD HENT: conjugate gaze; oropharynx moist CV: regular rate; no JVD Pulmonary: CTA B/L; no W/R/R- good air movement GI: soft, NT, ND, (+)BS Psychiatric: appropriate Neurological: Ox3  Skin- has 3 blisters- at T2/3 area- on L  chest- blisters popped and a few tiny blisters/ecchymoses- in same area- erythematous- but scabbing over nicely.   No signs of spasticity Has T3 TTP- c/o feeling tight PRIOR EXAMS: Skin- posterior Cervical spine incision- steristrips intact Neuro:  Alert and oriented x 3, follows commands, CN 2-12 intact, no speech or language deficits noted, good insight and awareness RUE: 5/5 Deltoid, 5/5 Biceps, 5/5 Triceps, 5/5 Wrist Ext, 5/5 Grip LUE: 4/5 Deltoid, 5/5 Biceps, 5/5 Triceps, 5/5 Wrist Ext, 5/5 Grip RLE: HF 5/5, KE  5/5, ADF 5/5, APF 5/5 LLE: HF 5/5, KE 5/5,  ADF 5/5, APF 5/5 Sensory exam normal for light touch and pain in all 4 limbs. No limb ataxia or cerebellar signs. No abnormal tone appreciated.  No ankle clonus Musculoskeletal: L shoulder pain and crepitus with PROM, no significant L shoulder tenderness   .  Assessment/Plan: 1. Functional deficits which require 3+ hours per day of interdisciplinary therapy in a comprehensive inpatient rehab setting. Physiatrist is providing close team supervision and 24 hour management of active medical problems listed below. Physiatrist and rehab team continue to assess barriers to discharge/monitor patient progress toward functional and medical goals  Care Tool:  Bathing    Body parts bathed by patient: Right arm, Left lower leg, Face, Left arm, Chest, Abdomen, Front perineal area, Buttocks, Right upper leg, Left upper leg, Right lower leg         Bathing assist Assist Level: Supervision/Verbal cueing     Upper Body Dressing/Undressing Upper body dressing   What is the patient wearing?: Pull over shirt    Upper body assist Assist Level: Supervision/Verbal cueing    Lower Body Dressing/Undressing Lower body dressing  Lower body assist Assist for lower body dressing: Contact Guard/Touching assist     Toileting Toileting    Toileting assist Assist for toileting: Supervision/Verbal cueing     Transfers Chair/bed transfer  Transfers assist     Chair/bed transfer assist level: Minimal Assistance - Patient > 75%     Locomotion Ambulation   Ambulation assist      Assist level: Minimal Assistance - Patient > 75% Assistive device: Walker-rolling Max distance: 84 ft   Walk 10 feet activity   Assist     Assist level: Minimal Assistance - Patient > 75% Assistive device: Walker-rolling   Walk 50 feet activity   Assist    Assist level: Minimal Assistance - Patient > 75% Assistive device: Walker-rolling     Walk 150 feet activity   Assist Walk 150 feet activity did not occur: Safety/medical concerns         Walk 10 feet on uneven surface  activity   Assist Walk 10 feet on uneven surfaces activity did not occur: Safety/medical concerns         Wheelchair     Assist Is the patient using a wheelchair?: Yes Type of Wheelchair: Manual    Wheelchair assist level: Supervision/Verbal cueing Max wheelchair distance: 150 ft    Wheelchair 50 feet with 2 turns activity    Assist        Assist Level: Supervision/Verbal cueing   Wheelchair 150 feet activity     Assist      Assist Level: Supervision/Verbal cueing   Blood pressure 134/83, pulse 67, temperature 98.1 F (36.7 C), temperature source Oral, resp. rate 18, height 5\' 10"  (1.778 m), weight 86.9 kg, SpO2 97%.  Medical Problem List and Plan: 1. 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              -Plan for radiation treatments week of 10/14             -patient may shower             -ELOS/Goals: 10-12, PT/OT Supervision              D/c date 10/26  Con't CIR PT and PT  D/w Dr Cherly Hensen about pt's shingles dx- per ID. On contact/air precautions.  2.  Antithrombotics: -DVT/anticoagulation:  Mechanical:  Antiembolism stockings, knee (TED hose) Bilateral lower extremities. Awaiting call back from NS regarding initiating pharmacologic DVT prophylaxis-- now on Lovenox 40mg  BID             -antiplatelet therapy: none   3. Pain Management: Tylenol as needed             -continue Flexeril 10 mg TID prn             -continue Duragesic patch 12/mcg/hr q 72 hours -continue gabapentin 300 mg TID             -continue Lidoderm              -continue oxycodone 10 mg q 4 hrs prn>> d/c'd             -continue ibuprofen 400 mg q 6 hrs prn -10/10- will change Duragesic to 25 mcg q3 days as  well as reduce Oxy to 5 mg q4 hours prn- since feels like patch more effective than Oxy.  -10/11- pain slightly better, but Oxy doesn't work- will change Oxy  to Dilaudid 2-4 mg q4 hours prn and maintain Duragesic patch 25 mcg- pt happy with change.  -04/17/23 pain intensity worse today but worked hard yesterday; not needing pain meds too much before they're "due" so will leave meds in place for now; defer adjustments to weekday team if needed.  10/14- pain adequate control per pt- will con't current regimen for now 10/15- main pain is at level SCI pain- will increase gabapentin to 400 mg TID_ if tolerates, will go to 600 mg TID- cannot add Duloxetine since on high dose of Lexapro-  10/16- will increase gabapentin tomorrow to 600 mg TID 10/18- feels like doesn't need meds changes 4. Mood/Behavior/Sleep: LCSW to evaluate and provide emotional support             -History of GAD             -continue Lexapro 30 mg daily             -continue Seroquel 200 mg q HS             -continue trazodone 25 mg q HS             -continue Xanax 0.25 mg BID prn             -continue melatonin 3 mg q HS prn              10/14- sleeping well  10/15- somewhat anxious today since schedule of therapy and radiation/oncology appts overlap- d/w SW_ will look into it- Oncology takes priority.   10/16- doing better after yesterday  10/17- more anxious today- asked him to try Xanax that has Rx for prn  10/18- dealt with radiation day- which stresses him out- took Xanax yesterday to help with transfer to [facility] 5. Neuropsych/cognition: This patient is capable of making decisions on his own behalf.   6. Skin/Wound Care: Routine skin care checks             -monitor incision>>keep the Steri-Strips dry during showers per Dr. Wynetta Emery   7. Fluids/Electrolytes/Nutrition: Routine Is and Os and follow-up chemistries             -continue Mag-ox 400 mg BID             -continue B12 1000 mcg daily              -Hypokalemia/Hyponatremia- Recheck labs tomorrow -10/10- K+ 5.0- and Na 137- had been hypokalemic- so likely hemolysis- wil recheck Monday 10/15- K+ 5.0- top end of normal 8: Hypertension: controlled, monitor TID and prn   -04/17/23 BPs sort of variable but mostly ok, monitor for now.   10/16-10/18- BP doing well -controlled- cont' regimen  Vitals:   04/18/23 1247 04/18/23 1900 04/19/23 0451 04/19/23 0456  BP: 129/87 (!) 134/97 (!) 159/89 129/64   04/19/23 2009 04/20/23 0344 04/20/23 1354 04/20/23 2031  BP: 136/70 114/70 113/75 (!) 161/85   04/21/23 0650 04/21/23 1443 04/21/23 2042 04/22/23 0549  BP: 132/89 115/81 135/69 134/83    9: GERD: continue Protonix 40mg  BID   10: Current History of prostate cancer with mets to spine s/p decompressive laminectomy T3 and evacuation of epidural tumor Dr. Wynetta Emery 09/26             -continue Xtandi             -continue Decadron 4mg  q6h             -no brace needed; no twisting, arching, bending             -  follow-up with Dr. Wynetta Emery, Dr. Mosetta Putt and Dr. Kathrynn Running   -10/11- starting radiation next week per notes-   10/14- starting radiation this week  10/15- starting today- d/w pharmacy- restarting Zometa and Lupron per Dr Cherly Hensen and pharmacy. 10/16 - started radiation yesterday 10/17- more radiation today- is concerned won't go well today- asked  if pt wanted to try Xanax for anxiety related to today-  10/18- used Xanax- went a little better 11: Anemia: multifactorial             -follow-up CBC   -10/10- Hb 9.9 a little better 12. Constipation, cont miralax BID and senokotS 1 tab BID -10/10- will try Sorbitol 15cc- since doesn't want to blow out- since LBM 2 days ago- and feels constipated. -10/11- didn't have BM- will order Sorbitol 30cc and pt to take prune juice at lunch- ordered at 4pm.  10/16- LBM 2 days ago- but feels like could go today 13. Heart murmur:  -04/16/23 pt noted to have heart murmur, has been mentioned to him before, doubt need for  emergent w/up although might need cardiology f/up for echo/eval.  14. Blisters on chest- Herpes Zoster/Shingles  10/16- not painful- don't look like shingles- looks like reaction to tape? Keep covered and monitor  10/17- I'm not sure if has shingles- is on precautions for it- will call ID since Infection prevention not able to decide.   - have paged Dr Daiva Eves-   Dr Daiva Eves said will get viral PCR so we are certain if shingles or not.   10/18- Oral Acyclovir changed to Valtrex by ID.    I spent a total of  41  minutes on total care today- >50% coordination of care- due to  D.w Dr Cherly Hensen and reviewing ID note- also d/w nursing about plan- and reviewing Infection control note-   LOS: 9 days A FACE TO FACE EVALUATION WAS PERFORMED  Iline Buchinger 04/22/2023, 11:14 AM

## 2023-04-22 NOTE — Progress Notes (Signed)
Physical Therapy Session Note  Patient Details  Name: Nathan Berg MRN: [id number] Date of Birth: 1955-08-15  Today's Date: 04/22/2023 PT Individual Time: 0845-1000 PT Individual Time Calculation (min): 75 min   Short Term Goals: Week 2:  PT Short Term Goal 1 (Week 2): =LTGs d/t ELOS  Skilled Therapeutic Interventions/Progress Updates:    Pt received in recliner and agreeable to therapy.  Pt reports continued "rubber band" pain at T3 dermatome. Pt wore mask throughout session and therapist maintained airborne precaution PPE throughout.   Pt ambulated to ortho gym with CGA and RW. Very mild ataxia still noted with gait but improving knee flexion posture. Pt limited by fatigue this morning, requiring extended rest breaks. During rest breaks, discussed activating EMS in case of falls d/t increased fracture risk and general home safety.   Pt performed 4 x 6 step ups on 6" step with RW, guarding for L knee for continued instability. Cueing for TKE without hyperextension.  Sit to stand x 6 and x 4 with 6.6 lb ball, limited by fatigue, cues for mechanics and CGA for balance.  Pt performed car transfer with cues and CGA before returning to room. Pt was transported back to room d/t fatigue and remained in recliner, was left with all needs in reach and alarm active.   Therapy Documentation Precautions:  Precautions Precautions: Fall, Back Precaution Comments: no brace needed; recalled 3/3 precautions without cue Restrictions Weight Bearing Restrictions: No General:       Therapy/Group: Individual Therapy  Juluis Rainier 04/22/2023, 12:11 PM

## 2023-04-22 NOTE — Progress Notes (Signed)
Occupational Therapy Session Note  Patient Details  Name: Nathan Berg MRN: [id number] Date of Birth: 01-Dec-1955  Today's Date: 04/22/2023 OT Individual Time: 2956-2130 OT Individual Time Calculation (min): 70 min    Short Term Goals: Week 1:  OT Short Term Goal 1 (Week 1): STG=LTG d/t ELOS  Skilled Therapeutic Interventions/Progress Update: Pt resting in recliner upon arrival with RN present. Per RN, awaiting PA Dois Davenport to evaluate possible spread of Shingles. Pt remained in room awaiting assessment. OT intervention with focus on discharge planning, BUE therex, sit<>stand/squats, and energy conservation strategies. Squats3x10 with no BUE support with rest breaks. BUE therex with green theraband-punches 3 x 10 with rest breaks. Ongoing discharge planning. Pt SOB after exercises. Pt remained in recliner awaiting PA. All needs within reach.   Therapy Documentation Precautions:  Precautions Precautions: Fall, Back Precaution Comments: no brace needed; recalled 3/3 precautions without cue Restrictions Weight Bearing Restrictions: No   Pain: Pt c/o ongoing chest pain (Shingles); RN and MD aware   Therapy/Group: Individual Therapy  Rich Brave 04/22/2023, 12:02 PM

## 2023-04-22 NOTE — Progress Notes (Signed)
Occupational Therapy Session Note  Patient Details  Name: MCNEIL VALLO MRN: [id number] Date of Birth: 03/11/1956  Today's Date: 04/22/2023 OT Individual Time: 1345-1425 OT Individual Time Calculation (min): 40 min    Short Term Goals: Week 1:  OT Short Term Goal 1 (Week 1): STG=LTG d/t ELOS  Skilled Therapeutic Interventions/Progress Updates:    Pt resting in recliner upon arrival. Still no resolution on precautions/isolation. Therapy in room. Pt demonstrated BUE/BLE therex with green theraband for conditioning/strenghthening. Pt requested to use toilet. Amb with RW to bathroom with CGA. Toileting with supervision. Pt returned to room and reamined in recliner. All needs wihtin reach.   Therapy Documentation Precautions:  Precautions Precautions: Fall, Back Precaution Comments: no brace needed; recalled 3/3 precautions without cue Restrictions Weight Bearing Restrictions: No   Pain: Pain Assessment Pain Scale: 0-10 Pain Score: 4  Pain Type: Acute pain Pain Location: Back Pain Orientation: Mid Pain Radiating Towards: chest;lower Pain Descriptors / Indicators: Burning Pain Frequency: Constant Pain Onset: On-going Pain Intervention(s): meds admin prior to therapy   Therapy/Group: Individual Therapy  Rich Brave 04/22/2023, 2:34 PM

## 2023-04-22 NOTE — Plan of Care (Signed)
Infection Prevention  Unit: Regarding: Pt with presumed Zoster in one Dermatome IP Recommendation: Per Standard and Transmission Based Precautions policy, pt may remain on with the following conditions:  - Area of concern remains in one dermatome.  - Area of concern is covered with a dry dressing and any drainage is contained.  - May participate in Rehab Gyms as long as patient has a dressing on area of concern and is clean and intact.  Important considerations: - If blisters/lesions spread from current area to other parts of body, patient will need to be transferred to inpatient and to an AIIR room (negative pressure). - Insure gym areas are cleaned and sanitized after patient departs area. Please reach out to Infection Prevention for any concerns.

## 2023-04-23 DIAGNOSIS — I10 Essential (primary) hypertension: Secondary | ICD-10-CM | POA: Diagnosis not present

## 2023-04-23 DIAGNOSIS — R011 Cardiac murmur, unspecified: Secondary | ICD-10-CM | POA: Diagnosis not present

## 2023-04-23 DIAGNOSIS — M4804 Spinal stenosis, thoracic region: Secondary | ICD-10-CM | POA: Diagnosis not present

## 2023-04-23 NOTE — Progress Notes (Signed)
PROGRESS NOTE   Subjective/Complaints:   Pt doing ok, slept well! Pain well managed which is great. LBM today. Urinating fine. Denies any other complaints or concerns today.    ROS: See HPI   Pt denies SOB, abd pain, CP, N/V/C/D, and vision changes   Pain limiting (+) pain his At level T3 SCI pain  Objective:   No results found. No results for input(s): "WBC", "HGB", "HCT", "PLT" in the last 72 hours.   No results for input(s): "NA", "K", "CL", "CO2", "GLUCOSE", "BUN", "CREATININE", "CALCIUM" in the last 72 hours.    Intake/Output Summary (Last 24 hours) at 04/23/2023 1127 Last data filed at 04/23/2023 0758 Gross per 24 hour  Intake 480 ml  Output 875 ml  Net -395 ml         Physical Exam: Vital Signs Blood pressure 137/87, pulse 67, temperature 97.7 F (36.5 C), temperature source Oral, resp. rate 16, height 5\' 10"  (1.778 m), weight 86.9 kg, SpO2 99%.    General: awake, alert, appropriate, NAD, sitting in bedside chair.  HENT: conjugate gaze; oropharynx moist CV: regular rate; no JVD, +systolic murmur LUSB Pulmonary: CTA B/L; no W/R/R- good air movement GI: soft, NT, ND, (+)BS Psychiatric: appropriate, pleasant Neurological: Ox3  PRIOR EXAMS: Skin- has 3 blisters- at T2/3 area- on L  chest- blisters popped and a few tiny blisters/ecchymoses- in same area- erythematous- but scabbing over nicely.   No signs of spasticity Has T3 TTP- c/o feeling tight  Skin- posterior Cervical spine incision- steristrips intact Neuro:  Alert and oriented x 3, follows commands, CN 2-12 intact, no speech or language deficits noted, good insight and awareness RUE: 5/5 Deltoid, 5/5 Biceps, 5/5 Triceps, 5/5 Wrist Ext, 5/5 Grip LUE: 4/5 Deltoid, 5/5 Biceps, 5/5 Triceps, 5/5 Wrist Ext, 5/5 Grip RLE: HF 5/5, KE 5/5, ADF 5/5, APF 5/5 LLE: HF 5/5, KE 5/5,  ADF 5/5, APF 5/5 Sensory exam normal for light touch and pain in all  4 limbs. No limb ataxia or cerebellar signs. No abnormal tone appreciated.  No ankle clonus Musculoskeletal: L shoulder pain and crepitus with PROM, no significant L shoulder tenderness   .  Assessment/Plan: 1. Functional deficits which require 3+ hours per day of interdisciplinary therapy in a comprehensive inpatient rehab setting. Physiatrist is providing close team supervision and 24 hour management of active medical problems listed below. Physiatrist and rehab team continue to assess barriers to discharge/monitor patient progress toward functional and medical goals  Care Tool:  Bathing    Body parts bathed by patient: Right arm, Left lower leg, Face, Left arm, Chest, Abdomen, Front perineal area, Buttocks, Right upper leg, Left upper leg, Right lower leg         Bathing assist Assist Level: Supervision/Verbal cueing     Upper Body Dressing/Undressing Upper body dressing   What is the patient wearing?: Pull over shirt    Upper body assist Assist Level: Supervision/Verbal cueing    Lower Body Dressing/Undressing Lower body dressing            Lower body assist Assist for lower body dressing: Contact Guard/Touching assist     Toileting Toileting    Toileting assist  Assist for toileting: Supervision/Verbal cueing     Transfers Chair/bed transfer  Transfers assist     Chair/bed transfer assist level: Minimal Assistance - Patient > 75%     Locomotion Ambulation   Ambulation assist      Assist level: Minimal Assistance - Patient > 75% Assistive device: Walker-rolling Max distance: 84 ft   Walk 10 feet activity   Assist     Assist level: Minimal Assistance - Patient > 75% Assistive device: Walker-rolling   Walk 50 feet activity   Assist    Assist level: Minimal Assistance - Patient > 75% Assistive device: Walker-rolling    Walk 150 feet activity   Assist Walk 150 feet activity did not occur: Safety/medical concerns         Walk 10  feet on uneven surface  activity   Assist Walk 10 feet on uneven surfaces activity did not occur: Safety/medical concerns         Wheelchair     Assist Is the patient using a wheelchair?: Yes Type of Wheelchair: Manual    Wheelchair assist level: Supervision/Verbal cueing Max wheelchair distance: 150 ft    Wheelchair 50 feet with 2 turns activity    Assist        Assist Level: Supervision/Verbal cueing   Wheelchair 150 feet activity     Assist      Assist Level: Supervision/Verbal cueing   Blood pressure 137/87, pulse 67, temperature 97.7 F (36.5 C), temperature source Oral, resp. rate 16, height 5\' 10"  (1.778 m), weight 86.9 kg, SpO2 99%.  Medical Problem List and Plan: 1. 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              -Plan for radiation treatments week of 10/14             -patient may shower             -ELOS/Goals: 10-12, PT/OT Supervision              D/c date 10/26  Con't CIR PT and PT  D/w Dr Cherly Hensen about pt's shingles dx- per ID. On contact/air precautions 2.  Antithrombotics: -DVT/anticoagulation:  Mechanical:  Antiembolism stockings, knee (TED hose) Bilateral lower extremities. Awaiting call back from NS regarding initiating pharmacologic DVT prophylaxis-- now on Lovenox 40mg  BID             -antiplatelet therapy: none   3. Pain Management: Tylenol as needed             -continue Flexeril 10 mg TID prn             -continue Duragesic patch 12/mcg/hr q 72 hours -continue gabapentin 300 mg TID             -continue Lidoderm              -continue oxycodone 10 mg q 4 hrs prn>> d/c'd             -continue ibuprofen 400 mg q 6 hrs prn -10/10- will change Duragesic to 25 mcg q3 days as well as reduce Oxy to 5 mg q4 hours prn- since feels like patch more effective than Oxy.  -10/11- pain slightly better, but  Oxy doesn't work- will change Oxy to Dilaudid 2-4 mg q4 hours prn and maintain Duragesic patch 25 mcg- pt happy with change.  -04/17/23 pain intensity worse today  but worked hard yesterday; not needing pain meds too much before they're "due" so will leave meds in place for now; defer adjustments to weekday team if needed.  10/14- pain adequate control per pt- will con't current regimen for now 10/15- main pain is at level SCI pain- will increase gabapentin to 400 mg TID_ if tolerates, will go to 600 mg TID- cannot add Duloxetine since on high dose of Lexapro-  10/16- will increase gabapentin tomorrow to 600 mg TID 10/18- feels like doesn't need meds changes 4. Mood/Behavior/Sleep: LCSW to evaluate and provide emotional support             -History of GAD             -continue Lexapro 30 mg daily             -continue Seroquel 200 mg q HS             -continue trazodone 25 mg q HS             -continue Xanax 0.25 mg BID prn             -continue melatonin 3 mg q HS prn              10/14- sleeping well 10/15- somewhat anxious today since schedule of therapy and radiation/oncology appts overlap- d/w SW_ will look into it- Oncology takes priority.   10/16- doing better after yesterday  10/17- more anxious today- asked him to try Xanax that has Rx for prn 10/18- dealt with radiation day- which stresses him out- took Xanax yesterday to help with transfer to [facility] 5. Neuropsych/cognition: This patient is capable of making decisions on his own behalf.   6. Skin/Wound Care: Routine skin care checks             -monitor incision>>keep the Steri-Strips dry during showers per Dr. Wynetta Emery   7. Fluids/Electrolytes/Nutrition: Routine Is and Os and follow-up chemistries             -continue Mag-ox 400 mg BID             -continue B12 1000 mcg daily             -Hypokalemia/Hyponatremia- Recheck labs tomorrow -10/10- K+ 5.0- and Na 137- had been hypokalemic- so likely hemolysis- wil recheck Monday 10/15- K+  5.0- top end of normal 8: Hypertension: controlled, monitor TID and prn   -04/17/23 BPs sort of variable but mostly ok, monitor for now.   10/16-19- BP doing well -controlled- cont' regimen  Vitals:   04/19/23 0456 04/19/23 2009 04/20/23 0344 04/20/23 1354  BP: 129/64 136/70 114/70 113/75   04/20/23 2031 04/21/23 0650 04/21/23 1443 04/21/23 2042  BP: (!) 161/85 132/89 115/81 135/69   04/22/23 0549 04/22/23 1532 04/22/23 2015 04/23/23 0603  BP: 134/83 114/64 135/78 137/87    9: GERD: continue Protonix 40mg  BID   10: Current History of prostate cancer with mets to spine s/p decompressive laminectomy T3 and evacuation of epidural tumor Dr. Wynetta Emery 09/26             -continue Xtandi             -continue Decadron 4mg  q6h             -no brace needed; no twisting, arching, bending             -follow-up with Dr. Wynetta Emery, Dr. Mosetta Putt and Dr. Kathrynn Running   -10/11- starting radiation next week per  notes-   10/14- starting radiation this week 10/15- starting today- d/w pharmacy- restarting Zometa and Lupron per Dr Cherly Hensen and pharmacy. 10/16 - started radiation yesterday 10/17- more radiation today- is concerned won't go well today- asked  if pt wanted to try Xanax for anxiety related to today-  10/18- used Xanax- went a little better 11: Anemia: multifactorial             -follow-up CBC   -10/10- Hb 9.9 a little better 12. Constipation, cont miralax BID and senokotS 1 tab BID -10/10- will try Sorbitol 15cc- since doesn't want to blow out- since LBM 2 days ago- and feels constipated. -10/11- didn't have BM- will order Sorbitol 30cc and pt to take prune juice at lunch- ordered at 4pm.  04/23/23 LBM today, cont regimen 13. Heart murmur:  -04/16/23 pt noted to have heart murmur, has been mentioned to him before, doubt need for emergent w/up although might need cardiology f/up for echo/eval.  14. Blisters on chest- Herpes Zoster/Shingles 10/16- not painful- don't look like shingles- looks like reaction to  tape? Keep covered and monitor 10/17- I'm not sure if has shingles- is on precautions for it- will call ID since Infection prevention not able to decide.   - have paged Dr Daiva Eves-   Dr Daiva Eves said will get viral PCR so we are certain if shingles or not.   10/18- Oral Acyclovir changed to Valtrex by ID.    LOS: 10 days A FACE TO FACE EVALUATION WAS PERFORMED  [address] 04/23/2023, 11:27 AM

## 2023-04-24 DIAGNOSIS — I10 Essential (primary) hypertension: Secondary | ICD-10-CM | POA: Diagnosis not present

## 2023-04-24 DIAGNOSIS — M4804 Spinal stenosis, thoracic region: Secondary | ICD-10-CM | POA: Diagnosis not present

## 2023-04-24 DIAGNOSIS — R011 Cardiac murmur, unspecified: Secondary | ICD-10-CM | POA: Diagnosis not present

## 2023-04-24 NOTE — Progress Notes (Signed)
PROGRESS NOTE   Subjective/Complaints:   Pt doing alright, slept ok. Pain well managed still. LBM yesterday. Urinating fine. Denies any other complaints or concerns today.    ROS: See HPI   Pt denies SOB, abd pain, CP, N/V/C/D, and vision changes   Pain limiting (+) pain his At level T3 SCI pain  Objective:   No results found. No results for input(s): "WBC", "HGB", "HCT", "PLT" in the last 72 hours.   No results for input(s): "NA", "K", "CL", "CO2", "GLUCOSE", "BUN", "CREATININE", "CALCIUM" in the last 72 hours.    Intake/Output Summary (Last 24 hours) at 04/24/2023 1028 Last data filed at 04/24/2023 0847 Gross per 24 hour  Intake 717 ml  Output 1000 ml  Net -283 ml         Physical Exam: Vital Signs Blood pressure (!) 142/99, pulse 66, temperature 97.6 F (36.4 C), temperature source Oral, resp. rate 16, height 5\' 10"  (1.778 m), weight 86.9 kg, SpO2 98%.    General: awake, alert, appropriate, NAD, sitting in bedside chair.  HENT: conjugate gaze; oropharynx moist CV: regular rate; no JVD, +systolic murmur LUSB Pulmonary: CTA B/L; no W/R/R- good air movement GI: soft, NT, ND, (+)BS Psychiatric: a bit more irritated this morning Neurological: Ox3  PRIOR EXAMS: Skin- has 3 blisters- at T2/3 area- on L  chest- blisters popped and a few tiny blisters/ecchymoses- in same area- erythematous- but scabbing over nicely.   No signs of spasticity Has T3 TTP- c/o feeling tight  Skin- posterior Cervical spine incision- steristrips intact Neuro:  Alert and oriented x 3, follows commands, CN 2-12 intact, no speech or language deficits noted, good insight and awareness RUE: 5/5 Deltoid, 5/5 Biceps, 5/5 Triceps, 5/5 Wrist Ext, 5/5 Grip LUE: 4/5 Deltoid, 5/5 Biceps, 5/5 Triceps, 5/5 Wrist Ext, 5/5 Grip RLE: HF 5/5, KE 5/5, ADF 5/5, APF 5/5 LLE: HF 5/5, KE 5/5,  ADF 5/5, APF 5/5 Sensory exam normal for light touch  and pain in all 4 limbs. No limb ataxia or cerebellar signs. No abnormal tone appreciated.  No ankle clonus Musculoskeletal: L shoulder pain and crepitus with PROM, no significant L shoulder tenderness   .  Assessment/Plan: 1. Functional deficits which require 3+ hours per day of interdisciplinary therapy in a comprehensive inpatient rehab setting. Physiatrist is providing close team supervision and 24 hour management of active medical problems listed below. Physiatrist and rehab team continue to assess barriers to discharge/monitor patient progress toward functional and medical goals  Care Tool:  Bathing    Body parts bathed by patient: Right arm, Left lower leg, Face, Left arm, Chest, Abdomen, Front perineal area, Buttocks, Right upper leg, Left upper leg, Right lower leg         Bathing assist Assist Level: Supervision/Verbal cueing     Upper Body Dressing/Undressing Upper body dressing   What is the patient wearing?: Pull over shirt    Upper body assist Assist Level: Supervision/Verbal cueing    Lower Body Dressing/Undressing Lower body dressing            Lower body assist Assist for lower body dressing: Contact Guard/Touching assist     Toileting Toileting  Toileting assist Assist for toileting: Supervision/Verbal cueing     Transfers Chair/bed transfer  Transfers assist     Chair/bed transfer assist level: Minimal Assistance - Patient > 75%     Locomotion Ambulation   Ambulation assist      Assist level: Minimal Assistance - Patient > 75% Assistive device: Walker-rolling Max distance: 84 ft   Walk 10 feet activity   Assist     Assist level: Minimal Assistance - Patient > 75% Assistive device: Walker-rolling   Walk 50 feet activity   Assist    Assist level: Minimal Assistance - Patient > 75% Assistive device: Walker-rolling    Walk 150 feet activity   Assist Walk 150 feet activity did not occur: Safety/medical concerns          Walk 10 feet on uneven surface  activity   Assist Walk 10 feet on uneven surfaces activity did not occur: Safety/medical concerns         Wheelchair     Assist Is the patient using a wheelchair?: Yes Type of Wheelchair: Manual    Wheelchair assist level: Supervision/Verbal cueing Max wheelchair distance: 150 ft    Wheelchair 50 feet with 2 turns activity    Assist        Assist Level: Supervision/Verbal cueing   Wheelchair 150 feet activity     Assist      Assist Level: Supervision/Verbal cueing   Blood pressure (!) 142/99, pulse 66, temperature 97.6 F (36.4 C), temperature source Oral, resp. rate 16, height 5\' 10"  (1.778 m), weight 86.9 kg, SpO2 98%.  Medical Problem List and Plan: 1. 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              -Plan for radiation treatments week of 10/14             -patient may shower             -ELOS/Goals: 10-12, PT/OT Supervision              D/c date 10/26  Con't CIR PT and PT  D/w Dr Cherly Hensen about pt's shingles dx- per ID. On contact/air precautions 2.  Antithrombotics: -DVT/anticoagulation:  Mechanical:  Antiembolism stockings, knee (TED hose) Bilateral lower extremities. Awaiting call back from NS regarding initiating pharmacologic DVT prophylaxis-- now on Lovenox 40mg  BID             -antiplatelet therapy: none   3. Pain Management: Tylenol as needed             -continue Flexeril 10 mg TID prn             -continue Duragesic patch 12/mcg/hr q 72 hours -continue gabapentin 300 mg TID             -continue Lidoderm              -continue oxycodone 10 mg q 4 hrs prn>> d/c'd             -continue ibuprofen 400 mg q 6 hrs prn -10/10- will change Duragesic to 25 mcg q3 days as well as reduce Oxy to 5 mg q4 hours prn- since feels like patch more effective than Oxy.  -10/11- pain  slightly better, but Oxy doesn't work- will change Oxy to Dilaudid 2-4 mg q4 hours prn and maintain Duragesic patch 25 mcg- pt happy with change.  -04/17/23 pain  intensity worse today but worked hard yesterday; not needing pain meds too much before they're "due" so will leave meds in place for now; defer adjustments to weekday team if needed.  10/14- pain adequate control per pt- will con't current regimen for now 10/15- main pain is at level SCI pain- will increase gabapentin to 400 mg TID_ if tolerates, will go to 600 mg TID- cannot add Duloxetine since on high dose of Lexapro-  10/16- will increase gabapentin tomorrow to 600 mg TID -04/24/23 pain well controlled. 4. Mood/Behavior/Sleep: LCSW to evaluate and provide emotional support             -History of GAD             -continue Lexapro 30 mg daily             -continue Seroquel 200 mg q HS             -continue trazodone 25 mg q HS             -continue Xanax 0.25 mg BID prn             -continue melatonin 3 mg q HS prn              10/14- sleeping well 10/15- somewhat anxious today since schedule of therapy and radiation/oncology appts overlap- d/w SW_ will look into it- Oncology takes priority.   10/16- doing better after yesterday  10/17- more anxious today- asked him to try Xanax that has Rx for prn 10/18- dealt with radiation day- which stresses him out- took Xanax yesterday to help with transfer to [facility] 5. Neuropsych/cognition: This patient is capable of making decisions on his own behalf.   6. Skin/Wound Care: Routine skin care checks             -monitor incision>>keep the Steri-Strips dry during showers per Dr. Wynetta Emery   7. Fluids/Electrolytes/Nutrition: Routine Is and Os and follow-up chemistries             -continue Mag-ox 400 mg BID             -continue B12 1000 mcg daily             -Hypokalemia/Hyponatremia- Recheck labs tomorrow -10/10- K+ 5.0- and Na 137- had been hypokalemic- so likely hemolysis- wil recheck  Monday 10/15- K+ 5.0- top end of normal 8: Hypertension: controlled, monitor TID and prn   -04/17/23 BPs sort of variable but mostly ok, monitor for now.   10/16-19- BP doing well -controlled- cont' regimen   -04/24/23 BPs a little up this morning but overall fairly stable. Vitals:   04/20/23 1354 04/20/23 2031 04/21/23 0650 04/21/23 1443  BP: 113/75 (!) 161/85 132/89 115/81   04/21/23 2042 04/22/23 0549 04/22/23 1532 04/22/23 2015  BP: 135/69 134/83 114/64 135/78   04/23/23 0603 04/23/23 1351 04/23/23 1956 04/24/23 0513  BP: 137/87 135/80 (!) 148/83 (!) 142/99    9: GERD: continue Protonix 40mg  BID   10: Current History of prostate cancer with mets to spine s/p decompressive laminectomy T3 and evacuation of epidural tumor Dr. Wynetta Emery 09/26             -continue Xtandi             -continue Decadron 4mg  q6h             -no brace needed; no twisting, arching, bending             -follow-up with Dr.  Wynetta Emery, Dr. Mosetta Putt and Dr. Kathrynn Running   -10/11- starting radiation next week per notes-   10/14- starting radiation this week 10/15- starting today- d/w pharmacy- restarting Zometa and Lupron per Dr Cherly Hensen and pharmacy. 10/16 - started radiation yesterday 10/17- more radiation today- is concerned won't go well today- asked  if pt wanted to try Xanax for anxiety related to today-  10/18- used Xanax- went a little better 11: Anemia: multifactorial             -follow-up CBC   -10/10- Hb 9.9 a little better 12. Constipation, cont miralax BID and senokotS 1 tab BID -10/10- will try Sorbitol 15cc- since doesn't want to blow out- since LBM 2 days ago- and feels constipated. -10/11- didn't have BM- will order Sorbitol 30cc and pt to take prune juice at lunch- ordered at 4pm.  -04/24/23 LBM yesterday, cont regimen 13. Heart murmur:  -04/16/23 pt noted to have heart murmur, has been mentioned to him before, doubt need for emergent w/up although might need cardiology f/up for echo/eval.  14. Blisters on  chest- Herpes Zoster/Shingles 10/16- not painful- don't look like shingles- looks like reaction to tape? Keep covered and monitor 10/17- I'm not sure if has shingles- is on precautions for it- will call ID since Infection prevention not able to decide.   - have paged Dr Daiva Eves-   Dr Daiva Eves said will get viral PCR so we are certain if shingles or not.   10/18- Oral Acyclovir changed to Valtrex by ID.    LOS: 11 days A FACE TO FACE EVALUATION WAS PERFORMED  [id number] 04/24/2023, 10:28 AM

## 2023-04-24 NOTE — Plan of Care (Signed)
  Problem: Activity: Goal: Ability to tolerate increased activity will improve Outcome: Progressing Goal: Will remain free from falls Outcome: Progressing   Problem: Clinical Measurements: Goal: Ability to maintain clinical measurements within normal limits will improve Outcome: Progressing   Problem: Pain Management: Goal: Pain level will decrease Outcome: Progressing   Problem: Skin Integrity: Goal: Will show signs of wound healing Outcome: Progressing

## 2023-04-25 ENCOUNTER — Other Ambulatory Visit: Payer: Self-pay

## 2023-04-25 ENCOUNTER — Telehealth: Payer: Self-pay | Admitting: Pharmacy Technician

## 2023-04-25 ENCOUNTER — Other Ambulatory Visit (HOSPITAL_COMMUNITY): Payer: Self-pay

## 2023-04-25 ENCOUNTER — Encounter: Payer: Self-pay | Admitting: Hematology

## 2023-04-25 ENCOUNTER — Ambulatory Visit
Admit: 2023-04-25 | Discharge: 2023-04-25 | Disposition: A | Discharge status: Home / Self Care | Payer: Medicare Other | Attending: Radiation Oncology | Admitting: Radiation Oncology

## 2023-04-25 ENCOUNTER — Ambulatory Visit: Payer: Medicare Other

## 2023-04-25 ENCOUNTER — Other Ambulatory Visit: Payer: Self-pay | Admitting: Genetic Counselor

## 2023-04-25 DIAGNOSIS — M4714 Other spondylosis with myelopathy, thoracic region: Secondary | ICD-10-CM | POA: Diagnosis not present

## 2023-04-25 DIAGNOSIS — Z1379 Encounter for other screening for genetic and chromosomal anomalies: Secondary | ICD-10-CM

## 2023-04-25 DIAGNOSIS — C7951 Secondary malignant neoplasm of bone: Secondary | ICD-10-CM

## 2023-04-25 DIAGNOSIS — Z510 Encounter for antineoplastic radiation therapy: Secondary | ICD-10-CM | POA: Diagnosis not present

## 2023-04-25 LAB — BASIC METABOLIC PANEL
Anion gap: 9 (ref 5–15)
BUN: 14 mg/dL (ref 8–23)
CO2: 29 mmol/L (ref 22–32)
Calcium: 8.2 mg/dL — ABNORMAL LOW (ref 8.9–10.3)
Chloride: 99 mmol/L (ref 98–111)
Creatinine, Ser: 0.65 mg/dL (ref 0.61–1.24)
GFR, Estimated: >60 mL/min (ref >60)
Glucose, Bld: 108 mg/dL — ABNORMAL HIGH (ref 70–99)
Potassium: 4.6 mmol/L (ref 3.5–5.1)
Sodium: 137 mmol/L (ref 135–145)

## 2023-04-25 LAB — RAD ONC ARIA SESSION SUMMARY
Course Elapsed Days: 6
Plan Fractions Treated to Date: 3
Plan Prescribed Dose Per Fraction: 8 Gy
Plan Total Fractions Prescribed: 5
Plan Total Prescribed Dose: 40 Gy
Reference Point Dosage Given to Date: 24 Gy
Reference Point Session Dosage Given: 8 Gy
Session Number: 3

## 2023-04-25 LAB — CBC
HCT: 30.1 % — ABNORMAL LOW (ref 39.0–52.0)
Hemoglobin: 9.5 g/dL — ABNORMAL LOW (ref 13.0–17.0)
MCH: 30.5 pg (ref 26.0–34.0)
MCHC: 31.6 g/dL (ref 30.0–36.0)
MCV: 96.8 fL (ref 80.0–100.0)
Platelets: 351 10*3/uL (ref 150–400)
RBC: 3.11 MIL/uL — ABNORMAL LOW (ref 4.22–5.81)
RDW: 12.9 % (ref 11.5–15.5)
WBC: 4.5 10*3/uL (ref 4.0–10.5)
nRBC: 0 % (ref 0.0–0.2)

## 2023-04-25 LAB — VARICELLA-ZOSTER BY PCR: Varicella-Zoster, PCR: POSITIVE — AB

## 2023-04-25 MED ORDER — ALPRAZOLAM 0.5 MG PO TABS
0.5000 mg | ORAL_TABLET | ORAL | Status: DC
  Administered 2023-04-25 – 2023-04-29 (×2): 0.5 mg via ORAL
  Filled 2023-04-25 (×2): qty 1

## 2023-04-25 MED ORDER — AMANTADINE HCL 100 MG PO CAPS
100.0000 mg | ORAL_CAPSULE | Freq: Every day | ORAL | Status: DC
  Administered 2023-04-25 – 2023-04-30 (×6): 100 mg via ORAL
  Filled 2023-04-25 (×6): qty 1

## 2023-04-25 MED ORDER — GABAPENTIN 300 MG PO CAPS
900.0000 mg | ORAL_CAPSULE | Freq: Three times a day (TID) | ORAL | Status: DC
  Administered 2023-04-25 – 2023-04-30 (×15): 900 mg via ORAL
  Filled 2023-04-25 (×15): qty 3

## 2023-04-25 NOTE — Progress Notes (Signed)
Occupational Therapy Session Note  Patient Details  Name: Nathan Berg MRN: [id number] Date of Birth: 04-03-1956  Today's Date: 04/25/2023 OT Individual Time: 1300-1330 OT Individual Time Calculation (min): 30 min  and Today's Date: 04/25/2023 OT Missed Time: 30 Minutes Missed Time Reason: Patient fatigue   Short Term Goals: Week 1:  OT Short Term Goal 1 (Week 1): STG=LTG d/t ELOS  Skilled Therapeutic Interventions/Progress Updates:    Pt resting in recliner upon arrival. Pt reported that he was "really fatigued" from morning therapy. Pt reported he "woke up tired." OT intervention with focus on discharge planning and general conditioning for increased strength and endurance. Squats without BUE 3x10. STS 3x10 without BUE. Extended rest breaks. Pt missed 30 mins skilled OT services 2/2 fatigue. Will attempt to see again as schedule allows.   Therapy Documentation Precautions:  Precautions Precautions: Fall, Back Precaution Comments: no brace needed; recalled 3/3 precautions without cue Restrictions Weight Bearing Restrictions: No General: General OT Amount of Missed Time: 30 Minutes Vital Signs:   Pain:     Therapy/Group: Individual Therapy  Rich Brave 04/25/2023, 1:43 PM

## 2023-04-25 NOTE — Progress Notes (Signed)
Physical Therapy Session Note  Patient Details  Name: Nathan Berg MRN: [id number] Date of Birth: [birth year]  Today's Date: 04/25/2023 PT Individual Time: 1000-1115 PT Individual Time Calculation (min): 75 min   Short Term Goals: Week 2:  PT Short Term Goal 1 (Week 2): =LTGs d/t ELOS  Skilled Therapeutic Interventions/Progress Updates:    Pt received in recliner and agreeable to therapy.  Reports 4/10 pain, continued T3 band, premedicated. Rest and positioning provided as needed.   Sit to stand with supervision throughout session.  Gait with CGA fading to  supervision with RW. Not greatly improved coordination. Figure 8's x 5 with RW, supervision progressing to CGA with fatigue. Cones placed first at 5 ft then at 3 ft for tighter turns. Not mild increase in ataxia and flexed knee posture with fatigue. Discussed pt will likely need to use a w/c for community mobility for safety, especially while in the midst of cancer treatment.  Pt participated in step ups on 6" step 3  x 8 with extended seated rest breaks for fatigue management. Also 1 episode of mild light headedness when performing both sides without a break,  improved with taking short rest break between sides. Note L>RLE weakness, knee block CGA provided for safety. Cues for appropriate TKE. Note quick jump from "feeling ok" to "running on fumes" with this activity. Discussed energy conservation principles.  Returned to room self propelling w/c with alternating BUE and BLE. Stand pivot transfer to recliner with CGA and no AD. Pt was left with all needs in reach and alarm active.      Therapy Documentation Precautions:  Precautions Precautions: Fall, Back Precaution Comments: no brace needed; recalled 3/3 precautions without cue Restrictions Weight Bearing Restrictions: No General:      Therapy/Group: Individual Therapy  Juluis Rainier 04/25/2023, 10:31 AM

## 2023-04-25 NOTE — Progress Notes (Signed)
Zoster lesions evolving. Vesicles are flat, not yet crusted over.

## 2023-04-25 NOTE — Telephone Encounter (Signed)
Oral Oncology Patient Advocate Encounter  Was successful in securing patient a $8,000 grant from [employer] to provide copayment coverage for [employer].  This will keep the out of pocket expense at $0.     [employer] ID: [id number]  I have spoken with the patient.   The billing information is as follows and has been shared with [facility].    RxBin: [id number] PCN: [id number] Member ID: [id number] Group ID: [id number] Dates of Eligibility: 03/26/23 through 03/24/24  Fund:  Prostate  Jinger Neighbors, CPhT-Adv Oncology Pharmacy Patient Advocate [facility] Direct Number: [phone]  Fax: [phone]

## 2023-04-25 NOTE — Plan of Care (Signed)
  Problem: SCI BOWEL ELIMINATION Goal: RH STG MANAGE BOWEL WITH ASSISTANCE Description: STG Manage Bowel with Mod I Assistance. Outcome: Progressing Goal: RH STG SCI MANAGE BOWEL WITH MEDICATION WITH ASSISTANCE Description: STG SCI Manage bowel with medication with mod I assistance. Outcome: Progressing Goal: RH STG MANAGE BOWEL W/EQUIPMENT W/ASSISTANCE Description: STG Manage Bowel With Equipment With Mod I Assistance Outcome: Progressing Goal: RH OTHER STG BOWEL ELIMINATION GOALS W/ASSIST Description: Other STG Bowel Elimination Goals With Mod I Assistance. Outcome: Progressing   Problem: SCI BLADDER ELIMINATION Goal: RH STG MANAGE BLADDER WITH ASSISTANCE Description: STG Manage Bladder With Mod I Assistance Outcome: Progressing Goal: RH STG MANAGE BLADDER WITH MEDICATION WITH ASSISTANCE Description: STG Manage Bladder With Medication With Mod I Assistance. Outcome: Progressing Goal: RH OTHER STG BLADDER ELIMINATION GOALS W/ASSIST Description: Other STG Bladder Elimination Goals With Assistance Outcome: Progressing   Problem: RH SKIN INTEGRITY Goal: RH STG SKIN FREE OF INFECTION/BREAKDOWN Outcome: Progressing Goal: RH STG MAINTAIN SKIN INTEGRITY WITH ASSISTANCE Description: STG Maintain Skin Integrity With Assistance. Outcome: Progressing   Problem: RH SAFETY Goal: RH STG ADHERE TO SAFETY PRECAUTIONS W/ASSISTANCE/DEVICE Description: STG Adhere to Safety Precautions With Assistance/Device. Outcome: Progressing   Problem: RH KNOWLEDGE DEFICIT SCI Goal: RH STG INCREASE KNOWLEDGE OF SELF CARE AFTER SCI Outcome: Progressing   Problem: Education: Goal: Ability to verbalize activity precautions or restrictions will improve Outcome: Progressing Goal: Knowledge of the prescribed therapeutic regimen will improve Outcome: Progressing Goal: Understanding of discharge needs will improve Outcome: Progressing   Problem: Activity: Goal: Ability to avoid complications of mobility  impairment will improve Outcome: Progressing Goal: Ability to tolerate increased activity will improve Outcome: Progressing Goal: Will remain free from falls Outcome: Progressing   Problem: Bowel/Gastric: Goal: Gastrointestinal status for postoperative course will improve Outcome: Progressing   Problem: Clinical Measurements: Goal: Ability to maintain clinical measurements within normal limits will improve Outcome: Progressing Goal: Postoperative complications will be avoided or minimized Outcome: Progressing Goal: Diagnostic test results will improve Outcome: Progressing   Problem: Pain Management: Goal: Pain level will decrease Outcome: Progressing   Problem: Skin Integrity: Goal: Will show signs of wound healing Outcome: Progressing   Problem: Health Behavior/Discharge Planning: Goal: Identification of resources available to assist in meeting health care needs will improve Outcome: Progressing   Problem: Bladder/Genitourinary: Goal: Urinary functional status for postoperative course will improve Outcome: Progressing

## 2023-04-25 NOTE — Progress Notes (Signed)
Patient is now scheduled for his PSMA PET on 10/30 with arrival time of 12:15 with anticipation of discharge date of 10/26.  Genetic lab appointment made after PSMA PET.    RN left message for patient for call back.

## 2023-04-25 NOTE — Progress Notes (Signed)
Occupational Therapy Session Note  Patient Details  Name: Nathan Berg MRN: [id number] Date of Birth: [birth year]  Today's Date: 04/25/2023 OT Individual Time: 4098-1191 OT Individual Time Calculation (min): 60 min    Short Term Goals: Week 1:  OT Short Term Goal 1 (Week 1): STG=LTG d/t ELOS Week 2:  OT Short Term Goal 1 (Week 2): STG=LTG d/t LOS      Skilled Therapeutic Interventions/Progress Updates:    Pt received in recliner ready for therapy.      Pt agreeable to a shower but needed to toilet first.  Supervision sit to stand and then using RW to ambulate to bathroom to sit on toilet.  Pt completed toileting with mod I and then stepped to shower with Supervision but does need cues to move slowly and also continues to need cues with back precautions.  Reminded pt to cross legs with doffing LB clothing vs leaning forward.  He dropped his shirt next to his feet and instinctively leaned forward to get it.  Pt will need a reacher at home that he can use for these situations.  Pt showered on bench with distant supervision.  After shower encouraged pt to dry off from bench before moving to chair in bathroom to dress.  He only partially dried off and needed cues to ensure water would not drip off body onto floor increasing possibility of slipping.   Pt able to don clothing following back precautions with no cues needed.  Pt ambulated to wc in room to be able to sit at sink to complete oral care.   Pt then wanted to transfer to recliner.  He had stated he could do so without the RW but encouraged him to always use the RW as a safeguard in case of sudden knee buckling.   Encouraged him this week to focus on recalling and adhering to his back precautions and to focus on his energy levels.  Pt stated right after the shower he felt he was a 6/10 fatigue level.  Discussed energy conservation strategies and to definitely stop and rest once his levels creep up to 5-6/10 to avoid hitting a fatigue wall.    Pt has been struggling with his frustration over the multiple medical issues and airborne, contact precautions he is on, but he continues to participate well in therapy as he is motivated to be stronger by his dc date.    Pt resting in recliner with all needs met. Alarm set and call light in reach.    Therapy Documentation Precautions:  Precautions Precautions: Fall, Back Precaution Comments: no brace needed; recalled 3/3 precautions without cue Restrictions Weight Bearing Restrictions: No   Pain: Pain Assessment Pain Score: 7  - mid back, premedicated ADL: ADL Equipment Provided: Long-handled sponge Eating: Independent Where Assessed-Eating: Chair Grooming: Independent Where Assessed-Grooming: Sitting at sink Upper Body Bathing: Supervision/safety Where Assessed-Upper Body Bathing: Shower Lower Body Bathing: Supervision/safety Where Assessed-Lower Body Bathing: Shower Upper Body Dressing: Independent Where Assessed-Upper Body Dressing: Chair Lower Body Dressing: Supervision/safety Where Assessed-Lower Body Dressing: Sitting at sink, Standing at sink Toileting: Modified independent Where Assessed-Toileting: Teacher, adult education: Close supervision Statistician Method: Proofreader: Raised toilet seat Tub/Shower Transfer: Not assessed Film/video editor: Close supervision Film/video editor Method: Designer, industrial/product: Grab bars, Transfer tub bench   Therapy/Group: Individual Therapy  Nathan Berg 04/25/2023, 11:35 AM

## 2023-04-25 NOTE — Plan of Care (Signed)
Pt's plan of care adjusted to 15/7 after speaking with care team and discussed with MD via secure chat as pt currently unable to tolerate current therapy schedule with OT, PT, and SLP.

## 2023-04-25 NOTE — Progress Notes (Signed)
PROGRESS NOTE   Subjective/Complaints:   Admits more irritable at small things Also just eating exhausted him this AM.  Also bored for no therapy for 2 days Frustrated about air/contact precautions as well.  LBM yesterday.  Also needs to change pants- split urine using urinal last night.    ROS: See HPI   Pt denies SOB, abd pain, CP, N/V/C/D, and vision changes   Pain limiting (+) pain his At level T3 SCI pain  Objective:   No results found. Recent Labs    04/25/23 0541  WBC 4.5  HGB 9.5*  HCT 30.1*  PLT 351     Recent Labs    04/25/23 0541  NA 137  K 4.6  CL 99  CO2 29  GLUCOSE 108*  BUN 14  CREATININE 0.65  CALCIUM 8.2*      Intake/Output Summary (Last 24 hours) at 04/25/2023 0836 Last data filed at 04/25/2023 0756 Gross per 24 hour  Intake 1197 ml  Output 1000 ml  Net 197 ml         Physical Exam: Vital Signs Blood pressure 137/78, pulse 66, temperature 97.8 F (36.6 C), resp. rate 17, height 5\' 10"  (1.778 m), weight 86.9 kg, SpO2 98%.     General: awake, alert, appropriate, sitting up in bedside chair; NAD HENT: conjugate gaze; oropharynx moist CV: regular rate and rhythm; no JVD Pulmonary: CTA B/L; no W/R/R- good air movement however increased rate of respiration after eating.  GI: soft, NT, ND, (+)BS Psychiatric: appropriate but can tell a little anxious and irritable Neurological: Ox3  PRIOR EXAMS: Skin-skin appears either healed over with just color left and 3 blisters appear to have  scabbed over- 1 never popped, however is absorbing fluid and almost flat now-  No signs of spasticity Has T3 TTP- c/o feeling tight  Skin- posterior Cervical spine incision- steristrips intact Neuro:  Alert and oriented x 3, follows commands, CN 2-12 intact, no speech or language deficits noted, good insight and awareness RUE: 5/5 Deltoid, 5/5 Biceps, 5/5 Triceps, 5/5 Wrist Ext, 5/5  Grip LUE: 4/5 Deltoid, 5/5 Biceps, 5/5 Triceps, 5/5 Wrist Ext, 5/5 Grip RLE: HF 5/5, KE 5/5, ADF 5/5, APF 5/5 LLE: HF 5/5, KE 5/5,  ADF 5/5, APF 5/5 Sensory exam normal for light touch and pain in all 4 limbs. No limb ataxia or cerebellar signs. No abnormal tone appreciated.  No ankle clonus Musculoskeletal: L shoulder pain and crepitus with PROM, no significant L shoulder tenderness   .  Assessment/Plan: 1. Functional deficits which require 3+ hours per day of interdisciplinary therapy in a comprehensive inpatient rehab setting. Physiatrist is providing close team supervision and 24 hour management of active medical problems listed below. Physiatrist and rehab team continue to assess barriers to discharge/monitor patient progress toward functional and medical goals  Care Tool:  Bathing    Body parts bathed by patient: Right arm, Left lower leg, Face, Left arm, Chest, Abdomen, Front perineal area, Buttocks, Right upper leg, Left upper leg, Right lower leg         Bathing assist Assist Level: Supervision/Verbal cueing     Upper Body Dressing/Undressing Upper body dressing   What  is the patient wearing?: Pull over shirt    Upper body assist Assist Level: Supervision/Verbal cueing    Lower Body Dressing/Undressing Lower body dressing            Lower body assist Assist for lower body dressing: Contact Guard/Touching assist     Toileting Toileting    Toileting assist Assist for toileting: Supervision/Verbal cueing     Transfers Chair/bed transfer  Transfers assist     Chair/bed transfer assist level: Minimal Assistance - Patient > 75%     Locomotion Ambulation   Ambulation assist      Assist level: Minimal Assistance - Patient > 75% Assistive device: Walker-rolling Max distance: 84 ft   Walk 10 feet activity   Assist     Assist level: Minimal Assistance - Patient > 75% Assistive device: Walker-rolling   Walk 50 feet activity   Assist     Assist level: Minimal Assistance - Patient > 75% Assistive device: Walker-rolling    Walk 150 feet activity   Assist Walk 150 feet activity did not occur: Safety/medical concerns         Walk 10 feet on uneven surface  activity   Assist Walk 10 feet on uneven surfaces activity did not occur: Safety/medical concerns         Wheelchair     Assist Is the patient using a wheelchair?: Yes Type of Wheelchair: Manual    Wheelchair assist level: Supervision/Verbal cueing Max wheelchair distance: 150 ft    Wheelchair 50 feet with 2 turns activity    Assist        Assist Level: Supervision/Verbal cueing   Wheelchair 150 feet activity     Assist      Assist Level: Supervision/Verbal cueing   Blood pressure 137/78, pulse 66, temperature 97.8 F (36.6 C), resp. rate 17, height 5\' 10"  (1.778 m), weight 86.9 kg, SpO2 98%.  Medical Problem List and Plan: 1. 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              -Plan for radiation treatments week of 10/14             -patient may shower             -ELOS/Goals: 10-12, PT/OT Supervision              D/c date 10/26  Con't CIR PT and OT  Having radiation M/W/F this week  D/w Dr Cherly Hensen about pt's shingles dx- per ID. On contact/air precautions 2.  Antithrombotics: -DVT/anticoagulation:  Mechanical:  Antiembolism stockings, knee (TED hose) Bilateral lower extremities. Awaiting call back from NS regarding initiating pharmacologic DVT prophylaxis-- now on Lovenox 40mg  BID             -antiplatelet therapy: none   3. Pain Management: Tylenol as needed             -continue Flexeril 10 mg TID prn             -continue Duragesic patch 12/mcg/hr q 72 hours -continue gabapentin 300 mg TID             -continue Lidoderm              -continue oxycodone 10 mg q 4 hrs prn>> d/c'd              -continue ibuprofen 400 mg q 6 hrs prn -10/10-  will change Duragesic to 25 mcg q3 days as well as reduce Oxy to 5 mg q4 hours prn- since feels like patch more effective than Oxy.  -10/11- pain slightly better, but Oxy doesn't work- will change Oxy to Dilaudid 2-4 mg q4 hours prn and maintain Duragesic patch 25 mcg- pt happy with change.  -04/17/23 pain intensity worse today but worked hard yesterday; not needing pain meds too much before they're "due" so will leave meds in place for now; defer adjustments to weekday team if needed.  10/14- pain adequate control per pt- will con't current regimen for now 10/15- main pain is at level SCI pain- will increase gabapentin to 400 mg TID_ if tolerates, will go to 600 mg TID- cannot add Duloxetine since on high dose of Lexapro-  10/16- will increase gabapentin tomorrow to 600 mg TID 10/21- said still having pain, more than he expected at this point- in T3 dermatome- the tight grabbing pain- will increase gabapentin to 900 mg TID to help  4. Mood/Behavior/Sleep: LCSW to evaluate and provide emotional support             -History of GAD             -continue Lexapro 30 mg daily             -continue Seroquel 200 mg q HS             -continue trazodone 25 mg q HS             -continue Xanax 0.25 mg BID prn             -continue melatonin 3 mg q HS prn              10/14- sleeping well 10/15- somewhat anxious today since schedule of therapy and radiation/oncology appts overlap- d/w SW_ will look into it- Oncology takes priority.   10/16- doing better after yesterday  10/17- more anxious today- asked him to try Xanax that has Rx for prn 10/18- dealt with radiation day- which stresses him out- took Xanax yesterday to help with transfer to W-L 5. Neuropsych/cognition: This patient is capable of making decisions on his own behalf.   6. Skin/Wound Care: Routine skin care checks             -monitor incision>>keep the Steri-Strips dry during showers per Dr.  Wynetta Emery   7. Fluids/Electrolytes/Nutrition: Routine Is and Os and follow-up chemistries             -continue Mag-ox 400 mg BID             -continue B12 1000 mcg daily             -Hypokalemia/Hyponatremia- Recheck labs tomorrow -10/10- K+ 5.0- and Na 137- had been hypokalemic- so likely hemolysis- wil recheck Monday 10/15- K+ 5.0- top end of normal 10/21- K+ 4.6- doing better 8: Hypertension: controlled, monitor TID and prn   -04/17/23 BPs sort of variable but mostly ok, monitor for now.   10/16-19- BP doing well -controlled- cont' regimen   -04/24/23 BPs a little up this morning but overall fairly stable. Vitals:   04/21/23 1443 04/21/23 2042 04/22/23 0549 04/22/23 1532  BP: 115/81 135/69 134/83 114/64   04/22/23 2015 04/23/23 0603 04/23/23 1351 04/23/23 1956  BP: 135/78 137/87 135/80 (!) 148/83   04/24/23 0513 04/24/23 1241 04/24/23 2010 04/25/23 0308  BP: (!) 142/99 130/80 118/80 137/78    9: GERD: continue Protonix 40mg  BID  10: Current History of prostate cancer with mets to spine s/p decompressive laminectomy T3 and evacuation of epidural tumor Dr. Wynetta Emery 09/26             -continue Xtandi             -continue Decadron 4mg  q6h             -no brace needed; no twisting, arching, bending             -follow-up with Dr. Wynetta Emery, Dr. Mosetta Putt and Dr. Kathrynn Running   -10/11- starting radiation next week per notes-   10/14- starting radiation this week 10/15- starting today- d/w pharmacy- restarting Zometa and Lupron per Dr Cherly Hensen and pharmacy. 10/16 - started radiation yesterday 10/17- more radiation today- is concerned won't go well today- asked  if pt wanted to try Xanax for anxiety related to today-  10/18- used Xanax- went a little better 10/21- doing Radiation M,W/F this week- suggest using Xanax before he goes.  11: Anemia: multifactorial             -follow-up CBC   -10/10- Hb 9.9 a little better  10/21- Hb 9.5- overall stable 12. Constipation, cont miralax BID and senokotS 1 tab  BID -10/10- will try Sorbitol 15cc- since doesn't want to blow out- since LBM 2 days ago- and feels constipated. -10/11- didn't have BM- will order Sorbitol 30cc and pt to take prune juice at lunch- ordered at 4pm.  -04/24/23 LBM yesterday, cont regimen 13. Heart murmur:  -04/16/23 pt noted to have heart murmur, has been mentioned to him before, doubt need for emergent w/up although might need cardiology f/up for echo/eval.  14. Blisters on chest- Herpes Zoster/Shingles 10/16- not painful- don't look like shingles- looks like reaction to tape? Keep covered and monitor 10/17- I'm not sure if has shingles- is on precautions for it- will call ID since Infection prevention not able to decide.   - have paged Dr Daiva Eves-   Dr Daiva Eves said will get viral PCR so we are certain if shingles or not.   10/18- Oral Acyclovir changed to Valtrex by ID. 10/21- blisters look pretty scabbed over- will have PA also assess and see if can get off air/contact precautions.  15. Fatigue/malaise due to radiation?  10/21- will try Amantadine 100 mg daily and see if that helps his energy level- explained its not a GREAT chance of helping, but we can try.     I spent a total of 40   minutes on total care today- >50% coordination of care- due to   D/w pt prolonged about exhaustion, as well as stress over precautions- and radiation.    LOS: 12 days A FACE TO FACE EVALUATION WAS PERFORMED  Abigail Teall 04/25/2023, 8:36 AM

## 2023-04-26 ENCOUNTER — Other Ambulatory Visit (HOSPITAL_COMMUNITY): Payer: Self-pay

## 2023-04-26 ENCOUNTER — Other Ambulatory Visit: Payer: Self-pay

## 2023-04-26 ENCOUNTER — Ambulatory Visit: Payer: Medicare Other

## 2023-04-26 ENCOUNTER — Encounter: Payer: Self-pay | Admitting: Hematology

## 2023-04-26 DIAGNOSIS — S24101A Unspecified injury at T1 level of thoracic spinal cord, initial encounter: Secondary | ICD-10-CM

## 2023-04-26 DIAGNOSIS — C7940 Secondary malignant neoplasm of unspecified part of nervous system: Secondary | ICD-10-CM

## 2023-04-26 MED ORDER — ENZALUTAMIDE 80 MG PO TABS
160.0000 mg | ORAL_TABLET | Freq: Every day | ORAL | 0 refills | Status: DC
  Filled 2023-04-26: qty 60, 30d supply, fill #0

## 2023-04-26 NOTE — Plan of Care (Signed)
  Problem: SCI BOWEL ELIMINATION Goal: RH STG MANAGE BOWEL WITH ASSISTANCE Description: STG Manage Bowel with Mod I Assistance. Outcome: Progressing Goal: RH STG SCI MANAGE BOWEL WITH MEDICATION WITH ASSISTANCE Description: STG SCI Manage bowel with medication with mod I assistance. Outcome: Progressing Goal: RH STG MANAGE BOWEL W/EQUIPMENT W/ASSISTANCE Description: STG Manage Bowel With Equipment With Mod I Assistance Outcome: Progressing Goal: RH OTHER STG BOWEL ELIMINATION GOALS W/ASSIST Description: Other STG Bowel Elimination Goals With Mod I Assistance. Outcome: Progressing   Problem: SCI BLADDER ELIMINATION Goal: RH STG MANAGE BLADDER WITH ASSISTANCE Description: STG Manage Bladder With Mod I Assistance Outcome: Progressing Goal: RH STG MANAGE BLADDER WITH MEDICATION WITH ASSISTANCE Description: STG Manage Bladder With Medication With Mod I Assistance. Outcome: Progressing Goal: RH OTHER STG BLADDER ELIMINATION GOALS W/ASSIST Description: Other STG Bladder Elimination Goals With Assistance Outcome: Progressing   Problem: RH SKIN INTEGRITY Goal: RH STG SKIN FREE OF INFECTION/BREAKDOWN Outcome: Progressing Goal: RH STG MAINTAIN SKIN INTEGRITY WITH ASSISTANCE Description: STG Maintain Skin Integrity With Assistance. Outcome: Progressing   Problem: RH SAFETY Goal: RH STG ADHERE TO SAFETY PRECAUTIONS W/ASSISTANCE/DEVICE Description: STG Adhere to Safety Precautions With Assistance/Device. Outcome: Progressing   Problem: RH KNOWLEDGE DEFICIT SCI Goal: RH STG INCREASE KNOWLEDGE OF SELF CARE AFTER SCI Outcome: Progressing   Problem: Education: Goal: Ability to verbalize activity precautions or restrictions will improve Outcome: Progressing Goal: Knowledge of the prescribed therapeutic regimen will improve Outcome: Progressing Goal: Understanding of discharge needs will improve Outcome: Progressing   Problem: Activity: Goal: Ability to avoid complications of mobility  impairment will improve Outcome: Progressing Goal: Ability to tolerate increased activity will improve Outcome: Progressing Goal: Will remain free from falls Outcome: Progressing   Problem: Bowel/Gastric: Goal: Gastrointestinal status for postoperative course will improve Outcome: Progressing   Problem: Clinical Measurements: Goal: Ability to maintain clinical measurements within normal limits will improve Outcome: Progressing Goal: Postoperative complications will be avoided or minimized Outcome: Progressing Goal: Diagnostic test results will improve Outcome: Progressing   Problem: Pain Management: Goal: Pain level will decrease Outcome: Progressing   Problem: Skin Integrity: Goal: Will show signs of wound healing Outcome: Progressing   Problem: Health Behavior/Discharge Planning: Goal: Identification of resources available to assist in meeting health care needs will improve Outcome: Progressing   Problem: Bladder/Genitourinary: Goal: Urinary functional status for postoperative course will improve Outcome: Progressing

## 2023-04-26 NOTE — Consult Note (Signed)
Neuropsychological Consultation Comprehensive Inpatient Rehab   Patient:   Nathan Berg   DOB:   1956/07/01  MR Number:  [id number]  Location:  [facility] [address] [place] [address] Dept: [phone] Loc: [phone]           Date of Service:   04/26/2023  Start Time:   2 PM End Time:   3 PM  Provider/Observer:  Arley Phenix, Psy.D.       Clinical Neuropsychologist       Billing Code/Service: [phone]  Reason for Service:    Nathan Berg is a 67 year old male referred for neuropsychological consultation during his current admission onto the comprehensive inpatient rehabilitation unit.  I had attempted to see the patient last week but he was at [facility] receiving radiation for his metastatic prostate cancer.  This is a follow-up appointment with the patient.  Patient is on comprehensive inpatient rehabilitation unit due to spinal cord injury.  Patient has a history of prostate cancer and underwent outpatient workup at [facility] on 03/25/2023 with MRI T-spine showing concern for new metastatic disease involving thoracic spine with concern for cord compression.  Patient was referred to emergency department with MRI thoracic spine repeated on admission and evaluated by neurosurgery.  Patient was immediately started on steroids.  Patient was admitted to the hospital for further evaluation and treatment.  On 03/31/2023 he underwent decompressive laminectomy by Dr. Wynetta Emery.  Patient has a past medical history including depression, anxiety, GERD.  Patient has begun radiation treatment at [facility] during his inpatient rehab stay.  Patient acknowledges past history of depression and a lot of psychosocial stressors going on including challenges with one of his sons as well as recent injury of wife after 2 falls with facial injury.  Patient has been taken Wellbutrin, Lexapro and alprazolam in the past and  is taking Lexapro and alprazolam on unit.  Alprazolam was restarted recently because of stress and anxiety.  During today's clinical interview the patient reports that he feels like he is actually doing better than he would have expected given everything that is going on.  Patient has metastatic prostate cancer.  He has been treated for prostate cancer for some time and they have been watching him and he was not expecting it to have metastasized particularly to his spine.  Patient reports that he began having symptoms that initially were not felt to be due to metastatic disease but ultimately studies have identified this.  Patient reports that he improved some recently with regard to his neurological symptoms particularly with leg function.  Patient reports that he is responding well to his medications and continues to do well as far as his mood with Lexapro.  HPI for the current admission:    HPI: Nathan Berg is a 67 year old male with a history of prostate cancer who had undergone outpatient workup with [facility] on 03/25/2023 with MRI T-spine which showed concern for new metastatic disease involving thoracic spine with concern for cord compression. He was then referred to the ER. MRI thoracic spine repeated on admission and evaluated by neurosurgery, started on steroids. He was admitted to hospital for further evaluation and treatment. On 9/26 he underwent decompressive laminectomy by Dr. Wynetta Emery. Remains on Decadron. Has history of depression, anxiety, GERD. He had CT simulation done for radiation treatment in the future. He is tolerating his diet. I have messaged NS service in regards  to converting Decadron to oral, length of therapy and clearance for pharmacologic DVT prophylaxis. He requires multiple medications to control his pain.reports pain feels like a band around his chest. 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 He continues to have the patient requires inpatient medicine and  rehabilitation evaluations and services for ongoing functional deficits and decreased mobility.   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   Compression fracture of T3 vertebra (HCC) 04/13/2023   Anemia 04/13/2023   Primary hypertension 04/13/2023   Low serum vitamin B12 04/07/2023   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 his dress was Appropriate and he was Well Groomed and his manners were Appropriate to the situation.  his participation was indicative of Appropriate and Attentive behaviors.  There were physical disabilities noted.  he displayed an appropriate level of cooperation and motivation.    Interactions:    Active Appropriate and Attentive  Attention:   within normal limits and attention span and concentration were age appropriate  Memory:   within normal limits; recent and remote memory intact  Visuo-spatial:   not examined  Speech (Volume):  normal  Speech:   normal; normal  Thought Process:  Coherent and Relevant  Coherent and  Logical  Though Content:  WNL; not suicidal and not homicidal  Orientation:   person, place, time/date, and situation  Judgment:   Good  Planning:   Good  Affect:    Appropriate  Mood:    Dysphoric  Insight:   Good  Intelligence:   normal  Psychiatric History:  Patient with past psychiatric history of depression and anxiety and continues on his home medications.  Family Med/Psych History:  Family History  Problem Relation Age of Onset   Prostate cancer Father    Breast cancer Neg Hx    Colon cancer Neg Hx    Pancreatic cancer Neg Hx     Impression/DX:   Nathan Berg is a 67 year old male referred for neuropsychological consultation during his current admission onto the comprehensive inpatient rehabilitation unit.  I had attempted to see the patient last week but he was at [facility] receiving radiation for his metastatic prostate cancer.  This is a follow-up appointment with the patient.  Patient is on comprehensive inpatient rehabilitation unit due to spinal cord injury.  Patient has a history of prostate cancer and underwent outpatient workup at [facility] on 03/25/2023 with MRI T-spine showing concern for new metastatic disease involving thoracic spine  with concern for cord compression.  Patient was referred to emergency department with MRI thoracic spine repeated on admission and evaluated by neurosurgery.  Patient was immediately started on steroids.  Patient was admitted to the hospital for further evaluation and treatment.  On 03/31/2023 he underwent decompressive laminectomy by Dr. Wynetta Emery.  Patient has a past medical history including depression, anxiety, GERD.  Patient has begun radiation treatment at [facility] during his inpatient rehab stay.  Patient acknowledges past history of depression and a lot of psychosocial stressors going on including challenges with one of his sons as well as recent injury of wife after 2 falls with facial injury.  Patient has been taken  Wellbutrin, Lexapro and alprazolam in the past and is taking Lexapro and alprazolam on unit.  Alprazolam was restarted recently because of stress and anxiety.  During today's clinical interview the patient reports that he feels like he is actually doing better than he would have expected given everything that is going on.  Patient has metastatic prostate cancer.  He has been treated for prostate cancer for some time and they have been watching him and he was not expecting it to have metastasized particularly to his spine.  Patient reports that he began having symptoms that initially were not felt to be due to metastatic disease but ultimately studies have identified this.  Patient reports that he improved some recently with regard to his neurological symptoms particularly with leg function.  Patient reports that he is responding well to his medications and continues to do well as far as his mood with Lexapro.  Disposition/Plan:  Today we worked on coping and adjustment issues and established review of mood changes and talked about medication strategies and his coping regarding his metastatic prostate cancer and current radiation treatments along with residual neurological changes post spinal cord compression.          Electronically Signed   _______________________ Arley Phenix, Psy.D. Clinical Neuropsychologist

## 2023-04-26 NOTE — Progress Notes (Signed)
Refill received today. Informed Nathan Berg and coordinated with Enid Derry that it will be sent to [facility] via courier tomorrow.

## 2023-04-26 NOTE — Progress Notes (Signed)
PROGRESS NOTE   Subjective/Complaints:   Pt admits still more irritable, but feels bad because we need dress up to come in to see him.   Xanax - made things easier for radiation- and snoozed in/out so went better.   Feels like energy really low- but thinks due to low testosterone from meds for CA. Wondering when/if can come off precautions.    ROS: See HPI   Pt denies SOB, abd pain, CP, N/V/C/D, and vision changes   Pain limiting (+) pain his At level T3 SCI pain  Objective:   No results found. Recent Labs    04/25/23 0541  WBC 4.5  HGB 9.5*  HCT 30.1*  PLT 351     Recent Labs    04/25/23 0541  NA 137  K 4.6  CL 99  CO2 29  GLUCOSE 108*  BUN 14  CREATININE 0.65  CALCIUM 8.2*      Intake/Output Summary (Last 24 hours) at 04/26/2023 0832 Last data filed at 04/26/2023 0730 Gross per 24 hour  Intake 714 ml  Output 500 ml  Net 214 ml         Physical Exam: Vital Signs Blood pressure (!) 151/90, pulse 62, temperature 98 F (36.7 C), resp. rate 18, height 5\' 10"  (1.778 m), weight 86.9 kg, SpO2 100%.      General: awake, alert, appropriate, sitting up in bedside chair; NAD HENT: conjugate gaze; oropharynx moist CV: regular rate and rhythm; no JVD Pulmonary: CTA B/L; no W/R/R- good air movement GI: soft, NT, ND, (+)BS- normoactive Psychiatric: appropriate- less anxious, less frustrated Neurological: Ox3 Skin- 2 of 3 blisters are scabbed over- reddened areas now just color changes- however 1 blister has fluid in it stil- more flat  PRIOR EXAMS: Skin-skin appears either healed over with just color left and 3 blisters appear to have  scabbed over- 1 never popped, however is absorbing fluid and almost flat now-  No signs of spasticity Has T3 TTP- c/o feeling tight  Skin- posterior Cervical spine incision- steristrips intact Neuro:  Alert and oriented x 3, follows commands, CN 2-12 intact,  no speech or language deficits noted, good insight and awareness RUE: 5/5 Deltoid, 5/5 Biceps, 5/5 Triceps, 5/5 Wrist Ext, 5/5 Grip LUE: 4/5 Deltoid, 5/5 Biceps, 5/5 Triceps, 5/5 Wrist Ext, 5/5 Grip RLE: HF 5/5, KE 5/5, ADF 5/5, APF 5/5 LLE: HF 5/5, KE 5/5,  ADF 5/5, APF 5/5 Sensory exam normal for light touch and pain in all 4 limbs. No limb ataxia or cerebellar signs. No abnormal tone appreciated.  No ankle clonus Musculoskeletal: L shoulder pain and crepitus with PROM, no significant L shoulder tenderness   .  Assessment/Plan: 1. Functional deficits which require 3+ hours per day of interdisciplinary therapy in a comprehensive inpatient rehab setting. Physiatrist is providing close team supervision and 24 hour management of active medical problems listed below. Physiatrist and rehab team continue to assess barriers to discharge/monitor patient progress toward functional and medical goals  Care Tool:  Bathing    Body parts bathed by patient: Right arm, Left lower leg, Face, Left arm, Chest, Abdomen, Front perineal area, Buttocks, Right upper leg, Left upper leg, Right  lower leg         Bathing assist Assist Level: Supervision/Verbal cueing     Upper Body Dressing/Undressing Upper body dressing   What is the patient wearing?: Pull over shirt    Upper body assist Assist Level: Independent    Lower Body Dressing/Undressing Lower body dressing      What is the patient wearing?: Underwear/pull up, Pants     Lower body assist Assist for lower body dressing: Supervision/Verbal cueing     Toileting Toileting    Toileting assist Assist for toileting: Independent with assistive device     Transfers Chair/bed transfer  Transfers assist     Chair/bed transfer assist level: Supervision/Verbal cueing     Locomotion Ambulation   Ambulation assist      Assist level: Minimal Assistance - Patient > 75% Assistive device: Walker-rolling Max distance: 84 ft   Walk 10  feet activity   Assist     Assist level: Minimal Assistance - Patient > 75% Assistive device: Walker-rolling   Walk 50 feet activity   Assist    Assist level: Minimal Assistance - Patient > 75% Assistive device: Walker-rolling    Walk 150 feet activity   Assist Walk 150 feet activity did not occur: Safety/medical concerns         Walk 10 feet on uneven surface  activity   Assist Walk 10 feet on uneven surfaces activity did not occur: Safety/medical concerns         Wheelchair     Assist Is the patient using a wheelchair?: Yes Type of Wheelchair: Manual    Wheelchair assist level: Supervision/Verbal cueing Max wheelchair distance: 150 ft    Wheelchair 50 feet with 2 turns activity    Assist        Assist Level: Supervision/Verbal cueing   Wheelchair 150 feet activity     Assist      Assist Level: Supervision/Verbal cueing   Blood pressure (!) 151/90, pulse 62, temperature 98 F (36.7 C), resp. rate 18, height 5\' 10"  (1.778 m), weight 86.9 kg, SpO2 100%.  Medical Problem List and Plan: 1. 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              -Plan for radiation treatments week of 10/14             -patient may shower             -ELOS/Goals: 10-12, PT/OT Supervision              D/c date 10/26  Con't CIR PT and OT Team conference today to f/u on progress  Having radiation M/W/F this week  D/w Dr Cherly Hensen about pt's shingles dx- per ID. On contact/air precautions 2.  Antithrombotics: -DVT/anticoagulation:  Mechanical:  Antiembolism stockings, knee (TED hose) Bilateral lower extremities. Awaiting call back from NS regarding initiating pharmacologic DVT prophylaxis-- now on Lovenox 40mg  BID             -antiplatelet therapy: none   3. Pain Management: Tylenol as needed             -continue Flexeril 10 mg  TID prn             -continue Duragesic patch 12/mcg/hr q 72 hours -continue gabapentin 300 mg TID             -continue Lidoderm              -  continue oxycodone 10 mg q 4 hrs prn>> d/c'd             -continue ibuprofen 400 mg q 6 hrs prn -10/10- will change Duragesic to 25 mcg q3 days as well as reduce Oxy to 5 mg q4 hours prn- since feels like patch more effective than Oxy.  -10/11- pain slightly better, but Oxy doesn't work- will change Oxy to Dilaudid 2-4 mg q4 hours prn and maintain Duragesic patch 25 mcg- pt happy with change.  -04/17/23 pain intensity worse today but worked hard yesterday; not needing pain meds too much before they're "due" so will leave meds in place for now; defer adjustments to weekday team if needed.  10/14- pain adequate control per pt- will con't current regimen for now 10/15- main pain is at level SCI pain- will increase gabapentin to 400 mg TID_ if tolerates, will go to 600 mg TID- cannot add Duloxetine since on high dose of Lexapro-  10/16- will increase gabapentin tomorrow to 600 mg TID 10/21- said still having pain, more than he expected at this point- in T3 dermatome- the tight grabbing pain- will increase gabapentin to 900 mg TID to help 10/22- no improvement in pain so far- will monitor since increased gabapentin  4. Mood/Behavior/Sleep: LCSW to evaluate and provide emotional support             -History of GAD             -continue Lexapro 30 mg daily             -continue Seroquel 200 mg q HS             -continue trazodone 25 mg q HS             -continue Xanax 0.25 mg BID prn             -continue melatonin 3 mg q HS prn              10/14- sleeping well 10/15- somewhat anxious today since schedule of therapy and radiation/oncology appts overlap- d/w SW_ will look into it- Oncology takes priority.   10/16- doing better after yesterday  10/17- more anxious today- asked him to try Xanax that has Rx for prn 10/18- dealt with radiation day- which  stresses him out- took Xanax yesterday to help with transfer to W-L 10/22- scheduled Xanax -went better 5. Neuropsych/cognition: This patient is capable of making decisions on his own behalf.   6. Skin/Wound Care: Routine skin care checks             -monitor incision>>keep the Steri-Strips dry during showers per Dr. Wynetta Emery   7. Fluids/Electrolytes/Nutrition: Routine Is and Os and follow-up chemistries             -continue Mag-ox 400 mg BID             -continue B12 1000 mcg daily             -Hypokalemia/Hyponatremia- Recheck labs tomorrow -10/10- K+ 5.0- and Na 137- had been hypokalemic- so likely hemolysis- wil recheck Monday 10/15- K+ 5.0- top end of normal 10/21- K+ 4.6- doing better 8: Hypertension: controlled, monitor TID and prn   -04/17/23 BPs sort of variable but mostly ok, monitor for now.   10/16-19- BP doing well -controlled- cont' regimen   -04/24/23 BPs a little up this morning but overall fairly stable.  10/22- BP somewhat elevated this AM, but not the rest of day-  will monitor Vitals:   04/22/23 2015 04/23/23 0603 04/23/23 1351 04/23/23 1956  BP: 135/78 137/87 135/80 (!) 148/83   04/24/23 0513 04/24/23 1241 04/24/23 2010 04/25/23 0308  BP: (!) 142/99 130/80 118/80 137/78   04/25/23 1412 04/25/23 1705 04/25/23 2001 04/26/23 0445  BP: (!) 150/87 136/76 135/81 (!) 151/90    9: GERD: continue Protonix 40mg  BID   10: Current History of prostate cancer with mets to spine s/p decompressive laminectomy T3 and evacuation of epidural tumor Dr. Wynetta Emery 09/26             -continue Xtandi             -continue Decadron 4mg  q6h             -no brace needed; no twisting, arching, bending             -follow-up with Dr. Wynetta Emery, Dr. Mosetta Putt and Dr. Kathrynn Running   -10/11- starting radiation next week per notes-   10/14- starting radiation this week 10/15- starting today- d/w pharmacy- restarting Zometa and Lupron per Dr Cherly Hensen and pharmacy. 10/16 - started radiation yesterday 10/17- more  radiation today- is concerned won't go well today- asked  if pt wanted to try Xanax for anxiety related to today-  10/18- used Xanax- went a little better 10/21- doing Radiation M,W/F this week- suggest using Xanax before he goes.  10/22- went better with Xanax- snoozed in and out after radiation- so went better per pt 11: Anemia: multifactorial             -follow-up CBC   -10/10- Hb 9.9 a little better  10/21- Hb 9.5- overall stable 12. Constipation, cont miralax BID and senokotS 1 tab BID -10/10- will try Sorbitol 15cc- since doesn't want to blow out- since LBM 2 days ago- and feels constipated. -10/11- didn't have BM- will order Sorbitol 30cc and pt to take prune juice at lunch- ordered at 4pm.  -04/24/23 LBM yesterday, cont regimen 13. Heart murmur:  -04/16/23 pt noted to have heart murmur, has been mentioned to him before, doubt need for emergent w/up although might need cardiology f/up for echo/eval.  14. Blisters on chest- Herpes Zoster/Shingles 10/16- not painful- don't look like shingles- looks like reaction to tape? Keep covered and monitor 10/17- I'm not sure if has shingles- is on precautions for it- will call ID since Infection prevention not able to decide.   - have paged Dr Daiva Eves-   Dr Daiva Eves said will get viral PCR so we are certain if shingles or not.   10/18- Oral Acyclovir changed to Valtrex by ID. 10/22- 2 of 3 blisters scabbed over- 1 still has a little fluid in it- and has scabbing on top, but not completely gone- will place dressing per infection control and monitor 15. Fatigue/malaise due to radiation?  10/21- will try Amantadine 100 mg daily and see if that helps his energy level- explained its not a GREAT chance of helping, but we can try.   10/22- feeling worse- thinks it's due to Lack of testosterone due to meds for Cancer- which is reasonable-  will con't Amantadine for now and see if helps longer term    I spent a total of 38   minutes on total care today-  >50% coordination of care- due to  D/w PA about pt's shingles/Zoster as well as team conference and d/w nursing as well about pt concerns.   LOS: 13 days A FACE TO FACE EVALUATION WAS PERFORMED  Nathan Berg 04/26/2023, 8:32 AM

## 2023-04-26 NOTE — Progress Notes (Signed)
Patient ID: Nathan Berg, male   DOB: [birth year], 67 y.o.   MRN: [id number]  SW met with pt in room to provide updates from team conference, and discuss discharge recs. Pt states he can get a RW and TTB. SW will order w/c and 3in1 BSC.   Cecile Sheerer, MSW, LCSWA Office: [phone] Cell: [phone] Fax: [phone]

## 2023-04-26 NOTE — Progress Notes (Signed)
Occupational Therapy Session Note  Patient Details  Name: JAIVEN SANDUSKY MRN: [id number] Date of Birth: Oct 01, 1955  Today's Date: 04/26/2023 OT Individual Time: 0930-1040 OT Individual Time Calculation (min): 70 min    Short Term Goals: Week 1:  OT Short Term Goal 1 (Week 1): STG=LTG d/t ELOS  Skilled Therapeutic Interventions/Progress Updates:    Pt resting in recliner upon arrival. Pt declined shower this morning, having just returned from PT and "feeling exhausted." Pt agreeable to therapy in room. Functional amb with RW in room with CGA/supervision. Sit<>stand and squats without BUE support or use of AD with close supervision. Squats 3x10. Sit<>stand X 5. BUE therex with green theraband-punches and curls 3x10. Extensive rest breaks between each set. Pt with some SOB after exercises and amb. Pt remained seated in recliner with all needs wihtin reach. Seat alarm activated.   Therapy Documentation Precautions:  Precautions Precautions: Fall, Back Precaution Comments: no brace needed; recalled 3/3 precautions without cue Restrictions Weight Bearing Restrictions: No   Pain: Pt reports 5/10 generalized discomfort; rest, RN aware  Therapy/Group: Individual Therapy  Rich Brave 04/26/2023, 10:44 AM

## 2023-04-26 NOTE — Patient Care Conference (Signed)
Inpatient RehabilitationTeam Conference and Plan of Care Update Date: 04/26/2023   Time:10:59 AM    Patient Name: Nathan Berg      Medical Record Number: [id number]  Date of Birth: 1955/08/09 Sex: Male         Room/Bed: 4M09C/4M09C-01 Payor Info: Payor: MEDICARE / Plan: MEDICARE PART A AND B / Product Type: *No Product type* /    Admit Date/Time:  04/13/2023  6:20 PM  Primary Diagnosis:  Zoster  Hospital Problems: Principal Problem:   Zoster Active Problems:   Prostate cancer (HCC)   Compression fracture of T3 vertebra (HCC)   Anemia   Primary hypertension   Spinal stenosis, thoracic   Adjustment disorder    Expected Discharge Date: Expected Discharge Date: 04/30/23  Team Members Present: Physician leading conference: Dr. Genice Rouge Social Worker Present: Cecile Sheerer, LCSWA Nurse Present: Vedia Pereyra, RN PT Present: Bernie Covey, PT OT Present: Roney Mans, OT PPS Coordinator present : Fae Pippin, SLP     Current Status/Progress Goal Weekly Team Focus  Bowel/Bladder   Patient is continent of bladder and bowel, LBM 04/25/23   Continue to maintain continence while on IP Rehab   Assist with toileting needs as needed    Swallow/Nutrition/ Hydration               ADL's   supervision overall, fatigues quickly   Mod I overall   endurance, education, safety    Mobility   CGA-supervision gait, improving gait mechanics, limited by pain and endurance   mod I transfers, supervision gait  endurance, stair navigation    Communication                Safety/Cognition/ Behavioral Observations               Pain   (P) Pain   (P) Pain goal 3/10   (P) Continue to assess pain QS and PRN, document f/u assessment per standard protocol    Skin   (P) S/P posterior cervical incision  healing well and intact with steri-strips in place, small amt of drainage to mid back with dressing in place .Patient has active shingle to anterior left chest,  radiaitng to left arm pit, area with rednesss and dryness   (P) Continue with skin integrity and progression of healing , prevention of infection  (P) Assess skin QS and prn noting healing      Discharge Planning:  D/c plan remains to be home with his wife who is able to provide limited support 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 There are limited natural supports to assist with transportation to appointments. [place]. Pt wife reports pt needs to be able to walk upon returning home. Pt has radiaiton treatments on !0/15, 17,21,23,and 25. PET scan will be rescheduled per oncology physician. SW Will confirm there are no barriers to discharge.   Team Discussion: Spinal stenosis.  Zoster.  Air/contact precautions. Has one vesicle with fluid.  Anxiety during radiation treatments at [facility] as well as in general. Continent of B/B.  Takes PRN and scheduled medications for pain control. Back incision with approximated edges without drainage. Tolerating regular diet. Fatigued with therapy and limited endurance.  Patient on target to meet rehab goals: yes, progressing towards goals with discharge date of 04/30/23  *See Care Plan and progress notes for long and short-term goals.   Revisions to Treatment  Plan:  Valtrex added.  Cover shingles with Tegaderm dressing and monitor. Xanax added. Monitor labs/VS Teaching Needs: Medications, safety, self care, gait/transfer training, skin care, etc.   Current Barriers to Discharge: Decreased caregiver support, Wound care, and Lack of/limited family support  Possible Resolutions to Barriers: Family education Outside assistance with resources Independent with wound care Order recommended DME     Medical Summary Current Status: LBM 10/22- takes prn for pain- Dilaudid and Durgesic- shingles- 1 still has fluid in it    Barriers to Discharge Comments: at  supervision level- more ftgiue- not wlking as far- limited by fatigue, and lethrgy nd frustration, as well as anxiety Possible Resolutions to [employer] Focus: added amantadine for possible resolution of fatigue, to help; added Xanax for radiation; and incresed gabapentin for nerve pain- d/c 10/26   Continued Need for Acute Rehabilitation Level of Care: The patient requires daily medical management by a physician with specialized training in physical medicine and rehabilitation for the following reasons: Direction of a multidisciplinary physical rehabilitation program to maximize functional independence : Yes Medical management of patient stability for increased activity during participation in an intensive rehabilitation regime.: Yes Analysis of laboratory values and/or radiology reports with any subsequent need for medication adjustment and/or medical intervention. : Yes   I attest that I was present, lead the team conference, and concur with the assessment and plan of the team.   Jearld Adjutant 04/26/2023, 2:48 PM

## 2023-04-26 NOTE — Progress Notes (Incomplete)
Physical Therapy Session Note  Patient Details  Name: Nathan Berg MRN: [id number] Date of Birth: 05-24-1956  {CHL IP REHAB PT TIME CALCULATION:304800500}  Short Term Goals: {PYK:9983382}  Skilled Therapeutic Interventions/Progress Updates:      Therapy Documentation Precautions:  Precautions Precautions: Fall, Back Precaution Comments: no brace needed; recalled 3/3 precautions without cue Restrictions Weight Bearing Restrictions: No General:   Vital Signs: Therapy Vitals Temp: 98 F (36.7 C) Pulse Rate: 62 Resp: 18 BP: (!) 151/90 Patient Position (if appropriate): Lying Oxygen Therapy SpO2: 100 % O2 Device: Room Air Pain: Pain Assessment Pain Scale: 0-10 Pain Score: 7  Mobility:   Locomotion :    Trunk/Postural Assessment :    Balance:   Exercises:   Other Treatments:      Therapy/Group: {Therapy/Group:[id number]}  Loel Dubonnet 04/26/2023, 8:01 AM

## 2023-04-27 ENCOUNTER — Ambulatory Visit: Payer: Medicare Other | Admitting: Radiation Oncology

## 2023-04-27 ENCOUNTER — Ambulatory Visit: Payer: Medicare Other

## 2023-04-27 DIAGNOSIS — C7951 Secondary malignant neoplasm of bone: Secondary | ICD-10-CM

## 2023-04-27 NOTE — Progress Notes (Addendum)
Patient ID: Nathan Berg, male   DOB: [birth year], 68 y.o.   MRN: [id number]  SW ordered w/c and 3in1 BSC with [employer] via parachute.   SW sent HHPT/OT/aide/SN referral to [facility] and waiting on follow-up.  *referral accepted.  *SW received updates from pt assigned RN that cancer called to cancel radiation treatment today as the machine is down. Unsure on when the machine will be running again, and will call when able to reschedule.   SW updated pt and informed medical team.   Will monitor for changes with schedule to see when new treatment dates are added as Cancer Center unaware on when machine will be working, and when new appointments will be added.   Updates from SW -[facility]. Tx will remain on Friday as scheduled, and final tx on Monday (10/28) for 12:45pm. SW arranged transportation with [facility] for p/u on Monday at 12:15pm.   SW updated pt and medical team on changes. D/c date now 10/29. Pt states he will likely leave on Saturday. He will confirm. Pt informed on HHA and DME ordered. SW added HHSW as well.   Cecile Sheerer, MSW, LCSWA Office: [phone] Cell: [phone] Fax: [phone]

## 2023-04-27 NOTE — Progress Notes (Signed)
PROGRESS NOTE   Subjective/Complaints:   Pt reports no major changes Going to Radiation today.  Asking when blister will be healed, so can come off precautions.   ROS: See HPI   Pt denies SOB, abd pain, CP, N/V/C/D, and vision changes    Pain limiting (+) pain his At level T3 SCI pain  Objective:   No results found. Recent Labs    04/25/23 0541  WBC 4.5  HGB 9.5*  HCT 30.1*  PLT 351     Recent Labs    04/25/23 0541  NA 137  K 4.6  CL 99  CO2 29  GLUCOSE 108*  BUN 14  CREATININE 0.65  CALCIUM 8.2*      Intake/Output Summary (Last 24 hours) at 04/27/2023 0939 Last data filed at 04/27/2023 0804 Gross per 24 hour  Intake 835 ml  Output 575 ml  Net 260 ml         Physical Exam: Vital Signs Blood pressure 123/78, pulse 64, temperature 97.8 F (36.6 C), temperature source Oral, resp. rate 17, height 5\' 10"  (1.778 m), weight 86.9 kg, SpO2 97%.       General: awake, alert, appropriate, sititng up in bedside chair; NAD HENT: conjugate gaze; oropharynx moist CV: regular rate and rhythm; no JVD Pulmonary: CTA B/L; no W/R/R- good air movement GI: soft, NT, ND, (+)BS Psychiatric: appropriate- but flat, depressed Neurological: Ox3  Skin- 2 of 3 blisters are scabbed over- reddened areas now just color changes- however 1 blister has fluid in it stil- more flat  Has scabbing on top of it- covered with tegaderm PRIOR EXAMS: Skin-skin appears either healed over with just color left and 3 blisters appear to have  scabbed over- 1 never popped, however is absorbing fluid and almost flat now-  No signs of spasticity Has T3 TTP- c/o feeling tight  Skin- posterior Cervical spine incision- steristrips intact Neuro:  Alert and oriented x 3, follows commands, CN 2-12 intact, no speech or language deficits noted, good insight and awareness RUE: 5/5 Deltoid, 5/5 Biceps, 5/5 Triceps, 5/5 Wrist Ext, 5/5  Grip LUE: 4/5 Deltoid, 5/5 Biceps, 5/5 Triceps, 5/5 Wrist Ext, 5/5 Grip RLE: HF 5/5, KE 5/5, ADF 5/5, APF 5/5 LLE: HF 5/5, KE 5/5,  ADF 5/5, APF 5/5 Sensory exam normal for light touch and pain in all 4 limbs. No limb ataxia or cerebellar signs. No abnormal tone appreciated.  No ankle clonus Musculoskeletal: L shoulder pain and crepitus with PROM, no significant L shoulder tenderness   .  Assessment/Plan: 1. Functional deficits which require 3+ hours per day of interdisciplinary therapy in a comprehensive inpatient rehab setting. Physiatrist is providing close team supervision and 24 hour management of active medical problems listed below. Physiatrist and rehab team continue to assess barriers to discharge/monitor patient progress toward functional and medical goals  Care Tool:  Bathing    Body parts bathed by patient: Right arm, Left lower leg, Face, Left arm, Chest, Abdomen, Front perineal area, Buttocks, Right upper leg, Left upper leg, Right lower leg         Bathing assist Assist Level: Supervision/Verbal cueing     Upper Body Dressing/Undressing Upper body  dressing   What is the patient wearing?: Pull over shirt    Upper body assist Assist Level: Independent    Lower Body Dressing/Undressing Lower body dressing      What is the patient wearing?: Underwear/pull up, Pants     Lower body assist Assist for lower body dressing: Supervision/Verbal cueing     Toileting Toileting    Toileting assist Assist for toileting: Independent with assistive device     Transfers Chair/bed transfer  Transfers assist     Chair/bed transfer assist level: Supervision/Verbal cueing     Locomotion Ambulation   Ambulation assist      Assist level: Minimal Assistance - Patient > 75% Assistive device: Walker-rolling Max distance: 84 ft   Walk 10 feet activity   Assist     Assist level: Minimal Assistance - Patient > 75% Assistive device: Walker-rolling   Walk 50  feet activity   Assist    Assist level: Minimal Assistance - Patient > 75% Assistive device: Walker-rolling    Walk 150 feet activity   Assist Walk 150 feet activity did not occur: Safety/medical concerns         Walk 10 feet on uneven surface  activity   Assist Walk 10 feet on uneven surfaces activity did not occur: Safety/medical concerns         Wheelchair     Assist Is the patient using a wheelchair?: Yes Type of Wheelchair: Manual    Wheelchair assist level: Supervision/Verbal cueing Max wheelchair distance: 150 ft    Wheelchair 50 feet with 2 turns activity    Assist        Assist Level: Supervision/Verbal cueing   Wheelchair 150 feet activity     Assist      Assist Level: Supervision/Verbal cueing   Blood pressure 123/78, pulse 64, temperature 97.8 F (36.6 C), temperature source Oral, resp. rate 17, height 5\' 10"  (1.778 m), weight 86.9 kg, SpO2 97%.  Medical Problem List and Plan: 1. 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              -Plan for radiation treatments week of 10/14             -patient may shower             -ELOS/Goals: 10-12, PT/OT Supervision              D/c date 10/26  Con't CIR PT and OT  Having radiation M/W/F this week  D/w Dr Cherly Hensen about pt's shingles dx- per ID. On contact/air precautions 2.  Antithrombotics: -DVT/anticoagulation:  Mechanical:  Antiembolism stockings, knee (TED hose) Bilateral lower extremities. Awaiting call back from NS regarding initiating pharmacologic DVT prophylaxis-- now on Lovenox 40mg  BID             -antiplatelet therapy: none   3. Pain Management: Tylenol as needed             -continue Flexeril 10 mg TID prn             -continue Duragesic patch 12/mcg/hr q 72 hours -continue gabapentin 300 mg TID             -continue Lidoderm               -continue oxycodone 10 mg q 4 hrs prn>> d/c'd             -  continue ibuprofen 400 mg q 6 hrs prn -10/10- will change Duragesic to 25 mcg q3 days as well as reduce Oxy to 5 mg q4 hours prn- since feels like patch more effective than Oxy.  -10/11- pain slightly better, but Oxy doesn't work- will change Oxy to Dilaudid 2-4 mg q4 hours prn and maintain Duragesic patch 25 mcg- pt happy with change.  -04/17/23 pain intensity worse today but worked hard yesterday; not needing pain meds too much before they're "due" so will leave meds in place for now; defer adjustments to weekday team if needed.  10/14- pain adequate control per pt- will con't current regimen for now 10/15- main pain is at level SCI pain- will increase gabapentin to 400 mg TID_ if tolerates, will go to 600 mg TID- cannot add Duloxetine since on high dose of Lexapro-  10/16- will increase gabapentin tomorrow to 600 mg TID 10/21- said still having pain, more than he expected at this point- in T3 dermatome- the tight grabbing pain- will increase gabapentin to 900 mg TID to help 10/22- no improvement in pain so far- will monitor since increased gabapentin  10/23- needs to f/u with Oncology for pain meds once leaves hospital  4. Mood/Behavior/Sleep: LCSW to evaluate and provide emotional support             -History of GAD             -continue Lexapro 30 mg daily             -continue Seroquel 200 mg q HS             -continue trazodone 25 mg q HS             -continue Xanax 0.25 mg BID prn             -continue melatonin 3 mg q HS prn              10/14- sleeping well 10/15- somewhat anxious today since schedule of therapy and radiation/oncology appts overlap- d/w SW_ will look into it- Oncology takes priority.   10/16- doing better after yesterday  10/17- more anxious today- asked him to try Xanax that has Rx for prn 10/18- dealt with radiation day- which stresses him out- took Xanax yesterday to help with transfer to [facility] 10/22-  scheduled Xanax -went better 5. Neuropsych/cognition: This patient is capable of making decisions on his own behalf.   6. Skin/Wound Care: Routine skin care checks             -monitor incision>>keep the Steri-Strips dry during showers per Dr. Wynetta Emery   7. Fluids/Electrolytes/Nutrition: Routine Is and Os and follow-up chemistries             -continue Mag-ox 400 mg BID             -continue B12 1000 mcg daily             -Hypokalemia/Hyponatremia- Recheck labs tomorrow -10/10- K+ 5.0- and Na 137- had been hypokalemic- so likely hemolysis- wil recheck Monday 10/15- K+ 5.0- top end of normal 10/21- K+ 4.6- doing better 8: Hypertension: controlled, monitor TID and prn   -04/17/23 BPs sort of variable but mostly ok, monitor for now.   10/16-19- BP doing well -controlled- cont' regimen   -04/24/23 BPs a little up this morning but overall fairly stable.  10/22- BP somewhat elevated this AM, but not the rest of day- will monitor  10/23- better today- con't regimen  Vitals:   04/23/23 1956 04/24/23 0513 04/24/23 1241 04/24/23 2010  BP: (!) 148/83 (!) 142/99 130/80 118/80   04/25/23 0308 04/25/23 1412 04/25/23 1705 04/25/23 2001  BP: 137/78 (!) 150/87 136/76 135/81   04/26/23 0445 04/26/23 1356 04/26/23 1932 04/27/23 0525  BP: (!) 151/90 122/76 129/81 123/78    9: GERD: continue Protonix 40mg  BID   10: Current History of prostate cancer with mets to spine s/p decompressive laminectomy T3 and evacuation of epidural tumor Dr. Wynetta Emery 09/26             -continue Xtandi             -continue Decadron 4mg  q6h             -no brace needed; no twisting, arching, bending             -follow-up with Dr. Wynetta Emery, Dr. Mosetta Putt and Dr. Kathrynn Running   -10/11- starting radiation next week per notes-   10/14- starting radiation this week 10/15- starting today- d/w pharmacy- restarting Zometa and Lupron per Dr Cherly Hensen and pharmacy. 10/16 - started radiation yesterday 10/17- more radiation today- is concerned won't go well  today- asked  if pt wanted to try Xanax for anxiety related to today-  10/18- used Xanax- went a little better 10/21- doing Radiation M,W/F this week- suggest using Xanax before he goes.  10/22- went better with Xanax- snoozed in and out after radiation- so went better per pt 11: Anemia: multifactorial             -follow-up CBC   -10/10- Hb 9.9 a little better  10/21- Hb 9.5- overall stable 12. Constipation, cont miralax BID and senokotS 1 tab BID -10/10- will try Sorbitol 15cc- since doesn't want to blow out- since LBM 2 days ago- and feels constipated. -10/11- didn't have BM- will order Sorbitol 30cc and pt to take prune juice at lunch- ordered at 4pm.  -04/24/23 LBM yesterday, cont regimen 13. Heart murmur:  -04/16/23 pt noted to have heart murmur, has been mentioned to him before, doubt need for emergent w/up although might need cardiology f/up for echo/eval.  14. Blisters on chest- Herpes Zoster/Shingles 10/16- not painful- don't look like shingles- looks like reaction to tape? Keep covered and monitor 10/17- I'm not sure if has shingles- is on precautions for it- will call ID since Infection prevention not able to decide.   - have paged Dr Daiva Eves-   Dr Daiva Eves said will get viral PCR so we are certain if shingles or not.   10/18- Oral Acyclovir changed to Valtrex by ID. 10/22- 2 of 3 blisters scabbed over- 1 still has a little fluid in it- and has scabbing on top, but not completely gone- will place dressing per infection control and monitor 15. Fatigue/malaise due to radiation?  10/21- will try Amantadine 100 mg daily and see if that helps his energy level- explained its not a GREAT chance of helping, but we can try.   10/22- feeling worse- thinks it's due to Lack of testosterone due to meds for Cancer- which is reasonable-  will con't Amantadine for now and see if helps longer term      LOS: 14 days A FACE TO FACE EVALUATION WAS PERFORMED  Niva Murren 04/27/2023, 9:39 AM

## 2023-04-27 NOTE — Progress Notes (Signed)
Occupational Therapy Session Note  Patient Details  Name: Nathan Berg MRN: [id number] Date of Birth: 15-Oct-1955  Today's Date: 04/27/2023 OT Individual Time: 0815-0900 OT Individual Time Calculation (min): 45 min  and Today's Date: 04/27/2023 OT Missed Time: 30 Minutes Missed Time Reason: Patient fatigue   Short Term Goals: Week 1:  OT Short Term Goal 1 (Week 1): STG=LTG d/t ELOS  Skilled Therapeutic Interventions/Progress Updates:    Pt resting in recliner upon arrival. Pt reports fatigue and lack of sleep during this night. Pt agreeable to shower. Amb with RW to bathroom and transfer to TTB with supervision. Pt reports his legs "feel rubbery" this morning. Bathing at shower level and dressing with sit<>stand from TTB with supervision. Pt donned pull over shirt in standing. LOBx1 when standing to don shirt. Educated pt on safety awareness and recommended sitting to don shirt in future. Pt returned to recliner. Pt reports increased fatigue. Pt missed 30 mins skilled OT services 2/2 increased fatigue. Pt remained seated in recliner with all needs within reach. Seat alarm activated.   Therapy Documentation Precautions:  Precautions Precautions: Fall, Back Precaution Comments: no brace needed; recalled 3/3 precautions without cue Restrictions Weight Bearing Restrictions: No General: General OT Amount of Missed Time: 30 Minutes Pain:  Pt reports 5/10 pain across chest and back; RN aware and meds admin prior to therapy   Therapy/Group: Individual Therapy  Rich Brave 04/27/2023, 9:18 AM

## 2023-04-27 NOTE — Plan of Care (Signed)
  Problem: SCI BOWEL ELIMINATION Goal: RH STG MANAGE BOWEL WITH ASSISTANCE Description: STG Manage Bowel with Mod I Assistance. Outcome: Progressing Goal: RH STG SCI MANAGE BOWEL WITH MEDICATION WITH ASSISTANCE Description: STG SCI Manage bowel with medication with mod I assistance. Outcome: Progressing Goal: RH STG MANAGE BOWEL W/EQUIPMENT W/ASSISTANCE Description: STG Manage Bowel With Equipment With Mod I Assistance Outcome: Progressing Goal: RH OTHER STG BOWEL ELIMINATION GOALS W/ASSIST Description: Other STG Bowel Elimination Goals With Mod I Assistance. Outcome: Progressing   Problem: SCI BLADDER ELIMINATION Goal: RH STG MANAGE BLADDER WITH ASSISTANCE Description: STG Manage Bladder With Mod I Assistance Outcome: Progressing Goal: RH STG MANAGE BLADDER WITH MEDICATION WITH ASSISTANCE Description: STG Manage Bladder With Medication With Mod I Assistance. Outcome: Progressing Goal: RH OTHER STG BLADDER ELIMINATION GOALS W/ASSIST Description: Other STG Bladder Elimination Goals With Assistance Outcome: Progressing   Problem: RH SKIN INTEGRITY Goal: RH STG SKIN FREE OF INFECTION/BREAKDOWN Outcome: Progressing Goal: RH STG MAINTAIN SKIN INTEGRITY WITH ASSISTANCE Description: STG Maintain Skin Integrity With Assistance. Outcome: Progressing   Problem: RH SAFETY Goal: RH STG ADHERE TO SAFETY PRECAUTIONS W/ASSISTANCE/DEVICE Description: STG Adhere to Safety Precautions With Assistance/Device. Outcome: Progressing   Problem: RH KNOWLEDGE DEFICIT SCI Goal: RH STG INCREASE KNOWLEDGE OF SELF CARE AFTER SCI Outcome: Progressing   Problem: Education: Goal: Ability to verbalize activity precautions or restrictions will improve Outcome: Progressing Goal: Knowledge of the prescribed therapeutic regimen will improve Outcome: Progressing Goal: Understanding of discharge needs will improve Outcome: Progressing   Problem: Activity: Goal: Ability to avoid complications of mobility  impairment will improve Outcome: Progressing Goal: Ability to tolerate increased activity will improve Outcome: Progressing Goal: Will remain free from falls Outcome: Progressing   Problem: Bowel/Gastric: Goal: Gastrointestinal status for postoperative course will improve Outcome: Progressing   Problem: Clinical Measurements: Goal: Ability to maintain clinical measurements within normal limits will improve Outcome: Progressing Goal: Postoperative complications will be avoided or minimized Outcome: Progressing Goal: Diagnostic test results will improve Outcome: Progressing   Problem: Pain Management: Goal: Pain level will decrease Outcome: Progressing   Problem: Skin Integrity: Goal: Will show signs of wound healing Outcome: Progressing   Problem: Health Behavior/Discharge Planning: Goal: Identification of resources available to assist in meeting health care needs will improve Outcome: Progressing   Problem: Bladder/Genitourinary: Goal: Urinary functional status for postoperative course will improve Outcome: Progressing

## 2023-04-27 NOTE — Progress Notes (Signed)
Physical Therapy Session Note  Patient Details  Name: Nathan Berg MRN: [id number] Date of Birth: 24-Feb-1956  Today's Date: 04/27/2023 PT Individual Time: 1005-1115 PT Individual Time Calculation (min): 70 min   Short Term Goals: Week 2:  PT Short Term Goal 1 (Week 2): =LTGs d/t ELOS  Skilled Therapeutic Interventions/Progress Updates: Pt presented in recliner agreeable to therapy. Discussed with pt current fatigue level with pt indicating currently 5/10. Continued education on energy conservation throughout session. Pt noted to have x 1 blister covered with Tegaderm as others have opened. Pt donned shoes with set up and completed ambulatory transfer to w/c with RW and CGA. Pt propelled to elevators with supervision and transported remaining distance to [facility] entrance. Pt then participated in ambulation for endurance ambulating around water fountain x 3 with RW and CGA.Pt noted to become increasingly ataxic with fatigue but demonstrated overall good safety. Pt indicated 7/10 fatigue level after ambulation. Pt participated in OTAGO exercises with education on benefits of consistently performing. Pt performed LAQ 2 x 10, Sit to stand 2 x 5, then ambulated an additional 164ft before completing OTAGO exercises at retaining wall. Pt then performed hip abd/add, toe/heel rocks, SLS, and tandem stance taking appropriate rest breaks as needed. Pt then transported back to unit and completed ascending/descending x 12 steps with B rails self selecting between step to and step through pattern. Pt then transported back to room and completed ambulatory transfer back to recliner in same method as prior. Pt left in recliner at end of session with call bell within reach and needs met.      Therapy Documentation Precautions:  Precautions Precautions: Fall, Back Precaution Comments: no brace needed; recalled 3/3 precautions without cue Restrictions Weight Bearing Restrictions: No General:   Vital Signs: Therapy  Vitals Temp: 97.8 F (36.6 C) Temp Source: Oral Pulse Rate: 72 Resp: 18 BP: 120/69 Patient Position (if appropriate): Sitting Oxygen Therapy SpO2: 98 % O2 Device: Room Air Pain: Pain Assessment Pain Score: 3  Mobility:   Locomotion :    Trunk/Postural Assessment :    Balance:   Exercises:   Other Treatments:      Therapy/Group: Individual Therapy  Shannan Slinker 04/27/2023, 4:31 PM

## 2023-04-28 MED ORDER — ENOXAPARIN (LOVENOX) PATIENT EDUCATION KIT
PACK | Freq: Once | Status: AC
  Filled 2023-04-28: qty 1

## 2023-04-28 NOTE — Procedures (Signed)
Name: Nathan Berg  MRN: [id number]  Date: 04/19/2023   DOB: 1956/01/28  Stereotactic Radiosurgery Operative Note  PRE-OPERATIVE DIAGNOSIS:  Spinal Metastasis  POST-OPERATIVE DIAGNOSIS:  Spinal Metastasis  PROCEDURE:  Stereotactic Radiosurgery  SURGEON:  Mariam Dollar, MD  NARRATIVE: The patient underwent a radiation treatment planning session in the radiation oncology simulation suite under the care of the radiation oncology physician and physicist.  I participated closely in the radiation treatment planning afterwards. The patient underwent planning CT myelogram which was fused to the MRI.  These images were fused on the planning system.  Radiation oncology contoured the gross target volume and subsequently expanded this to yield the Planning Target Volume. I actively participated in the planning process.  I helped to define and review the target contours and also the contours of the spinal cord, and selected nearby organs at risk.  All the dose constraints for critical structures were reviewed and compared to AAPM Task Group 101.  The prescription dose conformity was reviewed.  I approved the plan electronically.    Accordingly, [name] was brought to the TrueBeam stereotactic radiation treatment linac and placed in the custom immobilization device.  The patient was aligned according to the IR fiducial markers with BrainLab Exactrac, then orthogonal x-rays were used in ExacTrac with the 6DOF robotic table and the shifts were made to align the patient.  Then conebeam CT was performed to verify precision.  [name] received stereotactic radiosurgery uneventfully.  The detailed description of the procedure is recorded in the radiation oncology procedure note.  I was present for the duration of the procedure.  DISPOSITION:  Following delivery, the patient was transported to nursing in stable condition and monitored for possible acute effects to be discharged to home in stable  condition with follow-up in one month.  Mariam Dollar, MD 04/28/2023 12:59 PM

## 2023-04-28 NOTE — Addendum Note (Signed)
Encounter addended by: Donalee Citrin, MD on: 04/28/2023 12:59 PM  Actions taken: Clinical Note Signed

## 2023-04-28 NOTE — Progress Notes (Signed)
Physical Therapy Session Note  Patient Details  Name: Nathan Berg MRN: [id number] Date of Birth: 1956/01/06  Today's Date: 04/28/2023 PT Individual Time: 0900-1000 PT Individual Time Calculation (min): 60 min   Short Term Goals: Week 2:  PT Short Term Goal 1 (Week 2): =LTGs d/t ELOS  Skilled Therapeutic Interventions/Progress Updates:    pt received in bed and agreeable to therapy. Pt reports elevated back pain and fatigue today, premedicated. Rest and positioning provided as needed.   Stand pivot transfer recliner<>w/c with CGA and RW. Sit to stand with supervision throughout.   Pt participated in stair training  4 x 4 stairs with seated rest breaks, using RW at top of stairs and back wards technique last rep to simulate home environment. Reinforced that pt will need CGA-min A at home d/t not having grab bars at the stairs inside his home.   Pt also performed car transfer with supervision. Discussed and cued for transfer without RW using grab bars within the vehicle. Education for safe transfers and appropriate hand holds.   Pt then performed step taps on 8" step with challenge to decrease UE support, including some reps with no UE support. CGA-min a overall. Also performed squats with 1 leg on step to retrieve cones for additional single leg stance strength, cues to maintain back precautions.   Pt returned to room and to recliner, was left with all needs in reach and alarm active.   Therapy Documentation Precautions:  Precautions Precautions: Fall, Back Precaution Comments: no brace needed; recalled 3/3 precautions without cue Restrictions Weight Bearing Restrictions: No General:       Therapy/Group: Individual Therapy  Juluis Rainier 04/28/2023, 4:07 PM

## 2023-04-28 NOTE — Progress Notes (Signed)
Occupational Therapy Session Note  Patient Details  Name: NATHEN WEITZNER MRN: [id number] Date of Birth: [birth year]  Today's Date: 04/28/2023 OT Individual Time: 0700-0740 OT Individual Time Calculation (min): 40 min    Short Term Goals: Week 1:  OT Short Term Goal 1 (Week 1): STG=LTG d/t ELOS  Skilled Therapeutic Interventions/Progress Updates:    Pt resting in w/c upon arrival. Discussed possible delay of discharge until 10/29. Pt reports he will probably go ahead and go home 10/26 as originally scheduled and have someone take him to radiation on 10/28. Pt requested to use toilet. Amb with RW to bathroom. Amb and toileting with supervision. Pt stood at sink to brush teeth. No LOB noted but pt reports his legs feel weak. Pt returned to recliner. All needs wihtin reahc. Seat alarm activated.   Therapy Documentation Precautions:  Precautions Precautions: Fall, Back Precaution Comments: no brace needed; recalled 3/3 precautions without cue Restrictions Weight Bearing Restrictions: No Pain: Pt c/o 6/10 pain across chest and back; meds admin prior to therapy  Therapy/Group: Individual Therapy  Rich Brave 04/28/2023, 7:45 AM

## 2023-04-28 NOTE — Progress Notes (Signed)
Physical Therapy Session Note  Patient Details  Name: JOHNROBERT BANH MRN: [id number] Date of Birth: [birth year]  Today's Date: 04/28/2023 PT Individual Time: 1120-1200 PT Individual Time Calculation (min): 40 min   Short Term Goals: Week 2:  PT Short Term Goal 1 (Week 2): =LTGs d/t ELOS  Skilled Therapeutic Interventions/Progress Updates: Pt presented in recliner agreeable to therapy. Pt c/o significant pain in chest/shingles/lesion area. Premedicated with rest and repositioning provided as needed. Pain improved throughout session with mobility. Pt also expressed significant fatigue from previous sessions with education throughout session regarding energy conservation. Pt agreeable to work on mobility and endurance outside in community session. Pt completed ambulatory transfer to w/c and pt transported to [facility] entrance for pain management and energy conservation. In [facility] patio pt ambulated varying distances with RW and light CGA. PTA noted with fatigue pt's adduction more apparent with near scissoring gait at times. Pt ambulated distances varying between 120-200 ft with pt completing transfers with supervision. Pt transported back to room at end of session and remained in w/c at end of session with call bell within reach and needs met.      Therapy Documentation Precautions:  Precautions Precautions: Fall, Back Precaution Comments: no brace needed; recalled 3/3 precautions without cue Restrictions Weight Bearing Restrictions: No General:   Vital Signs: Therapy Vitals Temp: 98.5 F (36.9 C) Temp Source: Oral Pulse Rate: 70 Resp: 18 BP: (!) 130/91 Patient Position (if appropriate): Lying Oxygen Therapy SpO2: 99 % O2 Device: Room Air Pain: Pain Assessment Pain Scale: 0-10 Pain Score: 4  Pain Type: Acute pain Pain Location: Rectum Pain Orientation: Right;Left Pain Descriptors / Indicators: Aching Pain Frequency: Intermittent Pain Onset: Gradual Pain Intervention(s): Pain med  given for lower pain score than stated, per patient request   Therapy/Group: Individual Therapy  Jenin Birdsall 04/28/2023, 3:46 PM

## 2023-04-28 NOTE — Progress Notes (Addendum)
Patient ID: Nathan Berg, male   DOB: 1955-12-21, 67 y.o.   MRN: [id number]  SW met with pt to discuss discharge plan. Pt reports he will d/c to home on Saturday (10/26) as originally planned, and will have family pick him up (son and wife).    SW cancelled transportation scheduled with [facility] for 10/28.   SW updated medical team, and [facility] SW- Darl Pikes on above changes. Sw updated [facility] on changes as well.   Cecile Sheerer, MSW, LCSW Office: [phone] Cell: [phone] Fax: [phone]

## 2023-04-28 NOTE — Progress Notes (Signed)
Patient ID: Nathan Berg, male   DOB: 1955-12-15, 67 y.o.   MRN: [id number]  Bedside RN reports pt has a scheduled radiation treatment tomorrow at 12:00. Have scheduled [facility] for 11:15 pick up and gave correct room number. Will have packet at the desk for tomorrow.

## 2023-04-28 NOTE — Progress Notes (Signed)
Inpatient Rehabilitation Care Coordinator Discharge Note DC sat 10/26  Patient Details  Name: Nathan Berg MRN: [id number] Date of Birth: Apr 08, 1956   Discharge location: D/c to home  Length of Stay: 17 days  Discharge activity level: Supervision/CGA  Home/community participation: Limited  Patient response VQ:QVZDGL Literacy - How often do you need to have someone help you when you read instructions, pamphlets, or other written material from your doctor or pharmacy?: Never  Patient response OV:FIEPPI Isolation - How often do you feel lonely or isolated from those around you?: Rarely  Services provided included: MD, PT, RN, CM, Pharmacy, Neuropsych, SW, TR, OT, RD  Financial Services:  Financial Services Utilized: Medicare    Choices offered to/list presented to: patient  Follow-up services arranged:  Home Health, DME, Patient/Family has no preference for HH/DME agencies Home Health Agency: [facility] for HHPT/OT/SN/aide/SW    DME : [employer] [facility] for RW and 3in1 BSC    Patient response to transportation need: Is the patient able to respond to transportation needs?: Yes In the past 12 months, has lack of transportation kept you from medical appointments or from getting medications?: No In the past 12 months, has lack of transportation kept you from meetings, work, or from getting things needed for daily living?: No   Patient/Family verbalized understanding of follow-up arrangements:  Yes  Individual responsible for coordination of the follow-up plan: contact pt [phone]  Confirmed correct DME delivered: Gretchen Short 04/28/2023    Comments (or additional information):  Summary of Stay    Date/Time Discharge Planning CSW  04/25/23 1115 D/c plan remains to be home with his wife who is able to provide limited support 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 There are limited natural supports to assist with  transportation to appointments. [place]. Pt wife reports pt needs to be able to walk upon returning home. Pt has radiaiton treatments on !0/15, 17,21,23,and 25. PET scan will be rescheduled per oncology physician. SW Will confirm there are no barriers to discharge. 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 There are limited natural supports to assist with transportation to appointments. pt [place] for [place] city [place]. Pt wife reports pt needs to be able to walk upon returning home. Pt has radiaiton treatments on !0/15, 17,21,23,and 25. PET scan will be rescheduled per oncology physician. SW Will confirm there are no barriers to discharge. AAC       Auria A Lula Olszewski

## 2023-04-28 NOTE — Progress Notes (Signed)
PROGRESS NOTE   Subjective/Complaints:  Pt asking when can come off precautions- getting bathed, and brushing teeth this Am- Said leaving Saturday and will come back for PET scan as well as last radiation- doesn't want to stay here any longer than Saturday.      ROS: See HPI   Pt denies SOB, abd pain, CP, N/V/C/D, and vision changes    Pain limiting (+) pain his At level T3 SCI pain  Objective:   No results found. No results for input(s): "WBC", "HGB", "HCT", "PLT" in the last 72 hours.    No results for input(s): "NA", "K", "CL", "CO2", "GLUCOSE", "BUN", "CREATININE", "CALCIUM" in the last 72 hours.     Intake/Output Summary (Last 24 hours) at 04/28/2023 0901 Last data filed at 04/28/2023 0739 Gross per 24 hour  Intake 1140 ml  Output 750 ml  Net 390 ml         Physical Exam: Vital Signs Blood pressure 138/80, pulse 69, temperature 97.7 F (36.5 C), temperature source Oral, resp. rate 18, height 5\' 10"  (1.778 m), weight 86.9 kg, SpO2 98%.        General: awake, alert, appropriate, sitting in bedside chair, however working with OT- to get up to sink- with RW; NAD HENT: conjugate gaze; oropharynx moist CV: regular rate and rhythm; no JVD Pulmonary: CTA B/L; no W/R/R- good air movement GI: soft, NT, ND, (+)BS Psychiatric: appropriate- still depressed Neurological: Ox3   Skin- 2 of 3 blisters are scabbed over- reddened areas now just color changes- however 1 blister has fluid in it stil- more flat  Has scabbing on top of it- covered with - moist due to dressing/tegaderm PRIOR EXAMS: Skin-skin appears either healed over with just color left and 3 blisters appear to have  scabbed over- 1 never popped, however is absorbing fluid and almost flat now-  No signs of spasticity Has T3 TTP- c/o feeling tight  Skin- posterior Cervical spine incision- steristrips intact Neuro:  Alert and oriented x 3,  follows commands, CN 2-12 intact, no speech or language deficits noted, good insight and awareness RUE: 5/5 Deltoid, 5/5 Biceps, 5/5 Triceps, 5/5 Wrist Ext, 5/5 Grip LUE: 4/5 Deltoid, 5/5 Biceps, 5/5 Triceps, 5/5 Wrist Ext, 5/5 Grip RLE: HF 5/5, KE 5/5, ADF 5/5, APF 5/5 LLE: HF 5/5, KE 5/5,  ADF 5/5, APF 5/5 Sensory exam normal for light touch and pain in all 4 limbs. No limb ataxia or cerebellar signs. No abnormal tone appreciated.  No ankle clonus Musculoskeletal: L shoulder pain and crepitus with PROM, no significant L shoulder tenderness   .  Assessment/Plan: 1. Functional deficits which require 3+ hours per day of interdisciplinary therapy in a comprehensive inpatient rehab setting. Physiatrist is providing close team supervision and 24 hour management of active medical problems listed below. Physiatrist and rehab team continue to assess barriers to discharge/monitor patient progress toward functional and medical goals  Care Tool:  Bathing    Body parts bathed by patient: Right arm, Left lower leg, Face, Left arm, Chest, Abdomen, Front perineal area, Buttocks, Right upper leg, Left upper leg, Right lower leg         Bathing assist Assist  Level: Supervision/Verbal cueing     Upper Body Dressing/Undressing Upper body dressing   What is the patient wearing?: Pull over shirt    Upper body assist Assist Level: Independent    Lower Body Dressing/Undressing Lower body dressing      What is the patient wearing?: Underwear/pull up, Pants     Lower body assist Assist for lower body dressing: Supervision/Verbal cueing     Toileting Toileting    Toileting assist Assist for toileting: Independent with assistive device     Transfers Chair/bed transfer  Transfers assist     Chair/bed transfer assist level: Supervision/Verbal cueing     Locomotion Ambulation   Ambulation assist      Assist level: Minimal Assistance - Patient > 75% Assistive device:  Walker-rolling Max distance: 84 ft   Walk 10 feet activity   Assist     Assist level: Minimal Assistance - Patient > 75% Assistive device: Walker-rolling   Walk 50 feet activity   Assist    Assist level: Minimal Assistance - Patient > 75% Assistive device: Walker-rolling    Walk 150 feet activity   Assist Walk 150 feet activity did not occur: Safety/medical concerns         Walk 10 feet on uneven surface  activity   Assist Walk 10 feet on uneven surfaces activity did not occur: Safety/medical concerns         Wheelchair     Assist Is the patient using a wheelchair?: Yes Type of Wheelchair: Manual    Wheelchair assist level: Supervision/Verbal cueing Max wheelchair distance: 150 ft    Wheelchair 50 feet with 2 turns activity    Assist        Assist Level: Supervision/Verbal cueing   Wheelchair 150 feet activity     Assist      Assist Level: Supervision/Verbal cueing   Blood pressure 138/80, pulse 69, temperature 97.7 F (36.5 C), temperature source Oral, resp. rate 18, height 5\' 10"  (1.778 m), weight 86.9 kg, SpO2 98%.  Medical Problem List and Plan: 1. 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              -Plan for radiation treatments week of 10/14             -patient may shower             -ELOS/Goals: 10-12, PT/OT Supervision              D/c date 10/26  Doesn't want to stay longer in spite of radiation going til next week  Cont' CIR PT and OT On air/contact precautions  Having radiation M/W/F this week 2.  Antithrombotics: -DVT/anticoagulation:  Mechanical:  Antiembolism stockings, knee (TED hose) Bilateral lower extremities. Awaiting call back from NS regarding initiating pharmacologic DVT prophylaxis-- now on Lovenox 40mg  BID 10/24- will need to go home on Lovenox- - wrote for lovenox  education kit             -antiplatelet therapy: none   3. Pain Management: Tylenol as needed             -continue Flexeril 10 mg TID prn             -continue Duragesic patch 12/mcg/hr q 72 hours -continue gabapentin 300 mg TID             -continue Lidoderm              -  continue oxycodone 10 mg q 4 hrs prn>> d/c'd             -continue ibuprofen 400 mg q 6 hrs prn -10/10- will change Duragesic to 25 mcg q3 days as well as reduce Oxy to 5 mg q4 hours prn- since feels like patch more effective than Oxy.  -10/11- pain slightly better, but Oxy doesn't work- will change Oxy to Dilaudid 2-4 mg q4 hours prn and maintain Duragesic patch 25 mcg- pt happy with change.  -04/17/23 pain intensity worse today but worked hard yesterday; not needing pain meds too much before they're "due" so will leave meds in place for now; defer adjustments to weekday team if needed.  10/14- pain adequate control per pt- will con't current regimen for now 10/15- main pain is at level SCI pain- will increase gabapentin to 400 mg TID_ if tolerates, will go to 600 mg TID- cannot add Duloxetine since on high dose of Lexapro-  10/16- will increase gabapentin tomorrow to 600 mg TID 10/21- said still having pain, more than he expected at this point- in T3 dermatome- the tight grabbing pain- will increase gabapentin to 900 mg TID to help 10/22- no improvement in pain so far- will monitor since increased gabapentin  10/23- needs to f/u with Oncology for pain meds once leaves hospital  4. Mood/Behavior/Sleep: LCSW to evaluate and provide emotional support             -History of GAD             -continue Lexapro 30 mg daily             -continue Seroquel 200 mg q HS             -continue trazodone 25 mg q HS             -continue Xanax 0.25 mg BID prn             -continue melatonin 3 mg q HS prn              10/14- sleeping well 10/15- somewhat anxious today since schedule of therapy and radiation/oncology appts overlap- d/w  SW_ will look into it- Oncology takes priority.   10/16- doing better after yesterday  10/17- more anxious today- asked him to try Xanax that has Rx for prn 10/18- dealt with radiation day- which stresses him out- took Xanax yesterday to help with transfer to W-L 10/22- scheduled Xanax -went better  5. Neuropsych/cognition: This patient is capable of making decisions on his own behalf.   6. Skin/Wound Care: Routine skin care checks             -monitor incision>>keep the Steri-Strips dry during showers per Dr. Wynetta Emery   7. Fluids/Electrolytes/Nutrition: Routine Is and Os and follow-up chemistries             -continue Mag-ox 400 mg BID             -continue B12 1000 mcg daily             -Hypokalemia/Hyponatremia- Recheck labs tomorrow -10/10- K+ 5.0- and Na 137- had been hypokalemic- so likely hemolysis- wil recheck Monday 10/15- K+ 5.0- top end of normal 10/21- K+ 4.6- doing better 8: Hypertension: controlled, monitor TID and prn   -04/17/23 BPs sort of variable but mostly ok, monitor for now.   10/16-19- BP doing well -controlled- cont' regimen   -04/24/23 BPs a little up this morning but overall fairly  stable.  10/22- BP somewhat elevated this AM, but not the rest of day- will monitor  10/23- 10/24- better today- con't regimen Vitals:   04/24/23 2010 04/25/23 0308 04/25/23 1412 04/25/23 1705  BP: 118/80 137/78 (!) 150/87 136/76   04/25/23 2001 04/26/23 0445 04/26/23 1356 04/26/23 1932  BP: 135/81 (!) 151/90 122/76 129/81   04/27/23 0525 04/27/23 1507 04/27/23 2123 04/28/23 0510  BP: 123/78 120/69 125/84 138/80    9: GERD: continue Protonix 40mg  BID   10: Current History of prostate cancer with mets to spine s/p decompressive laminectomy T3 and evacuation of epidural tumor Dr. Wynetta Emery 09/26             -continue Xtandi             -continue Decadron 4mg  q6h             -no brace needed; no twisting, arching, bending             -follow-up with Dr. Wynetta Emery, Dr. Mosetta Putt and Dr. Kathrynn Running    -10/11- starting radiation next week per notes-   10/14- starting radiation this week 10/15- starting today- d/w pharmacy- restarting Zometa and Lupron per Dr Cherly Hensen and pharmacy. 10/16 - started radiation yesterday 10/17- more radiation today- is concerned won't go well today- asked  if pt wanted to try Xanax for anxiety related to today-  10/18- used Xanax- went a little better 10/21- doing Radiation M,W/F this week- suggest using Xanax before he goes.  10/22- went better with Xanax- snoozed in and out after radiation- so went better per pt 10/24- didn't do Radiation yesterday due to machine not working- will push radiation to finish next week- pt still wants to leave Saturday still 11: Anemia: multifactorial             -follow-up CBC   -10/10- Hb 9.9 a little better  10/21- Hb 9.5- overall stable 12. Constipation, cont miralax BID and senokotS 1 tab BID -10/10- will try Sorbitol 15cc- since doesn't want to blow out- since LBM 2 days ago- and feels constipated. -10/11- didn't have BM- will order Sorbitol 30cc and pt to take prune juice at lunch- ordered at 4pm.  -04/24/23 LBM yesterday, cont regimen 13. Heart murmur:  -04/16/23 pt noted to have heart murmur, has been mentioned to him before, doubt need for emergent w/up although might need cardiology f/up for echo/eval.  14. Blisters on chest- Herpes Zoster/Shingles 10/16- not painful- don't look like shingles- looks like reaction to tape? Keep covered and monitor 10/17- I'm not sure if has shingles- is on precautions for it- will call ID since Infection prevention not able to decide.   - have paged Dr Daiva Eves-   Dr Daiva Eves said will get viral PCR so we are certain if shingles or not.   10/18- Oral Acyclovir changed to Valtrex by ID. 10/22- 2 of 3 blisters scabbed over- 1 still has a little fluid in it- and has scabbing on top, but not completely gone- will place dressing per infection control and monitor 10/24- will ask nursing to  change dressing to gauze and covered over that 15. Fatigue/malaise due to radiation?  10/21- will try Amantadine 100 mg daily and see if that helps his energy level- explained its not a GREAT chance of helping, but we can try.   10/22- feeling worse- thinks it's due to Lack of testosterone due to meds for Cancer- which is reasonable-  will con't Amantadine for now and see if  helps longer term      LOS: 15 days A FACE TO FACE EVALUATION WAS PERFORMED  Giorgia Wahler 04/28/2023, 9:01 AM

## 2023-04-29 ENCOUNTER — Telehealth (HOSPITAL_COMMUNITY): Payer: Self-pay

## 2023-04-29 ENCOUNTER — Ambulatory Visit: Payer: Medicare Other

## 2023-04-29 ENCOUNTER — Other Ambulatory Visit (HOSPITAL_COMMUNITY): Payer: Self-pay

## 2023-04-29 ENCOUNTER — Ambulatory Visit
Admit: 2023-04-29 | Discharge: 2023-04-29 | Disposition: A | Discharge status: Home / Self Care | Payer: Medicare Other | Attending: Radiation Oncology | Admitting: Radiation Oncology

## 2023-04-29 ENCOUNTER — Other Ambulatory Visit: Payer: Self-pay

## 2023-04-29 ENCOUNTER — Encounter: Payer: Self-pay | Admitting: Hematology

## 2023-04-29 DIAGNOSIS — C7951 Secondary malignant neoplasm of bone: Secondary | ICD-10-CM

## 2023-04-29 DIAGNOSIS — Z510 Encounter for antineoplastic radiation therapy: Secondary | ICD-10-CM | POA: Diagnosis not present

## 2023-04-29 LAB — RAD ONC ARIA SESSION SUMMARY
Course Elapsed Days: 10
Plan Fractions Treated to Date: 4
Plan Prescribed Dose Per Fraction: 8 Gy
Plan Total Fractions Prescribed: 5
Plan Total Prescribed Dose: 40 Gy
Reference Point Dosage Given to Date: 32 Gy
Reference Point Session Dosage Given: 8 Gy
Session Number: 4

## 2023-04-29 MED ORDER — PANTOPRAZOLE SODIUM 40 MG PO TBEC
40.0000 mg | DELAYED_RELEASE_TABLET | Freq: Two times a day (BID) | ORAL | 1 refills | Status: DC
  Filled 2023-04-29: qty 60, 30d supply, fill #0

## 2023-04-29 MED ORDER — MAGNESIUM OXIDE -MG SUPPLEMENT 400 (240 MG) MG PO TABS
400.0000 mg | ORAL_TABLET | Freq: Two times a day (BID) | ORAL | 0 refills | Status: AC
  Filled 2023-04-29: qty 60, 30d supply, fill #0

## 2023-04-29 MED ORDER — FENTANYL 25 MCG/HR TD PT72
1.0000 | MEDICATED_PATCH | TRANSDERMAL | 0 refills | Status: DC
  Filled 2023-04-29: qty 5, 15d supply, fill #0

## 2023-04-29 MED ORDER — ACETAMINOPHEN 325 MG PO TABS: 325.0000 mg | ORAL_TABLET | ORAL | Status: DC | PRN

## 2023-04-29 MED ORDER — VALACYCLOVIR HCL 1 G PO TABS
1000.0000 mg | ORAL_TABLET | Freq: Three times a day (TID) | ORAL | 0 refills | Status: AC
  Filled 2023-04-29: qty 18, 6d supply, fill #0

## 2023-04-29 MED ORDER — QUETIAPINE FUMARATE 100 MG PO TABS
200.0000 mg | ORAL_TABLET | Freq: Every day | ORAL | 0 refills | Status: DC
  Filled 2023-04-29: qty 60, 30d supply, fill #0

## 2023-04-29 MED ORDER — ESCITALOPRAM OXALATE 20 MG PO TABS
30.0000 mg | ORAL_TABLET | Freq: Every day | ORAL | 0 refills | Status: DC
Start: 2023-04-29 — End: 2023-08-10
  Filled 2023-04-29: qty 45, 30d supply, fill #0

## 2023-04-29 MED ORDER — ALPRAZOLAM 1 MG PO TABS: ORAL_TABLET | ORAL | Status: DC

## 2023-04-29 MED ORDER — TRAZODONE HCL 50 MG PO TABS
25.0000 mg | ORAL_TABLET | Freq: Every day | ORAL | 0 refills | Status: DC
  Filled 2023-04-29: qty 30, 60d supply, fill #0

## 2023-04-29 MED ORDER — MELATONIN 3 MG PO TABS: 3.0000 mg | ORAL_TABLET | Freq: Every evening | ORAL | Status: DC | PRN

## 2023-04-29 MED ORDER — GABAPENTIN 300 MG PO CAPS
ORAL_CAPSULE | ORAL | 0 refills | Status: DC
  Filled 2023-04-29: qty 270, 30d supply, fill #0

## 2023-04-29 MED ORDER — FENTANYL 25 MCG/HR TD PT72
1.0000 | MEDICATED_PATCH | TRANSDERMAL | 0 refills | Status: DC
Start: 2023-05-02 — End: 2023-04-29
  Filled 2023-04-29: qty 5, 15d supply, fill #0

## 2023-04-29 MED ORDER — HYDROMORPHONE HCL 2 MG PO TABS
2.0000 mg | ORAL_TABLET | ORAL | 0 refills | Status: DC | PRN
  Filled 2023-04-29: qty 50, 5d supply, fill #0

## 2023-04-29 MED ORDER — LIDOCAINE 5 % EX PTCH
1.0000 | MEDICATED_PATCH | CUTANEOUS | 0 refills | Status: DC
  Filled 2023-04-29: qty 30, 30d supply, fill #0

## 2023-04-29 MED ORDER — ENZALUTAMIDE 80 MG PO TABS: 160.0000 mg | ORAL_TABLET | Freq: Every day | ORAL | Status: DC

## 2023-04-29 MED ORDER — AMANTADINE HCL 100 MG PO CAPS
100.0000 mg | ORAL_CAPSULE | Freq: Every day | ORAL | 0 refills | Status: DC
  Filled 2023-04-29: qty 30, 30d supply, fill #0

## 2023-04-29 MED ORDER — CYANOCOBALAMIN 1000 MCG PO TABS
1000.0000 ug | ORAL_TABLET | Freq: Every day | ORAL | 0 refills | Status: DC
  Filled 2023-04-29: qty 30, 30d supply, fill #0

## 2023-04-29 MED ORDER — DEXAMETHASONE 4 MG PO TABS
4.0000 mg | ORAL_TABLET | Freq: Four times a day (QID) | ORAL | 0 refills | Status: DC
  Filled 2023-04-29: qty 30, 8d supply, fill #0

## 2023-04-29 NOTE — Progress Notes (Signed)
Physical Therapy Discharge Summary  Patient Details  Name: Nathan Berg MRN: [id number] Date of Birth: Mar 17, 1956  Date of Discharge from PT service:April 29, 2023  Today's Date: 04/29/2023 PT Individual Time: 1000-1055 PT Individual Time Calculation (min): 55 min    Patient has met 8 of 8 long term goals due to improved activity tolerance, improved balance, improved postural control, and ability to compensate for deficits.  Patient to discharge at an ambulatory level Supervision.   Patient's care partner is independent to provide the necessary physical assistance at discharge. Pt to d/c home with his wife. He is competent to direct his own care. Goals and care tool scored at typical performance, pt noted to have increased fatigue related to cancer treatments, so provided CGA at times for safety. Provided education on when to request increased assist, pt agrees.   Reasons goals not met: NA  Recommendation:  Patient will benefit from ongoing skilled PT services in home health setting to continue to advance safe functional mobility, address ongoing impairments in balance, strength, endurance, and minimize fall risk.  Equipment: wheelchair  Reasons for discharge: treatment goals met and discharge from hospital  Patient/family agrees with progress made and goals achieved: Yes  Skilled Therapeutic Interventions/Progress Updates: Pt received in recliner and agreeable to therapy.  Pt reports up to 5/10 pain with movement, nsg provided pain medication during session.  Pt with noted increase in fatigue today. Although pt is at goal level typically, provided intermittent CGA for safety. Discussed energy conservation at length, pt expressed understanding.  D/c assessment performed, including MMT, sensory, and pain interference assessments. Stair navigation x 12 steps with CGA only d/t fatigue. Car transfer with supervision. Gait limited by fatigue today but typically up to 150 ft. Pt able to  self limit for safety, demoing good awareness of energy levels and safety.   Returned to room after session, pt demoed bed mobility with mod I. Therapist adjusted w/c for appropriate sizing. Pt remained in recliner at end of session and was left with all needs in reach and alarm active.   PT Discharge Precautions/Restrictions Precautions Precautions: Fall;Back Precaution Comments: no brace needed; recalled 3/3 precautions without cue Restrictions Weight Bearing Restrictions: No Vital Signs   Pain Pain Assessment Pain Scale: 0-10 Pain Score: 7  Pain Type: Acute pain Pain Location: Back Pain Descriptors / Indicators: Aching Pain Frequency: Intermittent Pain Onset: With Activity Pain Intervention(s): Medication (See eMAR) Pain Interference Pain Interference Pain Effect on Sleep: 1. Rarely or not at all Pain Interference with Therapy Activities: 2. Occasionally Pain Interference with Day-to-Day Activities: 3. Frequently Vision/Perception  Vision - History Ability to See in Adequate Light: 0 Adequate Perception Perception: Within Functional Limits Praxis Praxis: WFL  Cognition Overall Cognitive Status: Within Functional Limits for tasks assessed Arousal/Alertness: Awake/alert Orientation Level: Oriented X4 Day of Week: Correct Memory: Appears intact Awareness: Appears intact Problem Solving: Appears intact Safety/Judgment: Appears intact Sensation Sensation Peripheral sensation comments: mild deficits from mid thigh down, ~90% normal, N+T in ankles/feet Light Touch Impaired Details: Impaired RLE;Impaired LLE Hot/Cold: Appears Intact Proprioception: Appears Intact Stereognosis: Not tested Coordination Gross Motor Movements are Fluid and Coordinated: Yes Fine Motor Movements are Fluid and Coordinated: No Motor  Motor Motor: Paraplegia Motor - Skilled Clinical Observations: generalized weakness from tumor removeal at T3 Motor - Discharge Observations: greatly  improved BLE strength, but still L>R weakness, limited by endurance.  Mobility Bed Mobility Bed Mobility: Supine to Sit Rolling Right: Independent with assistive device Rolling Left: Independent with  assistive device Supine to Sit: Independent with assistive device Transfers Transfers: Sit to Stand;Stand to Sit;Stand Pivot Transfers Sit to Stand: Independent with assistive device Stand to Sit: Independent with assistive device Stand Pivot Transfers: Independent with assistive device Transfer (Assistive device): Rolling walker Locomotion  Gait Ambulation: Yes Gait Assistance: Supervision/Verbal cueing Gait Distance (Feet): 150 Feet Assistive device: Rolling walker Gait Gait: Yes Gait Pattern: Impaired Gait Pattern: Within Functional Limits;Step-through pattern Gait velocity: decreased Stairs / Additional Locomotion Stairs: Yes Stairs Assistance: Supervision/Verbal cueing Stair Management Technique: Two rails Number of Stairs: 12 Height of Stairs: 6 Ramp: Supervision/Verbal cueing Curb: Supervision/Verbal cueing Pick up small object from the floor assist level: Moderate Assistance - Patient 50 - 74% Pick up small object from the floor assistive device: RW Wheelchair Mobility Wheelchair Mobility: Yes Wheelchair Assistance: Independent with assistive device Wheelchair Propulsion: Both upper extremities;Both lower extermities Wheelchair Parts Management: Independent Distance: 300 ft  Trunk/Postural Assessment  Cervical Assessment Cervical Assessment: Within Functional Limits Thoracic Assessment Thoracic Assessment: Exceptions to [facility] (back precautions) Lumbar Assessment Lumbar Assessment: Exceptions to [facility] Postural Control Postural Control: Deficits on evaluation  Balance Balance Balance Assessed: Yes Dynamic Sitting Balance Dynamic Sitting - Balance Support: During functional activity Dynamic Sitting - Level of Assistance: 6: Modified independent (Device/Increase  time) Static Standing Balance Static Standing - Balance Support: During functional activity;Bilateral upper extremity supported Static Standing - Level of Assistance: 6: Modified independent (Device/Increase time) Dynamic Standing Balance Dynamic Standing - Balance Support: During functional activity Dynamic Standing - Level of Assistance: 6: Modified independent (Device/Increase time) Extremity Assessment      RLE Assessment RLE Assessment: Exceptions to [facility] General Strength Comments: grossly 4+/5, endurance limited LLE Assessment LLE Assessment: Exceptions to [facility] General Strength Comments: grossly 4+/5, limited by endurance   Alphonse Asbridge C Eder Macek 04/29/2023, 12:03 PM

## 2023-04-29 NOTE — Progress Notes (Signed)
Occupational Therapy Discharge Summary  Patient Details  Name: Nathan Berg MRN: [id number] Date of Birth: 02/28/1956  Date of Discharge from OT service:April 29, 2023  Patient has met 7 of 7 long term goals due to {due to:3041651}.  Pt made steady progress with BADLs and functional transfers during this admission. Pt is mod I for all BADLs and functional transfers with RW. Pt fatigues easily and has verbalized understanding of energy conservation strategies.Family has not been present for therapy. Patient to discharge at overall {LOA:3049010} level.  Patient's care partner {care partner:3041650} to provide the necessary {assistance:3041652} assistance at discharge.    Reasons goals not met: n/a  Recommendation:  Patient will benefit from ongoing skilled OT services in {setting:3041680} to continue to advance functional skills in the area of {ADL/iADL:3041649}.  Equipment: BSC  Reasons for discharge: {Reason for discharge:3049018}  Patient/family agrees with progress made and goals achieved: {Pt/Family agree with progress/goals:3049020}  OT Discharge ADL ADL Equipment Provided: Long-handled sponge Eating: Independent Where Assessed-Eating: Chair Grooming: Independent Where Assessed-Grooming: Sitting at sink Upper Body Bathing: Modified independent Where Assessed-Upper Body Bathing: Shower Lower Body Bathing: Modified independent Where Assessed-Lower Body Bathing: Shower Upper Body Dressing: Independent Where Assessed-Upper Body Dressing: Chair Lower Body Dressing: Modified independent Where Assessed-Lower Body Dressing: Standing at sink, Sitting at sink Toileting: Modified independent Where Assessed-Toileting: Neurosurgeon Method: Proofreader: Raised Scientist, research (physical sciences): Not assessed Film/video editor: Modified independent Film/video editor Method: Manufacturing systems engineer: Grab bars, Sales promotion account executive Baseline Vision/History: 1 Wears glasses Patient Visual Report: No change from baseline Vision Assessment?: No apparent visual deficits Perception  Perception: Within Functional Limits Praxis Praxis: WFL Cognition Cognition Overall Cognitive Status: Within Functional Limits for tasks assessed Arousal/Alertness: Awake/alert Memory: Appears intact Awareness: Appears intact Problem Solving: Appears intact Safety/Judgment: Appears intact Brief Interview for Mental Status (BIMS) Repetition of Three Words (First Attempt): 3 Temporal Orientation: Year: Correct Temporal Orientation: Month: Accurate within 5 days Temporal Orientation: Day: Correct Recall: "Sock": Yes, no cue required Recall: "Blue": Yes, no cue required Recall: "Bed": Yes, no cue required BIMS Summary Score: 15 Sensation Sensation Light Touch: Impaired Detail Peripheral sensation comments: mild deficits from mid thigh down, ~90% normal Light Touch Impaired Details: Impaired RLE;Impaired LLE Hot/Cold: Appears Intact Proprioception: Appears Intact Stereognosis: Not tested Coordination Gross Motor Movements are Fluid and Coordinated: Yes Fine Motor Movements are Fluid and Coordinated: No (baseline intention tremor) Motor  Motor Motor: Paraplegia Motor - Skilled Clinical Observations: generalized weakness from tumor removeal at T3    Trunk/Postural Assessment  Cervical Assessment Cervical Assessment: Within Functional Limits Thoracic Assessment Thoracic Assessment: Exceptions to [facility] (back precautions) Lumbar Assessment Lumbar Assessment: Exceptions to [facility] (back precautions) Postural Control Postural Control: Deficits on evaluation  Balance Static Sitting Balance Static Sitting - Level of Assistance: 7: Independent Dynamic Sitting Balance Dynamic Sitting - Balance Support: During functional activity Dynamic Sitting - Level of Assistance: 6: Modified independent  (Device/Increase time) Extremity/Trunk Assessment RUE Assessment RUE Assessment: Within Functional Limits Active Range of Motion (AROM) Comments: WNL General Strength Comments: 5/5 overall LUE Assessment LUE Assessment: Within Functional Limits Active Range of Motion (AROM) Comments: WFL for movement, although 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

## 2023-04-29 NOTE — Progress Notes (Signed)
PROGRESS NOTE   Subjective/Complaints:  Pt reports doesn't want to go home on Lovenox- got response form pharmacy and oncology- doesn't have to go home on Lovenox/blood thinners.   Pt ready for d/c tomorrow-    Wants to come off pain meds- which I 'm not sure he can right now- explained will go home with 7 days of meds- however will need to get from Oncology-     ROS: See HPI    Pt denies SOB, abd pain, CP, N/V/C/D, and vision changes   Pain limiting (+) pain his At level T3 SCI pain  Objective:   No results found. No results for input(s): "WBC", "HGB", "HCT", "PLT" in the last 72 hours.    No results for input(s): "NA", "K", "CL", "CO2", "GLUCOSE", "BUN", "CREATININE", "CALCIUM" in the last 72 hours.     Intake/Output Summary (Last 24 hours) at 04/29/2023 0803 Last data filed at 04/29/2023 0744 Gross per 24 hour  Intake 720 ml  Output 800 ml  Net -80 ml         Physical Exam: Vital Signs Blood pressure (!) 162/89, pulse 66, temperature 98.2 F (36.8 C), temperature source Oral, resp. rate 18, height 5\' 10"  (1.778 m), weight 86.9 kg, SpO2 100%.         General: awake, alert, appropriate, sitting u in bedside chair; eating breakfast; NAD HENT: conjugate gaze; oropharynx moist CV: regular rate and rhythm; no JVD Pulmonary: CTA B/L; no W/R/R- good air movement GI: soft, NT, ND, (+)BS- normoactive Psychiatric: appropriate but depressed- upset initially about lovenox Neurological: Ox3   Skin- 2 of 3 blisters are scabbed over- reddened areas now just color changes- however 1 blister has fluid in it stil- more flat  Has scabbing on top of it- covered with - moist due to dressing/tegaderm PRIOR EXAMS: Skin-skin appears either healed over with just color left and 3 blisters appear to have  scabbed over- 1 never popped, however is absorbing fluid and almost flat now-  No signs of spasticity Has T3  TTP- c/o feeling tight  Skin- posterior Cervical spine incision- steristrips intact Neuro:  Alert and oriented x 3, follows commands, CN 2-12 intact, no speech or language deficits noted, good insight and awareness RUE: 5/5 Deltoid, 5/5 Biceps, 5/5 Triceps, 5/5 Wrist Ext, 5/5 Grip LUE: 4/5 Deltoid, 5/5 Biceps, 5/5 Triceps, 5/5 Wrist Ext, 5/5 Grip RLE: HF 5/5, KE 5/5, ADF 5/5, APF 5/5 LLE: HF 5/5, KE 5/5,  ADF 5/5, APF 5/5 Sensory exam normal for light touch and pain in all 4 limbs. No limb ataxia or cerebellar signs. No abnormal tone appreciated.  No ankle clonus Musculoskeletal: L shoulder pain and crepitus with PROM, no significant L shoulder tenderness   .  Assessment/Plan: 1. Functional deficits which require 3+ hours per day of interdisciplinary therapy in a comprehensive inpatient rehab setting. Physiatrist is providing close team supervision and 24 hour management of active medical problems listed below. Physiatrist and rehab team continue to assess barriers to discharge/monitor patient progress toward functional and medical goals  Care Tool:  Bathing    Body parts bathed by patient: Right arm, Left lower leg, Face, Left arm, Chest, Abdomen,  Front perineal area, Buttocks, Right upper leg, Left upper leg, Right lower leg         Bathing assist Assist Level: Independent with assistive device     Upper Body Dressing/Undressing Upper body dressing   What is the patient wearing?: Pull over shirt    Upper body assist Assist Level: Independent    Lower Body Dressing/Undressing Lower body dressing      What is the patient wearing?: Underwear/pull up, Pants     Lower body assist Assist for lower body dressing: Independent with assitive device     Toileting Toileting    Toileting assist Assist for toileting: Independent with assistive device     Transfers Chair/bed transfer  Transfers assist     Chair/bed transfer assist level: Supervision/Verbal cueing      Locomotion Ambulation   Ambulation assist      Assist level: Minimal Assistance - Patient > 75% Assistive device: Walker-rolling Max distance: 84 ft   Walk 10 feet activity   Assist     Assist level: Minimal Assistance - Patient > 75% Assistive device: Walker-rolling   Walk 50 feet activity   Assist    Assist level: Minimal Assistance - Patient > 75% Assistive device: Walker-rolling    Walk 150 feet activity   Assist Walk 150 feet activity did not occur: Safety/medical concerns         Walk 10 feet on uneven surface  activity   Assist Walk 10 feet on uneven surfaces activity did not occur: Safety/medical concerns         Wheelchair     Assist Is the patient using a wheelchair?: Yes Type of Wheelchair: Manual    Wheelchair assist level: Supervision/Verbal cueing Max wheelchair distance: 150 ft    Wheelchair 50 feet with 2 turns activity    Assist        Assist Level: Supervision/Verbal cueing   Wheelchair 150 feet activity     Assist      Assist Level: Supervision/Verbal cueing   Blood pressure (!) 162/89, pulse 66, temperature 98.2 F (36.8 C), temperature source Oral, resp. rate 18, height 5\' 10"  (1.778 m), weight 86.9 kg, SpO2 100%.  Medical Problem List and Plan: 1. 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              -Plan for radiation treatments week of 10/14             -patient may shower             -ELOS/Goals: 10-12, PT/OT Supervision              D/c date 10/26  Doesn't want to stay longer in spite of radiation going til next week  D/c tomororw Con't CIR today- radiation this afternoon- cannot take off precautions 2.  Antithrombotics: -DVT/anticoagulation:  Mechanical:  Antiembolism stockings, knee (TED hose) Bilateral lower extremities. Awaiting call back from NS regarding  initiating pharmacologic DVT prophylaxis-- now on Lovenox 40mg  BID 10/25- per oncology and pharmacy ,doesn't need to go home on Lovenox-  3. Pain Management: Tylenol as needed             -continue Flexeril 10 mg TID prn             -continue Duragesic patch 12/mcg/hr q 72 hours -continue gabapentin 300 mg TID             -  continue Lidoderm              -continue oxycodone 10 mg q 4 hrs prn>> d/c'd             -continue ibuprofen 400 mg q 6 hrs prn -10/10- will change Duragesic to 25 mcg q3 days as well as reduce Oxy to 5 mg q4 hours prn- since feels like patch more effective than Oxy.  -10/11- pain slightly better, but Oxy doesn't work- will change Oxy to Dilaudid 2-4 mg q4 hours prn and maintain Duragesic patch 25 mcg- pt happy with change.  -04/17/23 pain intensity worse today but worked hard yesterday; not needing pain meds too much before they're "due" so will leave meds in place for now; defer adjustments to weekday team if needed.  10/14- pain adequate control per pt- will con't current regimen for now 10/15- main pain is at level SCI pain- will increase gabapentin to 400 mg TID_ if tolerates, will go to 600 mg TID- cannot add Duloxetine since on high dose of Lexapro-  10/16- will increase gabapentin tomorrow to 600 mg TID 10/21- said still having pain, more than he expected at this point- in T3 dermatome- the tight grabbing pain- will increase gabapentin to 900 mg TID to help 10/22- no improvement in pain so far- will monitor since increased gabapentin  10/23- needs to f/u with Oncology for pain meds once leaves hospital  - 10/25- pt wants to wean off pain meds- however he's aware will get pain meds for 7 days, then will need from Oncology or PCP- since won't be seeing me after he leaves hospital- let Dr Loran Senters know on secure chat.  4. Mood/Behavior/Sleep: LCSW to evaluate and provide emotional support             -History of GAD             -continue Lexapro 30 mg daily              -continue Seroquel 200 mg q HS             -continue trazodone 25 mg q HS             -continue Xanax 0.25 mg BID prn             -continue melatonin 3 mg q HS prn              10/14- sleeping well 10/15- somewhat anxious today since schedule of therapy and radiation/oncology appts overlap- d/w SW_ will look into it- Oncology takes priority.   10/16- doing better after yesterday  10/17- more anxious today- asked him to try Xanax that has Rx for prn 10/18- dealt with radiation day- which stresses him out- took Xanax yesterday to help with transfer to W-L 10/22- scheduled Xanax -went better  5. Neuropsych/cognition: This patient is capable of making decisions on his own behalf.   6. Skin/Wound Care: Routine skin care checks             -monitor incision>>keep the Steri-Strips dry during showers per Dr. Wynetta Emery   7. Fluids/Electrolytes/Nutrition: Routine Is and Os and follow-up chemistries             -continue Mag-ox 400 mg BID             -continue B12 1000 mcg daily             -Hypokalemia/Hyponatremia- Recheck labs tomorrow -10/10- K+ 5.0- and Na 137- had been hypokalemic- so  likely hemolysis- wil recheck Monday 10/15- K+ 5.0- top end of normal 10/21- K+ 4.6- doing better 8: Hypertension: controlled, monitor TID and prn   -04/17/23 BPs sort of variable but mostly ok, monitor for now.   10/16-19- BP doing well -controlled- cont' regimen   -04/24/23 BPs a little up this morning but overall fairly stable.  10/22- BP somewhat elevated this AM, but not the rest of day- will monitor  10/23- 10/24- better today- con't regimen Vitals:   04/25/23 1705 04/25/23 2001 04/26/23 0445 04/26/23 1356  BP: 136/76 135/81 (!) 151/90 122/76   04/26/23 1932 04/27/23 0525 04/27/23 1507 04/27/23 2123  BP: 129/81 123/78 120/69 125/84   04/28/23 0510 04/28/23 1410 04/28/23 2005 04/29/23 0634  BP: 138/80 (!) 130/91 121/72 (!) 162/89    9: GERD: continue Protonix 40mg  BID   10: Current History of prostate  cancer with mets to spine s/p decompressive laminectomy T3 and evacuation of epidural tumor Dr. Wynetta Emery 09/26             -continue Xtandi             -continue Decadron 4mg  q6h             -no brace needed; no twisting, arching, bending             -follow-up with Dr. Wynetta Emery, Dr. Mosetta Putt and Dr. Kathrynn Running   -10/11- starting radiation next week per notes-   10/14- starting radiation this week 10/15- starting today- d/w pharmacy- restarting Zometa and Lupron per Dr Cherly Hensen and pharmacy. 10/16 - started radiation yesterday 10/17- more radiation today- is concerned won't go well today- asked  if pt wanted to try Xanax for anxiety related to today-  10/18- used Xanax- went a little better 10/21- doing Radiation M,W/F this week- suggest using Xanax before he goes.  10/22- went better with Xanax- snoozed in and out after radiation- so went better per pt 10/24- didn't do Radiation yesterday due to machine not working- will push radiation to finish next week- pt still wants to leave Saturday still 11: Anemia: multifactorial             -follow-up CBC   -10/10- Hb 9.9 a little better  10/21- Hb 9.5- overall stable 12. Constipation, cont miralax BID and senokotS 1 tab BID -10/10- will try Sorbitol 15cc- since doesn't want to blow out- since LBM 2 days ago- and feels constipated. -10/11- didn't have BM- will order Sorbitol 30cc and pt to take prune juice at lunch- ordered at 4pm.  -04/24/23 LBM yesterday, cont regimen 13. Heart murmur:  -04/16/23 pt noted to have heart murmur, has been mentioned to him before, doubt need for emergent w/up although might need cardiology f/up for echo/eval.  14. Blisters on chest- Herpes Zoster/Shingles 10/16- not painful- don't look like shingles- looks like reaction to tape? Keep covered and monitor 10/17- I'm not sure if has shingles- is on precautions for it- will call ID since Infection prevention not able to decide.   - have paged Dr Daiva Eves-   Dr Daiva Eves said will get  viral PCR so we are certain if shingles or not.   10/18- Oral Acyclovir changed to Valtrex by ID. 10/22- 2 of 3 blisters scabbed over- 1 still has a little fluid in it- and has scabbing on top, but not completely gone- will place dressing per infection control and monitor 10/24- will ask nursing to change dressing to gauze and covered over that 15. Fatigue/malaise due  to radiation?  10/21- will try Amantadine 100 mg daily and see if that helps his energy level- explained its not a GREAT chance of helping, but we can try.   10/22- feeling worse- thinks it's due to Lack of testosterone due to meds for Cancer- which is reasonable-  will con't Amantadine for now and see if helps longer term    I spent a total of  36  minutes on total care today- >50% coordination of care- due to  D/w phamracy about sending home off blood thinners- also d/w oncology about sending home on pain meds, however will need Oncology to prescribe.    LOS: 16 days A FACE TO FACE EVALUATION WAS PERFORMED  Clotiel Troop 04/29/2023, 8:03 AM

## 2023-04-29 NOTE — Progress Notes (Signed)
Patient ID: Nathan Berg, male   DOB: 1955-08-22, 67 y.o.   MRN: [id number]  Covering for primary SW, [place].  HH established with [facility]. NO DME needs currently.

## 2023-04-29 NOTE — Telephone Encounter (Signed)
Pharmacy Patient Advocate Encounter  Received notification from [employer] that Prior Authorization for Lidocaine 5% patches has been APPROVED from 03/30/2023 to 04/28/2024   PA #/Case ID/Reference #: [id number] KEY: [id number]

## 2023-04-29 NOTE — Progress Notes (Signed)
Occupational Therapy Session Note  Patient Details  Name: Nathan Berg MRN: [id number] Date of Birth: 06/29/1956  Today's Date: 04/29/2023 OT Individual Time: 7846-9629 OT Individual Time Calculation (min): 70 min    Short Term Goals: Week 1:  OT Short Term Goal 1 (Week 1): STG=LTG d/t ELOS  Skilled Therapeutic Interventions/Progress Updates:    Pt resting in recliner upon arrival. Pt declined shower this morning. Radiation scheduled for afternoon and discharge home tomorrow. Pt stated he will shower when he gets home. Recommended "dry run" with shower transfer before taking shower. Reviewed use/set up of w/c and BSC. Pt amb in room for grooming tasks. Standing at sink for brushing teeth. Slight LOB when returning to recliner. Pt able to self correct. Reviewed home safety recommendations. Recommended pt use urinal at night vs walking to bathroom. Pt in agreement. Pt verbalized understanding of all recommendations. Pt mod I for BADLs and functional transfers. Pt remained in recliner with all needs within reach.   Therapy Documentation Precautions:  Precautions Precautions: Fall, Back Precaution Comments: no brace needed; recalled 3/3 precautions without cue Restrictions Weight Bearing Restrictions: No Pain: Pt reports 3/10 pain across back and chest; meds admin prior to therapy. ADL: ADL Equipment Provided: Long-handled sponge Eating: Independent Where Assessed-Eating: Chair Grooming: Independent Where Assessed-Grooming: Sitting at sink Upper Body Bathing: Modified independent Where Assessed-Upper Body Bathing: Shower Lower Body Bathing: Modified independent Where Assessed-Lower Body Bathing: Shower Upper Body Dressing: Independent Where Assessed-Upper Body Dressing: Chair Lower Body Dressing: Modified independent Where Assessed-Lower Body Dressing: Standing at sink, Sitting at sink Toileting: Modified independent Where Assessed-Toileting: Teacher, adult education: Sales promotion account executive Method: Proofreader: Raised Scientist, research (physical sciences): Not assessed Film/video editor: Modified independent Film/video editor Method: Designer, industrial/product: Grab bars, Transfer tub bench   Therapy/Group: Individual Therapy  Rich Brave 04/29/2023, 10:14 AM

## 2023-04-30 DIAGNOSIS — R600 Localized edema: Secondary | ICD-10-CM

## 2023-04-30 NOTE — Progress Notes (Signed)
Patient is seen by PA. Home meds picked up from main pharmacy and patient is discharge, assisted by NT and family at side.

## 2023-04-30 NOTE — Progress Notes (Signed)
Inpatient Rehabilitation Discharge Medication Review by a Pharmacist  A complete drug regimen review was completed for this patient to identify any potential clinically significant medication issues.  High Risk Drug Classes Is patient taking? Indication by Medication  Antipsychotic Yes Seroquel- anxiety/depression   Anticoagulant No   Antibiotic No   Opioid Yes Fentanyl patch, hydromorphone PRN- chronic pain   Antiplatelet No   Hypoglycemics/insulin No   Vasoactive Medication No   Chemotherapy Yes, Oral Chemotherapy Xtandi- prostate cancer   Other Yes Alprazolam PRN- anxiety  Vit B12, mag ox- supplement  Lexapro- anxiety  Gabapentin, Lidocaine patch- neuropathic pain  IBU PRN/APAP PRN- pain, fever   Melatonin PRN, trazodone PRN- sleep  Pantoprazole BID- reflux   Zofran PRN- nausea  Lactulose PRN- constipation Valacyclovir- shingles treatment  Dexamethasone- inflammation  Amantadine- fatigue/malaise        Type of Medication Issue Identified Description of Issue Recommendation(s)  Drug Interaction(s) (clinically significant)     Duplicate Therapy     Allergy     No Medication Administration End Date     Incorrect Dose     Additional Drug Therapy Needed     Significant med changes from prior encounter (inform family/care partners about these prior to discharge).    Other       Clinically significant medication issues were identified that warrant physician communication and completion of prescribed/recommended actions by midnight of the next day:  No  Name of provider notified for urgent issues identified:   Provider Method of Notification:     Pharmacist comments:   Time spent performing this drug regimen review (minutes):  20   Jani Gravel, PharmD Clinical Pharmacist  04/30/2023 7:26 AM

## 2023-04-30 NOTE — Plan of Care (Signed)
  Problem: SCI BOWEL ELIMINATION Goal: RH STG MANAGE BOWEL WITH ASSISTANCE Description: STG Manage Bowel with Mod I Assistance. Outcome: Progressing Goal: RH STG SCI MANAGE BOWEL WITH MEDICATION WITH ASSISTANCE Description: STG SCI Manage bowel with medication with mod I assistance. Outcome: Progressing Goal: RH STG MANAGE BOWEL W/EQUIPMENT W/ASSISTANCE Description: STG Manage Bowel With Equipment With Mod I Assistance Outcome: Progressing Goal: RH OTHER STG BOWEL ELIMINATION GOALS W/ASSIST Description: Other STG Bowel Elimination Goals With Mod I Assistance. Outcome: Progressing   Problem: SCI BLADDER ELIMINATION Goal: RH STG MANAGE BLADDER WITH ASSISTANCE Description: STG Manage Bladder With Mod I Assistance Outcome: Progressing Goal: RH STG MANAGE BLADDER WITH MEDICATION WITH ASSISTANCE Description: STG Manage Bladder With Medication With Mod I Assistance. Outcome: Progressing Goal: RH OTHER STG BLADDER ELIMINATION GOALS W/ASSIST Description: Other STG Bladder Elimination Goals With Assistance Outcome: Progressing   Problem: RH SKIN INTEGRITY Goal: RH STG SKIN FREE OF INFECTION/BREAKDOWN Outcome: Progressing Goal: RH STG MAINTAIN SKIN INTEGRITY WITH ASSISTANCE Description: STG Maintain Skin Integrity With Assistance. Outcome: Progressing   Problem: RH SAFETY Goal: RH STG ADHERE TO SAFETY PRECAUTIONS W/ASSISTANCE/DEVICE Description: STG Adhere to Safety Precautions With Assistance/Device. Outcome: Progressing   Problem: RH KNOWLEDGE DEFICIT SCI Goal: RH STG INCREASE KNOWLEDGE OF SELF CARE AFTER SCI Outcome: Progressing   Problem: Education: Goal: Ability to verbalize activity precautions or restrictions will improve Outcome: Progressing Goal: Knowledge of the prescribed therapeutic regimen will improve Outcome: Progressing Goal: Understanding of discharge needs will improve Outcome: Progressing   Problem: Activity: Goal: Ability to avoid complications of mobility  impairment will improve Outcome: Progressing Goal: Ability to tolerate increased activity will improve Outcome: Progressing Goal: Will remain free from falls Outcome: Progressing   Problem: Bowel/Gastric: Goal: Gastrointestinal status for postoperative course will improve Outcome: Progressing   Problem: Clinical Measurements: Goal: Ability to maintain clinical measurements within normal limits will improve Outcome: Progressing Goal: Postoperative complications will be avoided or minimized Outcome: Progressing Goal: Diagnostic test results will improve Outcome: Progressing   Problem: Pain Management: Goal: Pain level will decrease Outcome: Progressing   Problem: Skin Integrity: Goal: Will show signs of wound healing Outcome: Progressing   Problem: Health Behavior/Discharge Planning: Goal: Identification of resources available to assist in meeting health care needs will improve Outcome: Progressing   Problem: Bladder/Genitourinary: Goal: Urinary functional status for postoperative course will improve Outcome: Progressing

## 2023-04-30 NOTE — Progress Notes (Signed)
PROGRESS NOTE   Subjective/Complaints:  Pt doing well, ready to go. Slept ok, pain well controlled, LBM yesterday, urinating fine. Notes that he had some LE edema start in the last day or two, no worse today though. Denies any other complaints or concerns at this time. Has questions about d/c process. Med schedule reviewed.     ROS: See HPI    Pt denies SOB, abd pain, CP, N/V/C/D, and vision changes   Pain limiting (+) pain his At level T3 SCI pain  Objective:   No results found. No results for input(s): "WBC", "HGB", "HCT", "PLT" in the last 72 hours.    No results for input(s): "NA", "K", "CL", "CO2", "GLUCOSE", "BUN", "CREATININE", "CALCIUM" in the last 72 hours.     Intake/Output Summary (Last 24 hours) at 04/30/2023 1010 Last data filed at 04/30/2023 0745 Gross per 24 hour  Intake 240 ml  Output 1325 ml  Net -1085 ml         Physical Exam: Vital Signs Blood pressure 132/83, pulse 62, temperature 98.1 F (36.7 C), resp. rate 16, height 5\' 10"  (1.778 m), weight 86.9 kg, SpO2 98%.  General: awake, alert, appropriate, sitting up in bedside chair; NAD HENT: conjugate gaze; oropharynx moist CV: regular rate and rhythm; no JVD. 1+ b/l pedal edema up to the knees Pulmonary: CTA B/L; no W/R/R- good air movement GI: soft, NT, ND, (+)BS- normoactive Psychiatric: appropriate and cooperative, more pleasant this morning than prior mornings Neurological: Ox3  PRIOR EXAMS: Skin- 2 of 3 blisters are scabbed over- reddened areas now just color changes- however 1 blister has fluid in it stil- more flat  Has scabbing on top of it- covered with - moist due to dressing/tegaderm Skin-skin appears either healed over with just color left and 3 blisters appear to have  scabbed over- 1 never popped, however is absorbing fluid and almost flat now-  No signs of spasticity Has T3 TTP- c/o feeling tight  Skin- posterior  Cervical spine incision- steristrips intact Neuro:  Alert and oriented x 3, follows commands, CN 2-12 intact, no speech or language deficits noted, good insight and awareness RUE: 5/5 Deltoid, 5/5 Biceps, 5/5 Triceps, 5/5 Wrist Ext, 5/5 Grip LUE: 4/5 Deltoid, 5/5 Biceps, 5/5 Triceps, 5/5 Wrist Ext, 5/5 Grip RLE: HF 5/5, KE 5/5, ADF 5/5, APF 5/5 LLE: HF 5/5, KE 5/5,  ADF 5/5, APF 5/5 Sensory exam normal for light touch and pain in all 4 limbs. No limb ataxia or cerebellar signs. No abnormal tone appreciated.  No ankle clonus Musculoskeletal: L shoulder pain and crepitus with PROM, no significant L shoulder tenderness   .  Assessment/Plan: 1. Functional deficits which require 3+ hours per day of interdisciplinary therapy in a comprehensive inpatient rehab setting. Physiatrist is providing close team supervision and 24 hour management of active medical problems listed below. Physiatrist and rehab team continue to assess barriers to discharge/monitor patient progress toward functional and medical goals  Care Tool:  Bathing    Body parts bathed by patient: Right arm, Left lower leg, Face, Left arm, Chest, Abdomen, Front perineal area, Buttocks, Right upper leg, Left upper leg, Right lower leg  Bathing assist Assist Level: Independent with assistive device     Upper Body Dressing/Undressing Upper body dressing   What is the patient wearing?: Pull over shirt    Upper body assist Assist Level: Independent    Lower Body Dressing/Undressing Lower body dressing      What is the patient wearing?: Underwear/pull up, Pants     Lower body assist Assist for lower body dressing: Independent with assitive device     Toileting Toileting    Toileting assist Assist for toileting: Independent with assistive device     Transfers Chair/bed transfer  Transfers assist     Chair/bed transfer assist level: Independent with assistive device Chair/bed transfer assistive device:  Arboriculturist assist      Assist level: Supervision/Verbal cueing Assistive device: Walker-rolling Max distance: 150 ft   Walk 10 feet activity   Assist     Assist level: Supervision/Verbal cueing Assistive device: Walker-rolling   Walk 50 feet activity   Assist    Assist level: Supervision/Verbal cueing Assistive device: Walker-rolling    Walk 150 feet activity   Assist Walk 150 feet activity did not occur: Safety/medical concerns  Assist level: Supervision/Verbal cueing Assistive device: Walker-rolling    Walk 10 feet on uneven surface  activity   Assist Walk 10 feet on uneven surfaces activity did not occur: Safety/medical concerns   Assist level: Supervision/Verbal cueing Assistive device: Walker-rolling   Wheelchair     Assist Is the patient using a wheelchair?: Yes Type of Wheelchair: Manual    Wheelchair assist level: Independent Max wheelchair distance: 300 ft    Wheelchair 50 feet with 2 turns activity    Assist        Assist Level: Independent   Wheelchair 150 feet activity     Assist      Assist Level: Independent   Blood pressure 132/83, pulse 62, temperature 98.1 F (36.7 C), resp. rate 16, height 5\' 10"  (1.778 m), weight 86.9 kg, SpO2 98%.  Medical Problem List and Plan: 1. 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              -Plan for radiation treatments week of 10/14             -patient may shower             -ELOS/Goals: 10-12, PT/OT Supervision              D/c date 10/26  Doesn't want to stay longer in spite of radiation going til next week  -d/c today 04/30/23, med schedule reviewed.  Con't CIR today- radiation this afternoon- cannot take off precautions 2.  Antithrombotics: -DVT/anticoagulation:  Mechanical:  Antiembolism stockings, knee  (TED hose) Bilateral lower extremities. Awaiting call back from NS regarding initiating pharmacologic DVT prophylaxis-- now on Lovenox 40mg  BID 10/25- per oncology and pharmacy ,doesn't need to go home on Lovenox-  3. Pain Management: Tylenol as needed             -continue Flexeril 10 mg TID prn             -continue Duragesic patch 12/mcg/hr q 72 hours -continue gabapentin 300 mg TID             -continue Lidoderm              -continue oxycodone 10 mg q 4  hrs prn>> d/c'd             -continue ibuprofen 400 mg q 6 hrs prn -10/10- will change Duragesic to 25 mcg q3 days as well as reduce Oxy to 5 mg q4 hours prn- since feels like patch more effective than Oxy.  -10/11- pain slightly better, but Oxy doesn't work- will change Oxy to Dilaudid 2-4 mg q4 hours prn and maintain Duragesic patch 25 mcg- pt happy with change.  -04/17/23 pain intensity worse today but worked hard yesterday; not needing pain meds too much before they're "due" so will leave meds in place for now; defer adjustments to weekday team if needed.  10/14- pain adequate control per pt- will con't current regimen for now 10/15- main pain is at level SCI pain- will increase gabapentin to 400 mg TID_ if tolerates, will go to 600 mg TID- cannot add Duloxetine since on high dose of Lexapro-  10/16- will increase gabapentin tomorrow to 600 mg TID 10/21- said still having pain, more than he expected at this point- in T3 dermatome- the tight grabbing pain- will increase gabapentin to 900 mg TID to help 10/22- no improvement in pain so far- will monitor since increased gabapentin  10/23- needs to f/u with Oncology for pain meds once leaves hospital  - 10/25- pt wants to wean off pain meds- however he's aware will get pain meds for 7 days, then will need from Oncology or PCP- since won't be seeing me after he leaves hospital- let Dr Loran Senters know on secure chat.  4. Mood/Behavior/Sleep: LCSW to evaluate and provide emotional support              -History of GAD             -continue Lexapro 30 mg daily             -continue Seroquel 200 mg q HS             -continue trazodone 25 mg q HS             -continue Xanax 0.25 mg BID prn             -continue melatonin 3 mg q HS prn              10/14- sleeping well 10/15- somewhat anxious today since schedule of therapy and radiation/oncology appts overlap- d/w SW_ will look into it- Oncology takes priority.   10/16- doing better after yesterday  10/17- more anxious today- asked him to try Xanax that has Rx for prn 10/18- dealt with radiation day- which stresses him out- took Xanax yesterday to help with transfer to [facility] 10/22- scheduled Xanax -went better  5. Neuropsych/cognition: This patient is capable of making decisions on his own behalf.   6. Skin/Wound Care: Routine skin care checks             -monitor incision>>keep the Steri-Strips dry during showers per Dr. Wynetta Emery   7. Fluids/Electrolytes/Nutrition: Routine Is and Os and follow-up chemistries             -continue Mag-ox 400 mg BID             -continue B12 1000 mcg daily             -Hypokalemia/Hyponatremia- Recheck labs tomorrow -10/10- K+ 5.0- and Na 137- had been hypokalemic- so likely hemolysis- wil recheck Monday 10/15- K+ 5.0- top end of normal 10/21- K+ 4.6- doing better 8: Hypertension: controlled, monitor  TID and prn   -04/17/23 BPs sort of variable but mostly ok, monitor for now.   10/16-19- BP doing well -controlled- cont' regimen   -04/24/23 BPs a little up this morning but overall fairly stable.  10/22- BP somewhat elevated this AM, but not the rest of day- will monitor  10/23- 10/24- better today- con't regimen  10/26 BPs mostly ok, cont regimen and monitor outpatient Vitals:   04/26/23 1356 04/26/23 1932 04/27/23 0525 04/27/23 1507  BP: 122/76 129/81 123/78 120/69   04/27/23 2123 04/28/23 0510 04/28/23 1410 04/28/23 2005  BP: 125/84 138/80 (!) 130/91 121/72   04/29/23 0634 04/29/23 1421 04/29/23  2101 04/30/23 0423  BP: (!) 162/89 131/78 (!) 154/95 132/83    9: GERD: continue Protonix 40mg  BID   10: Current History of prostate cancer with mets to spine s/p decompressive laminectomy T3 and evacuation of epidural tumor Dr. Wynetta Emery 09/26             -continue Xtandi             -continue Decadron 4mg  q6h             -no brace needed; no twisting, arching, bending             -follow-up with Dr. Wynetta Emery, Dr. Mosetta Putt and Dr. Kathrynn Running   -10/11- starting radiation next week per notes-   10/14- starting radiation this week 10/15- starting today- d/w pharmacy- restarting Zometa and Lupron per Dr Cherly Hensen and pharmacy. 10/16 - started radiation yesterday 10/17- more radiation today- is concerned won't go well today- asked  if pt wanted to try Xanax for anxiety related to today-  10/18- used Xanax- went a little better 10/21- doing Radiation M,W/F this week- suggest using Xanax before he goes.  10/22- went better with Xanax- snoozed in and out after radiation- so went better per pt 10/24- didn't do Radiation yesterday due to machine not working- will push radiation to finish next week- pt still wants to leave Saturday still 11: Anemia: multifactorial             -follow-up CBC   -10/10- Hb 9.9 a little better  10/21- Hb 9.5- overall stable 12. Constipation, cont miralax BID and senokotS 1 tab BID -10/10- will try Sorbitol 15cc- since doesn't want to blow out- since LBM 2 days ago- and feels constipated. -10/11- didn't have BM- will order Sorbitol 30cc and pt to take prune juice at lunch- ordered at 4pm.  -04/30/23 LBM yesterday, cont regimen 13. Heart murmur:  -04/16/23 pt noted to have heart murmur, has been mentioned to him before, doubt need for emergent w/up although might need cardiology f/up for echo/eval.  -04/30/23 now noted to have some pedal edema, pt without SOB or other symptoms, states edema has been for the last couple days, no hx of use of diuretics. Suspect it's dependent, advised  compression socks, elevation, low sodium, etc. Monitor closely, f/up with PCP this coming week (advised he speak with SW on Monday regarding transportation issues) 14. Blisters on chest- Herpes Zoster/Shingles 10/16- not painful- don't look like shingles- looks like reaction to tape? Keep covered and monitor 10/17- I'm not sure if has shingles- is on precautions for it- will call ID since Infection prevention not able to decide.   - have paged Dr Daiva Eves-   Dr Daiva Eves said will get viral PCR so we are certain if shingles or not.   10/18- Oral Acyclovir changed to Valtrex by ID. 10/22- 2  of 3 blisters scabbed over- 1 still has a little fluid in it- and has scabbing on top, but not completely gone- will place dressing per infection control and monitor 10/24- will ask nursing to change dressing to gauze and covered over that 15. Fatigue/malaise due to radiation?  10/21- will try Amantadine 100 mg daily and see if that helps his energy level- explained its not a GREAT chance of helping, but we can try.   10/22- feeling worse- thinks it's due to Lack of testosterone due to meds for Cancer- which is reasonable-  will con't Amantadine for now and see if helps longer term   I spent >58mins performing patient care related activities, including face to face time, documentation time, discussion of med schedule, edema, and f/up assistance with patient, and overall coordination of care and discharge planning.    LOS: 17 days A FACE TO FACE EVALUATION WAS PERFORMED  [address] 04/30/2023, 10:10 AM

## 2023-05-02 ENCOUNTER — Ambulatory Visit
Admission: RE | Admit: 2023-05-02 | Discharge: 2023-05-02 | Disposition: A | Discharge status: Home / Self Care | Payer: Medicare Other | Source: Ambulatory Visit | Attending: Radiation Oncology | Admitting: Radiation Oncology

## 2023-05-02 ENCOUNTER — Other Ambulatory Visit (HOSPITAL_COMMUNITY): Payer: Self-pay

## 2023-05-02 ENCOUNTER — Other Ambulatory Visit: Payer: Self-pay

## 2023-05-02 ENCOUNTER — Ambulatory Visit: Payer: Medicare Other

## 2023-05-02 DIAGNOSIS — C7951 Secondary malignant neoplasm of bone: Secondary | ICD-10-CM

## 2023-05-02 DIAGNOSIS — C61 Malignant neoplasm of prostate: Secondary | ICD-10-CM | POA: Diagnosis present

## 2023-05-02 DIAGNOSIS — Z510 Encounter for antineoplastic radiation therapy: Secondary | ICD-10-CM | POA: Diagnosis present

## 2023-05-02 LAB — RAD ONC ARIA SESSION SUMMARY
Course Elapsed Days: 13
Plan Fractions Treated to Date: 5
Plan Prescribed Dose Per Fraction: 8 Gy
Plan Total Fractions Prescribed: 5
Plan Total Prescribed Dose: 40 Gy
Reference Point Dosage Given to Date: 40 Gy
Reference Point Session Dosage Given: 8 Gy
Session Number: 5

## 2023-05-02 NOTE — Progress Notes (Signed)
Nathan Berg he has lab appointment at 2:30 on 10/30 for genetic testing. Would you prepare liquid biopsy through [facility] on the same draw as well? Please select the XF+ 523 genes. Thanks

## 2023-05-02 NOTE — Progress Notes (Signed)
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

## 2023-05-03 ENCOUNTER — Other Ambulatory Visit: Payer: Self-pay

## 2023-05-03 ENCOUNTER — Telehealth: Payer: Self-pay

## 2023-05-03 DIAGNOSIS — C61 Malignant neoplasm of prostate: Secondary | ICD-10-CM

## 2023-05-03 NOTE — Radiation Completion Notes (Addendum)
  Radiation Oncology         [phone] ________________________________  Name: Nathan Berg MRN: [id number]  Date: 05/02/2023  DOB: 1956-04-29   Referring Physician: Donalee Citrin, M.D. Date of Service: 2023-05-03 Radiation Oncologist: Margaretmary Bayley, M.D. [facility] [place]     RADIATION ONCOLOGY END OF TREATMENT NOTE     Diagnosis:  67 yo gentleman with progressive metastatic prostate cancer in the spine at T3/T4; s/p decompressive laminectomy on 03/31/23   Intent: Palliative     ==========DELIVERED PLANS==========  First Treatment Date: 2023-04-19 - Last Treatment Date: 2023-05-02   Plan Name: Post-op Spine_T_SRT Site: Thoracic Spine Technique: SBRT/SRT-IMRT Mode: Photon Dose Per Fraction: 8 Gy Prescribed Dose (Delivered / Prescribed): 40 Gy / 40 Gy Prescribed Fxs (Delivered / Prescribed): 5 / 5     ==========ON TREATMENT VISIT DATES========== 2023-04-19, 2023-04-21, 2023-04-25, 2023-04-29, 2023-05-02     See weekly On Treatment Notes in Epic for details.  He tolerated the treatments well without any acute ill side effects.  The patient will receive a call in about one month from the radiation oncology department. He will continue follow up with his neurosurgeon, Dr. Wynetta Emery and his medical oncologist, Dr. Cherly Hensen as well.  ------------------------------------------------   Margaretmary Dys, MD [facility]  Radiation Oncology Direct Dial: [phone]  Fax: [phone] [email or web address]  Skype  LinkedIn

## 2023-05-03 NOTE — Telephone Encounter (Signed)
TC to pt to notify of [facility] lab ordered by Dr. Cherly Hensen that he will have drawn tomorrow at lab appt. He is in agreement and aware to sign financial assistance form. [facility] order already placed in online portal.

## 2023-05-03 NOTE — Progress Notes (Signed)
[facility] lab order placed in online portal per Dr. Cherly Hensen and ordered for lab tomorrow (xF + 523 genes). [facility] kit taken to lab for pt to fill out financial assistance form.

## 2023-05-04 ENCOUNTER — Inpatient Hospital Stay: Payer: Medicare Other

## 2023-05-04 ENCOUNTER — Encounter (HOSPITAL_COMMUNITY)
Admission: RE | Admit: 2023-05-04 | Discharge: 2023-05-04 | Disposition: A | Discharge status: Home / Self Care | Payer: Medicare Other | Source: Ambulatory Visit

## 2023-05-04 ENCOUNTER — Telehealth: Payer: Self-pay | Admitting: Genetic Counselor

## 2023-05-04 DIAGNOSIS — C7951 Secondary malignant neoplasm of bone: Secondary | ICD-10-CM | POA: Insufficient documentation

## 2023-05-04 DIAGNOSIS — Z1379 Encounter for other screening for genetic and chromosomal anomalies: Secondary | ICD-10-CM

## 2023-05-04 DIAGNOSIS — C61 Malignant neoplasm of prostate: Secondary | ICD-10-CM | POA: Insufficient documentation

## 2023-05-04 LAB — CBC WITH DIFFERENTIAL (CANCER CENTER ONLY)
Abs Immature Granulocytes: 0.03 10*3/uL (ref 0.00–0.07)
Basophils Absolute: 0 10*3/uL (ref 0.0–0.1)
Basophils Relative: 0 %
Eosinophils Absolute: 0 10*3/uL (ref 0.0–0.5)
Eosinophils Relative: 0 %
HCT: 31.2 % — ABNORMAL LOW (ref 39.0–52.0)
Hemoglobin: 10.1 g/dL — ABNORMAL LOW (ref 13.0–17.0)
Immature Granulocytes: 1 %
Lymphocytes Relative: 6 %
Lymphs Abs: 0.3 10*3/uL — ABNORMAL LOW (ref 0.7–4.0)
MCH: 30.7 pg (ref 26.0–34.0)
MCHC: 32.4 g/dL (ref 30.0–36.0)
MCV: 94.8 fL (ref 80.0–100.0)
Monocytes Absolute: 0.3 10*3/uL (ref 0.1–1.0)
Monocytes Relative: 6 %
Neutro Abs: 4.2 10*3/uL (ref 1.7–7.7)
Neutrophils Relative %: 87 %
Platelet Count: 312 10*3/uL (ref 150–400)
RBC: 3.29 MIL/uL — ABNORMAL LOW (ref 4.22–5.81)
RDW: 13.4 % (ref 11.5–15.5)
WBC Count: 4.8 10*3/uL (ref 4.0–10.5)
nRBC: 0 % (ref 0.0–0.2)

## 2023-05-04 LAB — MISCELLANEOUS TEST

## 2023-05-04 LAB — CMP (CANCER CENTER ONLY)
ALT: 24 U/L (ref 0–44)
AST: 15 U/L (ref 15–41)
Albumin: 3.5 g/dL (ref 3.5–5.0)
Alkaline Phosphatase: 78 U/L (ref 38–126)
Anion gap: 6 (ref 5–15)
BUN: 16 mg/dL (ref 8–23)
CO2: 25 mmol/L (ref 22–32)
Calcium: 8.2 mg/dL — ABNORMAL LOW (ref 8.9–10.3)
Chloride: 104 mmol/L (ref 98–111)
Creatinine: 0.69 mg/dL (ref 0.61–1.24)
GFR, Estimated: >60 mL/min (ref >60)
Glucose, Bld: 139 mg/dL — ABNORMAL HIGH (ref 70–99)
Potassium: 4.1 mmol/L (ref 3.5–5.1)
Sodium: 135 mmol/L (ref 135–145)
Total Bilirubin: 0.4 mg/dL (ref 0.3–1.2)
Total Protein: 6.2 g/dL — ABNORMAL LOW (ref 6.5–8.1)

## 2023-05-04 LAB — GENETIC SCREENING ORDER

## 2023-05-04 MED ORDER — FLOTUFOLASTAT F 18 GALLIUM 296-5846 MBQ/ML IV SOLN
8.3900 | Freq: Once | INTRAVENOUS | Status: AC
  Administered 2023-05-04: 8.39 via INTRAVENOUS

## 2023-05-04 NOTE — Telephone Encounter (Signed)
Blood was drawn for POC genetic testing and an order was placed for [facility] CancerNext-Expanded Panel on 05/04/2023.   Lalla Brothers, MS, [facility] Genetic Counselor [email or web address] (P) [phone]

## 2023-05-05 LAB — PROSTATE-SPECIFIC AG, SERUM (LABCORP): Prostate Specific Ag, Serum: 44.3 ng/mL — ABNORMAL HIGH (ref 0.0–4.0)

## 2023-05-05 LAB — TESTOSTERONE: Testosterone: <3 ng/dL — ABNORMAL LOW (ref 264–916)

## 2023-05-06 ENCOUNTER — Encounter: Payer: Self-pay | Admitting: Radiation Oncology

## 2023-05-10 ENCOUNTER — Telehealth: Payer: Self-pay

## 2023-05-10 NOTE — Telephone Encounter (Signed)
TC from [facility] in Radiation Oncology, reporting that pt had a PET scan on 10/30, which was ordered by Dr. Cherly Hensen, but he does not have a f/u appointment scheduled w/ Dr. Cherly Hensen. Scheduling message sent to set up an OV with Dr. Cherly Hensen to review scan results.

## 2023-05-11 ENCOUNTER — Other Ambulatory Visit: Payer: Self-pay

## 2023-05-15 NOTE — Progress Notes (Unsigned)
Patient Care Team: Malachy Mood, MD as PCP - General (Hematology) Malachy Mood, MD as Attending Physician (Hematology and Oncology) Barbie Banner, MD (Family Medicine)  Clinic Day:  05/16/2023  Referring physician: Malachy Mood, MD  ASSESSMENT & PLAN:   Assessment & Plan:  67 y.o. man with history of GERD, prostate cancer being seen at Medical oncology for prostate cancer.  Current diagnosis: mCRPC Germline: performed. Pending Somatic: ATM. TP53. TMB 8.49m/MB Initial diagnosis: 2015. Gleason score 4+3 = 7 PSA of 9.1 Previous treatment:  s/p robotic assisted laparoscopic radical prostatectomy and bilateral lymph node dissection in March 2015  ADT, enzalutamide, olaparib 2015 salvage radiation with 6 months of ADT Radiation Right iliac crest and the right internal iliac lymph node, T spine  Prostate cancer (HCC) Recommend docetaxel for disease progression. Concerning for toxicities, will decrease dose to 50 mg/m every 2 weeks. Monitor PSA every cycles After that still have Pluvicto and Cabazitaxel/carbo.   Cancer related pain Refilled gabapentin 900 mg three times daily which he has been taking and tolerating well. Tylenol 1000 mg as needed up to 3 times a day   At risk for side effect of medication calcium (1000-1200 mg daily from food and supplements) and vitamin D3 (1000 IU daily) Zometa 4 mg every 3 months Continue working with physical therapy    The patient understands the plans discussed today and is in agreement with them.  He knows to contact our office if he develops concerns prior to his next appointment.   Melven Sartorius, MD  [facility]  [facility] [address] [place] [address] Dept: [phone] Dept Fax: [phone]   Orders Placed This Encounter  Procedures   CBC with Differential (Cancer Center Only)    Standing Status:   Future    Standing Expiration Date:    05/17/2024   CMP (Cancer Center only)    Standing Status:   Future    Standing Expiration Date:   05/17/2024   PSA, total and free    Standing Status:   Future    Standing Expiration Date:   05/15/2024   Testosterone    Standing Status:   Future    Standing Expiration Date:   05/15/2024   CBC with Differential (Cancer Center Only)    Standing Status:   Future    Standing Expiration Date:   05/31/2024   CMP (Cancer Center only)    Standing Status:   Future    Standing Expiration Date:   05/31/2024   PSA, total and free    Standing Status:   Future    Standing Expiration Date:   05/31/2024   Testosterone    Standing Status:   Future    Standing Expiration Date:   05/31/2024   PSA, total and free    Standing Status:   Future    Standing Expiration Date:   06/14/2024   Testosterone    Standing Status:   Future    Standing Expiration Date:   06/14/2024   CBC with Differential (Cancer Center Only)    Standing Status:   Future    Standing Expiration Date:   06/14/2024   CMP (Cancer Center only)    Standing Status:   Future    Standing Expiration Date:   06/14/2024   PSA, total and free    Standing Status:   Future    Standing Expiration Date:   06/28/2024  Testosterone    Standing Status:   Future    Standing Expiration Date:   06/28/2024   CBC with Differential (Cancer Center Only)    Standing Status:   Future    Standing Expiration Date:   06/28/2024   CMP (Cancer Center only)    Standing Status:   Future    Standing Expiration Date:   06/28/2024       INTERVAL HISTORY:  Nathan Berg is here today for repeat clinical assessment. He denies fevers or chills. He is taking advil 3-4 times a day and tylenol for pain. He is working with PT at home. He feels bilateral leg weakness is the same. He has right lower extremity swelling. No pain, redness. No chest pain taking deep breath. No short of breath or nausea, vomting. Numbness and tingling on bilateral ankle, feet up to lower leg.  No difficulty urinating. His appetite is good.  I have reviewed the past medical history, past surgical history, social history and family history with the patient and they are unchanged from previous note.  Father had prostate cancer, skin cancer and some type of HN cancer.  Past Medical History:  Diagnosis Date   Anxiety    Arthritis    Cancer (HCC)    Prostate cancer   Depression    GERD (gastroesophageal reflux disease)    H/O seasonal allergies    History of kidney stones    x1   Prostate cancer (HCC)     ALLERGIES:  is allergic to codeine and penicillin g.  MEDICATIONS:  Current Outpatient Medications  Medication Sig Dispense Refill   acetaminophen (TYLENOL) 325 MG tablet Take 1-2 tablets (325-650 mg total) by mouth every 4 (four) hours as needed for mild pain (pain score 1-3).     ALPRAZolam (XANAX) 1 MG tablet TAKE 1/2 TO 1 TABLET BY MOUTH TWICE DAILY AS NEEDED FOR ANXIETY. MUST HAVE OFFICE OR TELEMEDICINE VISIT FOR REFILLS     amantadine (SYMMETREL) 100 MG capsule Take 1 capsule (100 mg total) by mouth daily. 30 capsule 0   cyanocobalamin 1000 MCG tablet Take 1 tablet (1,000 mcg total) by mouth daily. 30 tablet 0   dexamethasone (DECADRON) 4 MG tablet Take 1 tablet (4 mg total) by mouth every 6 (six) hours. 30 tablet 0   docusate sodium (COLACE) 100 MG capsule Take 1 capsule (100 mg total) by mouth 2 (two) times daily. 60 capsule 2   enzalutamide (XTANDI) 80 MG tablet Take 2 tablets (160 mg total) by mouth daily.     escitalopram (LEXAPRO) 20 MG tablet Take 1.5 tablets (30 mg total) by mouth daily. 45 tablet 0   fentaNYL (DURAGESIC) 25 MCG/HR Place 1 patch onto the skin every 3 (three) days. 10 patch 0   gabapentin (NEURONTIN) 300 MG capsule Take 3 capsules in the morning, 3 capsules in the afternoon and 3 capsules at nighttime 270 capsule 0   ibuprofen (ADVIL) 200 MG tablet Take 800 mg by mouth as needed for mild pain or moderate pain.     lactulose (CHRONULAC) 10  GM/15ML solution Take 30 mLs (20 g total) by mouth 2 (two) times daily as needed for mild constipation, moderate constipation or severe constipation (use if inadequate response to Miralax). 236 mL 0   lidocaine (LIDODERM) 5 % Place 1 patch onto the skin daily. Remove & Discard patch within 12 hours or as directed by MD 30 patch 0   magnesium oxide (MAG-OX) 400 (240 Mg) MG tablet Take 1 tablet (  400 mg total) by mouth 2 (two) times daily. 60 tablet 0   melatonin 3 MG TABS tablet Take 1 tablet (3 mg total) by mouth at bedtime as needed (insomnia).     pantoprazole (PROTONIX) 40 MG tablet Take 1 tablet (40 mg total) by mouth 2 (two) times daily before a meal. 60 tablet 1   QUEtiapine (SEROQUEL) 100 MG tablet Take 2 tablets (200 mg total) by mouth at bedtime. 60 tablet 0   traZODone (DESYREL) 50 MG tablet Take 0.5 tablets (25 mg total) by mouth at bedtime. 30 tablet 0   No current facility-administered medications for this visit.    HISTORY OF PRESENT ILLNESS:   Oncology History Overview Note   Cancer Staging  Malignant neoplasm of prostate (HCC) Staging form: Prostate, AJCC 7th Edition - Clinical: Stage IV (TX, NX, M1, PSA: Less than 10, Gleason 8-10) - Signed by Benjiman Core, MD on 04/30/2021     Prostate cancer (HCC)  09/19/2013 Initial Diagnosis   Malignant neoplasm of prostate (HCC)   04/30/2021 Cancer Staging   Staging form: Prostate, AJCC 7th Edition - Clinical: Stage IV (TX, NX, M1, PSA: Less than 10, Gleason 8-10) - Signed by Benjiman Core, MD on 04/30/2021   03/11/2022 Imaging    IMPRESSION: 1. Multifocal intense radiotracer avid skeletal metastasis again noted involving the axillary skeleton. No change in pattern or number of lesions. No new lesions identified. 2. The radiotracer activity of the lesions; however, is uniformly increased from comparison exam. 3. The sclerotic pattern of the lesions on comparison CT is a similar. One lesion at T3 as new lytic component. 4.  No visceral metastasis.  No new nodal disease.     12/09/2022 PET scan    IMPRESSION: 1. 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 2. Metastatic skeletal lesion in the RIGHT iliac wing is increased in size compared to prior. 3. Other than the 2 above lesions, the pattern and intensity of multifocal radiotracer avid skeletal metastasis is unchanged. No new lesions identified. 4. No evidence of nodal metastasis or visceral metastasis.     03/09/2023 Tumor Marker   PSA 25   04/19/2023 - 05/02/2023 Radiation Therapy   SBRT T spine 40 Gy/5 fx   05/04/2023 PET scan   PSMA PET 1. Interval increase in size and degree of tracer uptake associated with multifocal bone metastases. 2. New tracer avid nodule within the medial right upper lobe is compatible with pulmonary metastasis. 3. Additional small scattered lung nodules are identified which are too small to characterize by PET-CT but appear new from the previous exam. 4.  Aortic Atherosclerosis (ICD10-I70.0).   05/04/2023 Tumor Marker   PSA 44.3   05/18/2023 -  Chemotherapy   Patient is on Treatment Plan : PROSTATE Docetaxel (50) + Prednisone q14d         REVIEW OF SYSTEMS:   All relevant systems were reviewed with the patient and are negative.   VITALS:  Blood pressure 102/72, pulse 86, temperature 98.4 F (36.9 C), temperature source Oral, resp. rate 17, weight 181 lb 11.2 oz (82.4 kg), SpO2 94%.  Wt Readings from Last 3 Encounters:  05/16/23 181 lb 11.2 oz (82.4 kg)  04/13/23 191 lb 9.3 oz (86.9 kg)  04/13/23 197 lb 1.5 oz (89.4 kg)    Body mass index is 26.07 kg/m.  Performance status (ECOG): 2 - Symptomatic, <50% confined to bed  PHYSICAL EXAM:   GENERAL:alert, no distress  and comfortable SKIN: skin color normal, no rashes  EYES: normal, sclera clear OROPHARYNX: no exudate, no erythema    NECK: supple,  non-tender, without nodularity LYMPH:   no palpable cervical lymphadenopathy LUNGS: clear to auscultation with normal breathing effort.  No wheeze or rales HEART: regular rate & rhythm and no murmurs and no lower extremity edema ABDOMEN: abdomen soft, non-tender and nondistended Musculoskeletal: no edema NEURO: alert, fluent speech, no focal motor/sensory deficits.  Strength and sensation equal bilaterally.  LABORATORY DATA:  I have reviewed the data as listed    Component Value Date/Time   NA 135 05/04/2023 1224   K 4.1 05/04/2023 1224   CL 104 05/04/2023 1224   CO2 25 05/04/2023 1224   GLUCOSE 139 (H) 05/04/2023 1224   BUN 16 05/04/2023 1224   CREATININE 0.69 05/04/2023 1224   CALCIUM 8.2 (L) 05/04/2023 1224   PROT 6.2 (L) 05/04/2023 1224   ALBUMIN 3.5 05/04/2023 1224   AST 15 05/04/2023 1224   ALT 24 05/04/2023 1224   ALKPHOS 78 05/04/2023 1224   BILITOT 0.4 05/04/2023 1224   GFRNONAA >60 05/04/2023 1224   GFRAA >60 07/17/2019 0322    No results found for: "SPEP", "UPEP"  Lab Results  Component Value Date   WBC 4.8 05/04/2023   NEUTROABS 4.2 05/04/2023   HGB 10.1 (L) 05/04/2023   HCT 31.2 (L) 05/04/2023   MCV 94.8 05/04/2023   PLT 312 05/04/2023      Chemistry      Component Value Date/Time   NA 135 05/04/2023 1224   K 4.1 05/04/2023 1224   CL 104 05/04/2023 1224   CO2 25 05/04/2023 1224   BUN 16 05/04/2023 1224   CREATININE 0.69 05/04/2023 1224      Component Value Date/Time   CALCIUM 8.2 (L) 05/04/2023 1224   ALKPHOS 78 05/04/2023 1224   AST 15 05/04/2023 1224   ALT 24 05/04/2023 1224   BILITOT 0.4 05/04/2023 1224       RADIOGRAPHIC STUDIES: I have personally reviewed the radiological images as listed and agreed with the findings in the report. NM PET (PSMA) SKULL TO MID THIGH  Result Date: 05/12/2023 CLINICAL DATA:  Subsequent treatment strategy for prostate cancer. EXAM: NUCLEAR MEDICINE PET SKULL BASE TO THIGH TECHNIQUE: 8.39 mCi Flotufolastat (Posluma) was injected intravenously.  Full-ring PET imaging was performed from the skull base to thigh after the radiotracer. CT data was obtained and used for attenuation correction and anatomic localization. COMPARISON:  12/09/2022 FINDINGS: NECK No radiotracer activity in neck lymph nodes. Incidental CT finding: None. CHEST No tracer avid axillary, supraclavicular, mediastinal or hilar lymph nodes. Within the medial right upper lobe there is a tracer avid nodule measuring 1.9 cm with SUV max of 7.73, image 61 of the fused PET-CT images. New compared with the previous exam. Additional small scattered lung nodule are identified which are too small to characterize by PET-CT but appear new from the previous exam. For example: -New 4 mm nodule within the anterior lingula, image 32/7. -New posterior left lower lobe measuring 4 mm, image 45/7. -new 5 mm nodule is noted within the posteromedial right base, image 49/7. Incidental CT finding: Aortic atherosclerosis and coronary artery calcifications. Moderate to large hiatal hernia. ABDOMEN/PELVIS Prostate: No focal activity in the prostate bed. Lymph nodes: No abnormal radiotracer accumulation within pelvic or abdominal nodes. Liver: No evidence of liver metastasis. Incidental CT finding: Nonobstructing inferior pole left renal calculus. Moderate stool burden throughout the colon is noted. Aortic atherosclerotic  calcifications. SKELETON Multifocal tracer avid bone metastases are again identified. Compared with the previous exam index lesions appear increased in size and degree of tracer uptake. Index lesions include: -right inferior pubic ramus lesion has an SUV max 142, image 197. Formally 109. -Index lesion within the left sacral wing has an SUV max of 106, image 158. Formally 62. -Index lesion within the right iliac wing is increased in size with SUV max of 47.8, image 150. Previously 25. -Index lesion within the T3 vertebral body has an SUV max 9.9, image 59. Formally 5.72. -Index lesion within the  anteromedial right first rib has an SUV max of 93.59. Formally 19.97. IMPRESSION: 1. Interval increase in size and degree of tracer uptake associated with multifocal bone metastases. 2. New tracer avid nodule within the medial right upper lobe is compatible with pulmonary metastasis. 3. Additional small scattered lung nodules are identified which are too small to characterize by PET-CT but appear new from the previous exam. 4.  Aortic Atherosclerosis (ICD10-I70.0). Electronically Signed   By: Signa Kell M.D.   On: 05/12/2023 13:45

## 2023-05-15 NOTE — Assessment & Plan Note (Signed)
Recommend docetaxel for disease progression. After that still have Pluvicto and Cabazitaxel.

## 2023-05-16 ENCOUNTER — Inpatient Hospital Stay: Payer: Medicare Other | Attending: Oncology

## 2023-05-16 VITALS — BP 102/72 | HR 86 | Temp 98.4°F | Resp 17 | Wt 181.7 lb

## 2023-05-16 DIAGNOSIS — D701 Agranulocytosis secondary to cancer chemotherapy: Secondary | ICD-10-CM | POA: Insufficient documentation

## 2023-05-16 DIAGNOSIS — K5900 Constipation, unspecified: Secondary | ICD-10-CM | POA: Diagnosis not present

## 2023-05-16 DIAGNOSIS — G893 Neoplasm related pain (acute) (chronic): Secondary | ICD-10-CM | POA: Insufficient documentation

## 2023-05-16 DIAGNOSIS — Z9189 Other specified personal risk factors, not elsewhere classified: Secondary | ICD-10-CM | POA: Insufficient documentation

## 2023-05-16 DIAGNOSIS — R918 Other nonspecific abnormal finding of lung field: Secondary | ICD-10-CM | POA: Diagnosis not present

## 2023-05-16 DIAGNOSIS — C7951 Secondary malignant neoplasm of bone: Secondary | ICD-10-CM | POA: Insufficient documentation

## 2023-05-16 DIAGNOSIS — G629 Polyneuropathy, unspecified: Secondary | ICD-10-CM | POA: Diagnosis not present

## 2023-05-16 DIAGNOSIS — C61 Malignant neoplasm of prostate: Secondary | ICD-10-CM | POA: Diagnosis present

## 2023-05-16 DIAGNOSIS — I700 Atherosclerosis of aorta: Secondary | ICD-10-CM | POA: Insufficient documentation

## 2023-05-16 DIAGNOSIS — Z5111 Encounter for antineoplastic chemotherapy: Secondary | ICD-10-CM | POA: Insufficient documentation

## 2023-05-16 MED ORDER — FENTANYL 25 MCG/HR TD PT72: 1.0000 | MEDICATED_PATCH | TRANSDERMAL | 0 refills | Status: DC

## 2023-05-16 MED ORDER — OXYCODONE HCL 5 MG PO TABS: 5.0000 mg | ORAL_TABLET | Freq: Three times a day (TID) | ORAL | 0 refills | Status: DC | PRN

## 2023-05-16 MED ORDER — GABAPENTIN 300 MG PO CAPS: ORAL_CAPSULE | ORAL | 0 refills | Status: DC

## 2023-05-16 NOTE — Assessment & Plan Note (Signed)
Refilled gabapentin 900 mg three times daily which he has been taking and tolerating well. Tylenol 1000 mg as needed up to 3 times a day

## 2023-05-16 NOTE — Assessment & Plan Note (Signed)
calcium (1000-1200 mg daily from food and supplements) and vitamin D3 (1000 IU daily) Zometa 4 mg every 3 months Continue working with physical therapy

## 2023-05-16 NOTE — Progress Notes (Signed)
START ON PATHWAY REGIMEN - Prostate ? ? ?  A cycle is every 21 days: ?    Prednisone  ?    Docetaxel  ? ?**Always confirm dose/schedule in your pharmacy ordering system** ? 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 ?Intent of Therapy: ?Non-Curative / Palliative Intent, Discussed with Patient ?

## 2023-05-17 ENCOUNTER — Other Ambulatory Visit: Payer: Self-pay

## 2023-05-17 NOTE — Progress Notes (Signed)
Therapy changed to IV docetaxel. Confirmed with Katie. Disenrolled patient.

## 2023-05-18 ENCOUNTER — Inpatient Hospital Stay: Payer: Medicare Other

## 2023-05-18 VITALS — BP 130/79 | HR 72 | Temp 98.6°F | Resp 13

## 2023-05-18 DIAGNOSIS — Z5111 Encounter for antineoplastic chemotherapy: Secondary | ICD-10-CM | POA: Diagnosis not present

## 2023-05-18 DIAGNOSIS — C61 Malignant neoplasm of prostate: Secondary | ICD-10-CM

## 2023-05-18 LAB — CBC WITH DIFFERENTIAL (CANCER CENTER ONLY)
Abs Immature Granulocytes: 0.03 10*3/uL (ref 0.00–0.07)
Basophils Absolute: 0 10*3/uL (ref 0.0–0.1)
Basophils Relative: 1 %
Eosinophils Absolute: 0.2 10*3/uL (ref 0.0–0.5)
Eosinophils Relative: 7 %
HCT: 29 % — ABNORMAL LOW (ref 39.0–52.0)
Hemoglobin: 9.2 g/dL — ABNORMAL LOW (ref 13.0–17.0)
Immature Granulocytes: 1 %
Lymphocytes Relative: 18 %
Lymphs Abs: 0.5 10*3/uL — ABNORMAL LOW (ref 0.7–4.0)
MCH: 29.1 pg (ref 26.0–34.0)
MCHC: 31.7 g/dL (ref 30.0–36.0)
MCV: 91.8 fL (ref 80.0–100.0)
Monocytes Absolute: 0.2 10*3/uL (ref 0.1–1.0)
Monocytes Relative: 8 %
Neutro Abs: 1.7 10*3/uL (ref 1.7–7.7)
Neutrophils Relative %: 65 %
Platelet Count: 257 10*3/uL (ref 150–400)
RBC: 3.16 MIL/uL — ABNORMAL LOW (ref 4.22–5.81)
RDW: 13.6 % (ref 11.5–15.5)
WBC Count: 2.6 10*3/uL — ABNORMAL LOW (ref 4.0–10.5)
nRBC: 0 % (ref 0.0–0.2)

## 2023-05-18 LAB — CMP (CANCER CENTER ONLY)
ALT: 15 U/L (ref 0–44)
AST: 13 U/L — ABNORMAL LOW (ref 15–41)
Albumin: 2.9 g/dL — ABNORMAL LOW (ref 3.5–5.0)
Alkaline Phosphatase: 78 U/L (ref 38–126)
Anion gap: 5 (ref 5–15)
BUN: 17 mg/dL (ref 8–23)
CO2: 29 mmol/L (ref 22–32)
Calcium: 8.3 mg/dL — ABNORMAL LOW (ref 8.9–10.3)
Chloride: 108 mmol/L (ref 98–111)
Creatinine: 0.6 mg/dL — ABNORMAL LOW (ref 0.61–1.24)
GFR, Estimated: >60 mL/min (ref >60)
Glucose, Bld: 81 mg/dL (ref 70–99)
Potassium: 3.5 mmol/L (ref 3.5–5.1)
Sodium: 142 mmol/L (ref 135–145)
Total Bilirubin: 0.4 mg/dL (ref <1.2)
Total Protein: 5.8 g/dL — ABNORMAL LOW (ref 6.5–8.1)

## 2023-05-18 MED ORDER — DEXAMETHASONE SODIUM PHOSPHATE 10 MG/ML IJ SOLN
10.0000 mg | Freq: Once | INTRAMUSCULAR | Status: AC
  Administered 2023-05-18: 10 mg via INTRAVENOUS
  Filled 2023-05-18: qty 1

## 2023-05-18 MED ORDER — SODIUM CHLORIDE 0.9 % IV SOLN
INTRAVENOUS | Status: DC
Start: 2023-05-18 — End: 2023-05-18

## 2023-05-18 MED ORDER — DOCETAXEL CHEMO INJECTION 160 MG/16ML
50.0000 mg/m2 | Freq: Once | INTRAVENOUS | Status: AC
  Administered 2023-05-18: 101 mg via INTRAVENOUS
  Filled 2023-05-18: qty 10.1

## 2023-05-18 NOTE — Patient Instructions (Signed)
 [facility]  Discharge Instructions: Thank you for choosing [facility] to provide your oncology and hematology care.   If you have a lab appointment with the Cancer Center, please go directly to the Cancer Center and check in at the registration area.   Wear comfortable clothing and clothing appropriate for easy access to any Portacath or PICC line.   We strive to give you quality time with your provider. You may need to reschedule your appointment if you arrive late (15 or more minutes).  Arriving late affects you and other patients whose appointments are after yours.  Also, if you miss three or more appointments without notifying the office, you may be dismissed from the clinic at the provider's discretion.      For prescription refill requests, have your pharmacy contact our office and allow 72 hours for refills to be completed.    Today you received the following chemotherapy and/or immunotherapy agents: Docetaxel      To help prevent nausea and vomiting after your treatment, we encourage you to take your nausea medication as directed.  BELOW ARE SYMPTOMS THAT SHOULD BE REPORTED IMMEDIATELY: *FEVER GREATER THAN 100.4 F (38 C) OR HIGHER *CHILLS OR SWEATING *NAUSEA AND VOMITING THAT IS NOT CONTROLLED WITH YOUR NAUSEA MEDICATION *UNUSUAL SHORTNESS OF BREATH *UNUSUAL BRUISING OR BLEEDING *URINARY PROBLEMS (pain or burning when urinating, or frequent urination) *BOWEL PROBLEMS (unusual diarrhea, constipation, pain near the anus) TENDERNESS IN MOUTH AND THROAT WITH OR WITHOUT PRESENCE OF ULCERS (sore throat, sores in mouth, or a toothache) UNUSUAL RASH, SWELLING OR PAIN  UNUSUAL VAGINAL DISCHARGE OR ITCHING   Items with * indicate a potential emergency and should be followed up as soon as possible or go to the Emergency Department if any problems should occur.  Please show the CHEMOTHERAPY ALERT CARD or IMMUNOTHERAPY  ALERT CARD at check-in to the Emergency Department and triage nurse.  Should you have questions after your visit or need to cancel or reschedule your appointment, please contact [facility] - A DEPT OF [facility]  Dept: [phone]  and follow the prompts.  Office hours are 8:00 a.m. to 4:30 p.m. Monday - Friday. Please note that voicemails left after 4:00 p.m. may not be returned until the following business day.  We are closed weekends and major holidays. You have access to a nurse at all times for urgent questions. Please call the main number to the clinic Dept: [phone] and follow the prompts.   For any non-urgent questions, you may also contact your provider using MyChart. We now offer e-Visits for anyone 64 and older to request care online for non-urgent symptoms. For details visit [email or web address].   Also download the MyChart app! Go to the app store, search "MyChart", open the app, select , and log in with your MyChart username and password.

## 2023-05-18 NOTE — Progress Notes (Signed)
Patient informed this RN that he did not take Dexamethasone prior to treatment today. This RN reached out to Dr Cherly Hensen, he stated okay to proceed with treatment today as ordered.

## 2023-05-19 ENCOUNTER — Telehealth: Payer: Self-pay

## 2023-05-19 LAB — PSA, TOTAL AND FREE
PSA, Free Pct: 7.1 %
PSA, Free: 2.5 ng/mL
Prostate Specific Ag, Serum: 35.4 ng/mL — ABNORMAL HIGH (ref 0.0–4.0)

## 2023-05-19 LAB — TESTOSTERONE: Testosterone: <3 ng/dL — ABNORMAL LOW (ref 264–916)

## 2023-05-19 NOTE — Telephone Encounter (Signed)
-----   Message from Nurse Cortney P sent at 05/18/2023  4:40 PM EST ----- Regarding: Dr Cherly Hensen - First time [facility] Dr Cherly Hensen, first time [facility]. Patient tolerated treatment well. Patient did state while in infusion chair that he did not take his Dexamethasone pre-treatment, per Dr Cherly Hensen, okay to proceed. Patient stated that the pharmacy did not have the script when he went to pick it up.

## 2023-05-19 NOTE — Telephone Encounter (Signed)
LM for patient that this nurse was calling to see how they were doing after their treatment. Please call back to Dr. Nelta Numbers nurse at [phone] if they have any questions or concerns regarding the treatment.

## 2023-05-27 ENCOUNTER — Encounter: Payer: Self-pay | Admitting: Genetic Counselor

## 2023-05-27 DIAGNOSIS — Z1379 Encounter for other screening for genetic and chromosomal anomalies: Secondary | ICD-10-CM | POA: Insufficient documentation

## 2023-05-27 NOTE — Addendum Note (Signed)
Encounter addended by: Marcello Fennel, PA-C on: 05/27/2023 3:53 PM  Actions taken: Clinical Note Signed

## 2023-05-27 NOTE — Addendum Note (Signed)
Encounter addended by: Marcello Fennel, PA-C on: 05/27/2023 3:54 PM  Actions taken: Clinical Note Signed

## 2023-05-30 NOTE — Progress Notes (Unsigned)
Patient Care Team: Malachy Mood, MD as PCP - General (Hematology) Malachy Mood, MD as Attending Physician (Hematology and Oncology) Barbie Banner, MD (Family Medicine)  Clinic Day:  05/31/2023  Referring physician: Melven Sartorius, MD  ASSESSMENT & PLAN:   67 y.o. man with history of GERD, prostate cancer being seen at Medical oncology for prostate cancer.   Current diagnosis: mCRPC Germline: performed. Pending Somatic: ATM. TP53. TMB 8.25m/MB Initial diagnosis: 2015. Gleason score 4+3 = 7 PSA of 9.1 Previous treatment:  s/p robotic assisted laparoscopic radical prostatectomy and bilateral lymph node dissection in March 2015  ADT, enzalutamide, olaparib 2015 salvage radiation with 6 months of ADT Radiation Right iliac crest and the right internal iliac lymph node, T spine Current treatment: docetaxel every 2 weeks   Significant toxicities from docetaxel despite a low dose.  He has worsening fatigue, neutropenia, weakness, neuropathy, constipation.  Will discontinue treatment.  Will refer for Pluvicto.  He will follow-up with me monthly after starting Pluvicto.  Prostate cancer (HCC) stop docetaxel due to toxicities Refer for Pluvicto Monitor PSA monthly Last lupron was on 10/16 at 30 mg. Will resume in Feb Last zometa on 10/16. Will resume in January  Cancer related pain Refilled gabapentin 900 mg 2 times daily which he has been taking and tolerating well. Fentanyl patch 25 mcg. Refilled today  Genetic testing [facility] CancerNext-Expanded Panel+RNA was Negative. Report date is 05/26/2023.   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  He knows to contact our office if he develops concerns prior to his next appointment.  Melven Sartorius, MD  [facility] - A [facility] OF [facility] [address] [place] [address] Dept: [phone] Dept Fax: [phone]   Orders Placed This Encounter  Procedures   NM Radiologist Eval And Mgmt    Standing Status:   Future    Standing Expiration Date:   05/30/2024    Scheduling Instructions:     To Dr. Roseanne Reno for Pluvicto    Order Specific Question:   Reason for Exam (SYMPTOM  OR DIAGNOSIS REQUIRED)    Answer:   mCRPC for Pluvicto    Order Specific Question:   If indicated for the ordered procedure, I authorize the administration of a radiopharmaceutical per Radiology protocol    Answer:   Yes    Order Specific Question:   Preferred imaging location?    Answer:   [facility]      CHIEF COMPLAINT:  CC: Prostate cancer  Current Treatment:  docetaxel  INTERVAL HISTORY:  Nathan Berg is here today for repeat clinical assessment. He denies fevers or chills.  He reports pain over the upper chest wall area is about the same.  Not much improvement after chemotherapy.  His appetite is good.  He has more fatigue after chemotherapy and feeling weak. Leg swelling absinthol.  Improved this morning. GI: No nausea or vomiting.  He has constipation. Overall he did not tolerate chemotherapy  well.  Report he really felt worse after few days.  I have reviewed the past medical history, past surgical history, social history and family history with the patient and they are unchanged from previous note.  ALLERGIES:  is allergic to codeine and penicillin g.  MEDICATIONS:  Current Outpatient Medications  Medication Sig Dispense Refill    fentaNYL (DURAGESIC) 50 MCG/HR Place 1 patch onto the skin every 3 (three) days. 10 patch 0   HYDROcodone-acetaminophen (NORCO) 5-325 MG tablet Take 1 tablet by mouth every 6 (six) hours as needed for moderate pain (pain score 4-6). 60 tablet 0   ALPRAZolam (XANAX) 1 MG tablet TAKE 1/2 TO 1 TABLET BY MOUTH TWICE DAILY AS NEEDED FOR ANXIETY. MUST HAVE OFFICE OR TELEMEDICINE VISIT FOR REFILLS     amantadine (SYMMETREL) 100 MG capsule Take 1 capsule (100 mg total) by mouth daily. 30 capsule 0   cyanocobalamin 1000 MCG tablet Take 1 tablet (1,000 mcg total) by mouth daily. 30 tablet 0   dexamethasone (DECADRON) 4 MG tablet Take 1 tablet (4 mg total) by mouth every 6 (six) hours. 30 tablet 0   docusate sodium (COLACE) 100 MG capsule Take 1 capsule (100 mg total) by mouth 2 (two) times daily. 60 capsule 2   escitalopram (LEXAPRO) 20 MG tablet Take 1.5 tablets (30 mg total) by mouth daily. 45 tablet 0   gabapentin (NEURONTIN) 300 MG capsule Take 3 capsules (900 mg total) by mouth 2 (two) times daily. 270 capsule 0   ibuprofen (ADVIL) 200 MG tablet Take 800 mg by mouth as needed for mild pain or moderate pain.     lactulose (CHRONULAC) 10 GM/15ML solution Take 30 mLs (20 g total) by mouth 2 (two) times daily as needed for mild constipation, moderate constipation or severe constipation (use if inadequate response to Miralax). 236 mL 0   lidocaine (LIDODERM) 5 % Place 1 patch onto the skin daily. Remove & Discard patch within 12 hours or as directed by MD 30 patch 0   melatonin 3 MG TABS tablet Take 1 tablet (3 mg total) by mouth at bedtime as needed (insomnia).     pantoprazole (PROTONIX) 40 MG tablet Take 1 tablet (40 mg total) by mouth 2 (two) times daily before a meal. 60 tablet 1   QUEtiapine (SEROQUEL) 100 MG tablet Take 2 tablets (200 mg total) by mouth at bedtime. 60 tablet 0   No current facility-administered medications for this visit.    HISTORY OF PRESENT ILLNESS:   Oncology History Overview  Note   Cancer Staging  Malignant neoplasm of prostate (HCC) Staging form: Prostate, AJCC 7th Edition - Clinical: Stage IV (TX, NX, M1, PSA: Less than 10, Gleason 8-10) - Signed by Benjiman Core, MD on 04/30/2021     Prostate cancer (HCC)  09/19/2013 Initial Diagnosis   Malignant neoplasm of prostate (HCC)   04/30/2021 Cancer Staging   Staging form: Prostate, AJCC 7th Edition - Clinical: Stage IV (TX, NX, M1, PSA: Less than 10, Gleason 8-10) - Signed by Benjiman Core, MD on 04/30/2021   03/11/2022 Imaging    IMPRESSION: 1. Multifocal intense radiotracer avid skeletal metastasis again noted involving the axillary skeleton. No change in pattern or number of lesions. No new lesions identified. 2. The radiotracer activity of the lesions; however, is uniformly increased from comparison exam. 3. The sclerotic pattern of the lesions on comparison CT is a similar. One lesion at T3 as new lytic component. 4. No visceral metastasis.  No  new nodal disease.     12/09/2022 PET scan    IMPRESSION: 1. 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 2. Metastatic skeletal lesion in the RIGHT iliac wing is increased in size compared to prior. 3. Other than the 2 above lesions, the pattern and intensity of multifocal radiotracer avid skeletal metastasis is unchanged. No new lesions identified. 4. No evidence of nodal metastasis or visceral metastasis.     03/09/2023 Tumor Marker   PSA 25   04/19/2023 - 05/02/2023 Radiation Therapy   SBRT T spine 40 Gy/5 fx   05/04/2023 PET scan   PSMA PET 1. Interval increase in size and degree of tracer uptake associated with multifocal bone metastases. 2. New tracer avid nodule within the medial right upper lobe is compatible with pulmonary metastasis. 3. Additional small scattered lung nodules are identified which are too small to characterize by PET-CT but appear new from the  previous exam. 4.  Aortic Atherosclerosis (ICD10-I70.0).   05/04/2023 Tumor Marker   PSA 44.3   05/18/2023 - 05/18/2023 Chemotherapy   Patient is on Treatment Plan : PROSTATE Docetaxel (50) + Prednisone q14d      Genetic Testing   [facility] CancerNext-Expanded Panel+RNA was Negative. Report date is 05/26/2023.   The CancerNext-Expanded gene panel offered by [facility] and includes sequencing, rearrangement, and RNA analysis for the following 71 genes: AIP, ALK, APC, ATM, AXIN2, BAP1, BARD1, BMPR1A, BRCA1, BRCA2, BRIP1, CDC73, CDH1, CDK4, CDKN1B, CDKN2A, CHEK2, CTNNA1, DICER1, FH, FLCN, KIF1B, LZTR1, MAX, MEN1, MET, MLH1, MSH2, MSH3, MSH6, MUTYH, NF1, NF2, NTHL1, PALB2, PHOX2B, PMS2, POT1, PRKAR1A, PTCH1, PTEN, RAD51C, RAD51D, RB1, RET, SDHA, SDHAF2, SDHB, SDHC, SDHD, SMAD4, SMARCA4, SMARCB1, SMARCE1, STK11, SUFU, TMEM127, TP53, TSC1, TSC2, and VHL (sequencing and deletion/duplication); EGFR, EGLN1, HOXB13, KIT, MITF, PDGFRA, POLD1, and POLE (sequencing only); EPCAM and GREM1 (deletion/duplication only).         REVIEW OF SYSTEMS:   All relevant systems were reviewed with the patient and are negative.   VITALS:  Blood pressure 100/67, pulse 76, temperature (!) 97.3 F (36.3 C), resp. rate 18, weight 186 lb 3.2 oz (84.5 kg), SpO2 100%.  Wt Readings from Last 3 Encounters:  05/31/23 186 lb 3.2 oz (84.5 kg)  05/16/23 181 lb 11.2 oz (82.4 kg)  04/13/23 191 lb 9.3 oz (86.9 kg)    Body mass index is 26.72 kg/m.  Performance status (ECOG): 2 - Symptomatic, <50% confined to bed  PHYSICAL EXAM:   GENERAL:alert, no distress and on wheelchair SKIN: skin color pale. EYES: normal, sclera clear LUNGS: clear to auscultation with normal breathing effort.  No wheeze or rales HEART: regular rate & rhythm and no murmurs and no lower extremity edema ABDOMEN: abdomen soft, non-tender and nondistended Musculoskeletal: no edema NEURO: alert  LABORATORY DATA:  I have reviewed the data as  listed    Component Value Date/Time   NA 140 05/31/2023 1022   K 3.5 05/31/2023 1022   CL 106 05/31/2023 1022   CO2 30 05/31/2023 1022   GLUCOSE 86 05/31/2023 1022   BUN 15 05/31/2023 1022   CREATININE 0.77 05/31/2023 1022   CALCIUM 8.3 (L) 05/31/2023 1022   PROT 5.9 (L) 05/31/2023 1022   ALBUMIN 2.9 (L) 05/31/2023 1022   AST 12 (L) 05/31/2023 1022   ALT 10 05/31/2023 1022   ALKPHOS 94 05/31/2023 1022   BILITOT 0.4 05/31/2023 1022   GFRNONAA >60 05/31/2023 1022   GFRAA >60 07/17/2019 0322  No results found for: "SPEP", "UPEP"  Lab Results  Component Value Date   WBC 1.4 (L) 05/31/2023   NEUTROABS 0.5 (L) 05/31/2023   HGB 8.8 (L) 05/31/2023   HCT 28.8 (L) 05/31/2023   MCV 90.3 05/31/2023   PLT 309 05/31/2023      Chemistry      Component Value Date/Time   NA 140 05/31/2023 1022   K 3.5 05/31/2023 1022   CL 106 05/31/2023 1022   CO2 30 05/31/2023 1022   BUN 15 05/31/2023 1022   CREATININE 0.77 05/31/2023 1022      Component Value Date/Time   CALCIUM 8.3 (L) 05/31/2023 1022   ALKPHOS 94 05/31/2023 1022   AST 12 (L) 05/31/2023 1022   ALT 10 05/31/2023 1022   BILITOT 0.4 05/31/2023 1022       RADIOGRAPHIC STUDIES: I have personally reviewed the radiological images as listed and agreed with the findings in the report. NM PET (PSMA) SKULL TO MID THIGH  Result Date: 05/12/2023 CLINICAL DATA:  Subsequent treatment strategy for prostate cancer. EXAM: NUCLEAR MEDICINE PET SKULL BASE TO THIGH TECHNIQUE: 8.39 mCi Flotufolastat (Posluma) was injected intravenously. Full-ring PET imaging was performed from the skull base to thigh after the radiotracer. CT data was obtained and used for attenuation correction and anatomic localization. COMPARISON:  12/09/2022 FINDINGS: NECK No radiotracer activity in neck lymph nodes. Incidental CT finding: None. CHEST No tracer avid axillary, supraclavicular, mediastinal or hilar lymph nodes. Within the medial right upper lobe there is a  tracer avid nodule measuring 1.9 cm with SUV max of 7.73, image 61 of the fused PET-CT images. New compared with the previous exam. Additional small scattered lung nodule are identified which are too small to characterize by PET-CT but appear new from the previous exam. For example: -New 4 mm nodule within the anterior lingula, image 32/7. -New posterior left lower lobe measuring 4 mm, image 45/7. -new 5 mm nodule is noted within the posteromedial right base, image 49/7. Incidental CT finding: Aortic atherosclerosis and coronary artery calcifications. Moderate to large hiatal hernia. ABDOMEN/PELVIS Prostate: No focal activity in the prostate bed. Lymph nodes: No abnormal radiotracer accumulation within pelvic or abdominal nodes. Liver: No evidence of liver metastasis. Incidental CT finding: Nonobstructing inferior pole left renal calculus. Moderate stool burden throughout the colon is noted. Aortic atherosclerotic calcifications. SKELETON Multifocal tracer avid bone metastases are again identified. Compared with the previous exam index lesions appear increased in size and degree of tracer uptake. Index lesions include: -right inferior pubic ramus lesion has an SUV max 142, image 197. Formally 109. -Index lesion within the left sacral wing has an SUV max of 106, image 158. Formally 62. -Index lesion within the right iliac wing is increased in size with SUV max of 47.8, image 150. Previously 25. -Index lesion within the T3 vertebral body has an SUV max 9.9, image 59. Formally 5.72. -Index lesion within the anteromedial right first rib has an SUV max of 93.59. Formally 19.97. IMPRESSION: 1. Interval increase in size and degree of tracer uptake associated with multifocal bone metastases. 2. New tracer avid nodule within the medial right upper lobe is compatible with pulmonary metastasis. 3. Additional small scattered lung nodules are identified which are too small to characterize by PET-CT but appear new from the  previous exam. 4.  Aortic Atherosclerosis (ICD10-I70.0). Electronically Signed   By: Signa Kell M.D.   On: 05/12/2023 13:45

## 2023-05-30 NOTE — Assessment & Plan Note (Signed)
calcium (1000-1200 mg daily from food and supplements) and vitamin D3 (1000 IU daily) Zometa 4 mg every 3 months Routine dental care Continue working with physical therapy

## 2023-05-30 NOTE — Assessment & Plan Note (Signed)
B12 500 mcg dailiy

## 2023-05-30 NOTE — Assessment & Plan Note (Signed)
stop docetaxel due to toxicities Refer for Pluvicto Monitor PSA monthly Last lupron was on 10/16 at 30 mg. Will resume in Feb Last zometa on 10/16. Will resume in January

## 2023-05-30 NOTE — Assessment & Plan Note (Signed)
Refilled gabapentin 900 mg three times daily which he has been taking and tolerating well. Tylenol 1000 mg as needed up to 3 times a day

## 2023-05-30 NOTE — Assessment & Plan Note (Signed)
[facility] CancerNext-Expanded Panel+RNA was Negative. Report date is 05/26/2023.

## 2023-05-31 ENCOUNTER — Inpatient Hospital Stay: Payer: Medicare Other

## 2023-05-31 ENCOUNTER — Inpatient Hospital Stay (HOSPITAL_BASED_OUTPATIENT_CLINIC_OR_DEPARTMENT_OTHER): Payer: Medicare Other

## 2023-05-31 ENCOUNTER — Ambulatory Visit
Admit: 2023-05-31 | Discharge: 2023-05-31 | Disposition: A | Discharge status: Home / Self Care | Payer: Medicare Other | Attending: Hematology | Admitting: Hematology

## 2023-05-31 VITALS — BP 100/67 | HR 76 | Temp 97.3°F | Resp 18 | Wt 186.2 lb

## 2023-05-31 DIAGNOSIS — G893 Neoplasm related pain (acute) (chronic): Secondary | ICD-10-CM | POA: Diagnosis not present

## 2023-05-31 DIAGNOSIS — Z1379 Encounter for other screening for genetic and chromosomal anomalies: Secondary | ICD-10-CM | POA: Diagnosis not present

## 2023-05-31 DIAGNOSIS — D702 Other drug-induced agranulocytosis: Secondary | ICD-10-CM | POA: Insufficient documentation

## 2023-05-31 DIAGNOSIS — Z9189 Other specified personal risk factors, not elsewhere classified: Secondary | ICD-10-CM | POA: Diagnosis not present

## 2023-05-31 DIAGNOSIS — G629 Polyneuropathy, unspecified: Secondary | ICD-10-CM | POA: Insufficient documentation

## 2023-05-31 DIAGNOSIS — E538 Deficiency of other specified B group vitamins: Secondary | ICD-10-CM

## 2023-05-31 DIAGNOSIS — C61 Malignant neoplasm of prostate: Secondary | ICD-10-CM | POA: Diagnosis not present

## 2023-05-31 DIAGNOSIS — K5900 Constipation, unspecified: Secondary | ICD-10-CM | POA: Insufficient documentation

## 2023-05-31 DIAGNOSIS — Z5111 Encounter for antineoplastic chemotherapy: Secondary | ICD-10-CM | POA: Diagnosis not present

## 2023-05-31 DIAGNOSIS — K5909 Other constipation: Secondary | ICD-10-CM

## 2023-05-31 LAB — CBC WITH DIFFERENTIAL (CANCER CENTER ONLY)
Abs Immature Granulocytes: 0.02 10*3/uL (ref 0.00–0.07)
Basophils Absolute: 0 10*3/uL (ref 0.0–0.1)
Basophils Relative: 2 %
Eosinophils Absolute: 0 10*3/uL (ref 0.0–0.5)
Eosinophils Relative: 1 %
HCT: 28.8 % — ABNORMAL LOW (ref 39.0–52.0)
Hemoglobin: 8.8 g/dL — ABNORMAL LOW (ref 13.0–17.0)
Immature Granulocytes: 2 %
Lymphocytes Relative: 33 %
Lymphs Abs: 0.5 10*3/uL — ABNORMAL LOW (ref 0.7–4.0)
MCH: 27.6 pg (ref 26.0–34.0)
MCHC: 30.6 g/dL (ref 30.0–36.0)
MCV: 90.3 fL (ref 80.0–100.0)
Monocytes Absolute: 0.4 10*3/uL (ref 0.1–1.0)
Monocytes Relative: 29 %
Neutro Abs: 0.5 10*3/uL — ABNORMAL LOW (ref 1.7–7.7)
Neutrophils Relative %: 33 %
Platelet Count: 309 10*3/uL (ref 150–400)
RBC: 3.19 MIL/uL — ABNORMAL LOW (ref 4.22–5.81)
RDW: 14.1 % (ref 11.5–15.5)
WBC Count: 1.4 10*3/uL — ABNORMAL LOW (ref 4.0–10.5)
nRBC: 1.5 % — ABNORMAL HIGH (ref 0.0–0.2)

## 2023-05-31 LAB — CMP (CANCER CENTER ONLY)
ALT: 10 U/L (ref 0–44)
AST: 12 U/L — ABNORMAL LOW (ref 15–41)
Albumin: 2.9 g/dL — ABNORMAL LOW (ref 3.5–5.0)
Alkaline Phosphatase: 94 U/L (ref 38–126)
Anion gap: 4 — ABNORMAL LOW (ref 5–15)
BUN: 15 mg/dL (ref 8–23)
CO2: 30 mmol/L (ref 22–32)
Calcium: 8.3 mg/dL — ABNORMAL LOW (ref 8.9–10.3)
Chloride: 106 mmol/L (ref 98–111)
Creatinine: 0.77 mg/dL (ref 0.61–1.24)
GFR, Estimated: >60 mL/min (ref >60)
Glucose, Bld: 86 mg/dL (ref 70–99)
Potassium: 3.5 mmol/L (ref 3.5–5.1)
Sodium: 140 mmol/L (ref 135–145)
Total Bilirubin: 0.4 mg/dL (ref <1.2)
Total Protein: 5.9 g/dL — ABNORMAL LOW (ref 6.5–8.1)

## 2023-05-31 MED ORDER — HYDROCODONE-ACETAMINOPHEN 5-325 MG PO TABS: 1.0000 | ORAL_TABLET | Freq: Four times a day (QID) | ORAL | 0 refills | Status: DC | PRN

## 2023-05-31 MED ORDER — FENTANYL 50 MCG/HR TD PT72: 1.0000 | MEDICATED_PATCH | TRANSDERMAL | 0 refills | Status: DC

## 2023-05-31 MED ORDER — GABAPENTIN 300 MG PO CAPS
900.0000 mg | ORAL_CAPSULE | Freq: Two times a day (BID) | ORAL | 0 refills | Status: DC
Start: 2023-05-31 — End: 2023-07-13

## 2023-05-31 NOTE — Assessment & Plan Note (Signed)
Increase fiber intake or supplement Ok to use Miralax or Colace if needed

## 2023-05-31 NOTE — Assessment & Plan Note (Addendum)
Continue gabapentin 2 times daily Refilled today

## 2023-05-31 NOTE — Progress Notes (Signed)
  Radiation Oncology         [phone]) [phone] ________________________________  Name: Nathan Berg MRN: [id number]  Date of Service: 05/31/2023  DOB: Jul 23, 1955  Post Treatment Telephone Note  Diagnosis:  67 yo gentleman with progressive metastatic prostate cancer in the spine at T3/T4; s/p decompressive laminectomy on 03/31/23 (as documented in provider EOT note)  The patient was not available for call today. Voicemail left.  The patient is scheduled for ongoing care with Dr. Cherly Hensen in medical oncology as well as his neurosurgeon Dr. Wynetta Emery. The patient was encouraged to call if he  develops concerns or questions regarding radiation.    Ruel Favors, LPN

## 2023-05-31 NOTE — Assessment & Plan Note (Signed)
Due to chemotherapy

## 2023-06-01 LAB — PSA, TOTAL AND FREE
PSA, Free Pct: 7.5 %
PSA, Free: 2.52 ng/mL
Prostate Specific Ag, Serum: 33.8 ng/mL — ABNORMAL HIGH (ref 0.0–4.0)

## 2023-06-01 LAB — TESTOSTERONE: Testosterone: <3 ng/dL — ABNORMAL LOW (ref 264–916)

## 2023-06-14 ENCOUNTER — Other Ambulatory Visit (HOSPITAL_COMMUNITY): Payer: Self-pay

## 2023-06-14 ENCOUNTER — Encounter (HOSPITAL_COMMUNITY)
Admission: RE | Admit: 2023-06-14 | Discharge: 2023-06-14 | Disposition: A | Discharge status: Home / Self Care | Payer: Medicare Other | Source: Ambulatory Visit

## 2023-06-14 DIAGNOSIS — C61 Malignant neoplasm of prostate: Secondary | ICD-10-CM

## 2023-06-14 DIAGNOSIS — C7951 Secondary malignant neoplasm of bone: Secondary | ICD-10-CM

## 2023-06-14 NOTE — Consult Note (Signed)
Chief Complaint: Patient with metastatic prostate cancer  was for  evaluation Peptide receptor radiotherapy (PRRT) with QI347 PSMA Referring Physician(s):Cheng    Patient Status: [name] - Out-pt  History of Present Illness: Nathan Berg is a 67 y.o. male [with castrate resistant metastatic prostate carcinoma.  Patient present 2015 with Gleason 4+3=7 prostate adenocarcinoma PSA equal 9.1. Subsequent robotic assisted laparoscopic radical prostatectomy bilateral lymph node dissection.  Salvage radiation therapy December 2015.  Six months and deprivation.   Progression of disease March 2022 on PSMA PET scan.   Trial taxane chemotherapy with significant toxicities.    Patient currently on Lupron and Zometa.   Evaluation for radiotherapy with Pluvicto  (Lu 177 PSMA) .   Patient has extensive metastatic bone disease on PSMA PET scan 05/05/2023.    Past Medical History:  Diagnosis Date   Anxiety    Arthritis    Cancer (HCC)    Prostate cancer   Depression    GERD (gastroesophageal reflux disease)    H/O seasonal allergies    History of kidney stones    x1   Prostate cancer [facility])     Past Surgical History:  Procedure Laterality Date   CHEST TUBE INSERTION  07/16/2019   trauma    COLONOSCOPY     HERNIA REPAIR Bilateral    20 yrs ago   LAMINECTOMY N/A 03/31/2023   Procedure: THORACIC LAMINECTOMY FOR TUMOR;  Surgeon: Donalee Citrin, MD;  Location: [facility] OR;  Service: Neurosurgery;  Laterality: N/A;   LYMPHADENECTOMY Bilateral 09/19/2013   Procedure: LYMPHADENECTOMY  PELVIC LYMPH NODE DISSECTION;  Surgeon: Valetta Fuller, MD;  Location: [facility] ORS;  Service: Urology;  Laterality: Bilateral;   PROSTATE BIOPSY     ROBOT ASSISTED LAPAROSCOPIC RADICAL PROSTATECTOMY N/A 09/19/2013   Procedure: ROBOTIC ASSISTED LAPAROSCOPIC RADICAL PROSTATECTOMY;  Surgeon: Valetta Fuller, MD;  Location: [facility] ORS;  Service: Urology;  Laterality: N/A;   TONSILLECTOMY     child   VARICOCELE EXCISION       Allergies: Codeine and Penicillin g  Medications: Prior to Admission medications   Medication Sig Start Date End Date Taking? Authorizing Provider  ALPRAZolam (XANAX) 1 MG tablet TAKE 1/2 TO 1 TABLET BY MOUTH TWICE DAILY AS NEEDED FOR ANXIETY. MUST HAVE OFFICE OR TELEMEDICINE VISIT FOR REFILLS 04/29/23   Setzer, Lynnell Jude, PA-C  amantadine (SYMMETREL) 100 MG capsule Take 1 capsule (100 mg total) by mouth daily. 04/30/23   Setzer, Lynnell Jude, PA-C  cyanocobalamin 1000 MCG tablet Take 1 tablet (1,000 mcg total) by mouth daily. 04/30/23   Setzer, Lynnell Jude, PA-C  dexamethasone (DECADRON) 4 MG tablet Take 1 tablet (4 mg total) by mouth every 6 (six) hours. 04/29/23   Setzer, Lynnell Jude, PA-C  docusate sodium (COLACE) 100 MG capsule Take 1 capsule (100 mg total) by mouth 2 (two) times daily. 10/22/22 10/22/23  Diamantina Monks, MD  escitalopram (LEXAPRO) 20 MG tablet Take 1.5 tablets (30 mg total) by mouth daily. 04/29/23   Setzer, Lynnell Jude, PA-C  fentaNYL (DURAGESIC) 50 MCG/HR Place 1 patch onto the skin every 3 (three) days. 05/31/23   Melven Sartorius, MD  gabapentin (NEURONTIN) 300 MG capsule Take 3 capsules (900 mg total) by mouth 2 (two) times daily. 05/31/23   Melven Sartorius, MD  HYDROcodone-acetaminophen (NORCO) 5-325 MG tablet Take 1 tablet by mouth every 6 (six) hours as needed for moderate pain (pain score 4-6). 05/31/23   Melven Sartorius, MD  ibuprofen (ADVIL) 200 MG  tablet Take 800 mg by mouth as needed for mild pain or moderate pain.    [provider]  lactulose (CHRONULAC) 10 GM/15ML solution Take 30 mLs (20 g total) by mouth 2 (two) times daily as needed for mild constipation, moderate constipation or severe constipation (use if inadequate response to Miralax). 04/13/23   Willeen Niece, MD  lidocaine (LIDODERM) 5 % Place 1 patch onto the skin daily. Remove & Discard patch within 12 hours or as directed by MD 04/29/23   Valetta Fuller, Lynnell Jude, PA-C  melatonin 3 MG TABS tablet Take 1  tablet (3 mg total) by mouth at bedtime as needed (insomnia). 04/29/23   Setzer, Lynnell Jude, PA-C  pantoprazole (PROTONIX) 40 MG tablet Take 1 tablet (40 mg total) by mouth 2 (two) times daily before a meal. 04/29/23 05/29/23  Setzer, Lynnell Jude, PA-C  QUEtiapine (SEROQUEL) 100 MG tablet Take 2 tablets (200 mg total) by mouth at bedtime. 04/29/23   Setzer, Lynnell Jude, PA-C     Family History  Problem Relation Age of Onset   Prostate cancer Father    Breast cancer Neg Hx    Colon cancer Neg Hx    Pancreatic cancer Neg Hx     Social History   Socioeconomic History   Marital status: Married    Spouse name: Not on file   Number of children: Not on file   Years of education: Not on file   Highest education level: Not on file  Occupational History   Not on file  Tobacco Use   Smoking status: Never   Smokeless tobacco: Never  Vaping Use   Vaping status: Never Used  Substance and Sexual Activity   Alcohol use: Yes    Comment: 4 beers per week   Drug use: No   Sexual activity: Yes  Other Topics Concern   Not on file  Social History Narrative   Not on file   Social Determinants of Health   Financial Resource Strain: Not on file  Food Insecurity: No Food Insecurity (03/29/2023)   Hunger Vital Sign    Worried About Running Out of Food in the Last Year: Never true    Ran Out of Food in the Last Year: Never true  Transportation Needs: No Transportation Needs (03/29/2023)   PRAPARE - Administrator, Civil Service (Medical): No    Lack of Transportation (Non-Medical): No  Physical Activity: Not on file  Stress: Not on file  Social Connections: Unknown (11/16/2021)   Received from [facility]   Social Network    Social Network: Not on file    ECOG Status: 1 - Symptomatic but completely ambulatory  Review of Systems: A 12 point ROS discussed and pertinent positives are indicated in the HPI above.  All other systems are negative.  Review of Systems Bone  pain and Fatigue  Vital Signs: There were no vitals taken for this visit.  Physical Exam Patient alone in wheel chair.   Imaging: No results found.  Labs:  CBC: Recent Labs    04/25/23 0541 05/04/23 1224 05/18/23 1131 05/31/23 1022  WBC 4.5 4.8 2.6* 1.4*  HGB 9.5* 10.1* 9.2* 8.8*  HCT 30.1* 31.2* 29.0* 28.8*  PLT 351 312 257 309    COAGS: Recent Labs    10/22/22 1911  INR 1.0    BMP: Recent Labs    04/25/23 0541 05/04/23 1224 05/18/23 1131 05/31/23 1022  NA 137 135 142 140  K 4.6 4.1 3.5  3.5  CL 99 104 108 106  CO2 29 25 29 30   GLUCOSE 108* 139* 81 86  BUN 14 16 17 15   CALCIUM 8.2* 8.2* 8.3* 8.3*  CREATININE 0.65 0.69 0.60* 0.77  GFRNONAA >60 >60 >60 >60    LIVER FUNCTION TESTS: Recent Labs    04/17/23 1124 05/04/23 1224 05/18/23 1131 05/31/23 1022  BILITOT 0.6 0.4 0.4 0.4  AST 17 15 13* 12*  ALT 20 24 15 10   ALKPHOS 77 78 78 94  PROT 6.0* 6.2* 5.8* 5.9*  ALBUMIN 2.8* 3.5 2.9* 2.9*    TUMOR MARKERS: No results for input(s): "AFPTM", "CEA", "CA199", "CHROMOGA" in the last 8760 hours.  Assessment and Plan:  Patient is adequate patient for a Lu  cm 177 PSMA peptide receptor radiotherapy.   Patient has PSMA positive multifocal skeletal metastasis.  Castrate resistant prostate adenocarcinoma.  Trial of taxane therapy with significant toxicities.   Patient does have significant depressed marrow with anemia and leukopenia related to chemotherapy.  Hgb = 8.8.  WBC = 1.4K.  This will be monitored closely.  Normal renal function and hepatic function.   Benefits and risks of therapy discussed with patient.  Primary benefit being progression-free survival.  Primary risk marrow toxicity.  Additional toxicities of renal toxicity and xerostomia were discussed   Patient will undergo 6 treatments of the Lu  177 PSMA therapy space 6 weeks apart over six-month interval.  Patient to return to oncologist 7 to 10 days prior to subsequent therapy for CBC, CMP  and PSA.   Patient scheduled to initiate therapy 06/23/23.   Thank you for this interesting consult.  I greatly enjoyed meeting DENBY LAUBENTHAL and look forward to participating in their care.  A copy of this report was sent to the requesting provider on this date.  Electronically Signed: Patriciaann Clan, MD 06/14/2023, 3:52 PM   I spent a total of  30 Minutes   in face to face in clinical consultation, greater than 50% of which was counseling/coordinating care for metastatic neuroendocrine tumor.

## 2023-06-15 ENCOUNTER — Other Ambulatory Visit: Payer: Self-pay | Admitting: Radiation Therapy

## 2023-06-15 ENCOUNTER — Other Ambulatory Visit (HOSPITAL_COMMUNITY): Payer: Self-pay

## 2023-06-15 DIAGNOSIS — C61 Malignant neoplasm of prostate: Secondary | ICD-10-CM

## 2023-06-15 DIAGNOSIS — C7951 Secondary malignant neoplasm of bone: Secondary | ICD-10-CM

## 2023-06-16 ENCOUNTER — Other Ambulatory Visit: Payer: Self-pay

## 2023-06-16 NOTE — Progress Notes (Signed)
Please let patient know and schedule a new baseline blood draw on 12/30 or 12/31 before his new treatment with nuclear medicine on 12/31.  I have ordered CBC, CMP, PSA, testosterone and LDH. Thanks.

## 2023-06-23 ENCOUNTER — Other Ambulatory Visit (HOSPITAL_COMMUNITY): Payer: Medicare Other

## 2023-07-04 NOTE — Assessment & Plan Note (Deleted)
Due to chemotherapy

## 2023-07-04 NOTE — Assessment & Plan Note (Deleted)
 calcium (1000-1200 mg daily from food and supplements) and vitamin D3 (1000 IU daily) Zometa 4 mg every 3 months Routine dental care Continue working with physical therapy

## 2023-07-04 NOTE — Assessment & Plan Note (Deleted)
gabapentin 900 mg 2 times daily which he has been taking and tolerating well. Fentanyl patch 25 mcg. Refilled today

## 2023-07-04 NOTE — Assessment & Plan Note (Deleted)
Cannot tolerate docetaxel due to toxicities Continue with Pluvicto Monitor PSA monthly Monitor CBC Last lupron was on 10/16 at 30 mg. Will resume in Feb Last zometa on 10/16. Will resume in January

## 2023-07-04 NOTE — Progress Notes (Deleted)
 Patient Care Team: Nathan Callander, MD as PCP - General (Hematology) Nathan Callander, MD as Attending Physician (Hematology and Oncology) Tanda Prentice DEL, MD (Family Medicine)  Clinic Day:  07/04/2023  Referring physician: Lanny Callander, MD  ASSESSMENT & PLAN:   Assessment & Plan: 67 y.o. man with history of GERD, prostate cancer being seen at Medical oncology for prostate cancer.   Current diagnosis: mCRPC Germline: performed. Pending Somatic: ATM. TP53. TMB 8.70m/MB Initial diagnosis: 2015. Gleason score 4+3 = 7 PSA of 9.1 Previous treatment:  s/p robotic assisted laparoscopic radical prostatectomy and bilateral lymph node dissection in March 2015  ADT, enzalutamide , olaparib  2015 salvage radiation with 6 months of ADT Radiation Right iliac crest and the right internal iliac lymph node, T spine Docetaxel  every 2 weeks. Received one dose with significant toxicities from docetaxel  despite a low dose.  He has worsening fatigue, neutropenia, weakness, neuropathy, constipation.      Prostate cancer (HCC) Cannot tolerate docetaxel  due to toxicities Continue with Pluvicto  Monitor PSA monthly Monitor CBC Last lupron  was on 10/16 at 30 mg. Will resume in Feb Last zometa  on 10/16. Will resume in January  At risk for side effect of medication calcium  (1000-1200 mg daily from food and supplements) and vitamin D3 (1000 IU daily) Zometa  4 mg every 3 months Routine dental care Continue working with physical therapy  Drug-induced neutropenia (HCC) Due to chemotherapy   Return in 3 weeks with labs. The patient understands the plans discussed today and is in agreement with them.  He knows to contact our office if he develops concerns prior to his next appointment.  Nathan JAYSON Chihuahua, MD  [facility] - A DEPT OF [facility] [address] [place] [address] Dept: [phone] Dept Fax: [phone]   No orders of the defined types  were placed in this encounter.     CHIEF COMPLAINT:  CC: ***  Current Treatment:  ***  INTERVAL HISTORY:  Nathan Berg is here today for repeat clinical assessment. He denies fevers or chills. He denies pain. His appetite is good. His weight {Weight change:10426}.  I have reviewed the past medical history, past surgical history, social history and family history with the patient and they are unchanged from previous note.  ALLERGIES:  is allergic to codeine and penicillin g.  MEDICATIONS:  Current Outpatient Medications  Medication Sig Dispense Refill   ALPRAZolam  (XANAX ) 1 MG tablet TAKE 1/2 TO 1 TABLET BY MOUTH TWICE DAILY AS NEEDED FOR ANXIETY. MUST HAVE OFFICE OR TELEMEDICINE VISIT FOR REFILLS     amantadine  (SYMMETREL ) 100 MG capsule Take 1 capsule (100 mg total) by mouth daily. 30 capsule 0   cyanocobalamin  1000 MCG tablet Take 1 tablet (1,000 mcg total) by mouth daily. 30 tablet 0   dexamethasone  (DECADRON ) 4 MG tablet Take 1 tablet (4 mg total) by mouth every 6 (six) hours. 30 tablet 0   docusate sodium  (COLACE) 100 MG capsule Take 1 capsule (100 mg total) by mouth 2 (two) times daily. 60 capsule 2   escitalopram  (LEXAPRO ) 20 MG tablet Take 1.5 tablets (30 mg total) by mouth daily. 45 tablet 0   fentaNYL  (DURAGESIC ) 50 MCG/HR Place 1 patch onto the skin every 3 (three) days. 10 patch 0   gabapentin  (NEURONTIN ) 300 MG capsule Take 3 capsules (900 mg total) by mouth 2 (two) times daily. 270 capsule 0   HYDROcodone -acetaminophen  (NORCO) 5-325 MG tablet Take 1 tablet by mouth  every 6 (six) hours as needed for moderate pain (pain score 4-6). 60 tablet 0   ibuprofen  (ADVIL ) 200 MG tablet Take 800 mg by mouth as needed for mild pain or moderate pain.     lactulose  (CHRONULAC ) 10 GM/15ML solution Take 30 mLs (20 g total) by mouth 2 (two) times daily as needed for mild constipation, moderate constipation or severe constipation (use if inadequate response to Miralax ). 236 mL 0   lidocaine   (LIDODERM ) 5 % Place 1 patch onto the skin daily. Remove & Discard patch within 12 hours or as directed by MD 30 patch 0   melatonin 3 MG TABS tablet Take 1 tablet (3 mg total) by mouth at bedtime as needed (insomnia).     pantoprazole  (PROTONIX ) 40 MG tablet Take 1 tablet (40 mg total) by mouth 2 (two) times daily before a meal. 60 tablet 1   QUEtiapine  (SEROQUEL ) 100 MG tablet Take 2 tablets (200 mg total) by mouth at bedtime. 60 tablet 0   No current facility-administered medications for this visit.    HISTORY OF PRESENT ILLNESS:   Oncology History Overview Note   Cancer Staging  Malignant neoplasm of prostate (HCC) Staging form: Prostate, AJCC 7th Edition - Clinical: Stage IV (TX, NX, M1, PSA: Less than 10, Gleason 8-10) - Signed by Amadeo Windell SAILOR, MD on 04/30/2021     Prostate cancer (HCC)  09/19/2013 Initial Diagnosis   Malignant neoplasm of prostate (HCC)   04/30/2021 Cancer Staging   Staging form: Prostate, AJCC 7th Edition - Clinical: Stage IV (TX, NX, M1, PSA: Less than 10, Gleason 8-10) - Signed by Amadeo Windell SAILOR, MD on 04/30/2021   03/11/2022 Imaging    IMPRESSION: 1. Multifocal intense radiotracer avid skeletal metastasis again noted involving the axillary skeleton. No change in pattern or number of lesions. No new lesions identified. 2. The radiotracer activity of the lesions; however, is uniformly increased from comparison exam. 3. The sclerotic pattern of the lesions on comparison CT is a similar. One lesion at T3 as new lytic component. 4. No visceral metastasis.  No new nodal disease.     12/09/2022 PET scan    IMPRESSION: 1. 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 2. Metastatic skeletal lesion in the RIGHT iliac wing is increased in size compared to prior. 3. Other than the 2 above lesions, the pattern and intensity of multifocal radiotracer avid skeletal metastasis is unchanged. No  new lesions identified. 4. No evidence of nodal metastasis or visceral metastasis.     03/09/2023 Tumor Marker   PSA 25   04/19/2023 - 05/02/2023 Radiation Therapy   SBRT T spine 40 Gy/5 fx   05/04/2023 PET scan   PSMA PET 1. Interval increase in size and degree of tracer uptake associated with multifocal bone metastases. 2. New tracer avid nodule within the medial right upper lobe is compatible with pulmonary metastasis. 3. Additional small scattered lung nodules are identified which are too small to characterize by PET-CT but appear new from the previous exam. 4.  Aortic Atherosclerosis (ICD10-I70.0).   05/04/2023 Tumor Marker   PSA 44.3   05/18/2023 - 05/18/2023 Chemotherapy   Patient is on Treatment Plan : PROSTATE Docetaxel  (50) + Prednisone  q14d      Genetic Testing   [facility] CancerNext-Expanded Panel+RNA was Negative. Report date is 05/26/2023.   The CancerNext-Expanded gene panel offered by [facility] and includes sequencing, rearrangement, and RNA analysis for the following 71 genes:  AIP, ALK, APC, ATM, AXIN2, BAP1, BARD1, BMPR1A, BRCA1, BRCA2, BRIP1, CDC73, CDH1, CDK4, CDKN1B, CDKN2A, CHEK2, CTNNA1, DICER1, FH, FLCN, KIF1B, LZTR1, MAX, MEN1, MET, MLH1, MSH2, MSH3, MSH6, MUTYH, NF1, NF2, NTHL1, PALB2, PHOX2B, PMS2, POT1, PRKAR1A, PTCH1, PTEN, RAD51C, RAD51D, RB1, RET, SDHA, SDHAF2, SDHB, SDHC, SDHD, SMAD4, SMARCA4, SMARCB1, SMARCE1, STK11, SUFU, TMEM127, TP53, TSC1, TSC2, and VHL (sequencing and deletion/duplication); EGFR, EGLN1, HOXB13, KIT, MITF, PDGFRA, POLD1, and POLE (sequencing only); EPCAM and GREM1 (deletion/duplication only).         REVIEW OF SYSTEMS:   All relevant systems were reviewed with the patient and are negative.   VITALS:  There were no vitals taken for this visit.  Wt Readings from Last 3 Encounters:  05/31/23 186 lb 3.2 oz (84.5 kg)  05/16/23 181 lb 11.2 oz (82.4 kg)  04/13/23 191 lb 9.3 oz (86.9 kg)    There is no height or weight on  file to calculate BMI.  Performance status (ECOG): {CHL ONC [id number]  PHYSICAL EXAM:   GENERAL:alert, no distress and comfortable SKIN: skin color normal, no rashes  EYES: normal, sclera clear OROPHARYNX: no exudate, no erythema    NECK: supple,  non-tender, without nodularity LYMPH:  no palpable cervical lymphadenopathy LUNGS: clear to auscultation with normal breathing effort.  No wheeze or rales HEART: regular rate & rhythm and no murmurs and no lower extremity edema ABDOMEN: abdomen soft, non-tender and nondistended Musculoskeletal: no edema NEURO: alert, fluent speech, no focal motor/sensory deficits.  Strength and sensation equal bilaterally.  LABORATORY DATA:  I have reviewed the data as listed    Component Value Date/Time   NA 140 05/31/2023 1022   K 3.5 05/31/2023 1022   CL 106 05/31/2023 1022   CO2 30 05/31/2023 1022   GLUCOSE 86 05/31/2023 1022   BUN 15 05/31/2023 1022   CREATININE 0.77 05/31/2023 1022   CALCIUM  8.3 (L) 05/31/2023 1022   PROT 5.9 (L) 05/31/2023 1022   ALBUMIN  2.9 (L) 05/31/2023 1022   AST 12 (L) 05/31/2023 1022   ALT 10 05/31/2023 1022   ALKPHOS 94 05/31/2023 1022   BILITOT 0.4 05/31/2023 1022   GFRNONAA >60 05/31/2023 1022   GFRAA >60 07/17/2019 0322    No results found for: SPEP, UPEP  Lab Results  Component Value Date   WBC 1.4 (L) 05/31/2023   NEUTROABS 0.5 (L) 05/31/2023   HGB 8.8 (L) 05/31/2023   HCT 28.8 (L) 05/31/2023   MCV 90.3 05/31/2023   PLT 309 05/31/2023      Chemistry      Component Value Date/Time   NA 140 05/31/2023 1022   K 3.5 05/31/2023 1022   CL 106 05/31/2023 1022   CO2 30 05/31/2023 1022   BUN 15 05/31/2023 1022   CREATININE 0.77 05/31/2023 1022      Component Value Date/Time   CALCIUM  8.3 (L) 05/31/2023 1022   ALKPHOS 94 05/31/2023 1022   AST 12 (L) 05/31/2023 1022   ALT 10 05/31/2023 1022   BILITOT 0.4 05/31/2023 1022       RADIOGRAPHIC STUDIES: I have personally reviewed the  radiological images as listed and agreed with the findings in the report. 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 Patient present 2015 with Gleason 4+3=7 prostate adenocarcinoma PSA equal 9.1. Subsequent robotic assisted laparoscopic radical prostatectomy bilateral lymph node dissection. Salvage radiation therapy December 2015. Six months and deprivation. Progression of disease 09-17-2023  2022 on PSMA PET scan. Trial taxane chemotherapy with significant toxicities. Patient currently on Lupron  and Zometa . Evaluation for radiotherapy with Pluvicto   (Lu 177 PSMA) . Patient has extensive metastatic bone disease on PSMA PET scan 05/05/2023. HISTORY OF PRESENT ILLNESS: See epic note REVIEW OF SYSTEMS: See epic note PHYSICAL EXAMINATION: See epic note ASSESSMENT AND PLAN: 1. Patient is adequate patient for a Lu cm 177 PSMA peptide receptor radiotherapy. 2. 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 Trial of taxane therapy with significant toxicities. 3. Patient does have significant depressed marrow with anemia and leukopenia related to chemotherapy. Hgb = 8.8. WBC = 1.4K. This will be monitored closely. Normal renal function and hepatic function. 4. Benefits and risks of therapy discussed with patient. Primary benefit being progression-free survival. Primary risk marrow toxicity. Additional toxicities of renal toxicity and xerostomia were discussed 5. Patient will undergo 6 treatments of the Lu 177 PSMA therapy space 6 weeks apart over six-month interval. Patient to return to oncologist 7 to 10 days prior to subsequent therapy for CBC, CMP and PSA. Electronically Signed   By: Jackquline Boxer M.D.   On: 06/14/2023 15:58

## 2023-07-05 ENCOUNTER — Inpatient Hospital Stay: Payer: Medicare Other

## 2023-07-05 ENCOUNTER — Ambulatory Visit (HOSPITAL_COMMUNITY)
Admission: RE | Admit: 2023-07-05 | Discharge: 2023-07-05 | Disposition: A | Discharge status: Home / Self Care | Payer: Medicare Other | Source: Ambulatory Visit

## 2023-07-05 VITALS — BP 130/73 | HR 73

## 2023-07-05 DIAGNOSIS — C61 Malignant neoplasm of prostate: Secondary | ICD-10-CM | POA: Insufficient documentation

## 2023-07-05 DIAGNOSIS — C7951 Secondary malignant neoplasm of bone: Secondary | ICD-10-CM | POA: Diagnosis present

## 2023-07-05 LAB — CBC WITH DIFFERENTIAL/PLATELET
Abs Immature Granulocytes: 0.01 10*3/uL (ref 0.00–0.07)
Basophils Absolute: 0 10*3/uL (ref 0.0–0.1)
Basophils Relative: 1 %
Eosinophils Absolute: 0.1 10*3/uL (ref 0.0–0.5)
Eosinophils Relative: 2 %
HCT: 27.4 % — ABNORMAL LOW (ref 39.0–52.0)
Hemoglobin: 8.3 g/dL — ABNORMAL LOW (ref 13.0–17.0)
Immature Granulocytes: 0 %
Lymphocytes Relative: 23 %
Lymphs Abs: 0.7 10*3/uL (ref 0.7–4.0)
MCH: 25.1 pg — ABNORMAL LOW (ref 26.0–34.0)
MCHC: 30.3 g/dL (ref 30.0–36.0)
MCV: 82.8 fL (ref 80.0–100.0)
Monocytes Absolute: 0.5 10*3/uL (ref 0.1–1.0)
Monocytes Relative: 15 %
Neutro Abs: 1.9 10*3/uL (ref 1.7–7.7)
Neutrophils Relative %: 59 %
Platelets: 347 10*3/uL (ref 150–400)
RBC: 3.31 MIL/uL — ABNORMAL LOW (ref 4.22–5.81)
RDW: 15.7 % — ABNORMAL HIGH (ref 11.5–15.5)
WBC: 3.2 10*3/uL — ABNORMAL LOW (ref 4.0–10.5)
nRBC: 0 % (ref 0.0–0.2)

## 2023-07-05 LAB — BASIC METABOLIC PANEL
Anion gap: 8 (ref 5–15)
BUN: 14 mg/dL (ref 8–23)
CO2: 25 mmol/L (ref 22–32)
Calcium: 8.2 mg/dL — ABNORMAL LOW (ref 8.9–10.3)
Chloride: 103 mmol/L (ref 98–111)
Creatinine, Ser: 0.74 mg/dL (ref 0.61–1.24)
GFR, Estimated: >60 mL/min (ref >60)
Glucose, Bld: 93 mg/dL (ref 70–99)
Potassium: 3.5 mmol/L (ref 3.5–5.1)
Sodium: 136 mmol/L (ref 135–145)

## 2023-07-05 LAB — PSA: Prostatic Specific Antigen: 34.92 ng/mL — ABNORMAL HIGH (ref 0.00–4.00)

## 2023-07-05 MED ORDER — LUTETIUM LU 177 VIPIVOTIDE TET 1000 MBQ/ML IV SOLN: 203.0000 | Freq: Once | INTRAVENOUS | Status: DC

## 2023-07-05 MED ORDER — SODIUM CHLORIDE 0.9 % IV BOLUS: 1000.0000 mL | Freq: Once | INTRAVENOUS | Status: DC

## 2023-07-05 MED ORDER — LUTETIUM LU 177 VIPIVOTIDE TET 1000 MBQ/ML IV SOLN
203.0000 | Freq: Once | INTRAVENOUS | Status: AC
  Administered 2023-07-05: 203 via INTRAVENOUS

## 2023-07-05 NOTE — Progress Notes (Signed)
Tolerated treatment well.

## 2023-07-05 NOTE — Progress Notes (Signed)
 CLINICAL DATA: [Castrate resistant prostate adenocarcinoma.  Trial of taxing chemotherapy with adverse effects.  PSMA avid skeletal metastasis identified on is PSMA  PET scan 05/05/2023.]    EXAM: NUCLEAR MEDICINE PLUVICTO  INJECTION  TECHNIQUE: Infusion: The nuclear medicine technologist and I personally verified the dose activity to be delivered as specified in the written directive, and verified the patient identification via 2 separate methods.  Initial flush of the intravenous catheter was performed was sterile saline. The dose syringe was connected to the catheter and the Lu-177 Pluvicto  administered over a 1 to 10 min infusion. Single 10 cc  lushes with normal saline follow the dose. No complications were noted. The entire IV tubing, venocatheter, stopcock and syringes was removed in total, placed in a disposal bag and sent for assay of the residual activity, which will be reported at a later time in our EMR by the physics staff. Pressure was applied to the venipuncture site, and a compression bandage placed. Patient monitored for 1 hour following infusion.    Radiation Safety personnel were present to perform the discharge survey, as detailed on their documentation. After a short period of observation, the patient had his IV removed.  RADIOPHARMACEUTICALS: [Two hundred three] microcuries Lu-177 PLUVICTO   FINDINGS: Current Infusion: [1]  Planned Infusions: 6    Patient presented to nuclear medicine for treatment. The patient's most recent blood counts were reviewed and remains a good candidate to proceed with Lu-177 Pluvicto .    Patient chronically anemic with hemoglobin equal 8.3.  White blood cell count improved to 3.21.4.  Normal renal function liver function.     PSA equal 34.9     The patient was situated in an infusion suite with a contact barrier placed under the arm. Intravenous access was established, using sterile technique, and a normal saline infusion from a  syringe was started.     Micro-dosimetry: The prescribed radiation activity was assayed and confirmed to be within specified tolerance.  IMPRESSION: Current Infusion: [1]  Planned Infusions: 6    [The patient tolerated the infusion well. The patient will return in 6 weeks for ongoing care.]

## 2023-07-05 NOTE — Written Directive (Addendum)
  PLUVICTO   THERAPY   RADIOPHARMACEUTICAL: Lutetium 177 vipivotide tetraxetan (Pluvicto )     PRESCRIBED DOSE FOR ADMINISTRATION:  200 mCi   ROUTE OFADMINISTRATION:  IV   DIAGNOSIS:  Prostate Cancer metastatic to bone   REFERRING PHYSICIAN: Pauletta Chihuahua   TREATMENT #: 1   ADDITIONAL PHYSICIAN COMMENTS/NOTES: Norleen GORMAN Boxer, MD   07/05/23    10:40 AM     AUTHORIZED USER SIGNATURE & TIME STAMP:

## 2023-07-07 ENCOUNTER — Telehealth: Payer: Self-pay | Admitting: Radiation Therapy

## 2023-07-07 NOTE — Telephone Encounter (Signed)
 I spoke with Mr. Nathan Berg about his upcoming thoracic spine MRI on 1/28 and telephone follow-up with Ashlyn on 2/5.  He wrote these down and plans to attend.   Jalene Mullet R.T.(R)(T) Radiation Special Procedures Navigator

## 2023-07-10 ENCOUNTER — Emergency Department (HOSPITAL_COMMUNITY): Payer: Medicare Other

## 2023-07-10 ENCOUNTER — Other Ambulatory Visit: Payer: Self-pay

## 2023-07-10 ENCOUNTER — Encounter (HOSPITAL_COMMUNITY): Payer: Self-pay | Admitting: Emergency Medicine

## 2023-07-10 ENCOUNTER — Inpatient Hospital Stay (HOSPITAL_COMMUNITY)
Admission: EM | Admit: 2023-07-10 | Discharge: 2023-07-13 | DRG: 543 | Disposition: A | Discharge status: Home / Self Care | Payer: Medicare Other | Attending: Internal Medicine | Admitting: Internal Medicine

## 2023-07-10 DIAGNOSIS — C61 Malignant neoplasm of prostate: Secondary | ICD-10-CM | POA: Diagnosis present

## 2023-07-10 DIAGNOSIS — E538 Deficiency of other specified B group vitamins: Secondary | ICD-10-CM | POA: Diagnosis present

## 2023-07-10 DIAGNOSIS — F419 Anxiety disorder, unspecified: Secondary | ICD-10-CM | POA: Diagnosis present

## 2023-07-10 DIAGNOSIS — Z66 Do not resuscitate: Secondary | ICD-10-CM | POA: Diagnosis present

## 2023-07-10 DIAGNOSIS — M4802 Spinal stenosis, cervical region: Secondary | ICD-10-CM | POA: Diagnosis present

## 2023-07-10 DIAGNOSIS — Z515 Encounter for palliative care: Secondary | ICD-10-CM | POA: Diagnosis not present

## 2023-07-10 DIAGNOSIS — Z880 Allergy status to penicillin: Secondary | ICD-10-CM

## 2023-07-10 DIAGNOSIS — Z885 Allergy status to narcotic agent status: Secondary | ICD-10-CM

## 2023-07-10 DIAGNOSIS — F411 Generalized anxiety disorder: Secondary | ICD-10-CM | POA: Diagnosis present

## 2023-07-10 DIAGNOSIS — G620 Drug-induced polyneuropathy: Secondary | ICD-10-CM | POA: Diagnosis present

## 2023-07-10 DIAGNOSIS — I10 Essential (primary) hypertension: Secondary | ICD-10-CM | POA: Diagnosis present

## 2023-07-10 DIAGNOSIS — G629 Polyneuropathy, unspecified: Secondary | ICD-10-CM | POA: Diagnosis not present

## 2023-07-10 DIAGNOSIS — Z8042 Family history of malignant neoplasm of prostate: Secondary | ICD-10-CM | POA: Diagnosis not present

## 2023-07-10 DIAGNOSIS — Z8546 Personal history of malignant neoplasm of prostate: Secondary | ICD-10-CM

## 2023-07-10 DIAGNOSIS — G9529 Other cord compression: Secondary | ICD-10-CM | POA: Diagnosis present

## 2023-07-10 DIAGNOSIS — Z7189 Other specified counseling: Secondary | ICD-10-CM | POA: Diagnosis not present

## 2023-07-10 DIAGNOSIS — C7951 Secondary malignant neoplasm of bone: Principal | ICD-10-CM | POA: Diagnosis present

## 2023-07-10 DIAGNOSIS — G893 Neoplasm related pain (acute) (chronic): Secondary | ICD-10-CM | POA: Diagnosis present

## 2023-07-10 DIAGNOSIS — D649 Anemia, unspecified: Secondary | ICD-10-CM | POA: Diagnosis present

## 2023-07-10 DIAGNOSIS — F32A Depression, unspecified: Secondary | ICD-10-CM | POA: Diagnosis present

## 2023-07-10 DIAGNOSIS — R296 Repeated falls: Secondary | ICD-10-CM | POA: Diagnosis present

## 2023-07-10 DIAGNOSIS — D508 Other iron deficiency anemias: Secondary | ICD-10-CM | POA: Diagnosis not present

## 2023-07-10 DIAGNOSIS — D630 Anemia in neoplastic disease: Secondary | ICD-10-CM | POA: Diagnosis present

## 2023-07-10 DIAGNOSIS — T451X5A Adverse effect of antineoplastic and immunosuppressive drugs, initial encounter: Secondary | ICD-10-CM | POA: Diagnosis present

## 2023-07-10 DIAGNOSIS — E860 Dehydration: Secondary | ICD-10-CM | POA: Diagnosis present

## 2023-07-10 DIAGNOSIS — M4804 Spinal stenosis, thoracic region: Secondary | ICD-10-CM | POA: Diagnosis present

## 2023-07-10 DIAGNOSIS — D509 Iron deficiency anemia, unspecified: Secondary | ICD-10-CM | POA: Diagnosis not present

## 2023-07-10 DIAGNOSIS — K219 Gastro-esophageal reflux disease without esophagitis: Secondary | ICD-10-CM | POA: Diagnosis present

## 2023-07-10 DIAGNOSIS — G9520 Unspecified cord compression: Secondary | ICD-10-CM | POA: Diagnosis not present

## 2023-07-10 DIAGNOSIS — Z79899 Other long term (current) drug therapy: Secondary | ICD-10-CM | POA: Diagnosis not present

## 2023-07-10 DIAGNOSIS — E611 Iron deficiency: Secondary | ICD-10-CM | POA: Diagnosis present

## 2023-07-10 LAB — VITAMIN B12: Vitamin B-12: 887 pg/mL (ref 180–914)

## 2023-07-10 LAB — FOLATE: Folate: 5.5 ng/mL — ABNORMAL LOW (ref >5.9)

## 2023-07-10 LAB — COMPREHENSIVE METABOLIC PANEL
ALT: 33 U/L (ref 0–44)
AST: 21 U/L (ref 15–41)
Albumin: 2.7 g/dL — ABNORMAL LOW (ref 3.5–5.0)
Alkaline Phosphatase: 76 U/L (ref 38–126)
Anion gap: 9 (ref 5–15)
BUN: 12 mg/dL (ref 8–23)
CO2: 25 mmol/L (ref 22–32)
Calcium: 8.1 mg/dL — ABNORMAL LOW (ref 8.9–10.3)
Chloride: 100 mmol/L (ref 98–111)
Creatinine, Ser: 0.6 mg/dL — ABNORMAL LOW (ref 0.61–1.24)
GFR, Estimated: >60 mL/min (ref >60)
Glucose, Bld: 102 mg/dL — ABNORMAL HIGH (ref 70–99)
Potassium: 4.2 mmol/L (ref 3.5–5.1)
Sodium: 134 mmol/L — ABNORMAL LOW (ref 135–145)
Total Bilirubin: 0.3 mg/dL (ref 0.0–1.2)
Total Protein: 5.7 g/dL — ABNORMAL LOW (ref 6.5–8.1)

## 2023-07-10 LAB — CBC WITH DIFFERENTIAL/PLATELET
Abs Immature Granulocytes: 0.01 K/uL (ref 0.00–0.07)
Basophils Absolute: 0 K/uL (ref 0.0–0.1)
Basophils Relative: 1 %
Eosinophils Absolute: 0.1 K/uL (ref 0.0–0.5)
Eosinophils Relative: 2 %
HCT: 28.2 % — ABNORMAL LOW (ref 39.0–52.0)
Hemoglobin: 8.5 g/dL — ABNORMAL LOW (ref 13.0–17.0)
Immature Granulocytes: 0 %
Lymphocytes Relative: 18 %
Lymphs Abs: 0.7 K/uL (ref 0.7–4.0)
MCH: 24.4 pg — ABNORMAL LOW (ref 26.0–34.0)
MCHC: 30.1 g/dL (ref 30.0–36.0)
MCV: 81 fL (ref 80.0–100.0)
Monocytes Absolute: 0.5 K/uL (ref 0.1–1.0)
Monocytes Relative: 12 %
Neutro Abs: 2.8 K/uL (ref 1.7–7.7)
Neutrophils Relative %: 67 %
Platelets: 291 K/uL (ref 150–400)
RBC: 3.48 MIL/uL — ABNORMAL LOW (ref 4.22–5.81)
RDW: 16.1 % — ABNORMAL HIGH (ref 11.5–15.5)
WBC: 4.1 K/uL (ref 4.0–10.5)
nRBC: 0 % (ref 0.0–0.2)

## 2023-07-10 LAB — IRON AND TIBC
Iron: 33 ug/dL — ABNORMAL LOW (ref 45–182)
Saturation Ratios: 10 % — ABNORMAL LOW (ref 17.9–39.5)
TIBC: 335 ug/dL (ref 250–450)
UIBC: 302 ug/dL

## 2023-07-10 LAB — FERRITIN: Ferritin: 25 ng/mL (ref 24–336)

## 2023-07-10 MED ORDER — HYDROMORPHONE HCL 1 MG/ML IJ SOLN
1.0000 mg | Freq: Once | INTRAMUSCULAR | Status: AC
  Administered 2023-07-10: 1 mg via INTRAVENOUS
  Filled 2023-07-10: qty 1

## 2023-07-10 MED ORDER — GABAPENTIN 300 MG PO CAPS
900.0000 mg | ORAL_CAPSULE | Freq: Two times a day (BID) | ORAL | Status: DC
  Administered 2023-07-10 – 2023-07-13 (×6): 900 mg via ORAL
  Filled 2023-07-10 (×6): qty 3

## 2023-07-10 MED ORDER — DOCUSATE SODIUM 100 MG PO CAPS
100.0000 mg | ORAL_CAPSULE | Freq: Every day | ORAL | Status: DC
  Administered 2023-07-11: 100 mg via ORAL
  Filled 2023-07-10 (×3): qty 1

## 2023-07-10 MED ORDER — MAGNESIUM OXIDE -MG SUPPLEMENT 400 (240 MG) MG PO TABS
400.0000 mg | ORAL_TABLET | Freq: Every day | ORAL | Status: DC
  Administered 2023-07-10 – 2023-07-13 (×4): 400 mg via ORAL
  Filled 2023-07-10 (×4): qty 1

## 2023-07-10 MED ORDER — HYDROMORPHONE HCL 1 MG/ML IJ SOLN
1.0000 mg | Freq: Once | INTRAMUSCULAR | Status: AC
Start: 2023-07-10 — End: 2023-07-10
  Administered 2023-07-10: 1 mg via INTRAVENOUS
  Filled 2023-07-10: qty 1

## 2023-07-10 MED ORDER — PANTOPRAZOLE SODIUM 40 MG PO TBEC: 40.0000 mg | DELAYED_RELEASE_TABLET | Freq: Two times a day (BID) | ORAL | Status: DC

## 2023-07-10 MED ORDER — PANTOPRAZOLE SODIUM 40 MG PO TBEC
40.0000 mg | DELAYED_RELEASE_TABLET | Freq: Two times a day (BID) | ORAL | Status: DC
  Administered 2023-07-10 – 2023-07-13 (×7): 40 mg via ORAL
  Filled 2023-07-10 (×7): qty 1

## 2023-07-10 MED ORDER — VITAMIN B-12 1000 MCG PO TABS: 1000.0000 ug | ORAL_TABLET | Freq: Every day | ORAL | Status: DC

## 2023-07-10 MED ORDER — HYDROMORPHONE HCL 1 MG/ML IJ SOLN
0.5000 mg | Freq: Once | INTRAMUSCULAR | Status: AC
  Administered 2023-07-10: 0.5 mg via INTRAVENOUS
  Filled 2023-07-10: qty 1

## 2023-07-10 MED ORDER — FENTANYL 50 MCG/HR TD PT72
1.0000 | MEDICATED_PATCH | TRANSDERMAL | Status: DC
Start: 2023-07-11 — End: 2023-07-13
  Administered 2023-07-11: 1 via TRANSDERMAL
  Filled 2023-07-10: qty 1

## 2023-07-10 MED ORDER — ONDANSETRON HCL 4 MG/2ML IJ SOLN: 4.0000 mg | Freq: Four times a day (QID) | INTRAMUSCULAR | Status: DC | PRN

## 2023-07-10 MED ORDER — QUETIAPINE FUMARATE 200 MG PO TABS
200.0000 mg | ORAL_TABLET | Freq: Every day | ORAL | Status: DC
  Administered 2023-07-10 – 2023-07-12 (×3): 200 mg via ORAL
  Filled 2023-07-10: qty 1
  Filled 2023-07-10: qty 2
  Filled 2023-07-10: qty 1

## 2023-07-10 MED ORDER — ESCITALOPRAM OXALATE 20 MG PO TABS
30.0000 mg | ORAL_TABLET | Freq: Every day | ORAL | Status: DC
  Administered 2023-07-10 – 2023-07-13 (×4): 30 mg via ORAL
  Filled 2023-07-10 (×2): qty 1
  Filled 2023-07-10: qty 3
  Filled 2023-07-10: qty 1

## 2023-07-10 MED ORDER — MELATONIN 3 MG PO TABS
3.0000 mg | ORAL_TABLET | Freq: Every evening | ORAL | Status: DC | PRN
Start: 2023-07-10 — End: 2023-07-13

## 2023-07-10 MED ORDER — GADOBUTROL 1 MMOL/ML IV SOLN
8.0000 mL | Freq: Once | INTRAVENOUS | Status: AC | PRN
  Administered 2023-07-10: 8 mL via INTRAVENOUS

## 2023-07-10 MED ORDER — METHOCARBAMOL 500 MG PO TABS
750.0000 mg | ORAL_TABLET | Freq: Three times a day (TID) | ORAL | Status: DC | PRN
  Administered 2023-07-10: 750 mg via ORAL
  Filled 2023-07-10: qty 2

## 2023-07-10 MED ORDER — VITAMIN B-12 1000 MCG PO TABS
1000.0000 ug | ORAL_TABLET | Freq: Every day | ORAL | Status: DC
  Administered 2023-07-11 – 2023-07-13 (×3): 1000 ug via ORAL
  Filled 2023-07-10 (×3): qty 1

## 2023-07-10 MED ORDER — ACETAMINOPHEN 325 MG PO TABS
650.0000 mg | ORAL_TABLET | Freq: Four times a day (QID) | ORAL | Status: DC | PRN
  Administered 2023-07-10: 650 mg via ORAL
  Filled 2023-07-10: qty 2

## 2023-07-10 MED ORDER — TRAZODONE HCL 100 MG PO TABS
100.0000 mg | ORAL_TABLET | Freq: Every day | ORAL | Status: DC
  Administered 2023-07-10 – 2023-07-12 (×3): 100 mg via ORAL
  Filled 2023-07-10 (×3): qty 1

## 2023-07-10 MED ORDER — ONDANSETRON HCL 4 MG PO TABS: 4.0000 mg | ORAL_TABLET | Freq: Four times a day (QID) | ORAL | Status: DC | PRN

## 2023-07-10 MED ORDER — DEXAMETHASONE 4 MG PO TABS
4.0000 mg | ORAL_TABLET | Freq: Four times a day (QID) | ORAL | Status: DC
  Administered 2023-07-10 – 2023-07-13 (×12): 4 mg via ORAL
  Filled 2023-07-10 (×12): qty 1

## 2023-07-10 MED ORDER — ACETAMINOPHEN 650 MG RE SUPP: 650.0000 mg | Freq: Four times a day (QID) | RECTAL | Status: DC | PRN

## 2023-07-10 MED ORDER — ALPRAZOLAM 0.5 MG PO TABS
0.5000 mg | ORAL_TABLET | Freq: Two times a day (BID) | ORAL | Status: DC | PRN
  Administered 2023-07-11: 0.5 mg via ORAL
  Filled 2023-07-10 (×2): qty 1

## 2023-07-10 MED ORDER — HYDROMORPHONE HCL 1 MG/ML IJ SOLN
0.5000 mg | INTRAMUSCULAR | Status: DC | PRN
Start: 2023-07-10 — End: 2023-07-11
  Administered 2023-07-10 – 2023-07-11 (×6): 1 mg via INTRAVENOUS
  Filled 2023-07-10 (×6): qty 1

## 2023-07-10 NOTE — Assessment & Plan Note (Deleted)
 Repeat B12, continue oral replacement

## 2023-07-10 NOTE — ED Provider Notes (Signed)
 [place] EMERGENCY DEPARTMENT AT [facility] Provider Note   CSN: [id number] Arrival date & time: 07/10/23  1044     History  Chief Complaint  Patient presents with   Arm Pain   Weakness    Nathan Berg is a 68 y.o. male.  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 Here with recurrent intense pain of the upper back extending to left lateral chest and left arm weakness, unable to grip object. No fever, SOB, chest pain, abdominal pain, nausea.   The history is provided by the patient and a relative. No language interpreter was used.  Arm Pain  Weakness      Home Medications Prior to Admission medications   Medication Sig Start Date End Date Taking? Authorizing Provider  ALPRAZolam  (XANAX ) 1 MG tablet TAKE 1/2 TO 1 TABLET BY MOUTH TWICE DAILY AS NEEDED FOR ANXIETY. MUST HAVE OFFICE OR TELEMEDICINE VISIT FOR REFILLS 04/29/23  Yes Setzer, Nena PARAS, PA-C  cyanocobalamin  1000 MCG tablet Take 1 tablet (1,000 mcg total) by mouth daily. 04/30/23  Yes Setzer, Sandra J, PA-C  dexamethasone  (DECADRON ) 4 MG tablet Take 1 tablet (4 mg total) by mouth every 6 (six) hours. 04/29/23  Yes Setzer, Nena PARAS, PA-C  docusate sodium  (COLACE) 100 MG capsule Take 1 capsule (100 mg total) by mouth 2 (two) times daily. Patient taking differently: Take 100 mg by mouth daily. 10/22/22 10/22/23 Yes Lovick, Dreama SAILOR, MD  escitalopram  (LEXAPRO ) 20 MG tablet Take 1.5 tablets (30 mg total) by mouth daily. 04/29/23  Yes Setzer, Sandra J, PA-C  fentaNYL  (DURAGESIC ) 50 MCG/HR Place 1 patch onto the skin every 3 (three) days. 05/31/23  Yes Tina Pauletta BROCKS, MD  gabapentin  (NEURONTIN ) 300 MG capsule Take 3 capsules (900 mg total) by mouth 2 (two) times daily. 05/31/23  Yes Tina Pauletta BROCKS, MD  ibuprofen  (ADVIL ) 200 MG tablet Take 800 mg by mouth as needed for mild pain or moderate pain.   Yes [provider]   magnesium  oxide (MAG-OX) 400 (240 Mg) MG tablet Take 400 mg by mouth daily.   Yes [provider]  melatonin 3 MG TABS tablet Take 1 tablet (3 mg total) by mouth at bedtime as needed (insomnia). 04/29/23  Yes Setzer, Sandra J, PA-C  pantoprazole  (PROTONIX ) 40 MG tablet Take 1 tablet (40 mg total) by mouth 2 (two) times daily before a meal. 04/29/23 07/10/23 Yes Setzer, Nena PARAS, PA-C  QUEtiapine  (SEROQUEL ) 100 MG tablet Take 2 tablets (200 mg total) by mouth at bedtime. 04/29/23  Yes Setzer, Nena PARAS, PA-C  traZODone  (DESYREL ) 100 MG tablet Take 100 mg by mouth at bedtime. 07/10/23  Yes [provider]  amantadine  (SYMMETREL ) 100 MG capsule Take 1 capsule (100 mg total) by mouth daily. Patient not taking: Reported on 07/10/2023 04/30/23   Setzer, Sandra J, PA-C  HYDROcodone -acetaminophen  (NORCO) 5-325 MG tablet Take 1 tablet by mouth every 6 (six) hours as needed for moderate pain (pain score 4-6). Patient not taking: Reported on 07/10/2023 05/31/23   Tina Pauletta BROCKS, MD  lactulose  (CHRONULAC ) 10 GM/15ML solution Take 30 mLs (20 g total) by mouth 2 (two) times daily as needed for mild constipation, moderate constipation or severe constipation (use if inadequate response to Miralax ). Patient not taking: Reported on 07/10/2023 04/13/23   Leotis Bogus, MD  lidocaine  (LIDODERM ) 5 % Place 1 patch onto the skin daily. Remove & Discard patch  within 12 hours or as directed by MD Patient not taking: Reported on 07/10/2023 04/29/23   Setzer, Sandra J, PA-C      Allergies    Codeine and Penicillin g    Review of Systems   Review of Systems  Neurological:  Positive for weakness.    Physical Exam Updated Vital Signs BP 122/76   Pulse 82   Temp 98 F (36.7 C) (Oral)   Resp 16   Ht 5' 10 (1.778 m)   Wt 84.5 kg   SpO2 92%   BMI 26.73 kg/m  Physical Exam Vitals and nursing note reviewed.  Constitutional:      General: He is not in acute distress.    Appearance: Normal appearance.   HENT:     Head: Normocephalic and atraumatic.     Mouth/Throat:     Mouth: Mucous membranes are moist.  Cardiovascular:     Rate and Rhythm: Normal rate and regular rhythm.     Heart sounds: Murmur heard.  Pulmonary:     Effort: Pulmonary effort is normal.     Breath sounds: No wheezing, rhonchi or rales.  Abdominal:     General: Abdomen is flat.     Palpations: Abdomen is soft.     Tenderness: There is no abdominal tenderness.  Musculoskeletal:        General: Normal range of motion.     Cervical back: Normal range of motion and neck supple.     Comments: No tenderness of cervical or thoracic spine. There is a well healed midline surgical scar present over upper thoracic spine. No redness. No swelling.  Skin:    General: Skin is warm and dry.  Neurological:     Mental Status: He is alert and oriented to person, place, and time.     Sensory: No sensory deficit.     Comments: Decreased grip strength of left hand. Normal bicep/tricep strength.      ED Results / Procedures / Treatments   Labs (all labs ordered are listed, but only abnormal results are displayed) Labs Reviewed  CBC WITH DIFFERENTIAL/PLATELET - Abnormal; Notable for the following components:      Result Value   RBC 3.48 (*)    Hemoglobin 8.5 (*)    HCT 28.2 (*)    MCH 24.4 (*)    RDW 16.1 (*)    All other components within normal limits  COMPREHENSIVE METABOLIC PANEL - Abnormal; Notable for the following components:   Sodium 134 (*)    Glucose, Bld 102 (*)    Creatinine, Ser 0.60 (*)    Calcium  8.1 (*)    Total Protein 5.7 (*)    Albumin  2.7 (*)    All other components within normal limits  VITAMIN B12   Results for orders placed or performed during the hospital encounter of 07/10/23  CBC with Differential   Collection Time: 07/10/23 12:12 PM  Result Value Ref Range   WBC 4.1 4.0 - 10.5 K/uL   RBC 3.48 (L) 4.22 - 5.81 MIL/uL   Hemoglobin 8.5 (L) 13.0 - 17.0 g/dL   HCT 71.7 (L) 60.9 - 47.9 %   MCV  81.0 80.0 - 100.0 fL   MCH 24.4 (L) 26.0 - 34.0 pg   MCHC 30.1 30.0 - 36.0 g/dL   RDW 83.8 (H) 88.4 - 84.4 %   Platelets 291 150 - 400 K/uL   nRBC 0.0 0.0 - 0.2 %   Neutrophils Relative % 67 %   Neutro  Abs 2.8 1.7 - 7.7 K/uL   Lymphocytes Relative 18 %   Lymphs Abs 0.7 0.7 - 4.0 K/uL   Monocytes Relative 12 %   Monocytes Absolute 0.5 0.1 - 1.0 K/uL   Eosinophils Relative 2 %   Eosinophils Absolute 0.1 0.0 - 0.5 K/uL   Basophils Relative 1 %   Basophils Absolute 0.0 0.0 - 0.1 K/uL   Immature Granulocytes 0 %   Abs Immature Granulocytes 0.01 0.00 - 0.07 K/uL  Comprehensive metabolic panel   Collection Time: 07/10/23 12:12 PM  Result Value Ref Range   Sodium 134 (L) 135 - 145 mmol/L   Potassium 4.2 3.5 - 5.1 mmol/L   Chloride 100 98 - 111 mmol/L   CO2 25 22 - 32 mmol/L   Glucose, Bld 102 (H) 70 - 99 mg/dL   BUN 12 8 - 23 mg/dL   Creatinine, Ser 9.39 (L) 0.61 - 1.24 mg/dL   Calcium  8.1 (L) 8.9 - 10.3 mg/dL   Total Protein 5.7 (L) 6.5 - 8.1 g/dL   Albumin  2.7 (L) 3.5 - 5.0 g/dL   AST 21 15 - 41 U/L   ALT 33 0 - 44 U/L   Alkaline Phosphatase 76 38 - 126 U/L   Total Bilirubin 0.3 0.0 - 1.2 mg/dL   GFR, Estimated >39 >39 mL/min   Anion gap 9 5 - 15    EKG EKG Interpretation Date/Time:  Sunday July 10 2023 10:53:23 EST Ventricular Rate:  68 PR Interval:  201 QRS Duration:  99 QT Interval:  424 QTC Calculation: 451 R Axis:   -43  Text Interpretation: Sinus rhythm Left axis deviation No significant change since prior 9/24 Confirmed by Towana Sharper [phone]) on 07/10/2023 10:57:06 AM  Radiology MR Cervical Spine W and Wo Contrast Result Date: 07/10/2023 CLINICAL DATA:  Metastatic disease evaluation EXAM: MRI CERVICAL AND THORACIC SPINE WITHOUT AND WITH CONTRAST TECHNIQUE: Multiplanar and multiecho pulse sequences of the cervical spine, to include the craniocervical junction and cervicothoracic junction, and the thoracic spine, were obtained without and with intravenous  contrast. CONTRAST:  8mL GADAVIST  GADOBUTROL  1 MMOL/ML IV SOLN COMPARISON:  PET CT 05/04/23, MR T Spine 04/11/23 FINDINGS: MRI CERVICAL SPINE FINDINGS Alignment: Physiologic. Vertebrae: T1 hyperintense lesions are present at the C6 and C7 vertebral bodies, worirsome for osseous metastatic disease. T1 hypointense lesions are also present at the left-sided facet at C3. There is abnormal contrast enhancement in the ventral and dorsal epidural space spanning the C5-C7 vertebral body levels (series 7, image 10) Cord: Normal signal and morphology. Posterior Fossa, vertebral arteries, paraspinal tissues: Negative. Disc levels: There is severe spinal canal stenosis of the C5-C6, C6-C7, and C7-T1 levels. MRI THORACIC SPINE FINDINGS Alignment:  Physiologic. Vertebrae: There are multilevel T1 hypointense lesions lesions in the thoracic spine, most notably at T1-T5, T8, T10-T11. kyphoplasty changes at T3. Compared to prior exam there is a new/increased T2 hyperintense fluid collection in the dorsal epidural space spanning the T3-T6 levels with associated contrast enhancement. While this could represent extraosseous tumor, epidural phlegmon is not excluded. There is also increased contrast enhancement in the ventral epidural space spanning the T2-T5 levels (series 7, image 12). Cord: There is T2 hyperintense cord signal abnormality of the T2-T3 level, which appears slightly more conspicuous than on 03/29/2023 Paraspinal and other soft tissues: Compared to prior exam there is a new peripherally enhancing 3.2 x 2.4 x 2.8 cm T2 hyperintense fluid collection of the laminectomy site (series 7, image 13). This is unlikely represent  a postsurgical fluid collection as it was absent on recent prior MRI of the thoracic spine dated 04/11/2023. This is nonspecific, but infection is not excluded. There is a 1.9 cm retrocrural lymph node on the left (series 24, image 33), favored to represent nodal metastatic disease. This is new from PET CT  05/04/23. Disc levels: There is persistent severe spinal canal stenosis of the T2-T3 level (series 24, image 8). There is now moderate spinal canal narrowing of the T4-T5 levels. IMPRESSION: 1. Redemonstrated multiple osseous metastases spanning the C5 to the L1 vertebral bodies, as above. 2. New/increased T2 hyperintense contrast-enhancing material in the dorsal epidural space spanning the C5-T6 levels. While this could represent extraosseous tumor, epidural phlegmon is not excluded. 3. Increased contrast enhancement in the ventral epidural space spanning the C5-T5 levels, which could represent progressive epidural tumor, but infection is not excluded. 4. There is now severe spinal canal stenosis C5-T3 levels. 5. New peripherally enhancing 3.2 x 2.4 x 2.8 cm fluid collection at the laminectomy site. This was not present recent prior MRI of the thoracic spine dated 04/11/2023. Although this is nonspecific and a post-surgical fluid collection is a consideration, infection is not excluded. 6. New 1.8 cm retrocrural lymph node on the left. Consider further evaluation with a repeat CT chest abdomen/pelvis to better characterize the extent of metastatic disease. Electronically Signed   By: Lyndall Gore M.D.   On: 07/10/2023 14:20   MR THORACIC SPINE W WO CONTRAST Result Date: 07/10/2023 CLINICAL DATA:  Metastatic disease evaluation EXAM: MRI CERVICAL AND THORACIC SPINE WITHOUT AND WITH CONTRAST TECHNIQUE: Multiplanar and multiecho pulse sequences of the cervical spine, to include the craniocervical junction and cervicothoracic junction, and the thoracic spine, were obtained without and with intravenous contrast. CONTRAST:  8mL GADAVIST  GADOBUTROL  1 MMOL/ML IV SOLN COMPARISON:  PET CT 05/04/23, MR T Spine 04/11/23 FINDINGS: MRI CERVICAL SPINE FINDINGS Alignment: Physiologic. Vertebrae: T1 hyperintense lesions are present at the C6 and C7 vertebral bodies, worirsome for osseous metastatic disease. T1 hypointense lesions  are also present at the left-sided facet at C3. There is abnormal contrast enhancement in the ventral and dorsal epidural space spanning the C5-C7 vertebral body levels (series 7, image 10) Cord: Normal signal and morphology. Posterior Fossa, vertebral arteries, paraspinal tissues: Negative. Disc levels: There is severe spinal canal stenosis of the C5-C6, C6-C7, and C7-T1 levels. MRI THORACIC SPINE FINDINGS Alignment:  Physiologic. Vertebrae: There are multilevel T1 hypointense lesions lesions in the thoracic spine, most notably at T1-T5, T8, T10-T11. kyphoplasty changes at T3. Compared to prior exam there is a new/increased T2 hyperintense fluid collection in the dorsal epidural space spanning the T3-T6 levels with associated contrast enhancement. While this could represent extraosseous tumor, epidural phlegmon is not excluded. There is also increased contrast enhancement in the ventral epidural space spanning the T2-T5 levels (series 7, image 12). Cord: There is T2 hyperintense cord signal abnormality of the T2-T3 level, which appears slightly more conspicuous than on 03/29/2023 Paraspinal and other soft tissues: Compared to prior exam there is a new peripherally enhancing 3.2 x 2.4 x 2.8 cm T2 hyperintense fluid collection of the laminectomy site (series 7, image 13). This is unlikely represent a postsurgical fluid collection as it was absent on recent prior MRI of the thoracic spine dated 04/11/2023. This is nonspecific, but infection is not excluded. There is a 1.9 cm retrocrural lymph node on the left (series 24, image 33), favored to represent nodal metastatic disease. This is new from PET CT  05/04/23. Disc levels: There is persistent severe spinal canal stenosis of the T2-T3 level (series 24, image 8). There is now moderate spinal canal narrowing of the T4-T5 levels. IMPRESSION: 1. Redemonstrated multiple osseous metastases spanning the C5 to the L1 vertebral bodies, as above. 2. New/increased T2  hyperintense contrast-enhancing material in the dorsal epidural space spanning the C5-T6 levels. While this could represent extraosseous tumor, epidural phlegmon is not excluded. 3. Increased contrast enhancement in the ventral epidural space spanning the C5-T5 levels, which could represent progressive epidural tumor, but infection is not excluded. 4. There is now severe spinal canal stenosis C5-T3 levels. 5. New peripherally enhancing 3.2 x 2.4 x 2.8 cm fluid collection at the laminectomy site. This was not present recent prior MRI of the thoracic spine dated 04/11/2023. Although this is nonspecific and a post-surgical fluid collection is a consideration, infection is not excluded. 6. New 1.8 cm retrocrural lymph node on the left. Consider further evaluation with a repeat CT chest abdomen/pelvis to better characterize the extent of metastatic disease. Electronically Signed   By: Lyndall Gore M.D.   On: 07/10/2023 14:20    Procedures Procedures    Medications Ordered in ED Medications  HYDROmorphone  (DILAUDID ) injection 0.5 mg (0.5 mg Intravenous Given 07/10/23 1219)  gadobutrol  (GADAVIST ) 1 MMOL/ML injection 8 mL (8 mLs Intravenous Contrast Given 07/10/23 1327)  HYDROmorphone  (DILAUDID ) injection 1 mg (1 mg Intravenous Given 07/10/23 1241)  HYDROmorphone  (DILAUDID ) injection 1 mg (1 mg Intravenous Given 07/10/23 1405)    ED Course/ Medical Decision Making/ A&P Clinical Course as of 07/10/23 1554  Sun Jul 10, 2023  2760 68 year old male with history of prostate cancer with mets to spine and bone here with worsening left hand weakness and pain in his left shoulder and scapula.  He is getting some pain control and will need labs and imaging.  Disposition per results of testing [MB]    Clinical Course User Index [MB] Towana Ozell BROCKS, MD                                 Medical Decision Making This patient presents to the ED for concern of back pain, this involves an extensive number of treatment  options, and is a complaint that carries with it a high risk of complications and morbidity.  The differential diagnosis includes infection, fracture, MSK pain   Co morbidities that complicate the patient evaluation  Known spinal metastasis   Additional history obtained:  Additional history and/or information obtained from chart review, notable for Neurosurgical intervention 03/2023 - decompression laminectomy T3 (Dr. Onetha)   Lab Tests:  I Ordered, and personally interpreted labs.  The pertinent results include:  No leukocytosis; hgb 8.5 (stable);     Imaging Studies ordered:  I ordered imaging studies including MRI cervical and thoracic spine Per radiology: Extensive worsening of tumor burden vs infection C5-T6  Cardiac Monitoring:  The patient was maintained on a cardiac monitor.  I personally viewed and interpreted the cardiac monitored which showed an underlying rhythm of: Sinus rhythm   Medicines ordered and prescription drug management:  I ordered medication including dilaudid   for pain Reevaluation of the patient after these medicines showed that the patient improved I have reviewed the patients home medicines and have made adjustments as needed   Consultations Obtained:  I requested consultation with the Dr. Colon, neurosurgery,  and discussed lab and imaging findings as well as pertinent plan -  they recommend: Dr. Colon has reviewed the MRI results and reports there is no immediate neurosurgical intervention that would offer disease improvement. He will be involved with neuro-oncology discussion in the morning and will present this case. For now, advises admission to [facility] for pain management and oncology input is appropriate.    Problem List / ED Course:  Patient with prostate CA with mets involving multilevel spine, now with significantly worsening tumor progression and spinal stenosis.  Patient and family updated. Patient states he is interested in further  treatment options   Social Determinants of Health:  Good family support Well established medical care   Disposition:  After consideration of the diagnostic results and the patients response to treatment, I feel that the patient would benefit from Admit.   Amount and/or Complexity of Data Reviewed Labs: ordered. Radiology: ordered.  Risk Prescription drug management.           Final Clinical Impression(s) / ED Diagnoses Final diagnoses:  Malignant neoplasm metastatic to bone [facility])    Rx / DC Orders ED Discharge Orders     None         Odell Margit RIGGERS 07/10/23 1554    Towana Ozell BROCKS, MD 07/10/23 1724

## 2023-07-10 NOTE — Assessment & Plan Note (Deleted)
 Continue gabapentin 900mg  BID

## 2023-07-10 NOTE — Assessment & Plan Note (Deleted)
Continue PPI BID 

## 2023-07-10 NOTE — Assessment & Plan Note (Deleted)
 Continue lexapro and xanax PRN

## 2023-07-10 NOTE — ED Triage Notes (Signed)
 Pt reports decreased strength in left hand for a week that is worsening daily. Also reports extreme sharp pain in left shoulder that radiates down arm while trying to sleep. Family reports he is out of home pain meds and trouble getting refills. Reports frequent falls.

## 2023-07-10 NOTE — Assessment & Plan Note (Deleted)
-  palliative care consult -continue fentanyl patch 25mg  q 3 days. Last placed 07/07/22 -oral narcotics gave him no relief  -dilaudid PRN q 2 hours  -scheduled colace

## 2023-07-10 NOTE — Assessment & Plan Note (Deleted)
 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

## 2023-07-10 NOTE — Assessment & Plan Note (Deleted)
 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 Also new spinal canal stenosis at C5-T3. Severe at T2-T3.  -admit to progressive -neuro surgery consulted, Dr Colon who discussed with EDP no surgical option.  -Radiation oncology consulted, Dr. Shannon and will see tomorrow -added his primary oncologist, to care team, Dr. Tina and will send message. -start oral steroids. Dexamethasone  4mg  q 6 hours -pain control with his fentanyl  patch q 3 day and will have dilaudid  q 2 hour prn. Palliative care to help with pain management as he states he has no relief with oral narcotics.  -palliative care consult for GOC.

## 2023-07-10 NOTE — Assessment & Plan Note (Deleted)
-  s/p robotic assisted laparoscopic radical prostatectomy and bilateral lymph node dissection in March 2015  -ADT, enzalutamide , olaparib  -2015 salvage radiation with 6 months of ADT -Radiation Right iliac crest and the right internal iliac lymph node, T spine -failed docetaxel  due to toxicities. Started Pluvicto  12/31.  -Monitor PSA monthly -Last lupron  was on 10/16 at 30 mg. Resume in February per oncology  -Last zometa  on 10/16. Due January 2025 -have added his primary oncologist, Dr. Tina, to care team

## 2023-07-10 NOTE — H&P (Signed)
 History and Physical    Patient: Nathan Berg FMW:[id number] DOB: Nov 10, 1955 DOA: 07/10/2023 DOS: the patient was seen and examined on 07/10/2023 PCP: Lanny Callander, MD  Patient coming from: Home - lives with his wife. Uses a walker to ambulate.    Chief Complaint: left hand weakness, pain and frequent falls   HPI: Nathan Berg is a 68 y.o. male with medical history significant of stage IV prostate cancer, depression and anxiety, GERD who presented to ED with complaints of left upper extremity weakness and worsening back pain. He presented to hospital in 03/2023 with back pain and underwent decompressive laminectomy by neurosurgery for metastatic disease involving the thoracic spine with cord compression at that time. He tells me he was recently started on Pluvicto  for his prostate cancer. Last treatment 12/31. He noticed the last few days his left hand started to get really weak. He can not grip anything without it falling. He has slight numbness in his hand. He is right handed. He does feel like he has arm weakness and left lower leg weakness. He also states in his shoulder blade area and T4 area the pain has been excruciating. Rolling over made it worse. He called his oncologist a few days ago and they recommended he come to Ed, but he is having to take care of his wife and couldn't come at that time. They waited for his son to get free so they could bring him here to ED.     Denies any fever/chills, vision changes/headaches, chest pain or palpitations, shortness of breath or cough, abdominal pain, N/V/D, dysuria  + leg swelling.    He does not smoke or drink alcohol.   ER Course:  vitals: afebrile, bp: 119/76, HR: 69, RR: 15, oxygen: 98%RA Pertinent labs: hgb: 8.5,  Cervical/thoracic MRI:  Redemonstrated multiple osseous metastases spanning the C5 to the L1 vertebral bodies, as above. 2. New/increased T2 hyperintense contrast-enhancing material in the dorsal epidural space spanning the C5-T6  levels. While this could represent extraosseous tumor, epidural phlegmon is not excluded. 3. Increased contrast enhancement in the ventral epidural space spanning the C5-T5 levels, which could represent progressive epidural tumor, but infection is not excluded. 4. There is now severe spinal canal stenosis C5-T3 levels. 5. New peripherally enhancing 3.2 x 2.4 x 2.8 cm fluid collection at the laminectomy site. This was not present recent prior MRI of the thoracic spine dated 04/11/2023. Although this is nonspecific and a post-surgical fluid collection is a consideration, infection is not excluded. 6. New 1.8 cm retrocrural lymph node on the left. Consider further evaluation with a repeat CT chest abdomen/pelvis to better characterize the extent of metastatic disease. 7. Severe spinal canal stenosis of the t2-T3 and now moderate spinal canal narrowing of the T4-T5 levels.  In ED: neurosurgery consulted. TRH asked to admit. Given dilaudid  for pain.    Review of Systems: As mentioned in the history of present illness. All other systems reviewed and are negative. Past Medical History:  Diagnosis Date   Anxiety    Arthritis    Cancer (HCC)    Prostate cancer   Depression    GERD (gastroesophageal reflux disease)    H/O seasonal allergies    History of kidney stones    x1   Prostate cancer [facility])    Past Surgical History:  Procedure Laterality Date   CHEST TUBE INSERTION  07/16/2019   trauma    COLONOSCOPY     HERNIA REPAIR Bilateral    20  yrs ago   LAMINECTOMY N/A 03/31/2023   Procedure: THORACIC LAMINECTOMY FOR TUMOR;  Surgeon: Onetha Kuba, MD;  Location: [facility] OR;  Service: Neurosurgery;  Laterality: N/A;   LYMPHADENECTOMY Bilateral 09/19/2013   Procedure: LYMPHADENECTOMY  PELVIC LYMPH NODE DISSECTION;  Surgeon: Alm GORMAN Fragmin, MD;  Location: [facility] ORS;  Service: Urology;  Laterality: Bilateral;   PROSTATE BIOPSY     ROBOT ASSISTED LAPAROSCOPIC RADICAL PROSTATECTOMY N/A 09/19/2013    Procedure: ROBOTIC ASSISTED LAPAROSCOPIC RADICAL PROSTATECTOMY;  Surgeon: Alm GORMAN Fragmin, MD;  Location: [facility] ORS;  Service: Urology;  Laterality: N/A;   TONSILLECTOMY     child   VARICOCELE EXCISION     Social History:  reports that he has never smoked. He has never used smokeless tobacco. He reports current alcohol use. He reports that he does not use drugs.  Allergies  Allergen Reactions   Codeine Nausea And Vomiting   Penicillin G Other (See Comments)    Stomach upset    Family History  Problem Relation Age of Onset   Prostate cancer Father    Breast cancer Neg Hx    Colon cancer Neg Hx    Pancreatic cancer Neg Hx     Prior to Admission medications   Medication Sig Start Date End Date Taking? Authorizing Provider  ALPRAZolam  (XANAX ) 1 MG tablet TAKE 1/2 TO 1 TABLET BY MOUTH TWICE DAILY AS NEEDED FOR ANXIETY. MUST HAVE OFFICE OR TELEMEDICINE VISIT FOR REFILLS 04/29/23  Yes Setzer, Sandra J, PA-C  cyanocobalamin  1000 MCG tablet Take 1 tablet (1,000 mcg total) by mouth daily. 04/30/23  Yes Setzer, Sandra J, PA-C  dexamethasone  (DECADRON ) 4 MG tablet Take 1 tablet (4 mg total) by mouth every 6 (six) hours. 04/29/23  Yes Setzer, Nena PARAS, PA-C  docusate sodium  (COLACE) 100 MG capsule Take 1 capsule (100 mg total) by mouth 2 (two) times daily. Patient taking differently: Take 100 mg by mouth daily. 10/22/22 10/22/23 Yes Lovick, Dreama SAILOR, MD  escitalopram  (LEXAPRO ) 20 MG tablet Take 1.5 tablets (30 mg total) by mouth daily. 04/29/23  Yes Setzer, Sandra J, PA-C  fentaNYL  (DURAGESIC ) 50 MCG/HR Place 1 patch onto the skin every 3 (three) days. 05/31/23  Yes Tina Pauletta BROCKS, MD  gabapentin  (NEURONTIN ) 300 MG capsule Take 3 capsules (900 mg total) by mouth 2 (two) times daily. 05/31/23  Yes Tina Pauletta BROCKS, MD  ibuprofen  (ADVIL ) 200 MG tablet Take 800 mg by mouth as needed for mild pain or moderate pain.   Yes [provider]  magnesium  oxide (MAG-OX) 400 (240 Mg) MG tablet Take 400 mg  by mouth daily.   Yes [provider]  melatonin 3 MG TABS tablet Take 1 tablet (3 mg total) by mouth at bedtime as needed (insomnia). 04/29/23  Yes Setzer, Nena PARAS, PA-C  pantoprazole  (PROTONIX ) 40 MG tablet Take 1 tablet (40 mg total) by mouth 2 (two) times daily before a meal. 04/29/23 07/10/23 Yes Setzer, Nena PARAS, PA-C  QUEtiapine  (SEROQUEL ) 100 MG tablet Take 2 tablets (200 mg total) by mouth at bedtime. 04/29/23  Yes Setzer, Nena PARAS, PA-C  traZODone  (DESYREL ) 100 MG tablet Take 100 mg by mouth at bedtime. 07/10/23  Yes [provider]  amantadine  (SYMMETREL ) 100 MG capsule Take 1 capsule (100 mg total) by mouth daily. Patient not taking: Reported on 07/10/2023 04/30/23   Setzer, Sandra J, PA-C  HYDROcodone -acetaminophen  (NORCO) 5-325 MG tablet Take 1 tablet by mouth every 6 (six) hours as needed for moderate pain (pain score 4-6).  Patient not taking: Reported on 07/10/2023 05/31/23   Tina Pauletta BROCKS, MD  lactulose  (CHRONULAC ) 10 GM/15ML solution Take 30 mLs (20 g total) by mouth 2 (two) times daily as needed for mild constipation, moderate constipation or severe constipation (use if inadequate response to Miralax ). Patient not taking: Reported on 07/10/2023 04/13/23   Leotis Bogus, MD  lidocaine  (LIDODERM ) 5 % Place 1 patch onto the skin daily. Remove & Discard patch within 12 hours or as directed by MD Patient not taking: Reported on 07/10/2023 04/29/23   Rosendo Nena PARAS, PA-C    Physical Exam: Vitals:   07/10/23 1053 07/10/23 1200 07/10/23 1435 07/10/23 1500  BP: 119/76 134/84  122/76  Pulse: 69 71  82  Resp: 15 16  16   Temp: 97.6 F (36.4 C)  98 F (36.7 C)   TempSrc: Oral  Oral   SpO2: 98% 96%  92%  Weight:      Height:       General:  Appears calm and comfortable and is in NAD Eyes:  PERRL, EOMI, normal lids, iris ENT:  grossly normal hearing, lips & tongue, mmm; appropriate dentition Neck:  no LAD, masses or thyromegaly; no carotid bruits Cardiovascular:  RRR,  loud systolic murmur. 2+ LE edema.  Respiratory:   CTA bilaterally with no wheezes/rales/rhonchi.  Normal respiratory effort. Abdomen:  soft, NT, ND, NABS Back:   normal alignment, no CVAT Skin:  no rash or induration seen on limited exam Musculoskeletal: LUE: decreased grip strength in left hand: 3/5, decreased strength in LUE: 4/5. LLE: 4/5. 5/5 in RUE and RLE.  Lower extremity:   Limited foot exam with no ulcerations.  2+ distal pulses. Psychiatric:  grossly normal mood and affect, speech fluent and appropriate, AOx3 Neurologic:  CN 2-12 grossly intact, moves all extremities in coordinated fashion, sensation intact   Radiological Exams on Admission: Independently reviewed - see discussion in A/P where applicable  MR Cervical Spine W and Wo Contrast Result Date: 07/10/2023 CLINICAL DATA:  Metastatic disease evaluation EXAM: MRI CERVICAL AND THORACIC SPINE WITHOUT AND WITH CONTRAST TECHNIQUE: Multiplanar and multiecho pulse sequences of the cervical spine, to include the craniocervical junction and cervicothoracic junction, and the thoracic spine, were obtained without and with intravenous contrast. CONTRAST:  8mL GADAVIST  GADOBUTROL  1 MMOL/ML IV SOLN COMPARISON:  PET CT 05/04/23, MR T Spine 04/11/23 FINDINGS: MRI CERVICAL SPINE FINDINGS Alignment: Physiologic. Vertebrae: T1 hyperintense lesions are present at the C6 and C7 vertebral bodies, worirsome for osseous metastatic disease. T1 hypointense lesions are also present at the left-sided facet at C3. There is abnormal contrast enhancement in the ventral and dorsal epidural space spanning the C5-C7 vertebral body levels (series 7, image 10) Cord: Normal signal and morphology. Posterior Fossa, vertebral arteries, paraspinal tissues: Negative. Disc levels: There is severe spinal canal stenosis of the C5-C6, C6-C7, and C7-T1 levels. MRI THORACIC SPINE FINDINGS Alignment:  Physiologic. Vertebrae: There are multilevel T1 hypointense lesions lesions in the  thoracic spine, most notably at T1-T5, T8, T10-T11. kyphoplasty changes at T3. Compared to prior exam there is a new/increased T2 hyperintense fluid collection in the dorsal epidural space spanning the T3-T6 levels with associated contrast enhancement. While this could represent extraosseous tumor, epidural phlegmon is not excluded. There is also increased contrast enhancement in the ventral epidural space spanning the T2-T5 levels (series 7, image 12). Cord: There is T2 hyperintense cord signal abnormality of the T2-T3 level, which appears slightly more conspicuous than on 03/29/2023 Paraspinal and other soft tissues: Compared  to prior exam there is a new peripherally enhancing 3.2 x 2.4 x 2.8 cm T2 hyperintense fluid collection of the laminectomy site (series 7, image 13). This is unlikely represent a postsurgical fluid collection as it was absent on recent prior MRI of the thoracic spine dated 04/11/2023. This is nonspecific, but infection is not excluded. There is a 1.9 cm retrocrural lymph node on the left (series 24, image 33), favored to represent nodal metastatic disease. This is new from PET CT 05/04/23. Disc levels: There is persistent severe spinal canal stenosis of the T2-T3 level (series 24, image 8). There is now moderate spinal canal narrowing of the T4-T5 levels. IMPRESSION: 1. Redemonstrated multiple osseous metastases spanning the C5 to the L1 vertebral bodies, as above. 2. New/increased T2 hyperintense contrast-enhancing material in the dorsal epidural space spanning the C5-T6 levels. While this could represent extraosseous tumor, epidural phlegmon is not excluded. 3. Increased contrast enhancement in the ventral epidural space spanning the C5-T5 levels, which could represent progressive epidural tumor, but infection is not excluded. 4. There is now severe spinal canal stenosis C5-T3 levels. 5. New peripherally enhancing 3.2 x 2.4 x 2.8 cm fluid collection at the laminectomy site. This was not  present recent prior MRI of the thoracic spine dated 04/11/2023. Although this is nonspecific and a post-surgical fluid collection is a consideration, infection is not excluded. 6. New 1.8 cm retrocrural lymph node on the left. Consider further evaluation with a repeat CT chest abdomen/pelvis to better characterize the extent of metastatic disease. Electronically Signed   By: Lyndall Gore M.D.   On: 07/10/2023 14:20   MR THORACIC SPINE W WO CONTRAST Result Date: 07/10/2023 CLINICAL DATA:  Metastatic disease evaluation EXAM: MRI CERVICAL AND THORACIC SPINE WITHOUT AND WITH CONTRAST TECHNIQUE: Multiplanar and multiecho pulse sequences of the cervical spine, to include the craniocervical junction and cervicothoracic junction, and the thoracic spine, were obtained without and with intravenous contrast. CONTRAST:  8mL GADAVIST  GADOBUTROL  1 MMOL/ML IV SOLN COMPARISON:  PET CT 05/04/23, MR T Spine 04/11/23 FINDINGS: MRI CERVICAL SPINE FINDINGS Alignment: Physiologic. Vertebrae: T1 hyperintense lesions are present at the C6 and C7 vertebral bodies, worirsome for osseous metastatic disease. T1 hypointense lesions are also present at the left-sided facet at C3. There is abnormal contrast enhancement in the ventral and dorsal epidural space spanning the C5-C7 vertebral body levels (series 7, image 10) Cord: Normal signal and morphology. Posterior Fossa, vertebral arteries, paraspinal tissues: Negative. Disc levels: There is severe spinal canal stenosis of the C5-C6, C6-C7, and C7-T1 levels. MRI THORACIC SPINE FINDINGS Alignment:  Physiologic. Vertebrae: There are multilevel T1 hypointense lesions lesions in the thoracic spine, most notably at T1-T5, T8, T10-T11. kyphoplasty changes at T3. Compared to prior exam there is a new/increased T2 hyperintense fluid collection in the dorsal epidural space spanning the T3-T6 levels with associated contrast enhancement. While this could represent extraosseous tumor, epidural phlegmon  is not excluded. There is also increased contrast enhancement in the ventral epidural space spanning the T2-T5 levels (series 7, image 12). Cord: There is T2 hyperintense cord signal abnormality of the T2-T3 level, which appears slightly more conspicuous than on 03/29/2023 Paraspinal and other soft tissues: Compared to prior exam there is a new peripherally enhancing 3.2 x 2.4 x 2.8 cm T2 hyperintense fluid collection of the laminectomy site (series 7, image 13). This is unlikely represent a postsurgical fluid collection as it was absent on recent prior MRI of the thoracic spine dated 04/11/2023. This is nonspecific, but  infection is not excluded. There is a 1.9 cm retrocrural lymph node on the left (series 24, image 33), favored to represent nodal metastatic disease. This is new from PET CT 05/04/23. Disc levels: There is persistent severe spinal canal stenosis of the T2-T3 level (series 24, image 8). There is now moderate spinal canal narrowing of the T4-T5 levels. IMPRESSION: 1. Redemonstrated multiple osseous metastases spanning the C5 to the L1 vertebral bodies, as above. 2. New/increased T2 hyperintense contrast-enhancing material in the dorsal epidural space spanning the C5-T6 levels. While this could represent extraosseous tumor, epidural phlegmon is not excluded. 3. Increased contrast enhancement in the ventral epidural space spanning the C5-T5 levels, which could represent progressive epidural tumor, but infection is not excluded. 4. There is now severe spinal canal stenosis C5-T3 levels. 5. New peripherally enhancing 3.2 x 2.4 x 2.8 cm fluid collection at the laminectomy site. This was not present recent prior MRI of the thoracic spine dated 04/11/2023. Although this is nonspecific and a post-surgical fluid collection is a consideration, infection is not excluded. 6. New 1.8 cm retrocrural lymph node on the left. Consider further evaluation with a repeat CT chest abdomen/pelvis to better characterize the  extent of metastatic disease. Electronically Signed   By: Lyndall Gore M.D.   On: 07/10/2023 14:20    EKG: Independently reviewed.  NSR with rate 68; nonspecific ST changes with no evidence of acute ischemia   Labs on Admission: I have personally reviewed the available labs and imaging studies at the time of the admission.  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 Also new spinal canal stenosis at C5-T3. Severe at T2-T3.  -admit to progressive -neuro surgery consulted, Dr Colon who discussed with EDP no surgical option.  -Radiation oncology consulted, Dr. Shannon and will see tomorrow -added his primary oncologist, to care team, Dr. Tina and will send message. -start oral steroids. Dexamethasone  4mg  q 6 hours -pain control with his fentanyl  patch q 3 day and will have dilaudid  q 2 hour prn. Palliative care to help with pain management as he states he has no relief with oral narcotics.  -palliative care consult for GOC.   Prostate cancer metastatic to bone [facility]) -s/p robotic assisted laparoscopic radical prostatectomy and bilateral lymph node dissection in March 2015   -ADT, enzalutamide , olaparib  -2015 salvage radiation with 6 months of ADT -Radiation Right iliac crest and the right internal iliac lymph node, T spine -failed docetaxel  due to toxicities. Started Pluvicto  12/31.  -Monitor PSA monthly -Last lupron  was on 10/16 at 30 mg. Resume in February per oncology  -Last zometa  on 10/16. Due January 2025 -have added his primary oncologist, Dr. Tina, to care team   Anemia Likely component  of ACD with  metastatic cancer and IDA  Check iron studies Check B12 (hx of low B12, continue oral replacement) Stable around baseline   Cancer related pain -palliative care consult -continue fentanyl  patch 25mg  q 3 days. Last placed 07/07/22 -oral narcotics gave him no relief  -dilaudid  PRN q 2 hours  -scheduled colace   chemo induced Neuropathy Continue gabapentin  900mg  BID   anxiety and depression Continue lexapro  and xanax  PRN   GERD (gastroesophageal reflux disease) Continue PPI BID  Low serum vitamin B12 Repeat B12, continue oral replacement     Advance Care Planning:   Code Status: Limited: Do not attempt resuscitation (DNR) -DNR-LIMITED -Do Not Intubate/DNI    Consults: neurosurgery: Dr. Colon, oncology, palliative care, radiation-oncology: Dr. Shannon  DVT Prophylaxis: SCDs  Family Communication: none   Severity of Illness: The appropriate patient status for this patient is INPATIENT. Inpatient status is judged to be reasonable and necessary in order to provide the required intensity of service to ensure the patient's safety. The patient's presenting symptoms, physical exam findings, and initial radiographic and laboratory data in the context of their chronic comorbidities is felt to place them at high risk for further clinical deterioration. Furthermore, it is not anticipated that the patient will be medically stable for discharge from the hospital within 2 midnights of admission.   * I certify that at the point of admission it is my  clinical judgment that the patient will require inpatient hospital care spanning beyond 2 midnights from the point of admission due to high intensity of service, high risk for further deterioration and high frequency of surveillance required.*  Author: Isaiah Geralds, MD 07/10/2023 5:11 PM  For on call review [email or web address].

## 2023-07-11 ENCOUNTER — Other Ambulatory Visit: Payer: Self-pay

## 2023-07-11 ENCOUNTER — Ambulatory Visit
Admit: 2023-07-11 | Discharge: 2023-07-11 | Disposition: A | Discharge status: Home / Self Care | Payer: Medicare Other | Attending: Radiation Oncology | Admitting: Radiation Oncology

## 2023-07-11 ENCOUNTER — Ambulatory Visit
Admission: RE | Admit: 2023-07-11 | Discharge: 2023-07-11 | Disposition: A | Discharge status: Home / Self Care | Payer: Medicare Other | Source: Ambulatory Visit | Attending: Radiation Oncology | Admitting: Radiation Oncology

## 2023-07-11 DIAGNOSIS — Z515 Encounter for palliative care: Secondary | ICD-10-CM | POA: Diagnosis not present

## 2023-07-11 DIAGNOSIS — D508 Other iron deficiency anemias: Secondary | ICD-10-CM | POA: Diagnosis not present

## 2023-07-11 DIAGNOSIS — Z7189 Other specified counseling: Secondary | ICD-10-CM

## 2023-07-11 DIAGNOSIS — E538 Deficiency of other specified B group vitamins: Secondary | ICD-10-CM | POA: Diagnosis not present

## 2023-07-11 DIAGNOSIS — C7951 Secondary malignant neoplasm of bone: Secondary | ICD-10-CM | POA: Insufficient documentation

## 2023-07-11 DIAGNOSIS — G893 Neoplasm related pain (acute) (chronic): Secondary | ICD-10-CM

## 2023-07-11 DIAGNOSIS — G629 Polyneuropathy, unspecified: Secondary | ICD-10-CM

## 2023-07-11 DIAGNOSIS — G9520 Unspecified cord compression: Secondary | ICD-10-CM | POA: Diagnosis not present

## 2023-07-11 DIAGNOSIS — C61 Malignant neoplasm of prostate: Secondary | ICD-10-CM | POA: Insufficient documentation

## 2023-07-11 LAB — BASIC METABOLIC PANEL
Anion gap: 7 (ref 5–15)
BUN: 11 mg/dL (ref 8–23)
CO2: 24 mmol/L (ref 22–32)
Calcium: 8.1 mg/dL — ABNORMAL LOW (ref 8.9–10.3)
Chloride: 102 mmol/L (ref 98–111)
Creatinine, Ser: 0.49 mg/dL — ABNORMAL LOW (ref 0.61–1.24)
GFR, Estimated: >60 mL/min (ref >60)
Glucose, Bld: 117 mg/dL — ABNORMAL HIGH (ref 70–99)
Potassium: 4.1 mmol/L (ref 3.5–5.1)
Sodium: 133 mmol/L — ABNORMAL LOW (ref 135–145)

## 2023-07-11 LAB — RAD ONC ARIA SESSION SUMMARY
Course Elapsed Days: 0
Plan Fractions Treated to Date: 1
Plan Prescribed Dose Per Fraction: 3 Gy
Plan Total Fractions Prescribed: 10
Plan Total Prescribed Dose: 30 Gy
Reference Point Dosage Given to Date: 3 Gy
Reference Point Session Dosage Given: 3 Gy
Session Number: 1

## 2023-07-11 LAB — CBC
HCT: 28.6 % — ABNORMAL LOW (ref 39.0–52.0)
Hemoglobin: 8.7 g/dL — ABNORMAL LOW (ref 13.0–17.0)
MCH: 24.6 pg — ABNORMAL LOW (ref 26.0–34.0)
MCHC: 30.4 g/dL (ref 30.0–36.0)
MCV: 81 fL (ref 80.0–100.0)
Platelets: 299 10*3/uL (ref 150–400)
RBC: 3.53 MIL/uL — ABNORMAL LOW (ref 4.22–5.81)
RDW: 16.3 % — ABNORMAL HIGH (ref 11.5–15.5)
WBC: 3.2 10*3/uL — ABNORMAL LOW (ref 4.0–10.5)
nRBC: 0 % (ref 0.0–0.2)

## 2023-07-11 MED ORDER — HYDROMORPHONE HCL 1 MG/ML IJ SOLN
0.5000 mg | INTRAMUSCULAR | Status: DC | PRN
  Administered 2023-07-11 – 2023-07-13 (×7): 0.5 mg via INTRAVENOUS
  Filled 2023-07-11 (×7): qty 0.5

## 2023-07-11 MED ORDER — FOLIC ACID 1 MG PO TABS
1.0000 mg | ORAL_TABLET | Freq: Every day | ORAL | Status: DC
  Administered 2023-07-11 – 2023-07-13 (×3): 1 mg via ORAL
  Filled 2023-07-11 (×3): qty 1

## 2023-07-11 MED ORDER — NA FERRIC GLUC CPLX IN SUCROSE 12.5 MG/ML IV SOLN
250.0000 mg | Freq: Once | INTRAVENOUS | Status: AC
  Administered 2023-07-11: 250 mg via INTRAVENOUS
  Filled 2023-07-11: qty 20

## 2023-07-11 NOTE — Progress Notes (Signed)
  Radiation Oncology         ([phone]) [phone] ________________________________  Name: LEXINGTON KROTZ MRN: [id number]  Date: 07/11/2023  DOB: [birth year]  SIMULATION AND TREATMENT PLANNING NOTE    ICD-10-CM   1. Prostate cancer metastatic to bone [facility])  C61    C79.51       DIAGNOSIS:  68 yo man with spinal cord compression from circumferential epidural metastatic castration resistant prostate cancer from C5-T5 with pain and left hand weakness  NARRATIVE:  The patient was brought to the CT Simulation planning suite.  Identity was confirmed.  All relevant records and images related to the planned course of therapy were reviewed.  The patient freely provided informed written consent to proceed with treatment after reviewing the details related to the planned course of therapy. The consent form was witnessed and verified by the simulation staff.  Then, the patient was set-up in a stable reproducible  supine position for radiation therapy.  CT images were obtained.  Surface markings were placed.  The CT images were loaded into the planning software.  Then the target and avoidance structures were contoured.  Treatment planning then occurred.  The radiation prescription was entered and confirmed.  Then, I designed and supervised the construction multple medically necessary complex treatment devices as defined in the plan.  I have requested : 3D Simulation  I have requested a DVH of the following structures: spinal cord, skin, lungs.  SPECIAL TREATMENT PROCEDURE:  The planned course of therapy using radiation constitutes a special treatment procedure. Special care is required in the management of this patient for the following reasons. This treatment constitutes a Special Treatment Procedure for the following reason: [ Retreatment in a previously radiated area requiring careful monitoring of increased risk of toxicity due to overlap of previous treatment..  The special nature of the planned course of radiotherapy  will require increased physician supervision and oversight to ensure patient's safety with optimal treatment outcomes.  This will require extended time and effort from me.  PLAN:  The patient will receive 30 Gy in 10 fractions to the epidural tumor from C5 to T5 plus some margin.  ________________________________  Donnice LABOR. Patrcia, M.D.

## 2023-07-11 NOTE — Consult Note (Signed)
 Consultation Note Date: 07/11/2023   Patient Name: Nathan Berg  DOB: August 21, 1955  MRN: [id number]  Age / Sex: 68 y.o., male  PCP: Lanny Callander, MD Referring Physician: Jadine Toribio SQUIBB, MD  Reason for Consultation: Establishing goals of care and Pain control  HPI/Patient Profile: 68 y.o. male  with past medical history of stage IV prostate cancer, depression, anxiety, GERD, s/p laminectomy due to thoracic spine cord compression s/t metastatic disease 03/2023 admitted on 07/10/2023 with left hand weakness, pain, falls related to progression of cancer with epidural tumor spanning C5-T5 causing cord compression. No surgical options.   Clinical Assessment and Goals of Care: Consult received and chart review completed. I met today with Nathan Berg and no visitors at bedside. He has good understanding of his situation and progression of his cancer. He spoke with Dr. Tina and is hoping for radiation therapy intervention to help so he can continue cancer treatment. He just started Pluvicto  07/05/23 and is hoping to continue. He reports overall good functional status and able to walk and do what he needs to do. His weakness has been more in his left hand. He is able to hold cup and feed self during my visit. He has not eaten much today. He has DNR in place.   We reviewed his pain. PDMP reviewed. He has fentanyl  patch 50 mcg/hr. Seems he uses ibuprofen  for breakthrough pain?? When discussing oral opioid medications that he has tried previously he talks of mostly what he has been given inpatient on previous admissions. Noted OxyIR 5 mg prescribed 05/16/23 and hydrocodone -acetaminophen  5-325 mg prescribed 05/31/23. He does report he tried the OxyIR and this did not help. He does report some level of relief from IV dilaudid . He has already been on steroids - not sure escalating now would be of great benefit. Pain currently 7/10. He is  unsure of what level is tolerable for him. He reports pain as left chest and wrapping around shoulder and into back and through. We discussed that there seems to be many options for oral opioid breakthrough pain that we have not tried yet. He is open to suggestions and trials.   Our conversation was cut short as he was collected for radiation therapy appointment. We agree to revisit pain options tomorrow.   Primary Decision Maker PATIENT    SUMMARY OF RECOMMENDATIONS   - DNR - Wishes to continue treatment - Pursuing radiation therapy - Hopeful for improved pain management  Code Status/Advance Care Planning: DNR   Symptom Management:  Left chest/back pain: Fentanyl  patch 50 mcg/hr Dilaudid  IV 0.5-1 mg q2h PRN Gabapentin  900 mg BID Robaxin  750 mg q8h PRN Recommend transition from IV dilaudid  to OxyIR 10 mg (if opioid rotation needed may consider morphine  7.5 mg or dilaudid  2 mg) q3-4h PRN Bowel Regimen:  Senokot 1 tab daily. May titrate as needed.   Prognosis:  Overall prognosis poor.   Discharge Planning: To Be Determined      Primary Diagnoses: Present on Admission:  anxiety and depression  Cancer  related pain  (Resolved) Primary hypertension  Malignant neoplasm metastatic to bone (HCC)  Low serum vitamin B12  Anemia  Prostate cancer metastatic to bone [facility])   I have reviewed the medical record, interviewed the patient and family, and examined the patient. The following aspects are pertinent.  Past Medical History:  Diagnosis Date   Anxiety    Arthritis    Cancer (HCC)    Prostate cancer   Depression    GERD (gastroesophageal reflux disease)    H/O seasonal allergies    History of kidney stones    x1   Prostate cancer (HCC)    Social History   Socioeconomic History   Marital status: Married    Spouse name: Not on file   Number of children: Not on file   Years of education: Not on file   Highest education level: Not on file  Occupational History    Not on file  Tobacco Use   Smoking status: Never   Smokeless tobacco: Never  Vaping Use   Vaping status: Never Used  Substance and Sexual Activity   Alcohol use: Yes    Comment: 4 beers per week   Drug use: No   Sexual activity: Yes  Other Topics Concern   Not on file  Social History Narrative   Not on file   Social Drivers of Health   Financial Resource Strain: Not on file  Food Insecurity: No Food Insecurity (07/11/2023)   Hunger Vital Sign    Worried About Running Out of Food in the Last Year: Never true    Ran Out of Food in the Last Year: Never true  Transportation Needs: No Transportation Needs (07/11/2023)   PRAPARE - Administrator, Civil Service (Medical): No    Lack of Transportation (Non-Medical): No  Physical Activity: Not on file  Stress: Not on file  Social Connections: Moderately Isolated (07/11/2023)   Social Connection and Isolation Panel [NHANES]    Frequency of Communication with Friends and Family: More than three times a week    Frequency of Social Gatherings with Friends and Family: Once a week    Attends Religious Services: Never    Database Administrator or Organizations: No    Attends Engineer, Structural: Never    Marital Status: Married   Family History  Problem Relation Age of Onset   Prostate cancer Father    Breast cancer Neg Hx    Colon cancer Neg Hx    Pancreatic cancer Neg Hx    Scheduled Meds:  vitamin B-12  1,000 mcg Oral Daily   dexamethasone   4 mg Oral QID   docusate sodium   100 mg Oral Daily   escitalopram   30 mg Oral Daily   fentaNYL   1 patch Transdermal Q72H   folic acid   1 mg Oral Daily   gabapentin   900 mg Oral BID   magnesium  oxide  400 mg Oral Daily   pantoprazole   40 mg Oral BID AC   QUEtiapine   200 mg Oral QHS   traZODone   100 mg Oral QHS   Continuous Infusions:  ferric gluconate (FERRLECIT) IVPB     PRN Meds:.acetaminophen  **OR** acetaminophen , ALPRAZolam , HYDROmorphone  (DILAUDID ) injection,  melatonin, methocarbamol , ondansetron  **OR** ondansetron  (ZOFRAN ) IV Allergies  Allergen Reactions   Codeine Nausea And Vomiting   Penicillin G Other (See Comments)    Stomach upset   Review of Systems  Constitutional:  Positive for activity change and appetite change.  Neurological:  Positive  for weakness.    Physical Exam Vitals and nursing note reviewed.  Constitutional:      General: He is awake. He is not in acute distress. Cardiovascular:     Rate and Rhythm: Normal rate.  Pulmonary:     Effort: No tachypnea, accessory muscle usage or respiratory distress.  Abdominal:     General: Abdomen is flat.  Neurological:     Mental Status: He is alert and oriented to person, place, and time.     Vital Signs: BP 106/67 (BP Location: Left Arm)   Pulse 78   Temp 97.8 F (36.6 C) (Oral)   Resp 14   Ht 5' 10 (1.778 m)   Wt 84.5 kg   SpO2 95%   BMI 26.73 kg/m  Pain Scale: 0-10   Pain Score: 4    SpO2: SpO2: 95 % O2 Device:SpO2: 95 % O2 Flow Rate: .   IO: Intake/output summary: No intake or output data in the 24 hours ending 07/11/23 1243  LBM: Last BM Date : 07/10/23 Baseline Weight: Weight: 84.5 kg Most recent weight: Weight: 84.5 kg     Palliative Assessment/Data:     Time Total: 55 min  Greater than 50%  of this time was spent counseling and coordinating care related to the above assessment and plan.  Signed by: Bernarda Kitty, NP Palliative Medicine Team Pager # [phone] (M-F 8a-5p) Team Phone # [phone] (Nights/Weekends)

## 2023-07-11 NOTE — Hospital Course (Addendum)
 68 year old man PMH including stage IV prostate cancer (S/P Badik assisted laparoscopic radical prostatectomy and bilateral lymph node dissection 09/2013, last Lupron  dose 10/16, last Zometa  10/16, follows with Dr. Tina with oncology) presented with left upper extremity weakness and worsening back pain.  Previously in the hospital in September of last year and underwent decompressive laminectomy for metastatic disease involving the thoracic spine with cord compression at that time.  Patient has now presented to [facility] emergency department of 07/10/2023 with complaints of worsening left upper extremity weakness and intractable back pain.    Upon evaluation in the emergency department developed left hand weakness and inability to grip things as well as some numbness.  Imaging revealed metastatic disease spanning from C5-L1 vertebral bodies.  EDP discussed with Dr. Colon who who recommended nonoperative management.  The hospitalist group was then called to assess the patient for admission to the hospital.    Dr. Tina with oncology was consulted and incoordination with Dr. Patrcia with radiation oncology patient was initiated on radiation therapy with the intention to complete a total of 10 doses.  Furthermore, palliative care was consulted and patient symptoms were managed with a combination of gabapentin , fentanyl  patch, Robaxin  and as needed oral and intravenous opiate based analgesics.    In the days that followed patient's symptoms and weakness slightly improved.  In light of the patient's improving clinical symptoms and need for ongoing radiation treatment patient is being at home in improved and stable condition on 07/13/2023 for ongoing care.  Patient is being discharged home with a regimen of fentanyl  patches, gabapentin , as needed Robaxin  and as needed oral Dilaudid  for breakthrough pain.  Patient is also being discharged home on a tapering regimen of dexamethasone .

## 2023-07-11 NOTE — Progress Notes (Signed)
 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 Prostate cancer metastatic to bone [facility])  C61    C79.51       HISTORY OF PRESENT ILLNESS::Nathan Berg is a 68 y.o. male well known to me from previous courses of radiotherapy, including two courses of radiation involving the upper thoracic spine.  He now presents with severe pain and losing grip strength in left hand.  MRI was done. He has thick enhancing epidural tumor encasing and compressing spinal cord from C5 to T5.  He has had spinal radiation at the T3-T4 level twice before.      PREVIOUS RADIATION THERAPY: Yes  04/19/23 -05/02/23:   T3/T4; s/p decompressive laminectomy on 03/31/23 he received SBRT 40 Gy in 4 fractions of 8 Gy  04/26/22 - 05/07/22:    The painful metastases in the 1st rib and T3 vertebral body were treated to 30 Gy in 10 fractions.     02/13/20 - 02/22/20:      The right iliac crest and right internal iliac node targets were treated to 50 Gy in 5 fractions of 10 Gy each   05/06/14 - 06/26/14:     Prostatic Fossa / 68.4 Gy in 38 fractions of 1.8 Gy (in combination with ST-ADT)   Past Medical History:  Diagnosis Date   Anxiety    Arthritis    Cancer (HCC)    Prostate cancer   Depression    GERD (gastroesophageal reflux disease)    H/O seasonal allergies    History of kidney stones    x1   Prostate cancer (HCC)   :   Past Surgical History:  Procedure Laterality Date   CHEST TUBE INSERTION  07/16/2019   trauma    COLONOSCOPY     HERNIA REPAIR Bilateral    20 yrs ago   LAMINECTOMY N/A 03/31/2023   Procedure: THORACIC LAMINECTOMY FOR  TUMOR;  Surgeon: Onetha Kuba, MD;  Location: [facility] OR;  Service: Neurosurgery;  Laterality: N/A;   LYMPHADENECTOMY Bilateral 09/19/2013   Procedure: LYMPHADENECTOMY  PELVIC LYMPH NODE DISSECTION;  Surgeon: Alm GORMAN Fragmin, MD;  Location: [facility] ORS;  Service: Urology;  Laterality: Bilateral;   PROSTATE BIOPSY     ROBOT ASSISTED LAPAROSCOPIC RADICAL PROSTATECTOMY N/A 09/19/2013   Procedure: ROBOTIC ASSISTED LAPAROSCOPIC RADICAL PROSTATECTOMY;  Surgeon: Alm GORMAN Fragmin, MD;  Location: [facility] ORS;  Service: Urology;  Laterality: N/A;   TONSILLECTOMY     child   VARICOCELE EXCISION    :  No current facility-administered medications for this encounter. No current outpatient medications on file.  Facility-Administered Medications Ordered in Other Encounters:    acetaminophen  (TYLENOL ) tablet 650 mg, 650 mg, Oral, Q6H PRN, 650 mg at 07/10/23 1800 **OR** acetaminophen  (TYLENOL ) suppository 650 mg, 650 mg, Rectal, Q6H PRN, Waddell Rake, MD   ALPRAZolam  (XANAX ) tablet 0.5 mg, 0.5 mg, Oral, BID PRN, Waddell Rake, MD   cyanocobalamin  (VITAMIN B12) tablet 1,000 mcg, 1,000 mcg, Oral, Daily, Waddell Rake, MD, 1,000 mcg at 07/11/23 1014   dexamethasone  (DECADRON ) tablet 4 mg, 4 mg, Oral, QID, Waddell Rake, MD, 4 mg at 07/11/23 1302   docusate sodium  (COLACE) capsule  100 mg, 100 mg, Oral, Daily, Waddell Rake, MD, 100 mg at 07/11/23 1014   escitalopram  (LEXAPRO ) tablet 30 mg, 30 mg, Oral, Daily, Waddell Rake, MD, 30 mg at 07/11/23 1014   fentaNYL  (DURAGESIC ) 50 MCG/HR 1 patch, 1 patch, Transdermal, Q72H, Waddell Rake, MD, 1 patch at 07/11/23 1013   ferric gluconate (FERRLECIT) 250 mg in sodium chloride  0.9 % 250 mL IVPB, 250 mg, Intravenous, Once, Tina Pauletta BROCKS, MD, Last Rate: 135 mL/hr at 07/11/23 1501, 250 mg at 07/11/23 1501   folic acid  (FOLVITE ) tablet 1 mg, 1 mg, Oral, Daily, Tina Pauletta BROCKS, MD, 1 mg at 07/11/23 1302   gabapentin  (NEURONTIN ) capsule 900 mg, 900 mg, Oral, BID, Waddell Rake, MD, 900 mg  at 07/11/23 1014   HYDROmorphone  (DILAUDID ) injection 0.5 mg, 0.5 mg, Intravenous, Q2H PRN, Jadine Toribio SQUIBB, MD   magnesium  oxide (MAG-OX) tablet 400 mg, 400 mg, Oral, Daily, Waddell Rake, MD, 400 mg at 07/11/23 1014   melatonin tablet 3 mg, 3 mg, Oral, QHS PRN, Waddell Rake, MD   methocarbamol  (ROBAXIN ) tablet 750 mg, 750 mg, Oral, Q8H PRN, Waddell Rake, MD, 750 mg at 07/10/23 1759   ondansetron  (ZOFRAN ) tablet 4 mg, 4 mg, Oral, Q6H PRN **OR** ondansetron  (ZOFRAN ) injection 4 mg, 4 mg, Intravenous, Q6H PRN, Waddell Rake, MD   pantoprazole  (PROTONIX ) EC tablet 40 mg, 40 mg, Oral, BID AC, Waddell Rake, MD, 40 mg at 07/11/23 0845   QUEtiapine  (SEROQUEL ) tablet 200 mg, 200 mg, Oral, QHS, Waddell Rake, MD, 200 mg at 07/10/23 2150   traZODone  (DESYREL ) tablet 100 mg, 100 mg, Oral, QHS, Waddell Rake, MD, 100 mg at 07/10/23 2150:   Allergies  Allergen Reactions   Codeine Nausea And Vomiting   Penicillin G Other (See Comments)    Stomach upset  :   Family History  Problem Relation Age of Onset   Prostate cancer Father    Breast cancer Neg Hx    Colon cancer Neg Hx    Pancreatic cancer Neg Hx   :   Social History   Socioeconomic History   Marital status: Married    Spouse name: Not on file   Number of children: Not on file   Years of education: Not on file   Highest education level: Not on file  Occupational History   Not on file  Tobacco Use   Smoking status: Never   Smokeless tobacco: Never  Vaping Use   Vaping status: Never Used  Substance and Sexual Activity   Alcohol use: Yes    Comment: 4 beers per week   Drug use: No   Sexual activity: Yes  Other Topics Concern   Not on file  Social History Narrative   Not on file   Social Drivers of Health   Financial Resource Strain: Not on file  Food Insecurity: No Food Insecurity (07/11/2023)   Hunger Vital Sign    Worried About Running Out of Food in the Last Year: Never true    Ran Out of Food in the Last  Year: Never true  Transportation Needs: No Transportation Needs (07/11/2023)   PRAPARE - Administrator, Civil Service (Medical): No    Lack of Transportation (Non-Medical): No  Physical Activity: Not on file  Stress: Not on file  Social Connections: Moderately Isolated (07/11/2023)   Social Connection and Isolation Panel [NHANES]    Frequency of Communication with Friends and Family: More than three times a week    Frequency of Social  Gatherings with Friends and Family: Once a week    Attends Religious Services: Never    Database Administrator or Organizations: No    Attends Banker Meetings: Never    Marital Status: Married  Catering Manager Violence: Not At Risk (07/11/2023)   Humiliation, Afraid, Rape, and Kick questionnaire    Fear of Current or Ex-Partner: No    Emotionally Abused: No    Physically Abused: No    Sexually Abused: No  :  REVIEW OF SYSTEMS:  A 15 point review of systems is documented in the electronic medical record. This was obtained by the nursing staff. However, I reviewed this with the patient to discuss relevant findings and make appropriate changes.  Pertinent items are noted in HPI.   PHYSICAL EXAM:  Per med-onc GENERAL:alert, no distress and comfortable SKIN: skin color normal, no rashes  EYES: normal, sclera clear OROPHARYNX: no exudate, no erythema    NECK: supple,  non-tender, without nodularity LYMPH:  no palpable cervical lymphadenopathy LUNGS: clear to auscultation with normal breathing effort.  No wheeze or rales HEART: regular rate & rhythm and no murmurs and no lower extremity edema ABDOMEN: abdomen soft, non-tender and nondistended Musculoskeletal: no edema NEURO: alert, fluent speech, no focal motor/sensory deficits.  Strength and sensation equal bilaterally. Although on my exam, he does have reduced grip strength 3-4/5 on the left hand and generalized non-focal leg weakness.  KPS = 60  100 - Normal; no complaints; no  evidence of disease. 90   - Able to carry on normal activity; minor signs or symptoms of disease. 80   - Normal activity with effort; some signs or symptoms of disease. 51   - Cares for self; unable to carry on normal activity or to do active work. 60   - Requires occasional assistance, but is able to care for most of his personal needs. 50   - Requires considerable assistance and frequent medical care. 40   - Disabled; requires special care and assistance. 30   - Severely disabled; hospital admission is indicated although death not imminent. 20   - Very sick; hospital admission necessary; active supportive treatment necessary. 10   - Moribund; fatal processes progressing rapidly. 0     - Dead  Karnofsky DA, Abelmann WH, Craver LS and Burchenal [facility] [phone]) The use of the nitrogen mustards in the palliative treatment of carcinoma: with particular reference to bronchogenic carcinoma Cancer 1 [id number]  LABORATORY DATA:  Lab Results  Component Value Date   WBC 3.2 (L) 07/11/2023   HGB 8.7 (L) 07/11/2023   HCT 28.6 (L) 07/11/2023   MCV 81.0 07/11/2023   PLT 299 07/11/2023   Lab Results  Component Value Date   NA 133 (L) 07/11/2023   K 4.1 07/11/2023   CL 102 07/11/2023   CO2 24 07/11/2023   Lab Results  Component Value Date   ALT 33 07/10/2023   AST 21 07/10/2023   ALKPHOS 76 07/10/2023   BILITOT 0.3 07/10/2023     RADIOGRAPHY: MR Cervical Spine W and Wo Contrast Result Date: 07/10/2023 CLINICAL DATA:  Metastatic disease evaluation EXAM: MRI CERVICAL AND THORACIC SPINE WITHOUT AND WITH CONTRAST TECHNIQUE: Multiplanar and multiecho pulse sequences of the cervical spine, to include the craniocervical junction and cervicothoracic junction, and the thoracic spine, were obtained without and with intravenous contrast. CONTRAST:  8mL GADAVIST  GADOBUTROL  1 MMOL/ML IV SOLN COMPARISON:  PET CT 05/04/23, MR T Spine 04/11/23 FINDINGS: MRI CERVICAL SPINE FINDINGS Alignment:  Physiologic. Vertebrae: T1  hyperintense lesions are present at the C6 and C7 vertebral bodies, worirsome for osseous metastatic disease. T1 hypointense lesions are also present at the left-sided facet at C3. There is abnormal contrast enhancement in the ventral and dorsal epidural space spanning the C5-C7 vertebral body levels (series 7, image 10) Cord: Normal signal and morphology. Posterior Fossa, vertebral arteries, paraspinal tissues: Negative. Disc levels: There is severe spinal canal stenosis of the C5-C6, C6-C7, and C7-T1 levels. MRI THORACIC SPINE FINDINGS Alignment:  Physiologic. Vertebrae: There are multilevel T1 hypointense lesions lesions in the thoracic spine, most notably at T1-T5, T8, T10-T11. kyphoplasty changes at T3. Compared to prior exam there is a new/increased T2 hyperintense fluid collection in the dorsal epidural space spanning the T3-T6 levels with associated contrast enhancement. While this could represent extraosseous tumor, epidural phlegmon is not excluded. There is also increased contrast enhancement in the ventral epidural space spanning the T2-T5 levels (series 7, image 12). Cord: There is T2 hyperintense cord signal abnormality of the T2-T3 level, which appears slightly more conspicuous than on 03/29/2023 Paraspinal and other soft tissues: Compared to prior exam there is a new peripherally enhancing 3.2 x 2.4 x 2.8 cm T2 hyperintense fluid collection of the laminectomy site (series 7, image 13). This is unlikely represent a postsurgical fluid collection as it was absent on recent prior MRI of the thoracic spine dated 04/11/2023. This is nonspecific, but infection is not excluded. There is a 1.9 cm retrocrural lymph node on the left (series 24, image 33), favored to represent nodal metastatic disease. This is new from PET CT 05/04/23. Disc levels: There is persistent severe spinal canal stenosis of the T2-T3 level (series 24, image 8). There is now moderate spinal canal narrowing of the T4-T5 levels.  IMPRESSION: 1. Redemonstrated multiple osseous metastases spanning the C5 to the L1 vertebral bodies, as above. 2. New/increased T2 hyperintense contrast-enhancing material in the dorsal epidural space spanning the C5-T6 levels. While this could represent extraosseous tumor, epidural phlegmon is not excluded. 3. Increased contrast enhancement in the ventral epidural space spanning the C5-T5 levels, which could represent progressive epidural tumor, but infection is not excluded. 4. There is now severe spinal canal stenosis C5-T3 levels. 5. New peripherally enhancing 3.2 x 2.4 x 2.8 cm fluid collection at the laminectomy site. This was not present recent prior MRI of the thoracic spine dated 04/11/2023. Although this is nonspecific and a post-surgical fluid collection is a consideration, infection is not excluded. 6. New 1.8 cm retrocrural lymph node on the left. Consider further evaluation with a repeat CT chest abdomen/pelvis to better characterize the extent of metastatic disease. Electronically Signed   By: Lyndall Gore M.D.   On: 07/10/2023 14:20   MR THORACIC SPINE W WO CONTRAST Result Date: 07/10/2023 CLINICAL DATA:  Metastatic disease evaluation EXAM: MRI CERVICAL AND THORACIC SPINE WITHOUT AND WITH CONTRAST TECHNIQUE: Multiplanar and multiecho pulse sequences of the cervical spine, to include the craniocervical junction and cervicothoracic junction, and the thoracic spine, were obtained without and with intravenous contrast. CONTRAST:  8mL GADAVIST  GADOBUTROL  1 MMOL/ML IV SOLN COMPARISON:  PET CT 05/04/23, MR T Spine 04/11/23 FINDINGS: MRI CERVICAL SPINE FINDINGS Alignment: Physiologic. Vertebrae: T1 hyperintense lesions are present at the C6 and C7 vertebral bodies, worirsome for osseous metastatic disease. T1 hypointense lesions are also present at the left-sided facet at C3. There is abnormal contrast enhancement in the ventral and dorsal epidural space spanning the C5-C7 vertebral body levels (series 7,  image  10) Cord: Normal signal and morphology. Posterior Fossa, vertebral arteries, paraspinal tissues: Negative. Disc levels: There is severe spinal canal stenosis of the C5-C6, C6-C7, and C7-T1 levels. MRI THORACIC SPINE FINDINGS Alignment:  Physiologic. Vertebrae: There are multilevel T1 hypointense lesions lesions in the thoracic spine, most notably at T1-T5, T8, T10-T11. kyphoplasty changes at T3. Compared to prior exam there is a new/increased T2 hyperintense fluid collection in the dorsal epidural space spanning the T3-T6 levels with associated contrast enhancement. While this could represent extraosseous tumor, epidural phlegmon is not excluded. There is also increased contrast enhancement in the ventral epidural space spanning the T2-T5 levels (series 7, image 12). Cord: There is T2 hyperintense cord signal abnormality of the T2-T3 level, which appears slightly more conspicuous than on 03/29/2023 Paraspinal and other soft tissues: Compared to prior exam there is a new peripherally enhancing 3.2 x 2.4 x 2.8 cm T2 hyperintense fluid collection of the laminectomy site (series 7, image 13). This is unlikely represent a postsurgical fluid collection as it was absent on recent prior MRI of the thoracic spine dated 04/11/2023. This is nonspecific, but infection is not excluded. There is a 1.9 cm retrocrural lymph node on the left (series 24, image 33), favored to represent nodal metastatic disease. This is new from PET CT 05/04/23. Disc levels: There is persistent severe spinal canal stenosis of the T2-T3 level (series 24, image 8). There is now moderate spinal canal narrowing of the T4-T5 levels. IMPRESSION: 1. Redemonstrated multiple osseous metastases spanning the C5 to the L1 vertebral bodies, as above. 2. New/increased T2 hyperintense contrast-enhancing material in the dorsal epidural space spanning the C5-T6 levels. While this could represent extraosseous tumor, epidural phlegmon is not excluded. 3. Increased  contrast enhancement in the ventral epidural space spanning the C5-T5 levels, which could represent progressive epidural tumor, but infection is not excluded. 4. There is now severe spinal canal stenosis C5-T3 levels. 5. New peripherally enhancing 3.2 x 2.4 x 2.8 cm fluid collection at the laminectomy site. This was not present recent prior MRI of the thoracic spine dated 04/11/2023. Although this is nonspecific and a post-surgical fluid collection is a consideration, infection is not excluded. 6. New 1.8 cm retrocrural lymph node on the left. Consider further evaluation with a repeat CT chest abdomen/pelvis to better characterize the extent of metastatic disease. Electronically Signed   By: Lyndall Gore M.D.   On: 07/10/2023 14:20   NM PLUVICTO  ADMINISTRATION Result Date: 07/05/2023 CLINICAL DATA:  Castrate resistant prostate adenocarcinoma. Trial of taxing chemotherapy with adverse effects. PSMA avid skeletal metastasis identified on is made PET scan 05/05/2023. EXAM: NUCLEAR MEDICINE PLUVICTO  INJECTION TECHNIQUE: Infusion: The nuclear medicine technologist and I personally verified the dose activity to be delivered as specified in the written directive, and verified the patient identification via 2 separate methods. Initial flush of the intravenous catheter was performed was sterile saline. The dose syringe was connected to the catheter and the Lu-177 Pluvicto  administered over a 1 to 10 min infusion. Single 10 cc lushes with normal saline follow the dose. No complications were noted. The entire IV tubing, venocatheter, stopcock and syringes was removed in total, placed in a disposal bag and sent for assay of the residual activity, which will be reported at a later time in our EMR by the physics staff. Pressure was applied to the venipuncture site, and a compression bandage placed. Patient monitored for 1 hour following infusion. Radiation Safety personnel were present to perform the discharge survey, as  detailed  on their documentation. After a short period of observation, the patient had his IV removed. RADIOPHARMACEUTICALS:  Two hundred three microcuries Lu-177 PLUVICTO  FINDINGS: Current Infusion: 1 Planned Infusions: 6 Patient presented to nuclear medicine for treatment. The patient's most recent blood counts were reviewed and remains a good candidate to proceed with Lu-177 Pluvicto . Patient chronically anemic with hemoglobin equal 8.3. White blood cell count improved to 3.21.4. Normal renal function liver function. PSA equal 34.9 The patient was situated in an infusion suite with a contact barrier placed under the arm. Intravenous access was established, using sterile technique, and a normal saline infusion from a syringe was started. Micro-dosimetry: The prescribed radiation activity was assayed and confirmed to be within specified tolerance. IMPRESSION: Current Infusion: 1 Planned Infusions: 6 The patient tolerated the infusion well. The patient will return in 6 weeks for ongoing care. Electronically Signed   By: Jackquline Boxer M.D.   On: 07/05/2023 14:48   NM Radiologist Eval And Mgmt Result Date: 06/14/2023 EXAM: NEW PATIENT OFFICE VISIT CHIEF COMPLAINT: 68 year old male with castrate resistant metastatic prostate carcinoma. Patient present 2015 with Gleason 4+3=7 prostate adenocarcinoma PSA equal 9.1. Subsequent robotic assisted laparoscopic radical prostatectomy bilateral lymph node dissection. Salvage radiation therapy December 2015. Six months and deprivation. Progression of disease March 2022 on PSMA PET scan. Trial taxane chemotherapy with significant toxicities. Patient currently on Lupron  and Zometa . Evaluation for radiotherapy with Pluvicto   (Lu 177 PSMA) . Patient has extensive metastatic bone disease on PSMA PET scan 05/05/2023. HISTORY OF PRESENT ILLNESS: See epic note REVIEW OF SYSTEMS: See epic note PHYSICAL EXAMINATION: See epic note ASSESSMENT AND PLAN: 1. Patient is adequate patient  for a Lu cm 177 PSMA peptide receptor radiotherapy. 2. 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 Trial of taxane therapy with significant toxicities. 3. Patient does have significant depressed marrow with anemia and leukopenia related to chemotherapy. Hgb = 8.8. WBC = 1.4K. This will be monitored closely. Normal renal function and hepatic function. 4. Benefits and risks of therapy discussed with patient. Primary benefit being progression-free survival. Primary risk marrow toxicity. Additional toxicities of renal toxicity and xerostomia were discussed 5. Patient will undergo 6 treatments of the Lu 177 PSMA therapy space 6 weeks apart over six-month interval. Patient to return to oncologist 7 to 10 days prior to subsequent therapy for CBC, CMP and PSA. Electronically Signed   By: Jackquline Boxer M.D.   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  We presented his case in our neuro-oncology conference and neurosurgery did not see a role for surgery at this time, leaving re-irradiation as the only viable local therapy for palliation, in the face of two previous courses of radiotherapy at this level.  Further radiation will carry a heightened risk for spinal cord injury 9-12 months after radiation.  PLAN:Today, I talked to the patient and family about the findings and work-up thus far.  We discussed the natural history of recurrent spinal cord compression from tumor and general treatment, highlighting the role of radiotherapy in the management.  We discussed the available radiation techniques, and focused on the details of logistics and delivery.  We reviewed the anticipated acute and late sequelae associated with radiation in this setting.  The patient was encouraged to ask questions that I answered to  the best of my ability.  I explained that without treatment, he would likely progress to paraplegia in coming weeks, but, with re-irradiation we could likely preserve spinal cord function in coming weeks and months, with the potential risk of spinal cord injury from radiation in 9 to 12 months.  The patient would like to proceed with radiation and will be scheduled for CT simulation and treatment today  I personally spent 60 minutes in this encounter including chart review, reviewing radiological studies, meeting face-to-face with the patient, entering orders and completing documentation.    ------------------------------------------------   Donnice Barge, MD [facility]  Radiation Oncology Direct Dial: [phone]  Fax: [phone] [email or web address]  Skype  LinkedIn

## 2023-07-11 NOTE — Progress Notes (Signed)
  Progress Note   Patient: Nathan Berg FMW:[id number] DOB: May 06, 1956 DOA: 07/10/2023     1 DOS: the patient was seen and examined on 07/11/2023   Brief hospital course: 68 year old man PMH including stage IV prostate cancer presented with left upper extremity weakness and worsening back pain.  Previously in the hospital in September of last year and underwent decompressive laminectomy for metastatic disease involving the thoracic spine with cord compression at that time.  Following with oncology.  Developed left hand weakness and inability to grip things as well as some numbness.  Imaging revealed metastatic disease spanning from C5-L1 vertebral bodies.  EDP discussed with Dr. Colon who who recommended nonoperative management.  Patient will begin radiation therapy.  Consultants Oncology Neurosurgery Radiation oncology   Procedures/Events 1/5 admission for left hand weakness.   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 Discussed with his oncologist Dr. Tina, plan is for radiation therapy.  Continue steroids.  Palliative care consulted for assistance with symptom management.  Normocytic anemia, likely secondary to malignancy/chronic disease. B12 within normal limits.  Follow clinically.  Appears stable currently.  Cancer related pain Palliative care consulted.  Continue present management.  Chemotherapy-induced Neuropathy Continue gabapentin  900mg  BID   Anxiety and depression Continue lexapro  and xanax  PRN   GERD (gastroesophageal reflux disease) Continue PPI BID  Low serum vitamin B12 B12 within normal limits.  Continue oral replacement     Subjective:  Main issue is left hand weakness as well as change in balance and lower extremities.  Physical Exam: Vitals:    07/11/23 1137 07/11/23 1310 07/11/23 1522 07/11/23 1627  BP: 106/67 124/75 110/67 114/75  Pulse: 78 79 79 85  Resp: 14 15 16 14   Temp: 97.8 F (36.6 C) 97.7 F (36.5 C) 97.9 F (36.6 C) 97.7 F (36.5 C)  TempSrc: Oral Oral Oral Oral  SpO2: 95% 96% 94% 94%  Weight:      Height:       Physical Exam Vitals reviewed.  Constitutional:      General: He is not in acute distress.    Appearance: He is not ill-appearing or toxic-appearing.  Cardiovascular:     Rate and Rhythm: Normal rate and regular rhythm.     Heart sounds: No murmur heard. Pulmonary:     Effort: Pulmonary effort is normal. No respiratory distress.     Breath sounds: No wheezing, rhonchi or rales.  Neurological:     Mental Status: He is alert.  Psychiatric:        Mood and Affect: Mood normal.        Behavior: Behavior normal.     Data Reviewed: BMP noted Hgb stable 8.7  Family Communication: none present  Disposition: Status is: Inpatient Remains inpatient appropriate because: XRT      Time spent: 35 minutes  Author: Toribio Door, MD 07/11/2023 5:28 PM  For on call review [email or web address].

## 2023-07-11 NOTE — Consult Note (Signed)
 [facility] ADMISSION NOTE  Patient Care Team: Nathan Callander, MD as PCP - General (Hematology) Nathan Callander, MD as Attending Physician (Hematology and Oncology) Nathan Prentice DEL, MD (Family Medicine)   ASSESSMENT & PLAN:  Nathan Berg 68 y.o. 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  Imaging findings showed tumor encasing from multiple levels of C5-T6.  T3-T4 has been radiated in the past.  We discussed the findings today.  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  He likely has start of progression prior to start Pluvicto .  It will generally take a few months to see interval response.  However at this time, he has more acuminate of tumor control and palliation for pain.  We discussed options are to continue Pluvicto , while receiving palliative radiation.  Informed him of the risks of repeating radiation to the same side medical spinal cord injury, potential paralysis in the future.  Alternatively, palliative care approach with hospice to focus on comfort measure.  He is more comfortable in proceeding with palliation of treatment with radiation, understanding the risks of paralysis in the long-term and spinal cord injury.  He preferred to continue Pluvicto  at this time and he is not ready for hospice. Code Status  We also discussed CODE STATUS.  He is having DNR in place.  We agree with this CODE STATUS.  We discussed that if he were to continue to deteriorate, tumor progression, uncontrolled toxicity from malignancy, consider hospice at that time is reasonable.  mCRPC Progressed on several lines of treatment Will continue with Pluvicto  as outpatient monthly Appreciate radiation oncology consult for palliative radiation  Cancer related pain Currently on dexamethasone  4 mg.  Will decrease to twice daily.  Will wean as able  after starting radiation. Fentanyl  patch 50 mcg Hydromorphone  as needed every 2 hours Palliative radiation  Dehydration a small bolus available to be given today. Will increase oral intake  Folate deficiency Folic acid  1 mg daily  Iron deficiency May give 1 dose of iv iron  Goals of care Discussed today  Discharge planning Pending pain control.  Appreciate primary team management of pain the other medical management.  All questions were answered.    Nathan JAYSON Chihuahua, MD 07/11/2023 11:51 AM   CHIEF COMPLAINTS/PURPOSE OF ADMISSION mCRPC  HISTORY OF PRESENTING ILLNESS:  Nathan Berg 68 y.o. 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  Report his left hand started to get weak with slight numbness.  Report shoulder blade area around T4 has been painful.  In the ED vital sign was stable.  MRI of the cervical and thoracic spine was done and reported new and increased T2 hyperintense contrast-enhancing material in the dorsal epidural space spanning the C5-T6 level.  While this could represent extraosseous tumor, epidural phlegmon is not excluded. Increased contrast enhancement in the ventral epidural space spanning the C5-T5 levels, which could represent progressive epidural tumor, but infection is not excluded. There is now severe spinal canal stenosis C5-T3 levels.  New peripherally enhancing 3.2 x 2.4 x 2.8 cm fluid collection at the laminectomy site.   Summary of oncologic history as follows: Oncology History Overview Note   Cancer Staging  Malignant neoplasm of prostate [facility]) Staging  form: Prostate, AJCC 7th Edition - Clinical: Stage IV (TX, NX, M1, PSA: Less than 10, Gleason 8-10) - Signed by Nathan Windell SAILOR, MD on 04/30/2021     Prostate cancer (HCC)  09/19/2013 Initial Diagnosis   Malignant neoplasm of prostate (HCC)    04/30/2021 Cancer Staging   Staging form: Prostate, AJCC 7th Edition - Clinical: Stage IV (TX, NX, M1, PSA: Less than 10, Gleason 8-10) - Signed by Nathan Windell SAILOR, MD on 04/30/2021   03/11/2022 Imaging    IMPRESSION: 1. Multifocal intense radiotracer avid skeletal metastasis again noted involving the axillary skeleton. No change in pattern or number of lesions. No new lesions identified. 2. The radiotracer activity of the lesions; however, is uniformly increased from comparison exam. 3. The sclerotic pattern of the lesions on comparison CT is a similar. One lesion at T3 as new lytic component. 4. No visceral metastasis.  No new nodal disease.     12/09/2022 PET scan    IMPRESSION: 1. 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 2. Metastatic skeletal lesion in the RIGHT iliac wing is increased in size compared to prior. 3. Other than the 2 above lesions, the pattern and intensity of multifocal radiotracer avid skeletal metastasis is unchanged. No new lesions identified. 4. No evidence of nodal metastasis or visceral metastasis.     03/09/2023 Tumor Marker   PSA 25   04/19/2023 - 05/02/2023 Radiation Therapy   SBRT T spine 40 Gy/5 fx   05/04/2023 PET scan   PSMA PET 1. Interval increase in size and degree of tracer uptake associated with multifocal bone metastases. 2. New tracer avid nodule within the medial right upper lobe is compatible with pulmonary metastasis. 3. Additional small scattered lung nodules are identified which are too small to characterize by PET-CT but appear new from the previous exam. 4.  Aortic Atherosclerosis (ICD10-I70.0).   05/04/2023 Tumor Marker   PSA 44.3   05/18/2023 - 05/18/2023 Chemotherapy   Patient is on Treatment Plan : PROSTATE Docetaxel  (50) + Prednisone  q14d      Genetic Testing   [facility] CancerNext-Expanded Panel+RNA was Negative. Report date is 05/26/2023.   The  CancerNext-Expanded gene panel offered by [facility] and includes sequencing, rearrangement, and RNA analysis for the following 71 genes: AIP, ALK, APC, ATM, AXIN2, BAP1, BARD1, BMPR1A, BRCA1, BRCA2, BRIP1, CDC73, CDH1, CDK4, CDKN1B, CDKN2A, CHEK2, CTNNA1, DICER1, FH, FLCN, KIF1B, LZTR1, MAX, MEN1, MET, MLH1, MSH2, MSH3, MSH6, MUTYH, NF1, NF2, NTHL1, PALB2, PHOX2B, PMS2, POT1, PRKAR1A, PTCH1, PTEN, RAD51C, RAD51D, RB1, RET, SDHA, SDHAF2, SDHB, SDHC, SDHD, SMAD4, SMARCA4, SMARCB1, SMARCE1, STK11, SUFU, TMEM127, TP53, TSC1, TSC2, and VHL (sequencing and deletion/duplication); EGFR, EGLN1, HOXB13, KIT, MITF, PDGFRA, POLD1, and POLE (sequencing only); EPCAM and GREM1 (deletion/duplication only).       MEDICAL HISTORY:  Past Medical History:  Diagnosis Date   Anxiety    Arthritis    Cancer (HCC)    Prostate cancer   Depression    GERD (gastroesophageal reflux disease)    H/O seasonal allergies    History of kidney stones    x1   Prostate cancer (HCC)     SURGICAL HISTORY: Past Surgical History:  Procedure Laterality Date   CHEST TUBE INSERTION  07/16/2019   trauma    COLONOSCOPY     HERNIA REPAIR Bilateral    20 yrs ago   LAMINECTOMY N/A 03/31/2023   Procedure: THORACIC LAMINECTOMY FOR TUMOR;  Surgeon: Onetha Kuba, MD;  Location: [facility] OR;  Service: Neurosurgery;  Laterality: N/A;   LYMPHADENECTOMY Bilateral 09/19/2013   Procedure: LYMPHADENECTOMY  PELVIC LYMPH NODE DISSECTION;  Surgeon: Alm GORMAN Fragmin, MD;  Location: [facility] ORS;  Service: Urology;  Laterality: Bilateral;   PROSTATE BIOPSY     ROBOT ASSISTED LAPAROSCOPIC RADICAL PROSTATECTOMY N/A 09/19/2013   Procedure: ROBOTIC ASSISTED LAPAROSCOPIC RADICAL PROSTATECTOMY;  Surgeon: Alm GORMAN Fragmin, MD;  Location: [facility] ORS;  Service: Urology;  Laterality: N/A;   TONSILLECTOMY     child   VARICOCELE EXCISION      SOCIAL HISTORY: Social History   Socioeconomic History   Marital status: Married    Spouse name: Not on file   Number of  children: Not on file   Years of education: Not on file   Highest education level: Not on file  Occupational History   Not on file  Tobacco Use   Smoking status: Never   Smokeless tobacco: Never  Vaping Use   Vaping status: Never Used  Substance and Sexual Activity   Alcohol use: Yes    Comment: 4 beers per week   Drug use: No   Sexual activity: Yes  Other Topics Concern   Not on file  Social History Narrative   Not on file   Social Drivers of Health   Financial Resource Strain: Not on file  Food Insecurity: No Food Insecurity (07/11/2023)   Hunger Vital Sign    Worried About Running Out of Food in the Last Year: Never true    Ran Out of Food in the Last Year: Never true  Transportation Needs: No Transportation Needs (07/11/2023)   PRAPARE - Administrator, Civil Service (Medical): No    Lack of Transportation (Non-Medical): No  Physical Activity: Not on file  Stress: Not on file  Social Connections: Moderately Isolated (07/11/2023)   Social Connection and Isolation Panel [NHANES]    Frequency of Communication with Friends and Family: More than three times a week    Frequency of Social Gatherings with Friends and Family: Once a week    Attends Religious Services: Never    Database Administrator or Organizations: No    Attends Banker Meetings: Never    Marital Status: Married  Catering Manager Violence: Not At Risk (07/11/2023)   Humiliation, Afraid, Rape, and Kick questionnaire    Fear of Current or Ex-Partner: No    Emotionally Abused: No    Physically Abused: No    Sexually Abused: No    FAMILY HISTORY: Family History  Problem Relation Age of Onset   Prostate cancer Father    Breast cancer Neg Hx    Colon cancer Neg Hx    Pancreatic cancer Neg Hx     ALLERGIES:  is allergic to codeine and penicillin g.  MEDICATIONS:  Current Facility-Administered Medications  Medication Dose Route Frequency Provider Last Rate Last Admin   acetaminophen   (TYLENOL ) tablet 650 mg  650 mg Oral Q6H PRN Waddell Rake, MD   650 mg at 07/10/23 1800   Or   acetaminophen  (TYLENOL ) suppository 650 mg  650 mg Rectal Q6H PRN Waddell Rake, MD       ALPRAZolam  (XANAX ) tablet 0.5 mg  0.5 mg Oral BID PRN Waddell Rake, MD       cyanocobalamin  (VITAMIN B12) tablet 1,000 mcg  1,000 mcg Oral Daily Waddell Rake, MD   1,000 mcg at 07/11/23 1014   dexamethasone  (DECADRON ) tablet 4 mg  4 mg Oral QID Waddell,  Isaiah, MD   4 mg at 07/11/23 1014   docusate sodium  (COLACE) capsule 100 mg  100 mg Oral Daily Waddell Isaiah, MD   100 mg at 07/11/23 1014   escitalopram  (LEXAPRO ) tablet 30 mg  30 mg Oral Daily Waddell Isaiah, MD   30 mg at 07/11/23 1014   fentaNYL  (DURAGESIC ) 50 MCG/HR 1 patch  1 patch Transdermal Q72H Waddell Isaiah, MD   1 patch at 07/11/23 1013   gabapentin  (NEURONTIN ) capsule 900 mg  900 mg Oral BID Waddell Isaiah, MD   900 mg at 07/11/23 1014   HYDROmorphone  (DILAUDID ) injection 0.5-1 mg  0.5-1 mg Intravenous Q2H PRN Waddell Isaiah, MD   1 mg at 07/11/23 1004   magnesium  oxide (MAG-OX) tablet 400 mg  400 mg Oral Daily Waddell Isaiah, MD   400 mg at 07/11/23 1014   melatonin tablet 3 mg  3 mg Oral QHS PRN Waddell Isaiah, MD       methocarbamol  (ROBAXIN ) tablet 750 mg  750 mg Oral Q8H PRN Waddell Isaiah, MD   750 mg at 07/10/23 1759   ondansetron  (ZOFRAN ) tablet 4 mg  4 mg Oral Q6H PRN Waddell Isaiah, MD       Or   ondansetron  (ZOFRAN ) injection 4 mg  4 mg Intravenous Q6H PRN Waddell Isaiah, MD       pantoprazole  (PROTONIX ) EC tablet 40 mg  40 mg Oral BID AC Waddell Isaiah, MD   40 mg at 07/11/23 0845   QUEtiapine  (SEROQUEL ) tablet 200 mg  200 mg Oral QHS Waddell Isaiah, MD   200 mg at 07/10/23 2150   traZODone  (DESYREL ) tablet 100 mg  100 mg Oral QHS Waddell Isaiah, MD   100 mg at 07/10/23 2150    REVIEW OF SYSTEMS:   Constitutional: Denies fevers, weight loss or abnormal night sweats Eyes: Denies visual change Ears, nose, mouth, throat, and face:  Denies sore throat or enlarged tongue Respiratory: Denies cough, shortness of breath or wheezes Cardiovascular: Denies palpitation, chest discomfort or chest pain Gastrointestinal:  Denies nausea, vomiting, diarrhea, constipation, heartburn or abdominal pain GU: Denies any hesitancy, dysuria, frequency, hematuria Skin: Denies abnormal skin rashes Lymphatics: Denies new lymphadenopathy or mass Neurological: Denies numbness, tingling or new weaknesses All other systems were reviewed with the patient and are negative.  PHYSICAL EXAMINATION: ECOG PERFORMANCE STATUS: 2 - Symptomatic, <50% confined to bed  Vitals:   07/11/23 1035 07/11/23 1137  BP: 92/67 106/67  Pulse: 75 78  Resp:  14  Temp: 97.8 F (36.6 C) 97.8 F (36.6 C)  SpO2: 98% 95%   Filed Weights   07/10/23 1051  Weight: 186 lb 4.6 oz (84.5 kg)    GENERAL:alert, no distress and comfortable SKIN: skin color normal. No jaundice EYES: normal, sclera clear OROPHARYNX: no exudate, dry NECK: supple. No mass LYMPH:  no palpable cervical, axillary or inguinal lymphadenopathy LUNGS: clear to auscultation and normal breathing effort.  No wheeze or rales HEART: regular rate & rhythm and no murmurs ABDOMEN:abdomen soft, non-tender and normal bowel sounds Musculoskeletal: Mild lower extremity edema  NEURO: alert with fluent speech;  Left lower extremity slightly weaker than the right.  Left upper extremity 4/5  LABORATORY DATA:  I have reviewed the data as listed Lab Results  Component Value Date   WBC 3.2 (L) 07/11/2023   HGB 8.7 (L) 07/11/2023   HCT 28.6 (L) 07/11/2023   MCV 81.0 07/11/2023   PLT 299 07/11/2023   Recent Labs    04/17/23 1124  04/18/23 0534 05/18/23 1131 05/31/23 1022 07/05/23 0948 07/10/23 1212 07/11/23 0605  NA  --    < > 142 140 136 134* 133*  K  --    < > 3.5 3.5 3.5 4.2 4.1  CL  --    < > 108 106 103 100 102  CO2  --    < > 29 30 25 25 24   GLUCOSE  --    < > 81 86 93 102* 117*  BUN  --     < > 17 15 14 12 11   CREATININE  --    < > 0.60* 0.77 0.74 0.60* 0.49*  CALCIUM   --    < > 8.3* 8.3* 8.2* 8.1* 8.1*  GFRNONAA  --    < > >60 >60 >60 >60 >60  PROT 6.0*   < > 5.8* 5.9*  --  5.7*  --   ALBUMIN  2.8*   < > 2.9* 2.9*  --  2.7*  --   AST 17   < > 13* 12*  --  21  --   ALT 20   < > 15 10  --  33  --   ALKPHOS 77   < > 78 94  --  76  --   BILITOT 0.6   < > 0.4 0.4  --  0.3  --   BILIDIR <0.1  --   --   --   --   --   --   IBILI NOT CALCULATED  --   --   --   --   --   --    < > = values in this interval not displayed.    RADIOGRAPHIC STUDIES: I have personally reviewed the radiological images as listed and agreed with the findings in the report. MR Cervical Spine W and Wo Contrast Result Date: 07/10/2023 CLINICAL DATA:  Metastatic disease evaluation EXAM: MRI CERVICAL AND THORACIC SPINE WITHOUT AND WITH CONTRAST TECHNIQUE: Multiplanar and multiecho pulse sequences of the cervical spine, to include the craniocervical junction and cervicothoracic junction, and the thoracic spine, were obtained without and with intravenous contrast. CONTRAST:  8mL GADAVIST  GADOBUTROL  1 MMOL/ML IV SOLN COMPARISON:  PET CT 05/04/23, MR T Spine 04/11/23 FINDINGS: MRI CERVICAL SPINE FINDINGS Alignment: Physiologic. Vertebrae: T1 hyperintense lesions are present at the C6 and C7 vertebral bodies, worirsome for osseous metastatic disease. T1 hypointense lesions are also present at the left-sided facet at C3. There is abnormal contrast enhancement in the ventral and dorsal epidural space spanning the C5-C7 vertebral body levels (series 7, image 10) Cord: Normal signal and morphology. Posterior Fossa, vertebral arteries, paraspinal tissues: Negative. Disc levels: There is severe spinal canal stenosis of the C5-C6, C6-C7, and C7-T1 levels. MRI THORACIC SPINE FINDINGS Alignment:  Physiologic. Vertebrae: There are multilevel T1 hypointense lesions lesions in the thoracic spine, most notably at T1-T5, T8, T10-T11.  kyphoplasty changes at T3. Compared to prior exam there is a new/increased T2 hyperintense fluid collection in the dorsal epidural space spanning the T3-T6 levels with associated contrast enhancement. While this could represent extraosseous tumor, epidural phlegmon is not excluded. There is also increased contrast enhancement in the ventral epidural space spanning the T2-T5 levels (series 7, image 12). Cord: There is T2 hyperintense cord signal abnormality of the T2-T3 level, which appears slightly more conspicuous than on 03/29/2023 Paraspinal and other soft tissues: Compared to prior exam there is a new peripherally enhancing 3.2 x 2.4 x 2.8 cm T2 hyperintense fluid collection  of the laminectomy site (series 7, image 13). This is unlikely represent a postsurgical fluid collection as it was absent on recent prior MRI of the thoracic spine dated 04/11/2023. This is nonspecific, but infection is not excluded. There is a 1.9 cm retrocrural lymph node on the left (series 24, image 33), favored to represent nodal metastatic disease. This is new from PET CT 05/04/23. Disc levels: There is persistent severe spinal canal stenosis of the T2-T3 level (series 24, image 8). There is now moderate spinal canal narrowing of the T4-T5 levels. IMPRESSION: 1. Redemonstrated multiple osseous metastases spanning the C5 to the L1 vertebral bodies, as above. 2. New/increased T2 hyperintense contrast-enhancing material in the dorsal epidural space spanning the C5-T6 levels. While this could represent extraosseous tumor, epidural phlegmon is not excluded. 3. Increased contrast enhancement in the ventral epidural space spanning the C5-T5 levels, which could represent progressive epidural tumor, but infection is not excluded. 4. There is now severe spinal canal stenosis C5-T3 levels. 5. New peripherally enhancing 3.2 x 2.4 x 2.8 cm fluid collection at the laminectomy site. This was not present recent prior MRI of the thoracic spine dated  04/11/2023. Although this is nonspecific and a post-surgical fluid collection is a consideration, infection is not excluded. 6. New 1.8 cm retrocrural lymph node on the left. Consider further evaluation with a repeat CT chest abdomen/pelvis to better characterize the extent of metastatic disease. Electronically Signed   By: Lyndall Gore M.D.   On: 07/10/2023 14:20   MR THORACIC SPINE W WO CONTRAST Result Date: 07/10/2023 CLINICAL DATA:  Metastatic disease evaluation EXAM: MRI CERVICAL AND THORACIC SPINE WITHOUT AND WITH CONTRAST TECHNIQUE: Multiplanar and multiecho pulse sequences of the cervical spine, to include the craniocervical junction and cervicothoracic junction, and the thoracic spine, were obtained without and with intravenous contrast. CONTRAST:  8mL GADAVIST  GADOBUTROL  1 MMOL/ML IV SOLN COMPARISON:  PET CT 05/04/23, MR T Spine 04/11/23 FINDINGS: MRI CERVICAL SPINE FINDINGS Alignment: Physiologic. Vertebrae: T1 hyperintense lesions are present at the C6 and C7 vertebral bodies, worirsome for osseous metastatic disease. T1 hypointense lesions are also present at the left-sided facet at C3. There is abnormal contrast enhancement in the ventral and dorsal epidural space spanning the C5-C7 vertebral body levels (series 7, image 10) Cord: Normal signal and morphology. Posterior Fossa, vertebral arteries, paraspinal tissues: Negative. Disc levels: There is severe spinal canal stenosis of the C5-C6, C6-C7, and C7-T1 levels. MRI THORACIC SPINE FINDINGS Alignment:  Physiologic. Vertebrae: There are multilevel T1 hypointense lesions lesions in the thoracic spine, most notably at T1-T5, T8, T10-T11. kyphoplasty changes at T3. Compared to prior exam there is a new/increased T2 hyperintense fluid collection in the dorsal epidural space spanning the T3-T6 levels with associated contrast enhancement. While this could represent extraosseous tumor, epidural phlegmon is not excluded. There is also increased contrast  enhancement in the ventral epidural space spanning the T2-T5 levels (series 7, image 12). Cord: There is T2 hyperintense cord signal abnormality of the T2-T3 level, which appears slightly more conspicuous than on 03/29/2023 Paraspinal and other soft tissues: Compared to prior exam there is a new peripherally enhancing 3.2 x 2.4 x 2.8 cm T2 hyperintense fluid collection of the laminectomy site (series 7, image 13). This is unlikely represent a postsurgical fluid collection as it was absent on recent prior MRI of the thoracic spine dated 04/11/2023. This is nonspecific, but infection is not excluded. There is a 1.9 cm retrocrural lymph node on the left (series 24, image 33),  favored to represent nodal metastatic disease. This is new from PET CT 05/04/23. Disc levels: There is persistent severe spinal canal stenosis of the T2-T3 level (series 24, image 8). There is now moderate spinal canal narrowing of the T4-T5 levels. IMPRESSION: 1. Redemonstrated multiple osseous metastases spanning the C5 to the L1 vertebral bodies, as above. 2. New/increased T2 hyperintense contrast-enhancing material in the dorsal epidural space spanning the C5-T6 levels. While this could represent extraosseous tumor, epidural phlegmon is not excluded. 3. Increased contrast enhancement in the ventral epidural space spanning the C5-T5 levels, which could represent progressive epidural tumor, but infection is not excluded. 4. There is now severe spinal canal stenosis C5-T3 levels. 5. New peripherally enhancing 3.2 x 2.4 x 2.8 cm fluid collection at the laminectomy site. This was not present recent prior MRI of the thoracic spine dated 04/11/2023. Although this is nonspecific and a post-surgical fluid collection is a consideration, infection is not excluded. 6. New 1.8 cm retrocrural lymph node on the left. Consider further evaluation with a repeat CT chest abdomen/pelvis to better characterize the extent of metastatic disease. Electronically  Signed   By: Lyndall Gore M.D.   On: 07/10/2023 14:20   NM PLUVICTO  ADMINISTRATION Result Date: 07/05/2023 CLINICAL DATA:  Castrate resistant prostate adenocarcinoma. Trial of taxing chemotherapy with adverse effects. PSMA avid skeletal metastasis identified on is made PET scan 05/05/2023. EXAM: NUCLEAR MEDICINE PLUVICTO  INJECTION TECHNIQUE: Infusion: The nuclear medicine technologist and I personally verified the dose activity to be delivered as specified in the written directive, and verified the patient identification via 2 separate methods. Initial flush of the intravenous catheter was performed was sterile saline. The dose syringe was connected to the catheter and the Lu-177 Pluvicto  administered over a 1 to 10 min infusion. Single 10 cc lushes with normal saline follow the dose. No complications were noted. The entire IV tubing, venocatheter, stopcock and syringes was removed in total, placed in a disposal bag and sent for assay of the residual activity, which will be reported at a later time in our EMR by the physics staff. Pressure was applied to the venipuncture site, and a compression bandage placed. Patient monitored for 1 hour following infusion. Radiation Safety personnel were present to perform the discharge survey, as detailed on their documentation. After a short period of observation, the patient had his IV removed. RADIOPHARMACEUTICALS:  Two hundred three microcuries Lu-177 PLUVICTO  FINDINGS: Current Infusion: 1 Planned Infusions: 6 Patient presented to nuclear medicine for treatment. The patient's most recent blood counts were reviewed and remains a good candidate to proceed with Lu-177 Pluvicto . Patient chronically anemic with hemoglobin equal 8.3. White blood cell count improved to 3.21.4. Normal renal function liver function. PSA equal 34.9 The patient was situated in an infusion suite with a contact barrier placed under the arm. Intravenous access was established, using sterile technique,  and a normal saline infusion from a syringe was started. Micro-dosimetry: The prescribed radiation activity was assayed and confirmed to be within specified tolerance. IMPRESSION: Current Infusion: 1 Planned Infusions: 6 The patient tolerated the infusion well. The patient will return in 6 weeks for ongoing care. Electronically Signed   By: Jackquline Boxer M.D.   On: 07/05/2023 14:48   NM Radiologist Eval And Mgmt Result Date: 06/14/2023 EXAM: NEW PATIENT OFFICE VISIT CHIEF COMPLAINT: 68 year old male with castrate resistant metastatic prostate carcinoma. Patient present 2015 with Gleason 4+3=7 prostate adenocarcinoma PSA equal 9.1. Subsequent robotic assisted laparoscopic radical prostatectomy bilateral lymph node dissection. Salvage radiation therapy  December 2015. Six months and deprivation. Progression of disease March 2022 on PSMA PET scan. Trial taxane chemotherapy with significant toxicities. Patient currently on Lupron  and Zometa . Evaluation for radiotherapy with Pluvicto   (Lu 177 PSMA) . Patient has extensive metastatic bone disease on PSMA PET scan 05/05/2023. HISTORY OF PRESENT ILLNESS: See epic note REVIEW OF SYSTEMS: See epic note PHYSICAL EXAMINATION: See epic note ASSESSMENT AND PLAN: 1. Patient is adequate patient for a Lu cm 177 PSMA peptide receptor radiotherapy. 2. 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 Trial of taxane therapy with significant toxicities. 3. Patient does have significant depressed marrow with anemia and leukopenia related to chemotherapy. Hgb = 8.8. WBC = 1.4K. This will be monitored closely. Normal renal function and hepatic function. 4. Benefits and risks of therapy discussed with patient. Primary benefit being progression-free survival. Primary risk marrow toxicity. Additional toxicities of renal toxicity and xerostomia were discussed 5. Patient will undergo 6 treatments of the Lu 177 PSMA therapy space 6 weeks  apart over six-month interval. Patient to return to oncologist 7 to 10 days prior to subsequent therapy for CBC, CMP and PSA. Electronically Signed   By: Jackquline Boxer M.D.   On: 06/14/2023 15:58

## 2023-07-11 NOTE — Plan of Care (Signed)
  Problem: Health Behavior/Discharge Planning: Goal: Ability to manage health-related needs will improve Outcome: Progressing   Problem: Clinical Measurements: Goal: Ability to maintain clinical measurements within normal limits will improve Outcome: Progressing Goal: Will remain free from infection Outcome: Progressing Goal: Diagnostic test results will improve Outcome: Progressing   Problem: Activity: Goal: Risk for activity intolerance will decrease Outcome: Progressing   Problem: Pain Management: Goal: General experience of comfort will improve Outcome: Progressing   Problem: Safety: Goal: Ability to remain free from injury will improve Outcome: Progressing   Problem: Education: Goal: Knowledge of General Education information will improve Description: Including pain rating scale, medication(s)/side effects and non-pharmacologic comfort measures Outcome: Adequate for Discharge   Problem: Clinical Measurements: Goal: Respiratory complications will improve Outcome: Adequate for Discharge Goal: Cardiovascular complication will be avoided Outcome: Adequate for Discharge   Problem: Nutrition: Goal: Adequate nutrition will be maintained Outcome: Adequate for Discharge   Problem: Coping: Goal: Level of anxiety will decrease Outcome: Adequate for Discharge   Problem: Elimination: Goal: Will not experience complications related to bowel motility Outcome: Adequate for Discharge Goal: Will not experience complications related to urinary retention Outcome: Adequate for Discharge   Problem: Skin Integrity: Goal: Risk for impaired skin integrity will decrease Outcome: Adequate for Discharge

## 2023-07-12 ENCOUNTER — Ambulatory Visit
Admission: RE | Admit: 2023-07-12 | Discharge: 2023-07-12 | Disposition: A | Discharge status: Home / Self Care | Payer: Medicare Other | Source: Ambulatory Visit | Attending: Radiation Oncology | Admitting: Radiation Oncology

## 2023-07-12 ENCOUNTER — Other Ambulatory Visit: Payer: Self-pay

## 2023-07-12 DIAGNOSIS — C61 Malignant neoplasm of prostate: Secondary | ICD-10-CM | POA: Diagnosis not present

## 2023-07-12 DIAGNOSIS — G893 Neoplasm related pain (acute) (chronic): Secondary | ICD-10-CM | POA: Diagnosis not present

## 2023-07-12 DIAGNOSIS — C7951 Secondary malignant neoplasm of bone: Secondary | ICD-10-CM | POA: Diagnosis not present

## 2023-07-12 DIAGNOSIS — G9520 Unspecified cord compression: Secondary | ICD-10-CM | POA: Diagnosis not present

## 2023-07-12 DIAGNOSIS — Z7189 Other specified counseling: Secondary | ICD-10-CM | POA: Diagnosis not present

## 2023-07-12 LAB — RAD ONC ARIA SESSION SUMMARY
Course Elapsed Days: 1
Plan Fractions Treated to Date: 2
Plan Prescribed Dose Per Fraction: 3 Gy
Plan Total Fractions Prescribed: 10
Plan Total Prescribed Dose: 30 Gy
Reference Point Dosage Given to Date: 6 Gy
Reference Point Session Dosage Given: 3 Gy
Session Number: 2

## 2023-07-12 MED ORDER — SENNA 8.6 MG PO TABS
1.0000 | ORAL_TABLET | Freq: Every day | ORAL | Status: DC
  Administered 2023-07-12 – 2023-07-13 (×2): 8.6 mg via ORAL
  Filled 2023-07-12 (×2): qty 1

## 2023-07-12 MED ORDER — HYDROMORPHONE HCL 2 MG PO TABS
2.0000 mg | ORAL_TABLET | ORAL | Status: DC | PRN
  Administered 2023-07-12 (×3): 2 mg via ORAL
  Filled 2023-07-12 (×4): qty 1

## 2023-07-12 NOTE — Progress Notes (Signed)
 Palliative:  HPI: 68 y.o. male  with past medical history of stage IV prostate cancer, depression, anxiety, GERD, s/p laminectomy due to thoracic spine cord compression s/t metastatic disease 03/2023 admitted on 07/10/2023 with left hand weakness, pain, falls related to progression of cancer with epidural tumor spanning C5-T5 causing cord compression. No surgical options.   I met again with Mr. [place]. He is sitting up in recliner. He reports 6/10 pain in same left chest around shoulder and through back. Discussed with RN who reports up to recliner with ease and lower extremities continue to be strong. Mr. Carmickle and I review plans laid out from oncology with radiation therapy. He did not have any plans for for Pluvicto  as his treatment was just completed 07/05/23 and there were no plans for repeat treatment until 6 weeks later anyway. This gives us  time to focus on his current radiation therapy and improved management of his pain. He continues to desire ongoing treatment with hopes of time and improved quality of life.   I spent time discussing with him pain management. We reviewed oral opioid agents and he is not convinced that any of them will work. He also tells me that his fentanyl  patch does not help at all. I explained that I feel pretty certain that his pain levels would be much higher without fentanyl  patch and explained long acting pain management and how that works. I also explained that he may not have had much success with oral agents previously if the dosage was too low. Everyone is different on what medication and what dosage works best for them. He is hesitant to try oral agent but I did explain it is best to start now so we have time to adjust and titrate to find something to provide relief prior to discharge. He agrees with oral dilaudid  reporting that dilaudid  is the only medication he has had relief with.   All questions/concerns addressed. Emotional support provided.   Exam: Alert,  oriented. No distress. Sitting up in recliner. L hand weakness. Breathing regular, unlabored. Abd soft.   Plan:  - DNR - Continue cancer treatment  Symptom Management: Left chest/back pain: Fentanyl  patch 50 mcg/hr Gabapentin  900 mg BID Robaxin  750 mg q8h PRN Dilaudid  2 mg q3h PRN moderate pain Continue Dilaudid  IV 0.5 mg q2h PRN severe pain Bowel Regimen:  Senokot 1 tab daily. May titrate as needed.   45 min  Bernarda Kitty, NP Palliative Medicine Team Pager [phone] (Please see [email or web address] for schedule) Team Phone [phone]

## 2023-07-12 NOTE — Progress Notes (Signed)
  Progress Note   Patient: Nathan Berg FMW:[id number] DOB: May 06, 1956 DOA: 07/10/2023     2 DOS: the patient was seen and examined on 07/12/2023   Brief hospital course: 68 year old man PMH including stage IV prostate cancer presented with left upper extremity weakness and worsening back pain.  Previously in the hospital in September of last year and underwent decompressive laminectomy for metastatic disease involving the thoracic spine with cord compression at that time.  Following with oncology.  Developed left hand weakness and inability to grip things as well as some numbness.  Imaging revealed metastatic disease spanning from C5-L1 vertebral bodies.  EDP discussed with Dr. Colon who who recommended nonoperative management.  Patient will begin radiation therapy.  Consultants Oncology Neurosurgery Radiation oncology   Procedures/Events 1/5 admission for left hand weakness.   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 Discussed with his oncologist Dr. Tina, plan is radiation therapy.  Continue steroids.   Palliative care consulted for assistance with symptom management.   Normocytic anemia, likely secondary to malignancy/chronic disease. B12 within normal limits.  Follow clinically.  Appears stable currently.   Cancer related pain Palliative care consulted.  Continue present management.   Chemotherapy-induced Neuropathy Continue gabapentin     Anxiety and depression Continue lexapro  and xanax  PRN    GERD (gastroesophageal reflux disease) Continue PPI BID   Low serum vitamin B12 B12 within normal limits.  Continue oral replacement   Improving.  Likely home tomorrow.  Does not want to pursue any kind of therapy given home health therapy.  States he knows how to do the  exercises.    Subjective:  Feels about the same today.  Physical Exam: Vitals:   07/11/23 1810 07/11/23 2106 07/11/23 2300 07/12/23 0414  BP: (!) 127/90 118/75 128/76 131/72  Pulse: 78 80 78 69  Resp: 17 17 18 20   Temp: 98.3 F (36.8 C) 98.8 F (37.1 C) 99.2 F (37.3 C) 98 F (36.7 C)  TempSrc: Oral Oral Oral Oral  SpO2: 92% 91% 93% 92%  Weight:      Height:       Physical Exam Vitals reviewed.  Constitutional:      General: He is not in acute distress.    Appearance: He is not ill-appearing or toxic-appearing.  Cardiovascular:     Rate and Rhythm: Normal rate and regular rhythm.     Heart sounds: No murmur heard. Pulmonary:     Effort: Pulmonary effort is normal. No respiratory distress.     Breath sounds: No wheezing, rhonchi or rales.  Neurological:     Mental Status: He is alert.  Psychiatric:        Mood and Affect: Mood normal.        Behavior: Behavior normal.     Data Reviewed: No new data  Family Communication: none  Disposition: Status is: Inpatient Remains inpatient appropriate because: starting radiation     Time spent: 20 minutes  Author: Toribio Door, MD 07/12/2023 10:04 AM  For on call review [email or web address].

## 2023-07-12 NOTE — Plan of Care (Signed)

## 2023-07-13 ENCOUNTER — Other Ambulatory Visit (HOSPITAL_COMMUNITY): Payer: Self-pay

## 2023-07-13 ENCOUNTER — Ambulatory Visit
Admission: RE | Admit: 2023-07-13 | Discharge: 2023-07-13 | Disposition: A | Discharge status: Home / Self Care | Payer: Medicare Other | Source: Ambulatory Visit | Attending: Radiation Oncology | Admitting: Radiation Oncology

## 2023-07-13 ENCOUNTER — Other Ambulatory Visit: Payer: Self-pay

## 2023-07-13 DIAGNOSIS — K219 Gastro-esophageal reflux disease without esophagitis: Secondary | ICD-10-CM

## 2023-07-13 DIAGNOSIS — G893 Neoplasm related pain (acute) (chronic): Secondary | ICD-10-CM | POA: Diagnosis not present

## 2023-07-13 DIAGNOSIS — C7951 Secondary malignant neoplasm of bone: Secondary | ICD-10-CM | POA: Diagnosis not present

## 2023-07-13 DIAGNOSIS — D509 Iron deficiency anemia, unspecified: Secondary | ICD-10-CM

## 2023-07-13 DIAGNOSIS — F411 Generalized anxiety disorder: Secondary | ICD-10-CM

## 2023-07-13 DIAGNOSIS — G9520 Unspecified cord compression: Secondary | ICD-10-CM | POA: Diagnosis not present

## 2023-07-13 LAB — RAD ONC ARIA SESSION SUMMARY
Course Elapsed Days: 2
Plan Fractions Treated to Date: 3
Plan Prescribed Dose Per Fraction: 3 Gy
Plan Total Fractions Prescribed: 10
Plan Total Prescribed Dose: 30 Gy
Reference Point Dosage Given to Date: 9 Gy
Reference Point Session Dosage Given: 3 Gy
Session Number: 3

## 2023-07-13 MED ORDER — OXYCODONE HCL 5 MG PO TABS
10.0000 mg | ORAL_TABLET | Freq: Four times a day (QID) | ORAL | 0 refills | Status: AC | PRN
  Filled 2023-07-13: qty 40, 5d supply, fill #0

## 2023-07-13 MED ORDER — PANTOPRAZOLE SODIUM 40 MG PO TBEC
40.0000 mg | DELAYED_RELEASE_TABLET | Freq: Two times a day (BID) | ORAL | 1 refills | Status: AC
  Filled 2023-07-13 (×2): qty 60, 30d supply, fill #0

## 2023-07-13 MED ORDER — POLYETHYLENE GLYCOL 3350 17 GM/SCOOP PO POWD
17.0000 g | Freq: Every day | ORAL | 0 refills | Status: DC | PRN
  Filled 2023-07-13: qty 238, 14d supply, fill #0
  Filled 2023-07-13: qty 14, 14d supply, fill #0

## 2023-07-13 MED ORDER — FOLIC ACID 1 MG PO TABS
1.0000 mg | ORAL_TABLET | Freq: Every day | ORAL | 2 refills | Status: DC
  Filled 2023-07-13 (×2): qty 30, 30d supply, fill #0

## 2023-07-13 MED ORDER — FENTANYL 50 MCG/HR TD PT72
1.0000 | MEDICATED_PATCH | TRANSDERMAL | 0 refills | Status: DC
  Filled 2023-07-13 (×2): qty 5, 15d supply, fill #0

## 2023-07-13 MED ORDER — METHOCARBAMOL 750 MG PO TABS
750.0000 mg | ORAL_TABLET | Freq: Three times a day (TID) | ORAL | 0 refills | Status: DC | PRN
  Filled 2023-07-13 (×2): qty 30, 10d supply, fill #0

## 2023-07-13 MED ORDER — DEXAMETHASONE 2 MG PO TABS
ORAL_TABLET | ORAL | 0 refills | Status: DC
  Filled 2023-07-13: qty 70, 30d supply, fill #0
  Filled 2023-07-13: qty 72, 31d supply, fill #0

## 2023-07-13 MED ORDER — SENNA 8.6 MG PO TABS
2.0000 | ORAL_TABLET | Freq: Every day | ORAL | 0 refills | Status: DC
  Filled 2023-07-13 (×2): qty 120, 60d supply, fill #0

## 2023-07-13 MED ORDER — GABAPENTIN 300 MG PO CAPS
900.0000 mg | ORAL_CAPSULE | Freq: Two times a day (BID) | ORAL | 2 refills | Status: DC
  Filled 2023-07-13 (×2): qty 270, 45d supply, fill #0

## 2023-07-13 MED ORDER — HYDROMORPHONE HCL 2 MG PO TABS
2.0000 mg | ORAL_TABLET | ORAL | 0 refills | Status: DC | PRN
  Filled 2023-07-13 (×2): qty 20, 4d supply, fill #0

## 2023-07-13 NOTE — Discharge Instructions (Addendum)
 Please take all prescribed medications exactly as instructed including your regimen of pain medications as detailed in your after visit summary. Please consume a regular diet diet Please increase your physical activity as tolerated. Please maintain all outpatient follow-up appointments including follow-up with Dr. Tina with oncology as well as Dr. Patrcia with radiation oncology.   Please return to the emergency department if you develop worsening pain, fevers in excess of 100.4 F, weakness or inability to tolerate oral intake.

## 2023-07-13 NOTE — Discharge Summary (Addendum)
 Physician Discharge Summary   Patient: Nathan Berg MRN: [id number] DOB: Mar 07, 1956  Admit date:     07/10/2023  Discharge date: 07/13/23  Discharge Physician: Zachary JINNY Ba   PCP: Lanny Callander, MD   Recommendations at discharge:    Please take all prescribed medications exactly as instructed including your regimen of pain medications as detailed in your after visit summary. Please consume a regular diet diet Please increase your physical activity as tolerated. Please maintain all outpatient follow-up appointments including follow-up with Dr. Tina with oncology as well as attending your ongoing radiation treatments with Dr. Patrcia with radiation oncology.   Please return to the emergency department if you develop worsening pain, fevers in excess of 100.4 F, weakness or inability to tolerate oral intake.  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 Tina with oncology) presented with left upper extremity weakness and worsening back pain.  Previously in the hospital in September of last year and underwent decompressive laminectomy for metastatic disease involving the thoracic spine with cord compression at that time.  Patient has now presented to [facility] emergency department of 07/10/2023 with  complaints of worsening left upper extremity weakness and intractable back pain.    Upon evaluation in the emergency department developed left hand weakness and inability to grip things as well as some numbness.  Imaging revealed metastatic disease spanning from C5-L1 vertebral bodies.  EDP discussed with Dr. Colon who who recommended nonoperative management.  The hospitalist group was then called to assess the patient for admission to the hospital.    Dr. Tina with oncology was consulted and incoordination with Dr. Patrcia with radiation oncology patient was initiated on radiation therapy with the intention to complete a total of 10 doses.  Furthermore, palliative care was consulted and patient symptoms were managed with a combination of gabapentin , fentanyl  patch, Robaxin  and as needed oral and intravenous opiate based analgesics.    In the days that followed patient's symptoms and weakness slightly improved.  In light of the patient's improving clinical symptoms and need for ongoing radiation treatment patient is being at home in improved and stable condition on 07/13/2023 for ongoing care.  Patient is being discharged home with a regimen of fentanyl  patches, gabapentin , as needed Robaxin  and as needed oral Oxycodone  for breakthrough pain.  Patient is also being discharged home on a tapering regimen of dexamethasone .    Pain control - [facility] [place]  Controlled Substance Reporting System database was reviewed. and patient was instructed, not to drive, operate heavy machinery, perform activities at heights, swimming or participation in water  activities or provide baby-sitting services while on Pain, Sleep and Anxiety Medications; until their outpatient Physician has advised to do so again. Also recommended to not to take more than prescribed Pain, Sleep and Anxiety Medications.   Consultants: Dr. Tina with Oncology, Dr. Patrcia with Radiation Oncology  Procedures performed: Radiation therapy  1/6 and  1/8 Disposition: Home Diet recommendation:  Discharge Diet Orders (From admission, onward)     Start     Ordered   07/13/23 0000  Diet - low sodium heart healthy        07/13/23 1404           Regular diet  DISCHARGE MEDICATION: Allergies as of 07/13/2023       Reactions   Codeine Nausea And Vomiting   Penicillin G Other (See Comments)   Stomach upset        Medication List     STOP taking these medications    amantadine  100 MG capsule Commonly known as: SYMMETREL    lactulose  10 GM/15ML solution Commonly known as: CHRONULAC    lidocaine  5 % Commonly known as: LIDODERM        TAKE these medications    ALPRAZolam  1 MG tablet Commonly known as: XANAX  TAKE 1/2 TO 1 TABLET BY MOUTH TWICE DAILY AS NEEDED FOR ANXIETY. MUST HAVE OFFICE OR TELEMEDICINE VISIT FOR REFILLS   cyanocobalamin  1000 MCG tablet Commonly known as: VITAMIN B12 Take 1 tablet (1,000 mcg total) by mouth daily.   dexamethasone  2 MG tablet Commonly known as: DECADRON  Take 2 tablets (4 mg total) by mouth in the morning, at noon, and at bedtime for 1 day, THEN 2 tablets (4 mg total) in the morning and at bedtime for 3 days, THEN 1 tablet (2 mg total) in the morning and at bedtime for 27 days. Start taking on: July 13, 2023 What changed:  medication strength See the new instructions.   docusate sodium  100 MG capsule Commonly known as: Colace Take 1 capsule (100 mg total) by mouth 2 (two) times daily. What changed: when to take this   escitalopram  20 MG tablet Commonly known as: LEXAPRO  Take 1.5 tablets (30 mg total) by mouth daily.   fentaNYL  50 MCG/HR Commonly known as: DURAGESIC  Place 1 patch onto the skin every 3 (three) days for 3 doses.   folic acid  1 MG tablet Commonly known as: FOLVITE  Take 1 tablet (1 mg total) by mouth daily. Start taking on: July 14, 2023   gabapentin  300 MG capsule Commonly known as: Neurontin  Take 3 capsules (900 mg total) by mouth 2 (two) times  daily.   ibuprofen  200 MG tablet Commonly known as: ADVIL  Take 800 mg by mouth as needed for mild pain or moderate pain.   magnesium  oxide 400 (240 Mg) MG tablet Commonly known as: MAG-OX Take 400 mg by mouth daily.   melatonin 3 MG Tabs tablet Take 1 tablet (3 mg total) by mouth at bedtime as needed (insomnia).   methocarbamol  750 MG tablet Commonly known as: ROBAXIN  Take 1 tablet (750 mg total) by mouth every 8 (eight) hours as needed for muscle spasms.   oxycodone  5 MG capsule Commonly known as: OXY-IR Take 2 capsules (10 mg total) by mouth every 6 (six) hours as needed for up to 5 days (severe pain).   pantoprazole  40 MG tablet Commonly known as: PROTONIX  Take 1 tablet (40 mg total) by mouth 2 (two) times daily before a meal.   polyethylene glycol powder 17 GM/SCOOP powder Commonly known as: MiraLax  Take 17 g by mouth daily as needed.   QUEtiapine  100 MG tablet Commonly known as: SEROQUEL  Take 2 tablets (200 mg total) by mouth at bedtime.   senna 8.6 MG Tabs tablet Commonly known as: SENOKOT Take 2 tablets (17.2 mg total) by mouth at bedtime.  Hold for loose stools or diarrhea   traZODone  100 MG tablet Commonly known as: DESYREL  Take 100 mg by mouth at bedtime.        Follow-up Information     Tina Pauletta BROCKS, MD Follow up on 07/21/2023.   Specialty: Oncology Why: 1pm Contact information: [address] [place] [address] [phone]         Patrcia Kent, MD. Go on 07/14/2023.   Specialty: Hematology and Oncology Why: 10:30AM Contact information: [address] [place] [address] [phone]                 Discharge Exam: Fredricka Weights   07/10/23 1051  Weight: 84.5 kg    Constitutional: Awake alert and oriented x3, no associated distress.   Respiratory: clear to auscultation bilaterally, no wheezing, no crackles. Normal respiratory effort. No accessory muscle use.  Cardiovascular: Regular rate and rhythm, no  murmurs / rubs / gallops. No extremity edema. 2+ pedal pulses. No carotid bruits.  Abdomen: Abdomen is soft and nontender.  No evidence of intra-abdominal masses.  Positive bowel sounds noted in all quadrants.   Musculoskeletal: Pain in the left shoulder and arm with both passive and active range of motion of the left upper extremity  Condition at discharge: fair  The results of significant diagnostics from this hospitalization (including imaging, microbiology, ancillary and laboratory) are listed below for reference.   Imaging Studies: MR Cervical Spine W and Wo Contrast Result Date: 07/10/2023 CLINICAL DATA:  Metastatic disease evaluation EXAM: MRI CERVICAL AND THORACIC SPINE WITHOUT AND WITH CONTRAST TECHNIQUE: Multiplanar and multiecho pulse sequences of the cervical spine, to include the craniocervical junction and cervicothoracic junction, and the thoracic spine, were obtained without and with intravenous contrast. CONTRAST:  8mL GADAVIST  GADOBUTROL  1 MMOL/ML IV SOLN COMPARISON:  PET CT 05/04/23, MR T Spine 04/11/23 FINDINGS: MRI CERVICAL SPINE FINDINGS Alignment: Physiologic. Vertebrae: T1 hyperintense lesions are present at the C6 and C7 vertebral bodies, worirsome for osseous metastatic disease. T1 hypointense lesions are also present at the left-sided facet at C3. There is abnormal contrast enhancement in the ventral and dorsal epidural space spanning the C5-C7 vertebral body levels (series 7, image 10) Cord: Normal signal and morphology. Posterior Fossa, vertebral arteries, paraspinal tissues: Negative. Disc levels: There is severe spinal canal stenosis of the C5-C6, C6-C7, and C7-T1 levels. MRI THORACIC SPINE FINDINGS Alignment:  Physiologic. Vertebrae: There are multilevel T1 hypointense lesions lesions in the thoracic spine, most notably at T1-T5, T8, T10-T11. kyphoplasty changes at T3. Compared to prior exam there is a new/increased T2 hyperintense fluid collection in the dorsal epidural space  spanning the T3-T6 levels with associated contrast enhancement. While this could represent extraosseous tumor, epidural phlegmon is not excluded. There is also increased contrast enhancement in the ventral epidural space spanning the T2-T5 levels (series 7, image 12). Cord: There is T2 hyperintense cord signal abnormality of the T2-T3 level, which appears slightly more conspicuous than on 03/29/2023 Paraspinal and other soft tissues: Compared to prior exam there is a new peripherally enhancing 3.2 x 2.4 x 2.8 cm T2 hyperintense fluid collection of the laminectomy site (series 7, image 13). This is unlikely represent a postsurgical fluid collection as it was absent on recent prior MRI of the thoracic spine dated 04/11/2023. This is nonspecific, but infection is not excluded. There is a 1.9 cm retrocrural lymph node on the left (series 24, image 33), favored to represent nodal metastatic disease. This is new from PET CT 05/04/23. Disc  levels: There is persistent severe spinal canal stenosis of the T2-T3 level (series 24, image 8). There is now moderate spinal canal narrowing of the T4-T5 levels. IMPRESSION: 1. Redemonstrated multiple osseous metastases spanning the C5 to the L1 vertebral bodies, as above. 2. New/increased T2 hyperintense contrast-enhancing material in the dorsal epidural space spanning the C5-T6 levels. While this could represent extraosseous tumor, epidural phlegmon is not excluded. 3. Increased contrast enhancement in the ventral epidural space spanning the C5-T5 levels, which could represent progressive epidural tumor, but infection is not excluded. 4. There is now severe spinal canal stenosis C5-T3 levels. 5. New peripherally enhancing 3.2 x 2.4 x 2.8 cm fluid collection at the laminectomy site. This was not present recent prior MRI of the thoracic spine dated 04/11/2023. Although this is nonspecific and a post-surgical fluid collection is a consideration, infection is not excluded. 6. New 1.8 cm  retrocrural lymph node on the left. Consider further evaluation with a repeat CT chest abdomen/pelvis to better characterize the extent of metastatic disease. Electronically Signed   By: Lyndall Gore M.D.   On: 07/10/2023 14:20   MR THORACIC SPINE W WO CONTRAST Result Date: 07/10/2023 CLINICAL DATA:  Metastatic disease evaluation EXAM: MRI CERVICAL AND THORACIC SPINE WITHOUT AND WITH CONTRAST TECHNIQUE: Multiplanar and multiecho pulse sequences of the cervical spine, to include the craniocervical junction and cervicothoracic junction, and the thoracic spine, were obtained without and with intravenous contrast. CONTRAST:  8mL GADAVIST  GADOBUTROL  1 MMOL/ML IV SOLN COMPARISON:  PET CT 05/04/23, MR T Spine 04/11/23 FINDINGS: MRI CERVICAL SPINE FINDINGS Alignment: Physiologic. Vertebrae: T1 hyperintense lesions are present at the C6 and C7 vertebral bodies, worirsome for osseous metastatic disease. T1 hypointense lesions are also present at the left-sided facet at C3. There is abnormal contrast enhancement in the ventral and dorsal epidural space spanning the C5-C7 vertebral body levels (series 7, image 10) Cord: Normal signal and morphology. Posterior Fossa, vertebral arteries, paraspinal tissues: Negative. Disc levels: There is severe spinal canal stenosis of the C5-C6, C6-C7, and C7-T1 levels. MRI THORACIC SPINE FINDINGS Alignment:  Physiologic. Vertebrae: There are multilevel T1 hypointense lesions lesions in the thoracic spine, most notably at T1-T5, T8, T10-T11. kyphoplasty changes at T3. Compared to prior exam there is a new/increased T2 hyperintense fluid collection in the dorsal epidural space spanning the T3-T6 levels with associated contrast enhancement. While this could represent extraosseous tumor, epidural phlegmon is not excluded. There is also increased contrast enhancement in the ventral epidural space spanning the T2-T5 levels (series 7, image 12). Cord: There is T2 hyperintense cord signal  abnormality of the T2-T3 level, which appears slightly more conspicuous than on 03/29/2023 Paraspinal and other soft tissues: Compared to prior exam there is a new peripherally enhancing 3.2 x 2.4 x 2.8 cm T2 hyperintense fluid collection of the laminectomy site (series 7, image 13). This is unlikely represent a postsurgical fluid collection as it was absent on recent prior MRI of the thoracic spine dated 04/11/2023. This is nonspecific, but infection is not excluded. There is a 1.9 cm retrocrural lymph node on the left (series 24, image 33), favored to represent nodal metastatic disease. This is new from PET CT 05/04/23. Disc levels: There is persistent severe spinal canal stenosis of the T2-T3 level (series 24, image 8). There is now moderate spinal canal narrowing of the T4-T5 levels. IMPRESSION: 1. Redemonstrated multiple osseous metastases spanning the C5 to the L1 vertebral bodies, as above. 2. New/increased T2 hyperintense contrast-enhancing material in the dorsal epidural  space spanning the C5-T6 levels. While this could represent extraosseous tumor, epidural phlegmon is not excluded. 3. Increased contrast enhancement in the ventral epidural space spanning the C5-T5 levels, which could represent progressive epidural tumor, but infection is not excluded. 4. There is now severe spinal canal stenosis C5-T3 levels. 5. New peripherally enhancing 3.2 x 2.4 x 2.8 cm fluid collection at the laminectomy site. This was not present recent prior MRI of the thoracic spine dated 04/11/2023. Although this is nonspecific and a post-surgical fluid collection is a consideration, infection is not excluded. 6. New 1.8 cm retrocrural lymph node on the left. Consider further evaluation with a repeat CT chest abdomen/pelvis to better characterize the extent of metastatic disease. Electronically Signed   By: Lyndall Gore M.D.   On: 07/10/2023 14:20   NM PLUVICTO  ADMINISTRATION Result Date: 07/05/2023 CLINICAL DATA:  Castrate  resistant prostate adenocarcinoma. Trial of taxing chemotherapy with adverse effects. PSMA avid skeletal metastasis identified on is made PET scan 05/05/2023. EXAM: NUCLEAR MEDICINE PLUVICTO  INJECTION TECHNIQUE: Infusion: The nuclear medicine technologist and I personally verified the dose activity to be delivered as specified in the written directive, and verified the patient identification via 2 separate methods. Initial flush of the intravenous catheter was performed was sterile saline. The dose syringe was connected to the catheter and the Lu-177 Pluvicto  administered over a 1 to 10 min infusion. Single 10 cc lushes with normal saline follow the dose. No complications were noted. The entire IV tubing, venocatheter, stopcock and syringes was removed in total, placed in a disposal bag and sent for assay of the residual activity, which will be reported at a later time in our EMR by the physics staff. Pressure was applied to the venipuncture site, and a compression bandage placed. Patient monitored for 1 hour following infusion. Radiation Safety personnel were present to perform the discharge survey, as detailed on their documentation. After a short period of observation, the patient had his IV removed. RADIOPHARMACEUTICALS:  Two hundred three microcuries Lu-177 PLUVICTO  FINDINGS: Current Infusion: 1 Planned Infusions: 6 Patient presented to nuclear medicine for treatment. The patient's most recent blood counts were reviewed and remains a good candidate to proceed with Lu-177 Pluvicto . Patient chronically anemic with hemoglobin equal 8.3. White blood cell count improved to 3.21.4. Normal renal function liver function. PSA equal 34.9 The patient was situated in an infusion suite with a contact barrier placed under the arm. Intravenous access was established, using sterile technique, and a normal saline infusion from a syringe was started. Micro-dosimetry: The prescribed radiation activity was assayed and confirmed to  be within specified tolerance. IMPRESSION: Current Infusion: 1 Planned Infusions: 6 The patient tolerated the infusion well. The patient will return in 6 weeks for ongoing care. Electronically Signed   By: Jackquline Boxer M.D.   On: 07/05/2023 14:48   NM Radiologist Eval And Mgmt Result Date: 06/14/2023 EXAM: NEW PATIENT OFFICE VISIT CHIEF COMPLAINT: 68 year old male with castrate resistant metastatic prostate carcinoma. Patient present 2015 with Gleason 4+3=7 prostate adenocarcinoma PSA equal 9.1. Subsequent robotic assisted laparoscopic radical prostatectomy bilateral lymph node dissection. Salvage radiation therapy December 2015. Six months and deprivation. Progression of disease March 2022 on PSMA PET scan. Trial taxane chemotherapy with significant toxicities. Patient currently on Lupron  and Zometa . Evaluation for radiotherapy with Pluvicto   (Lu 177 PSMA) . Patient has extensive metastatic bone disease on PSMA PET scan 05/05/2023. HISTORY OF PRESENT ILLNESS: See epic note REVIEW OF SYSTEMS: See epic note PHYSICAL EXAMINATION: See epic note ASSESSMENT  AND PLAN: 1. Patient is adequate patient for a Lu cm 177 PSMA peptide receptor radiotherapy. 2. 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 Trial of taxane therapy with significant toxicities. 3. Patient does have significant depressed marrow with anemia and leukopenia related to chemotherapy. Hgb = 8.8. WBC = 1.4K. This will be monitored closely. Normal renal function and hepatic function. 4. Benefits and risks of therapy discussed with patient. Primary benefit being progression-free survival. Primary risk marrow toxicity. Additional toxicities of renal toxicity and xerostomia were discussed 5. Patient will undergo 6 treatments of the Lu 177 PSMA therapy space 6 weeks apart over six-month interval. Patient to return to oncologist 7 to 10 days prior to subsequent therapy for CBC, CMP and PSA.  Electronically Signed   By: Jackquline Boxer M.D.   On: 06/14/2023 15:58    Microbiology: Results for orders placed or performed during the hospital encounter of 04/13/23  Varicella-zoster by PCR     Status: Abnormal   Collection Time: 04/21/23  9:15 AM   Specimen: Sterile Swab  Result Value Ref Range Status   Varicella-Zoster, PCR Positive (A) Negative Final    Comment: (NOTE) Varicella Zoster Virus DNA detected. This test was developed and its performance characteristics determined by [facility].  It has not been cleared or approved by the Food and Drug Administration.  The FDA has determined that such clearance or approval is not necessary. Performed At: [facility] [address] [place], [place] [id number] Jennette Shorter MD Ey:[phone]     Labs: CBC: Recent Labs  Lab 07/10/23 1212 07/11/23 0605  WBC 4.1 3.2*  NEUTROABS 2.8  --   HGB 8.5* 8.7*  HCT 28.2* 28.6*  MCV 81.0 81.0  PLT 291 299   Basic Metabolic Panel: Recent Labs  Lab 07/10/23 1212 07/11/23 0605  NA 134* 133*  K 4.2 4.1  CL 100 102  CO2 25 24  GLUCOSE 102* 117*  BUN 12 11  CREATININE 0.60* 0.49*  CALCIUM  8.1* 8.1*   Liver Function Tests: Recent Labs  Lab 07/10/23 1212  AST 21  ALT 33  ALKPHOS 76  BILITOT 0.3  PROT 5.7*  ALBUMIN  2.7*   CBG: No results for input(s): GLUCAP in the last 168 hours.  Discharge time spent: greater than 30 minutes.  Signed: Zachary JINNY Ba, MD [facility] 07/13/2023

## 2023-07-13 NOTE — Progress Notes (Signed)
 Palliative:  HPI: 68 y.o. male  with past medical history of stage IV prostate cancer, depression, anxiety, GERD, s/p laminectomy due to thoracic spine cord compression s/t metastatic disease 03/2023 admitted on 07/10/2023 with left hand weakness, pain, falls related to progression of cancer with epidural tumor spanning C5-T5 causing cord compression. No surgical options.   I met again today with Nathan Berg. He reports good pain control. He is able to get up to chair and to the bathroom independently. He is stable for discharge home. Nathan Berg feels ready to go home but has concerns. He voices concern for transportation to get to his radiation appointment. I offered to reach out to cancer center for them to assist with arranging transport. Nathan Berg shares that he believes he can have assistance from his daughter-in-law and friends/neighbors to give him a ride. He will reach out to them. I did reach out to cancer center staff to let them know there are concerns with transportation and Nathan Berg may need assistance with resources but does not want to pursue this at this time. Nathan Berg agrees with discharge home. I discussed with Dr. Kenard as well.   Exam: Alert, oriented. No distress. Sitting up in recliner. L hand weakness. Breathing regular, unlabored. Abd soft.    Plan:  - DNR - Continue cancer treatment   Symptom Management: Left chest/back pain: Fentanyl  patch 50 mcg/hr Gabapentin  900 mg BID Robaxin  750 mg q8h PRN Dilaudid  2 mg q3h PRN moderate pain Continue Dilaudid  IV 0.5 mg q2h PRN severe pain Bowel Regimen:  Senokot 1 tab daily. May titrate as needed.   35 min  Bernarda Kitty, NP Palliative Medicine Team Pager [phone] (Please see [email or web address] for schedule) Team Phone [phone]

## 2023-07-13 NOTE — Progress Notes (Signed)
 AVS reviewed w/ pt & son - both verbalized an understanding- no other questions at this time. TOC meds delivered in a secure bag to patient in room. Small skin tear noted to R hand- dressing changed prior to pt dressing for d/c to home.PIV removed as noted

## 2023-07-14 ENCOUNTER — Inpatient Hospital Stay
Admission: RE | Admit: 2023-07-14 | Discharge: 2023-07-14 | Disposition: A | Discharge status: Home / Self Care | Payer: Self-pay | Source: Ambulatory Visit | Attending: Radiation Oncology | Admitting: Radiation Oncology

## 2023-07-14 ENCOUNTER — Other Ambulatory Visit: Payer: Self-pay

## 2023-07-14 DIAGNOSIS — C7951 Secondary malignant neoplasm of bone: Secondary | ICD-10-CM | POA: Diagnosis present

## 2023-07-14 DIAGNOSIS — C61 Malignant neoplasm of prostate: Secondary | ICD-10-CM | POA: Diagnosis present

## 2023-07-14 LAB — RAD ONC ARIA SESSION SUMMARY
Course Elapsed Days: 3
Plan Fractions Treated to Date: 4
Plan Prescribed Dose Per Fraction: 3 Gy
Plan Total Fractions Prescribed: 10
Plan Total Prescribed Dose: 30 Gy
Reference Point Dosage Given to Date: 12 Gy
Reference Point Session Dosage Given: 3 Gy
Session Number: 4

## 2023-07-15 ENCOUNTER — Other Ambulatory Visit: Payer: Self-pay

## 2023-07-15 ENCOUNTER — Ambulatory Visit
Admission: RE | Admit: 2023-07-15 | Discharge: 2023-07-15 | Disposition: A | Discharge status: Home / Self Care | Payer: Medicare Other | Source: Ambulatory Visit | Attending: Radiation Oncology | Admitting: Radiation Oncology

## 2023-07-15 DIAGNOSIS — C61 Malignant neoplasm of prostate: Secondary | ICD-10-CM | POA: Diagnosis not present

## 2023-07-15 LAB — RAD ONC ARIA SESSION SUMMARY
Course Elapsed Days: 4
Plan Fractions Treated to Date: 5
Plan Prescribed Dose Per Fraction: 3 Gy
Plan Total Fractions Prescribed: 10
Plan Total Prescribed Dose: 30 Gy
Reference Point Dosage Given to Date: 15 Gy
Reference Point Session Dosage Given: 3 Gy
Session Number: 5

## 2023-07-19 ENCOUNTER — Other Ambulatory Visit: Payer: Self-pay

## 2023-07-19 ENCOUNTER — Ambulatory Visit
Admission: RE | Admit: 2023-07-19 | Discharge: 2023-07-19 | Disposition: A | Discharge status: Home / Self Care | Payer: Medicare Other | Source: Ambulatory Visit | Attending: Radiation Oncology | Admitting: Radiation Oncology

## 2023-07-19 DIAGNOSIS — C61 Malignant neoplasm of prostate: Secondary | ICD-10-CM | POA: Diagnosis not present

## 2023-07-19 LAB — RAD ONC ARIA SESSION SUMMARY
Course Elapsed Days: 8
Plan Fractions Treated to Date: 6
Plan Prescribed Dose Per Fraction: 3 Gy
Plan Total Fractions Prescribed: 10
Plan Total Prescribed Dose: 30 Gy
Reference Point Dosage Given to Date: 18 Gy
Reference Point Session Dosage Given: 3 Gy
Session Number: 6

## 2023-07-20 ENCOUNTER — Other Ambulatory Visit: Payer: Self-pay

## 2023-07-20 ENCOUNTER — Ambulatory Visit
Admission: RE | Admit: 2023-07-20 | Discharge: 2023-07-20 | Disposition: A | Discharge status: Home / Self Care | Payer: Medicare Other | Source: Ambulatory Visit | Attending: Radiation Oncology | Admitting: Radiation Oncology

## 2023-07-20 DIAGNOSIS — C61 Malignant neoplasm of prostate: Secondary | ICD-10-CM | POA: Diagnosis not present

## 2023-07-20 DIAGNOSIS — D509 Iron deficiency anemia, unspecified: Secondary | ICD-10-CM | POA: Insufficient documentation

## 2023-07-20 LAB — RAD ONC ARIA SESSION SUMMARY
Course Elapsed Days: 9
Plan Fractions Treated to Date: 7
Plan Prescribed Dose Per Fraction: 3 Gy
Plan Total Fractions Prescribed: 10
Plan Total Prescribed Dose: 30 Gy
Reference Point Dosage Given to Date: 21 Gy
Reference Point Session Dosage Given: 3 Gy
Session Number: 7

## 2023-07-20 NOTE — Assessment & Plan Note (Signed)
Folic acid 1mg daily

## 2023-07-20 NOTE — Progress Notes (Signed)
Palliative Medicine [facility]  [phone] [phone]) [phone]   Name: Nathan Berg Date: 07/20/2023 MRN: [id number]  DOB: Apr 30, 1956  Patient Care Team: Malachy Mood, MD as PCP - General (Hematology) Malachy Mood, MD as Attending Physician (Hematology and Oncology) Barbie Banner, MD (Family Medicine)    INTERVAL HISTORY: Nathan Berg is a 68 y.o. 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 Palliative ask to see for symptom management and goals of care.     SOCIAL HISTORY:    Nathan Berg reports that he has never smoked. He has never used smokeless tobacco. He reports current alcohol use. He reports that he does not use drugs.  ADVANCE DIRECTIVES:  DNR/MOST, HCPOA, Living Will on file  CODE STATUS: DNR  PAST MEDICAL HISTORY: Past Medical History:  Diagnosis Date   Anxiety    Arthritis    Cancer (HCC)    Prostate cancer   Depression    GERD (gastroesophageal reflux disease)    H/O seasonal allergies    History of kidney stones    x1   Prostate cancer (HCC)     ALLERGIES:  is allergic to codeine and penicillin g.  MEDICATIONS:  Current Outpatient Medications  Medication Sig Dispense Refill   ALPRAZolam (XANAX) 1 MG tablet TAKE 1/2 TO 1 TABLET BY MOUTH TWICE DAILY AS NEEDED FOR ANXIETY. MUST HAVE OFFICE OR TELEMEDICINE VISIT FOR REFILLS     cyanocobalamin 1000 MCG tablet Take 1 tablet (1,000 mcg total) by mouth daily. 30 tablet 0   dexamethasone (DECADRON) 2 MG tablet Take 2 tablets (4 mg total) by mouth in the morning, at noon, and at bedtime for 1 day, THEN 2 tablets (4 mg total) in the morning and at bedtime for 3 days, THEN 1 tablet (2 mg total) in the morning and at bedtime for 27 days. 72 tablet 0   docusate sodium (COLACE) 100 MG capsule Take 1 capsule (100 mg total) by mouth 2 (two)  times daily. (Patient taking differently: Take 100 mg by mouth daily.) 60 capsule 2   escitalopram (LEXAPRO) 20 MG tablet Take 1.5 tablets (30 mg total) by mouth daily. 45 tablet 0   fentaNYL (DURAGESIC) 50 MCG/HR Place 1 patch onto the skin every 3 (three) days for 3 doses. 5 patch 0   folic acid (FOLVITE) 1 MG tablet Take 1 tablet (1 mg total) by mouth daily. 30 tablet 2   gabapentin (NEURONTIN) 300 MG capsule Take 3 capsules (900 mg total) by mouth 2 (two) times daily. 270 capsule 2   ibuprofen (ADVIL) 200 MG tablet Take 800 mg by mouth as needed for mild pain or moderate pain.     magnesium oxide (MAG-OX) 400 (240 Mg) MG tablet Take 400 mg by mouth daily.     melatonin 3 MG TABS tablet Take 1 tablet (3 mg total) by mouth at bedtime as needed (insomnia).     methocarbamol (ROBAXIN) 750 MG tablet Take 1 tablet (750 mg total) by mouth every 8 (eight) hours as needed for muscle spasms. 30 tablet 0   pantoprazole (PROTONIX) 40 MG tablet Take 1 tablet (40 mg total) by mouth 2 (two) times daily before a meal. 60 tablet 1   polyethylene glycol powder (MIRALAX) 17 GM/SCOOP powder Take 17 g by mouth daily as needed. 238 g 0   QUEtiapine (SEROQUEL) 100 MG  tablet Take 2 tablets (200 mg total) by mouth at bedtime. 60 tablet 0   senna (SENOKOT) 8.6 MG TABS tablet Take 2 tablets (17.2 mg total) by mouth at bedtime. Hold for loose stools or diarrhea 120 tablet 0   traZODone (DESYREL) 100 MG tablet Take 100 mg by mouth at bedtime.     No current facility-administered medications for this visit.    VITAL SIGNS: There were no vitals taken for this visit. There were no vitals filed for this visit.  Estimated body mass index is 26.73 kg/m as calculated from the following:   Height as of 07/10/23: 5\' 10"  (1.778 m).   Weight as of 07/10/23: 186 lb 4.6 oz (84.5 kg).   PERFORMANCE STATUS (ECOG) : 1 - Symptomatic but completely ambulatory   Physical Exam General: NAD Cardiovascular: regular rate and  rhythm Pulmonary: regular, even, unlabored Abdomen: soft, non-tender Extremities: right thumb with mild edema and tenderness Psychological: A/O x 4, restricted affect  IMPRESSION:  Nathan Berg presents to clinic for follow-up s/p recent hospitalization. It has been several months since previous encounter as patient without palliative needs until most recently with uncontrolled pain. No family present. He is alert and able to appropriately engage in discussions. Denies nausea, vomiting, or diarrhea. Is managing his constipation with daily use of Miralax and Senna. Appetite is fair. Some days are better than others.   Nathan Berg is complaining of persistent and severe chest wall and arm pain, particularly at night. The pain is localized under the left arm and has been consistent. The pain is so severe that it often leads to moaning and groaning for hours when attempting to sleep at night. He has found some relief by delaying bedtime until about 1 AM allowing for medication use.   The patient confirms current regimen consist of gabapentin, Advil, Robaxin and oxycodone for pain management, as well as a fentanyl patch. His spouse manages the medication. Despite this regimen, Nathan Berg reports that the pain is often at a level of 10 on a scale of 0 to 10. The pain may decrease to around 8.5 or 9 when lying in bed, and possibly to a 5 when the patient is able to fall asleep.  Nathan Berg reports that he is unsure whether the fentanyl patch is effective but continues to use it out of concern that the pain might be worse without it. After extensive discussions he is not taking his oxycodone as often as he could for his severe pain. Education provided on use of 1-2 tablets every 4 hours as needed for severe pain. He understands we will plan to continue close monitoring and further adjust regimen as needed. If no improvement will plan to increase Fentanyl patch at follow-up.   The patient is currently undergoing  radiation treatment, with only a few doses remaining. The patient hopes that the completion of this treatment will provide some relief from the pain.  All questions answered and support provided.  Goals of Care: We continued discussion regarding Nathan Berg's current illness and what it means in the larger context of his on-going co-morbidities. Natural disease trajectory and expectations were discussed.   Nathan Berg is clear in his expressed wishes to continue to treat the treatable. He is taking life one day at a time.   Nathan Berg completed HCPOA and Living Will documents at [facility] earlier this week.  We discussed the importance of continued conversation with family and their medical providers regarding overall plan of care and  treatment options, ensuring decisions are within the context of the patients values and GOCs.   Assessment and Plan  Cancer-related Pain Severe pain (10/10) in the chest and arm, not well controlled with current regimen (Fentanyl patch, Gabapentin, Advil, Oxycodone, Robaxin). Patient reports some relief with sleep and medication, but still experiences significant pain. -Increase Oxycodone to 2 tablets every 4 hours as needed for pain. -Consider adjusting Fentanyl patch dosage if no improvement with increased Oxycodone. -Continue Gabapentin twice daily, Robaxin, and Advil as prescribed. -Check in on 07/25/2023 to assess pain control and make further adjustments if necessary.  Bone Health Receiving Zometa infusions for bone health related to cancer treatment. -Continue Zometa infusions as scheduled.  Mental Health Taking Lexapro and Xanax for anxiety/depression. -Continue Lexapro and Xanax as prescribed.  Follow-up Next appointment scheduled for 08/10/2023. If pain is not controlled or worsens, patient to contact provider sooner.  Patient expressed understanding and was in agreement with this plan. He also understands that He can call the clinic at any time  with any questions, concerns, or complaints.   Any controlled substances utilized were prescribed in the context of palliative care. PDMP has been reviewed.   Willette Alma, AGPCNP-BC  Palliative Medicine Team/[facility]

## 2023-07-20 NOTE — Assessment & Plan Note (Signed)
 Continue radiation with Dr. Lorri Rota

## 2023-07-20 NOTE — Progress Notes (Signed)
Patient Care Team: Malachy Mood, MD as PCP - General (Hematology) Malachy Mood, MD as Attending Physician (Hematology and Oncology) Barbie Banner, MD (Family Medicine)  Clinic Day:  07/21/2023  Referring physician: Malachy Mood, MD  ASSESSMENT & PLAN:   Assessment & Plan: 68 y.o. man with history of GERD, prostate cancer being seen at Medical oncology for prostate cancer.   Current diagnosis: mCRPC Germline: performed. Pending Somatic: ATM. TP53. TMB 8.7m/MB Initial diagnosis: 2015. 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 Kathrynn Running  Prostate cancer [facility]) Continue with Pluvicto Monitor PSA  Monitor CBC Last lupron was on 10/16 at 30 mg. Will resume in Feb Last zometa on 10/16. Will resume in January  Folate deficiency Folic acid 1 mg daily  IDA (iron deficiency anemia) Will give two doses of feraheme  Cancer related pain gabapentin 900 mg 2 times daily which he has been taking and tolerating well. Refilled today Fentanyl patch 50 mcg. Refilled today Dexamethasone 2 mg twice daily for 3 weeks, then 2 mg daily. Refilled today   Keep appt with me next months. Pluvicto next month.  The patient understands the plans discussed today and is in agreement with them.  He knows to contact our office if he develops concerns prior to his next appointment.  Melven Sartorius, MD  [facility] - A DEPT OF [facility] [address] [place] [address] Dept: [phone] Dept Fax: [phone]    Orders Placed This Encounter  Procedures   CBC with Differential (Cancer Center Only)    Standing Status:   Future    Expiration Date:   07/20/2024   CMP (Cancer Center only)    Standing Status:   Future    Expiration Date:   07/20/2024   Prostate-Specific AG, Serum    Standing Status:   Future    Expiration Date:   07/20/2024   Testosterone    Standing Status:   Future    Expiration Date:   07/20/2024   Lactate dehydrogenase    Standing Status:   Future    Expiration Date:   07/20/2024     CHIEF COMPLAINT:  CC: mCRPC  Current Treatment:  Pluvicto  INTERVAL HISTORY:  Jaree is here today for repeat clinical assessment.   Pain   I have reviewed the past medical history, past surgical history, social history and family history with the patient and they are unchanged from previous note.  ALLERGIES:  is allergic to codeine and penicillin g.  MEDICATIONS:  Current Outpatient Medications  Medication Sig Dispense Refill   ALPRAZolam (XANAX) 1 MG tablet TAKE 1/2 TO 1 TABLET BY MOUTH TWICE DAILY AS NEEDED FOR ANXIETY. MUST HAVE OFFICE OR TELEMEDICINE VISIT FOR REFILLS     cyanocobalamin 1000 MCG tablet Take 1 tablet (1,000 mcg total) by mouth daily. 30 tablet 0   dexamethasone (DECADRON) 2 MG tablet Take twice daily for 3 weeks and take the afternoon dose earlier at 4 pm to have better sleeps not at bedtime. Decrease to 2 mg daily after 3 weeks  until next visit. 120 tablet 0   docusate sodium (COLACE) 100 MG capsule Take 1 capsule (100 mg total) by mouth 2 (two) times daily. (Patient taking differently: Take 100 mg by mouth daily.) 60 capsule 2   escitalopram (LEXAPRO) 20 MG tablet Take 1.5 tablets (30 mg total) by mouth daily. 45 tablet 0   fentaNYL (DURAGESIC) 50 MCG/HR Place 1 patch onto the skin every 3 (three) days. 10 patch 0   folic acid (FOLVITE) 1 MG tablet Take 1 tablet (1 mg total) by mouth daily. 30 tablet 2   gabapentin (NEURONTIN) 300 MG capsule Take 3 capsules (900 mg  total) by mouth 2 (two) times daily. 270 capsule 2   ibuprofen (ADVIL) 200 MG tablet Take 800 mg by mouth as needed for mild pain or moderate pain.     magnesium oxide (MAG-OX) 400 (240 Mg) MG tablet Take 400 mg by mouth daily.     melatonin 3 MG TABS tablet Take 1 tablet (3 mg total) by mouth at bedtime as needed (insomnia).     methocarbamol (ROBAXIN) 750 MG tablet Take 1 tablet (750 mg total) by mouth every 8 (eight) hours as needed for muscle spasms. 30 tablet 0   pantoprazole (PROTONIX) 40 MG tablet Take 1 tablet (40 mg total) by mouth 2 (two) times daily before a meal. 60 tablet 1   polyethylene glycol powder (MIRALAX) 17 GM/SCOOP powder Take 17 g by mouth daily as needed. 238 g 0   senna (SENOKOT) 8.6 MG TABS tablet Take 2 tablets (17.2 mg total) by mouth at bedtime. Hold for loose stools or diarrhea 120 tablet 0   traZODone (DESYREL) 100 MG tablet Take 1 tablet (100 mg total) by mouth at bedtime. 30 tablet 1   No current facility-administered medications for this visit.    HISTORY OF PRESENT ILLNESS:   Oncology History Overview Note   Cancer Staging  Malignant neoplasm of prostate (HCC) Staging form: Prostate, AJCC 7th Edition - Clinical: Stage IV (TX, NX, M1, PSA: Less than 10, Gleason 8-10) - Signed by Benjiman Core, MD on 04/30/2021     Prostate cancer (HCC)  09/19/2013 Initial Diagnosis   Malignant neoplasm of prostate (HCC)   04/30/2021 Cancer Staging   Staging form: Prostate, AJCC 7th Edition - Clinical: Stage IV (TX, NX, M1, PSA: Less than 10, Gleason 8-10) - Signed by Benjiman Core, MD on 04/30/2021   03/11/2022 Imaging    IMPRESSION: 1. Multifocal intense radiotracer avid skeletal metastasis again noted involving the axillary skeleton. No change in pattern or number of lesions. No new lesions identified. 2. The radiotracer activity of the lesions; however, is uniformly increased from comparison exam. 3. The sclerotic pattern of the lesions on comparison CT is  a similar. One lesion at T3 as new lytic component. 4. No visceral metastasis.  No new nodal disease.     12/09/2022 PET scan    IMPRESSION: 1. 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 2. Metastatic skeletal lesion in the RIGHT iliac wing is increased in size compared to prior. 3. Other than the 2 above lesions, the pattern and intensity of multifocal radiotracer avid skeletal metastasis is unchanged. No new lesions identified. 4. No evidence of nodal metastasis or visceral metastasis.     03/09/2023 Tumor Marker   PSA 25   04/19/2023 - 05/02/2023 Radiation Therapy   SBRT T spine 40 Gy/5 fx   05/04/2023 PET scan   PSMA  PET 1. Interval increase in size and degree of tracer uptake associated with multifocal bone metastases. 2. New tracer avid nodule within the medial right upper lobe is compatible with pulmonary metastasis. 3. Additional small scattered lung nodules are identified which are too small to characterize by PET-CT but appear new from the previous exam. 4.  Aortic Atherosclerosis (ICD10-I70.0).   05/04/2023 Tumor Marker   PSA 44.3   05/18/2023 - 05/18/2023 Chemotherapy   Patient is on Treatment Plan : PROSTATE Docetaxel (50) + Prednisone q14d      Genetic Testing   [facility] CancerNext-Expanded Panel+RNA was Negative. Report date is 05/26/2023.   The CancerNext-Expanded gene panel offered by [facility] and includes sequencing, rearrangement, and RNA analysis for the following 71 genes: AIP, ALK, APC, ATM, AXIN2, BAP1, BARD1, BMPR1A, BRCA1, BRCA2, BRIP1, CDC73, CDH1, CDK4, CDKN1B, CDKN2A, CHEK2, CTNNA1, DICER1, FH, FLCN, KIF1B, LZTR1, MAX, MEN1, MET, MLH1, MSH2, MSH3, MSH6, MUTYH, NF1, NF2, NTHL1, PALB2, PHOX2B, PMS2, POT1, PRKAR1A, PTCH1, PTEN, RAD51C, RAD51D, RB1, RET, SDHA, SDHAF2, SDHB, SDHC, SDHD, SMAD4, SMARCA4, SMARCB1, SMARCE1, STK11, SUFU, TMEM127, TP53, TSC1, TSC2, and VHL (sequencing and  deletion/duplication); EGFR, EGLN1, HOXB13, KIT, MITF, PDGFRA, POLD1, and POLE (sequencing only); EPCAM and GREM1 (deletion/duplication only).         REVIEW OF SYSTEMS:   All relevant systems were reviewed with the patient and are negative.   VITALS:  Blood pressure 99/74, pulse 76, temperature (!) 97.2 F (36.2 C), temperature source Temporal, weight 168 lb 12.8 oz (76.6 kg), SpO2 93%.  Wt Readings from Last 3 Encounters:  07/21/23 168 lb 12.8 oz (76.6 kg)  07/10/23 186 lb 4.6 oz (84.5 kg)  05/31/23 186 lb 3.2 oz (84.5 kg)    Body mass index is 24.22 kg/m.  Performance status (ECOG): 2 - Symptomatic, <50% confined to bed  PHYSICAL EXAM:   GENERAL:alert, no distress and comfortable SKIN: skin color normal, no rashes  EYES: normal, sclera clear OROPHARYNX: no exudate, no erythema    LUNGS: clear to auscultation with normal breathing effort.  No wheeze or rales HEART: regular rate & rhythm and no murmurs and no lower extremity edema ABDOMEN: abdomen soft, non-tender and nondistended Musculoskeletal: no edema NEURO: alert, fluent speech, no focal motor/sensory deficits.  Strength and sensation equal bilaterally.  LABORATORY DATA:  I have reviewed the data as listed    Component Value Date/Time   NA 136 07/21/2023 1216   K 3.9 07/21/2023 1216   CL 101 07/21/2023 1216   CO2 29 07/21/2023 1216   GLUCOSE 109 (H) 07/21/2023 1216   BUN 12 07/21/2023 1216   CREATININE 0.60 (L) 07/21/2023 1216   CALCIUM 8.4 (L) 07/21/2023 1216   PROT 6.2 (L) 07/21/2023 1216   ALBUMIN 3.4 (L) 07/21/2023 1216   AST 18 07/21/2023 1216   ALT 24 07/21/2023 1216   ALKPHOS 81 07/21/2023 1216   BILITOT 0.3 07/21/2023 1216   GFRNONAA >60 07/21/2023 1216   GFRAA >60 07/17/2019 0322    No results found for: "SPEP", "UPEP"  Lab Results  Component Value Date   WBC 4.2 07/21/2023   NEUTROABS 3.2 07/21/2023   HGB 9.6 (L) 07/21/2023   HCT 31.6 (L) 07/21/2023   MCV 81.7 07/21/2023   PLT 333  07/21/2023      Chemistry      Component Value Date/Time   NA 136 07/21/2023 1216   K 3.9 07/21/2023 1216   CL 101 07/21/2023 1216   CO2 29 07/21/2023 1216   BUN 12  07/21/2023 1216   CREATININE 0.60 (L) 07/21/2023 1216      Component Value Date/Time   CALCIUM 8.4 (L) 07/21/2023 1216   ALKPHOS 81 07/21/2023 1216   AST 18 07/21/2023 1216   ALT 24 07/21/2023 1216   BILITOT 0.3 07/21/2023 1216       RADIOGRAPHIC STUDIES: I have personally reviewed the radiological images as listed and agreed with the findings in the report. MR Cervical Spine W and Wo Contrast Result Date: 07/10/2023 CLINICAL DATA:  Metastatic disease evaluation EXAM: MRI CERVICAL AND THORACIC SPINE WITHOUT AND WITH CONTRAST TECHNIQUE: Multiplanar and multiecho pulse sequences of the cervical spine, to include the craniocervical junction and cervicothoracic junction, and the thoracic spine, were obtained without and with intravenous contrast. CONTRAST:  8mL GADAVIST GADOBUTROL 1 MMOL/ML IV SOLN COMPARISON:  PET CT 05/04/23, MR T Spine 04/11/23 FINDINGS: MRI CERVICAL SPINE FINDINGS Alignment: Physiologic. Vertebrae: T1 hyperintense lesions are present at the C6 and C7 vertebral bodies, worirsome for osseous metastatic disease. T1 hypointense lesions are also present at the left-sided facet at C3. There is abnormal contrast enhancement in the ventral and dorsal epidural space spanning the C5-C7 vertebral body levels (series 7, image 10) Cord: Normal signal and morphology. Posterior Fossa, vertebral arteries, paraspinal tissues: Negative. Disc levels: There is severe spinal canal stenosis of the C5-C6, C6-C7, and C7-T1 levels. MRI THORACIC SPINE FINDINGS Alignment:  Physiologic. Vertebrae: There are multilevel T1 hypointense lesions lesions in the thoracic spine, most notably at T1-T5, T8, T10-T11. kyphoplasty changes at T3. Compared to prior exam there is a new/increased T2 hyperintense fluid collection in the dorsal epidural space  spanning the T3-T6 levels with associated contrast enhancement. While this could represent extraosseous tumor, epidural phlegmon is not excluded. There is also increased contrast enhancement in the ventral epidural space spanning the T2-T5 levels (series 7, image 12). Cord: There is T2 hyperintense cord signal abnormality of the T2-T3 level, which appears slightly more conspicuous than on 03/29/2023 Paraspinal and other soft tissues: Compared to prior exam there is a new peripherally enhancing 3.2 x 2.4 x 2.8 cm T2 hyperintense fluid collection of the laminectomy site (series 7, image 13). This is unlikely represent a postsurgical fluid collection as it was absent on recent prior MRI of the thoracic spine dated 04/11/2023. This is nonspecific, but infection is not excluded. There is a 1.9 cm retrocrural lymph node on the left (series 24, image 33), favored to represent nodal metastatic disease. This is new from PET CT 05/04/23. Disc levels: There is persistent severe spinal canal stenosis of the T2-T3 level (series 24, image 8). There is now moderate spinal canal narrowing of the T4-T5 levels. IMPRESSION: 1. Redemonstrated multiple osseous metastases spanning the C5 to the L1 vertebral bodies, as above. 2. New/increased T2 hyperintense contrast-enhancing material in the dorsal epidural space spanning the C5-T6 levels. While this could represent extraosseous tumor, epidural phlegmon is not excluded. 3. Increased contrast enhancement in the ventral epidural space spanning the C5-T5 levels, which could represent progressive epidural tumor, but infection is not excluded. 4. There is now severe spinal canal stenosis C5-T3 levels. 5. New peripherally enhancing 3.2 x 2.4 x 2.8 cm fluid collection at the laminectomy site. This was not present recent prior MRI of the thoracic spine dated 04/11/2023. Although this is nonspecific and a post-surgical fluid collection is a consideration, infection is not excluded. 6. New 1.8 cm  retrocrural lymph node on the left. Consider further evaluation with a repeat CT chest abdomen/pelvis to better characterize  the extent of metastatic disease. Electronically Signed   By: Lorenza Cambridge M.D.   On: 07/10/2023 14:20   MR THORACIC SPINE W WO CONTRAST Result Date: 07/10/2023 CLINICAL DATA:  Metastatic disease evaluation EXAM: MRI CERVICAL AND THORACIC SPINE WITHOUT AND WITH CONTRAST TECHNIQUE: Multiplanar and multiecho pulse sequences of the cervical spine, to include the craniocervical junction and cervicothoracic junction, and the thoracic spine, were obtained without and with intravenous contrast. CONTRAST:  8mL GADAVIST GADOBUTROL 1 MMOL/ML IV SOLN COMPARISON:  PET CT 05/04/23, MR T Spine 04/11/23 FINDINGS: MRI CERVICAL SPINE FINDINGS Alignment: Physiologic. Vertebrae: T1 hyperintense lesions are present at the C6 and C7 vertebral bodies, worirsome for osseous metastatic disease. T1 hypointense lesions are also present at the left-sided facet at C3. There is abnormal contrast enhancement in the ventral and dorsal epidural space spanning the C5-C7 vertebral body levels (series 7, image 10) Cord: Normal signal and morphology. Posterior Fossa, vertebral arteries, paraspinal tissues: Negative. Disc levels: There is severe spinal canal stenosis of the C5-C6, C6-C7, and C7-T1 levels. MRI THORACIC SPINE FINDINGS Alignment:  Physiologic. Vertebrae: There are multilevel T1 hypointense lesions lesions in the thoracic spine, most notably at T1-T5, T8, T10-T11. kyphoplasty changes at T3. Compared to prior exam there is a new/increased T2 hyperintense fluid collection in the dorsal epidural space spanning the T3-T6 levels with associated contrast enhancement. While this could represent extraosseous tumor, epidural phlegmon is not excluded. There is also increased contrast enhancement in the ventral epidural space spanning the T2-T5 levels (series 7, image 12). Cord: There is T2 hyperintense cord signal  abnormality of the T2-T3 level, which appears slightly more conspicuous than on 03/29/2023 Paraspinal and other soft tissues: Compared to prior exam there is a new peripherally enhancing 3.2 x 2.4 x 2.8 cm T2 hyperintense fluid collection of the laminectomy site (series 7, image 13). This is unlikely represent a postsurgical fluid collection as it was absent on recent prior MRI of the thoracic spine dated 04/11/2023. This is nonspecific, but infection is not excluded. There is a 1.9 cm retrocrural lymph node on the left (series 24, image 33), favored to represent nodal metastatic disease. This is new from PET CT 05/04/23. Disc levels: There is persistent severe spinal canal stenosis of the T2-T3 level (series 24, image 8). There is now moderate spinal canal narrowing of the T4-T5 levels. IMPRESSION: 1. Redemonstrated multiple osseous metastases spanning the C5 to the L1 vertebral bodies, as above. 2. New/increased T2 hyperintense contrast-enhancing material in the dorsal epidural space spanning the C5-T6 levels. While this could represent extraosseous tumor, epidural phlegmon is not excluded. 3. Increased contrast enhancement in the ventral epidural space spanning the C5-T5 levels, which could represent progressive epidural tumor, but infection is not excluded. 4. There is now severe spinal canal stenosis C5-T3 levels. 5. New peripherally enhancing 3.2 x 2.4 x 2.8 cm fluid collection at the laminectomy site. This was not present recent prior MRI of the thoracic spine dated 04/11/2023. Although this is nonspecific and a post-surgical fluid collection is a consideration, infection is not excluded. 6. New 1.8 cm retrocrural lymph node on the left. Consider further evaluation with a repeat CT chest abdomen/pelvis to better characterize the extent of metastatic disease. Electronically Signed   By: Lorenza Cambridge M.D.   On: 07/10/2023 14:20   NM PLUVICTO ADMINISTRATION Result Date: 07/05/2023 CLINICAL DATA:  Castrate  resistant prostate adenocarcinoma. Trial of taxing chemotherapy with adverse effects. PSMA avid skeletal metastasis identified on is made PET scan 05/05/2023. EXAM:  NUCLEAR MEDICINE PLUVICTO INJECTION TECHNIQUE: Infusion: The nuclear medicine technologist and I personally verified the dose activity to be delivered as specified in the written directive, and verified the patient identification via 2 separate methods. Initial flush of the intravenous catheter was performed was sterile saline. The dose syringe was connected to the catheter and the Lu-177 Pluvicto administered over a 1 to 10 min infusion. Single 10 cc lushes with normal saline follow the dose. No complications were noted. The entire IV tubing, venocatheter, stopcock and syringes was removed in total, placed in a disposal bag and sent for assay of the residual activity, which will be reported at a later time in our EMR by the physics staff. Pressure was applied to the venipuncture site, and a compression bandage placed. Patient monitored for 1 hour following infusion. Radiation Safety personnel were present to perform the discharge survey, as detailed on their documentation. After a short period of observation, the patient had his IV removed. RADIOPHARMACEUTICALS:  Two hundred three microcuries Lu-177 PLUVICTO FINDINGS: Current Infusion: 1 Planned Infusions: 6 Patient presented to nuclear medicine for treatment. The patient's most recent blood counts were reviewed and remains a good candidate to proceed with Lu-177 Pluvicto. Patient chronically anemic with hemoglobin equal 8.3. White blood cell count improved to 3.21.4. Normal renal function liver function. PSA equal 34.9 The patient was situated in an infusion suite with a contact barrier placed under the arm. Intravenous access was established, using sterile technique, and a normal saline infusion from a syringe was started. Micro-dosimetry: The prescribed radiation activity was assayed and confirmed to  be within specified tolerance. IMPRESSION: Current Infusion: 1 Planned Infusions: 6 The patient tolerated the infusion well. The patient will return in 6 weeks for ongoing care. Electronically Signed   By: Genevive Bi M.D.   On: 07/05/2023 14:48

## 2023-07-20 NOTE — Assessment & Plan Note (Signed)
 Will give two doses of feraheme

## 2023-07-20 NOTE — Assessment & Plan Note (Signed)
 Continue with Pluvicto  Monitor PSA  Monitor CBC Last lupron  was on 10/16 at 30 mg. Will resume in Feb Last zometa  on 10/16. Will resume in January

## 2023-07-21 ENCOUNTER — Inpatient Hospital Stay: Payer: Medicare Other | Attending: Oncology

## 2023-07-21 ENCOUNTER — Ambulatory Visit
Admission: RE | Admit: 2023-07-21 | Discharge: 2023-07-21 | Disposition: A | Discharge status: Home / Self Care | Payer: Medicare Other | Source: Ambulatory Visit | Attending: Radiation Oncology | Admitting: Radiation Oncology

## 2023-07-21 ENCOUNTER — Encounter: Payer: Self-pay | Admitting: Nurse Practitioner

## 2023-07-21 ENCOUNTER — Other Ambulatory Visit: Payer: Self-pay

## 2023-07-21 ENCOUNTER — Inpatient Hospital Stay (HOSPITAL_BASED_OUTPATIENT_CLINIC_OR_DEPARTMENT_OTHER): Payer: Medicare Other | Admitting: Nurse Practitioner

## 2023-07-21 ENCOUNTER — Inpatient Hospital Stay (HOSPITAL_BASED_OUTPATIENT_CLINIC_OR_DEPARTMENT_OTHER): Payer: Medicare Other

## 2023-07-21 ENCOUNTER — Inpatient Hospital Stay: Payer: Medicare Other

## 2023-07-21 VITALS — BP 99/74 | HR 76 | Temp 97.2°F | Wt 168.8 lb

## 2023-07-21 DIAGNOSIS — E538 Deficiency of other specified B group vitamins: Secondary | ICD-10-CM

## 2023-07-21 DIAGNOSIS — C61 Malignant neoplasm of prostate: Secondary | ICD-10-CM

## 2023-07-21 DIAGNOSIS — M79602 Pain in left arm: Secondary | ICD-10-CM | POA: Insufficient documentation

## 2023-07-21 DIAGNOSIS — G893 Neoplasm related pain (acute) (chronic): Secondary | ICD-10-CM | POA: Insufficient documentation

## 2023-07-21 DIAGNOSIS — Z515 Encounter for palliative care: Secondary | ICD-10-CM

## 2023-07-21 DIAGNOSIS — R530 Neoplastic (malignant) related fatigue: Secondary | ICD-10-CM

## 2023-07-21 DIAGNOSIS — G9520 Unspecified cord compression: Secondary | ICD-10-CM

## 2023-07-21 DIAGNOSIS — D509 Iron deficiency anemia, unspecified: Secondary | ICD-10-CM

## 2023-07-21 DIAGNOSIS — K219 Gastro-esophageal reflux disease without esophagitis: Secondary | ICD-10-CM | POA: Insufficient documentation

## 2023-07-21 DIAGNOSIS — C7951 Secondary malignant neoplasm of bone: Secondary | ICD-10-CM | POA: Insufficient documentation

## 2023-07-21 DIAGNOSIS — G629 Polyneuropathy, unspecified: Secondary | ICD-10-CM

## 2023-07-21 LAB — CMP (CANCER CENTER ONLY)
ALT: 24 U/L (ref 0–44)
AST: 18 U/L (ref 15–41)
Albumin: 3.4 g/dL — ABNORMAL LOW (ref 3.5–5.0)
Alkaline Phosphatase: 81 U/L (ref 38–126)
Anion gap: 6 (ref 5–15)
BUN: 12 mg/dL (ref 8–23)
CO2: 29 mmol/L (ref 22–32)
Calcium: 8.4 mg/dL — ABNORMAL LOW (ref 8.9–10.3)
Chloride: 101 mmol/L (ref 98–111)
Creatinine: 0.6 mg/dL — ABNORMAL LOW (ref 0.61–1.24)
GFR, Estimated: >60 mL/min (ref >60)
Glucose, Bld: 109 mg/dL — ABNORMAL HIGH (ref 70–99)
Potassium: 3.9 mmol/L (ref 3.5–5.1)
Sodium: 136 mmol/L (ref 135–145)
Total Bilirubin: 0.3 mg/dL (ref 0.0–1.2)
Total Protein: 6.2 g/dL — ABNORMAL LOW (ref 6.5–8.1)

## 2023-07-21 LAB — RAD ONC ARIA SESSION SUMMARY
Course Elapsed Days: 10
Plan Fractions Treated to Date: 8
Plan Prescribed Dose Per Fraction: 3 Gy
Plan Total Fractions Prescribed: 10
Plan Total Prescribed Dose: 30 Gy
Reference Point Dosage Given to Date: 24 Gy
Reference Point Session Dosage Given: 3 Gy
Session Number: 8

## 2023-07-21 LAB — CBC WITH DIFFERENTIAL (CANCER CENTER ONLY)
Abs Immature Granulocytes: 0.03 10*3/uL (ref 0.00–0.07)
Basophils Absolute: 0 10*3/uL (ref 0.0–0.1)
Basophils Relative: 1 %
Eosinophils Absolute: 0.1 10*3/uL (ref 0.0–0.5)
Eosinophils Relative: 2 %
HCT: 31.6 % — ABNORMAL LOW (ref 39.0–52.0)
Hemoglobin: 9.6 g/dL — ABNORMAL LOW (ref 13.0–17.0)
Immature Granulocytes: 1 %
Lymphocytes Relative: 7 %
Lymphs Abs: 0.3 10*3/uL — ABNORMAL LOW (ref 0.7–4.0)
MCH: 24.8 pg — ABNORMAL LOW (ref 26.0–34.0)
MCHC: 30.4 g/dL (ref 30.0–36.0)
MCV: 81.7 fL (ref 80.0–100.0)
Monocytes Absolute: 0.6 10*3/uL (ref 0.1–1.0)
Monocytes Relative: 14 %
Neutro Abs: 3.2 10*3/uL (ref 1.7–7.7)
Neutrophils Relative %: 75 %
Platelet Count: 333 10*3/uL (ref 150–400)
RBC: 3.87 MIL/uL — ABNORMAL LOW (ref 4.22–5.81)
RDW: 19.9 % — ABNORMAL HIGH (ref 11.5–15.5)
WBC Count: 4.2 10*3/uL (ref 4.0–10.5)
nRBC: 0 % (ref 0.0–0.2)

## 2023-07-21 LAB — LACTATE DEHYDROGENASE: LDH: 414 U/L — ABNORMAL HIGH (ref 98–192)

## 2023-07-21 MED ORDER — ZOLEDRONIC ACID 4 MG/100ML IV SOLN
4.0000 mg | Freq: Once | INTRAVENOUS | Status: AC
Start: 2023-07-21 — End: 2023-07-21
  Administered 2023-07-21: 4 mg via INTRAVENOUS
  Filled 2023-07-21: qty 100

## 2023-07-21 MED ORDER — GABAPENTIN 300 MG PO CAPS: 900.0000 mg | ORAL_CAPSULE | Freq: Two times a day (BID) | ORAL | 2 refills | Status: DC

## 2023-07-21 MED ORDER — FENTANYL 50 MCG/HR TD PT72: 1.0000 | MEDICATED_PATCH | TRANSDERMAL | 0 refills | Status: DC

## 2023-07-21 MED ORDER — TRAZODONE HCL 100 MG PO TABS: 100.0000 mg | ORAL_TABLET | Freq: Every day | ORAL | 1 refills | Status: DC

## 2023-07-21 MED ORDER — SODIUM CHLORIDE 0.9 % IV SOLN: Freq: Once | INTRAVENOUS | Status: AC

## 2023-07-21 MED ORDER — SODIUM CHLORIDE 0.9% FLUSH: 10.0000 mL | Freq: Two times a day (BID) | INTRAVENOUS | Status: DC

## 2023-07-21 MED ORDER — DEXAMETHASONE 2 MG PO TABS: ORAL_TABLET | ORAL | 0 refills | Status: DC

## 2023-07-21 MED ORDER — OXYCODONE HCL 5 MG PO TABS: 5.0000 mg | ORAL_TABLET | ORAL | 0 refills | Status: DC | PRN

## 2023-07-21 NOTE — Patient Instructions (Signed)

## 2023-07-21 NOTE — Assessment & Plan Note (Addendum)
gabapentin 900 mg 2 times daily which he has been taking and tolerating well. Refilled today Fentanyl patch 50 mcg. Refilled today Dexamethasone 2 mg twice daily for 3 weeks, then 2 mg daily. Refilled today

## 2023-07-22 ENCOUNTER — Ambulatory Visit
Admission: RE | Admit: 2023-07-22 | Discharge: 2023-07-22 | Disposition: A | Discharge status: Home / Self Care | Payer: Medicare Other | Source: Ambulatory Visit | Attending: Radiation Oncology

## 2023-07-22 ENCOUNTER — Other Ambulatory Visit: Payer: Self-pay

## 2023-07-22 ENCOUNTER — Ambulatory Visit
Admission: RE | Admit: 2023-07-22 | Discharge: 2023-07-22 | Disposition: A | Discharge status: Home / Self Care | Payer: Medicare Other | Source: Ambulatory Visit | Attending: Radiation Oncology | Admitting: Radiation Oncology

## 2023-07-22 DIAGNOSIS — C61 Malignant neoplasm of prostate: Secondary | ICD-10-CM | POA: Diagnosis not present

## 2023-07-22 LAB — RAD ONC ARIA SESSION SUMMARY
Course Elapsed Days: 11
Plan Fractions Treated to Date: 9
Plan Prescribed Dose Per Fraction: 3 Gy
Plan Total Fractions Prescribed: 10
Plan Total Prescribed Dose: 30 Gy
Reference Point Dosage Given to Date: 27 Gy
Reference Point Session Dosage Given: 3 Gy
Session Number: 9

## 2023-07-22 LAB — TESTOSTERONE: Testosterone: <3 ng/dL — ABNORMAL LOW (ref 264–916)

## 2023-07-22 LAB — PSA, TOTAL AND FREE
PSA, Free Pct: 12.5 %
PSA, Free: 3.44 ng/mL
Prostate Specific Ag, Serum: 27.5 ng/mL — ABNORMAL HIGH (ref 0.0–4.0)

## 2023-07-25 ENCOUNTER — Telehealth: Payer: Self-pay

## 2023-07-25 ENCOUNTER — Other Ambulatory Visit: Payer: Self-pay

## 2023-07-25 ENCOUNTER — Telehealth: Payer: Self-pay | Admitting: Nutrition

## 2023-07-25 ENCOUNTER — Ambulatory Visit
Admission: RE | Admit: 2023-07-25 | Discharge: 2023-07-25 | Disposition: A | Discharge status: Home / Self Care | Payer: Medicare Other | Source: Ambulatory Visit | Attending: Radiation Oncology | Admitting: Radiation Oncology

## 2023-07-25 DIAGNOSIS — C61 Malignant neoplasm of prostate: Secondary | ICD-10-CM

## 2023-07-25 DIAGNOSIS — C7949 Secondary malignant neoplasm of other parts of nervous system: Secondary | ICD-10-CM

## 2023-07-25 LAB — RAD ONC ARIA SESSION SUMMARY
Course Elapsed Days: 14
Plan Fractions Treated to Date: 10
Plan Prescribed Dose Per Fraction: 3 Gy
Plan Total Fractions Prescribed: 10
Plan Total Prescribed Dose: 30 Gy
Reference Point Dosage Given to Date: 30 Gy
Reference Point Session Dosage Given: 3 Gy
Session Number: 10

## 2023-07-25 NOTE — Telephone Encounter (Signed)
Scheduled appointment per scheduling message. Patient was made aware of the appointment via voicemail. Patient is active on MyChart and will be mailed an appointment reminder.

## 2023-07-25 NOTE — Telephone Encounter (Signed)
-----   Message from Melven Sartorius sent at 07/22/2023 10:07 AM EST ----- Please let him know improvement of PSA. Continue to finish his radiation and continue Pluvicto next month.

## 2023-07-25 NOTE — Telephone Encounter (Signed)
Attempted to call pt x 2 to inform of the below information from Dr. Cherly Hensen. Left message on VM, asking that pt call the office on 07/22/23 and today. No return call.

## 2023-07-26 NOTE — Radiation Completion Notes (Addendum)
  Radiation Oncology         [phone] ________________________________  Name: Nathan Berg MRN: [id number]  Date: 07/25/2023  DOB: 1955-10-22  Referring Physician: Malachy Mood, M.D. Date of Service: 2023-07-26 Radiation Oncologist: Margaretmary Bayley, M.D. 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  He tolerated the radiation treatments well without any ill side effects.  The patient will receive a call in about one month from the radiation oncology department. He will continue follow up with his medical oncologist, Dr. Cherly Hensen, as well.  ------------------------------------------------   Margaretmary Dys, MD [facility]  Radiation Oncology Direct Dial: [phone]  Fax: [phone] [email or web address]  Skype  LinkedIn

## 2023-08-02 ENCOUNTER — Ambulatory Visit (HOSPITAL_COMMUNITY)
Admission: RE | Admit: 2023-08-02 | Discharge: 2023-08-02 | Disposition: A | Discharge status: Home / Self Care | Payer: Medicare Other | Source: Ambulatory Visit | Attending: Radiation Oncology | Admitting: Radiation Oncology

## 2023-08-02 DIAGNOSIS — C7951 Secondary malignant neoplasm of bone: Secondary | ICD-10-CM

## 2023-08-04 ENCOUNTER — Encounter: Payer: Self-pay | Admitting: Nutrition

## 2023-08-04 ENCOUNTER — Inpatient Hospital Stay: Payer: Medicare Other | Admitting: Nutrition

## 2023-08-04 NOTE — Progress Notes (Signed)
Patient did not show up for nutrition appointment.

## 2023-08-05 NOTE — Progress Notes (Deleted)
 Palliative Medicine [facility]  Telephone:(336) [phone] Fax:(336) [phone]   Name: Nathan Berg Date: 08/05/2023 MRN: [id number]  DOB: 06-Mar-1956  Patient Care Team: Lanny Callander, MD as PCP - General (Hematology) Lanny Callander, MD as Attending Physician (Hematology and Oncology) Tanda Prentice DEL, MD (Family Medicine) Pickenpack-Cousar, Fannie SAILOR, NP as Nurse Practitioner [facility] and Palliative Medicine)    INTERVAL HISTORY: Nathan Berg is a 68 y.o. 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 Palliative ask to see for symptom management and goals of care.     SOCIAL HISTORY:    Nathan Berg reports that he has never smoked. He has never used smokeless tobacco. He reports current alcohol use. He reports that he does not use drugs.  ADVANCE DIRECTIVES:  DNR/MOST, HCPOA, Living Will on file  CODE STATUS: DNR  PAST MEDICAL HISTORY: Past Medical History:  Diagnosis Date  . Anxiety   . Arthritis   . Cancer [facility])    Prostate cancer  . Depression   . GERD (gastroesophageal reflux disease)   . H/O seasonal allergies   . History of kidney stones    x1  . Prostate cancer (HCC)     ALLERGIES:  is allergic to codeine and penicillin g.  MEDICATIONS:  Current Outpatient Medications  Medication Sig Dispense Refill  . ALPRAZolam  (XANAX ) 1 MG tablet TAKE 1/2 TO 1 TABLET BY MOUTH TWICE DAILY AS NEEDED FOR ANXIETY. MUST HAVE OFFICE OR TELEMEDICINE VISIT FOR REFILLS    . cyanocobalamin  1000 MCG tablet Take 1 tablet (1,000 mcg total) by mouth daily. 30 tablet 0  . dexamethasone  (DECADRON ) 2 MG tablet Take twice daily for 3 weeks and take the afternoon dose earlier at 4 pm to have better sleeps not at bedtime. Decrease to 2 mg daily after 3 weeks until next visit. 120 tablet 0  . docusate sodium  (COLACE) 100 MG capsule Take 1  capsule (100 mg total) by mouth 2 (two) times daily. (Patient taking differently: Take 100 mg by mouth daily.) 60 capsule 2  . escitalopram  (LEXAPRO ) 20 MG tablet Take 1.5 tablets (30 mg total) by mouth daily. 45 tablet 0  . fentaNYL  (DURAGESIC ) 50 MCG/HR Place 1 patch onto the skin every 3 (three) days. 10 patch 0  . folic acid  (FOLVITE ) 1 MG tablet Take 1 tablet (1 mg total) by mouth daily. 30 tablet 2  . gabapentin  (NEURONTIN ) 300 MG capsule Take 3 capsules (900 mg total) by mouth 2 (two) times daily. 270 capsule 2  . ibuprofen  (ADVIL ) 200 MG tablet Take 800 mg by mouth as needed for mild pain or moderate pain.    . magnesium  oxide (MAG-OX) 400 (240 Mg) MG tablet Take 400 mg by mouth daily.    . melatonin 3 MG TABS tablet Take 1 tablet (3 mg total) by mouth at bedtime as needed (insomnia).    . methocarbamol  (ROBAXIN ) 750 MG tablet Take 1 tablet (750 mg total) by mouth every 8 (eight) hours as needed for muscle spasms. 30 tablet 0  . oxyCODONE  (OXY IR/ROXICODONE ) 5 MG immediate release tablet Take 1-2 tablets (5-10 mg total) by mouth every 4 (four) hours as needed for severe pain (pain score 7-10) or breakthrough pain. 90 tablet 0  . pantoprazole  (PROTONIX ) 40 MG tablet Take 1 tablet (40 mg total) by mouth 2 (two) times daily before a meal. 60  tablet 1  . polyethylene glycol powder (MIRALAX ) 17 GM/SCOOP powder Take 17 g by mouth daily as needed. 238 g 0  . senna (SENOKOT) 8.6 MG TABS tablet Take 2 tablets (17.2 mg total) by mouth at bedtime. Hold for loose stools or diarrhea 120 tablet 0  . traZODone  (DESYREL ) 100 MG tablet Take 1 tablet (100 mg total) by mouth at bedtime. 30 tablet 1   No current facility-administered medications for this visit.    VITAL SIGNS: There were no vitals taken for this visit. There were no vitals filed for this visit.  Estimated body mass index is 24.22 kg/m as calculated from the following:   Height as of 07/10/23: 5' 10 (1.778 m).   Weight as of 07/21/23: 168  lb 12.8 oz (76.6 kg).   PERFORMANCE STATUS (ECOG) : 1 - Symptomatic but completely ambulatory   Physical Exam General: NAD Cardiovascular: regular rate and rhythm Pulmonary: regular, even, unlabored Abdomen: soft, non-tender Extremities: right thumb with mild edema and tenderness Psychological: A/O x 4, restricted affect  IMPRESSION:  Nathan Berg presents to clinic for follow-up s/p recent hospitalization. It has been several months since previous encounter as patient without palliative needs until most recently with uncontrolled pain. No family present. He is alert and able to appropriately engage in discussions. Denies nausea, vomiting, or diarrhea. Is managing his constipation with daily use of Miralax  and Senna. Appetite is fair. Some days are better than others.   Nathan Berg is complaining of persistent and severe chest wall and arm pain, particularly at night. The pain is localized under the left arm and has been consistent. The pain is so severe that it often leads to moaning and groaning for hours when attempting to sleep at night. He has found some relief by delaying bedtime until about 1 AM allowing for medication use.   The patient confirms current regimen consist of gabapentin , Advil , Robaxin  and oxycodone  for pain management, as well as a fentanyl  patch. His spouse manages the medication. Despite this regimen, Nathan Berg reports that the pain is often at a level of 10 on a scale of 0 to 10. The pain may decrease to around 8.5 or 9 when lying in bed, and possibly to a 5 when the patient is able to fall asleep.  Nathan Berg reports that he is unsure whether the fentanyl  patch is effective but continues to use it out of concern that the pain might be worse without it. After extensive discussions he is not taking his oxycodone  as often as he could for his severe pain. Education provided on use of 1-2 tablets every 4 hours as needed for severe pain. He understands we will plan to continue  close monitoring and further adjust regimen as needed. If no improvement will plan to increase Fentanyl  patch at follow-up.   The patient is currently undergoing radiation treatment, with only a few doses remaining. The patient hopes that the completion of this treatment will provide some relief from the pain.  All questions answered and support provided.  Goals of Care: We continued discussion regarding Nathan Berg's current illness and what it means in the larger context of his on-going co-morbidities. Natural disease trajectory and expectations were discussed.   Nathan Berg is clear in his expressed wishes to continue to treat the treatable. He is taking life one day at a time.   Nathan Berg completed HCPOA and Living Will documents at ACP clinic earlier this week.  We discussed the importance of continued conversation  with family and their medical providers regarding overall plan of care and treatment options, ensuring decisions are within the context of the patients values and GOCs.   Assessment and Plan  Cancer-related Pain Severe pain (10/10) in the chest and arm, not well controlled with current regimen (Fentanyl  patch, Gabapentin , Advil , Oxycodone , Robaxin ). Patient reports some relief with sleep and medication, but still experiences significant pain. -Increase Oxycodone  to 2 tablets every 4 hours as needed for pain. -Consider adjusting Fentanyl  patch dosage if no improvement with increased Oxycodone . -Continue Gabapentin  twice daily, Robaxin , and Advil  as prescribed. -Check in on 07/25/2023 to assess pain control and make further adjustments if necessary.  Bone Health Receiving Zometa  infusions for bone health related to cancer treatment. -Continue Zometa  infusions as scheduled.  Mental Health Taking Lexapro  and Xanax  for anxiety/depression. -Continue Lexapro  and Xanax  as prescribed.  Follow-up Next appointment scheduled for 08/10/2023. If pain is not controlled or worsens,  patient to contact provider sooner.  Patient expressed understanding and was in agreement with this plan. He also understands that He can call the clinic at any time with any questions, concerns, or complaints.   Any controlled substances utilized were prescribed in the context of palliative care. PDMP has been reviewed.   Nathan Berg, AGPCNP-BC  Palliative Medicine Team/[facility]

## 2023-08-08 ENCOUNTER — Other Ambulatory Visit: Payer: Self-pay

## 2023-08-08 ENCOUNTER — Observation Stay (HOSPITAL_COMMUNITY)
Admission: EM | Admit: 2023-08-08 | Discharge: 2023-08-10 | Disposition: A | Discharge status: Home / Self Care | Payer: Medicare Other | Attending: Internal Medicine | Admitting: Internal Medicine

## 2023-08-08 ENCOUNTER — Observation Stay (HOSPITAL_COMMUNITY): Payer: Medicare Other

## 2023-08-08 ENCOUNTER — Ambulatory Visit: Payer: Medicare Other | Admitting: Urology

## 2023-08-08 ENCOUNTER — Emergency Department (HOSPITAL_COMMUNITY): Payer: Medicare Other

## 2023-08-08 DIAGNOSIS — Z79899 Other long term (current) drug therapy: Secondary | ICD-10-CM | POA: Insufficient documentation

## 2023-08-08 DIAGNOSIS — I63531 Cerebral infarction due to unspecified occlusion or stenosis of right posterior cerebral artery: Principal | ICD-10-CM | POA: Insufficient documentation

## 2023-08-08 DIAGNOSIS — C7949 Secondary malignant neoplasm of other parts of nervous system: Secondary | ICD-10-CM | POA: Diagnosis not present

## 2023-08-08 DIAGNOSIS — I639 Cerebral infarction, unspecified: Secondary | ICD-10-CM | POA: Diagnosis not present

## 2023-08-08 DIAGNOSIS — R0902 Hypoxemia: Secondary | ICD-10-CM | POA: Insufficient documentation

## 2023-08-08 DIAGNOSIS — F411 Generalized anxiety disorder: Secondary | ICD-10-CM | POA: Insufficient documentation

## 2023-08-08 DIAGNOSIS — R41841 Cognitive communication deficit: Secondary | ICD-10-CM | POA: Diagnosis not present

## 2023-08-08 DIAGNOSIS — D649 Anemia, unspecified: Secondary | ICD-10-CM | POA: Insufficient documentation

## 2023-08-08 DIAGNOSIS — G9340 Encephalopathy, unspecified: Secondary | ICD-10-CM | POA: Insufficient documentation

## 2023-08-08 DIAGNOSIS — F1090 Alcohol use, unspecified, uncomplicated: Secondary | ICD-10-CM | POA: Diagnosis not present

## 2023-08-08 DIAGNOSIS — G9529 Other cord compression: Secondary | ICD-10-CM | POA: Diagnosis not present

## 2023-08-08 DIAGNOSIS — G9520 Unspecified cord compression: Secondary | ICD-10-CM | POA: Diagnosis present

## 2023-08-08 DIAGNOSIS — I350 Nonrheumatic aortic (valve) stenosis: Secondary | ICD-10-CM | POA: Diagnosis not present

## 2023-08-08 DIAGNOSIS — Z7901 Long term (current) use of anticoagulants: Secondary | ICD-10-CM | POA: Diagnosis not present

## 2023-08-08 DIAGNOSIS — R410 Disorientation, unspecified: Secondary | ICD-10-CM | POA: Diagnosis present

## 2023-08-08 DIAGNOSIS — C61 Malignant neoplasm of prostate: Secondary | ICD-10-CM | POA: Insufficient documentation

## 2023-08-08 DIAGNOSIS — G893 Neoplasm related pain (acute) (chronic): Secondary | ICD-10-CM | POA: Insufficient documentation

## 2023-08-08 DIAGNOSIS — C7951 Secondary malignant neoplasm of bone: Secondary | ICD-10-CM | POA: Insufficient documentation

## 2023-08-08 DIAGNOSIS — I6389 Other cerebral infarction: Principal | ICD-10-CM

## 2023-08-08 DIAGNOSIS — F32A Depression, unspecified: Secondary | ICD-10-CM | POA: Diagnosis not present

## 2023-08-08 DIAGNOSIS — E876 Hypokalemia: Secondary | ICD-10-CM | POA: Insufficient documentation

## 2023-08-08 DIAGNOSIS — I63411 Cerebral infarction due to embolism of right middle cerebral artery: Secondary | ICD-10-CM

## 2023-08-08 LAB — URINALYSIS, ROUTINE W REFLEX MICROSCOPIC
Bacteria, UA: NONE SEEN
Bilirubin Urine: NEGATIVE
Glucose, UA: NEGATIVE mg/dL
Hgb urine dipstick: NEGATIVE
Ketones, ur: 20 mg/dL — AB
Leukocytes,Ua: NEGATIVE
Nitrite: NEGATIVE
Protein, ur: 30 mg/dL — AB
Specific Gravity, Urine: 1.032 — ABNORMAL HIGH (ref 1.005–1.030)
pH: 6 (ref 5.0–8.0)

## 2023-08-08 LAB — COMPREHENSIVE METABOLIC PANEL
ALT: 22 U/L (ref 0–44)
AST: 20 U/L (ref 15–41)
Albumin: 2.8 g/dL — ABNORMAL LOW (ref 3.5–5.0)
Alkaline Phosphatase: 69 U/L (ref 38–126)
Anion gap: 10 (ref 5–15)
BUN: 11 mg/dL (ref 8–23)
CO2: 27 mmol/L (ref 22–32)
Calcium: 8.2 mg/dL — ABNORMAL LOW (ref 8.9–10.3)
Chloride: 103 mmol/L (ref 98–111)
Creatinine, Ser: 0.6 mg/dL — ABNORMAL LOW (ref 0.61–1.24)
GFR, Estimated: >60 mL/min (ref >60)
Glucose, Bld: 100 mg/dL — ABNORMAL HIGH (ref 70–99)
Potassium: 3.3 mmol/L — ABNORMAL LOW (ref 3.5–5.1)
Sodium: 140 mmol/L (ref 135–145)
Total Bilirubin: 0.4 mg/dL (ref 0.0–1.2)
Total Protein: 5.8 g/dL — ABNORMAL LOW (ref 6.5–8.1)

## 2023-08-08 LAB — CBC WITH DIFFERENTIAL/PLATELET
Abs Immature Granulocytes: 0.01 10*3/uL (ref 0.00–0.07)
Basophils Absolute: 0 10*3/uL (ref 0.0–0.1)
Basophils Relative: 0 %
Eosinophils Absolute: 0.1 10*3/uL (ref 0.0–0.5)
Eosinophils Relative: 4 %
HCT: 32.4 % — ABNORMAL LOW (ref 39.0–52.0)
Hemoglobin: 9.7 g/dL — ABNORMAL LOW (ref 13.0–17.0)
Immature Granulocytes: 0 %
Lymphocytes Relative: 18 %
Lymphs Abs: 0.5 10*3/uL — ABNORMAL LOW (ref 0.7–4.0)
MCH: 24.8 pg — ABNORMAL LOW (ref 26.0–34.0)
MCHC: 29.9 g/dL — ABNORMAL LOW (ref 30.0–36.0)
MCV: 82.9 fL (ref 80.0–100.0)
Monocytes Absolute: 0.3 10*3/uL (ref 0.1–1.0)
Monocytes Relative: 11 %
Neutro Abs: 1.7 10*3/uL (ref 1.7–7.7)
Neutrophils Relative %: 67 %
Platelets: 229 10*3/uL (ref 150–400)
RBC: 3.91 MIL/uL — ABNORMAL LOW (ref 4.22–5.81)
RDW: 19.9 % — ABNORMAL HIGH (ref 11.5–15.5)
WBC: 2.6 10*3/uL — ABNORMAL LOW (ref 4.0–10.5)
nRBC: 0 % (ref 0.0–0.2)

## 2023-08-08 MED ORDER — TRAZODONE HCL 100 MG PO TABS
100.0000 mg | ORAL_TABLET | Freq: Every day | ORAL | Status: DC
  Administered 2023-08-09 (×2): 100 mg via ORAL
  Filled 2023-08-08 (×2): qty 1

## 2023-08-08 MED ORDER — IBUPROFEN 200 MG PO TABS: 800.0000 mg | ORAL_TABLET | ORAL | Status: DC | PRN

## 2023-08-08 MED ORDER — STROKE: EARLY STAGES OF RECOVERY BOOK
Freq: Once | Status: AC
  Filled 2023-08-08 (×2): qty 1

## 2023-08-08 MED ORDER — MELATONIN 3 MG PO TABS
3.0000 mg | ORAL_TABLET | Freq: Every evening | ORAL | Status: DC | PRN
  Administered 2023-08-09: 3 mg via ORAL
  Filled 2023-08-08: qty 1

## 2023-08-08 MED ORDER — ASPIRIN 81 MG PO CHEW
324.0000 mg | CHEWABLE_TABLET | Freq: Once | ORAL | Status: AC
  Administered 2023-08-08: 324 mg via ORAL
  Filled 2023-08-08: qty 4

## 2023-08-08 MED ORDER — SENNA 8.6 MG PO TABS
2.0000 | ORAL_TABLET | Freq: Every day | ORAL | Status: DC
Start: 2023-08-08 — End: 2023-08-10
  Administered 2023-08-09 (×2): 17.2 mg via ORAL
  Filled 2023-08-08 (×2): qty 2

## 2023-08-08 MED ORDER — HYDROMORPHONE HCL 1 MG/ML IJ SOLN
1.0000 mg | Freq: Once | INTRAMUSCULAR | Status: AC
  Administered 2023-08-08: 1 mg via INTRAVENOUS
  Filled 2023-08-08: qty 1

## 2023-08-08 MED ORDER — FOLIC ACID 1 MG PO TABS
1.0000 mg | ORAL_TABLET | Freq: Every day | ORAL | Status: DC
  Administered 2023-08-09 – 2023-08-10 (×2): 1 mg via ORAL
  Filled 2023-08-08 (×2): qty 1

## 2023-08-08 MED ORDER — ESCITALOPRAM OXALATE 10 MG PO TABS
30.0000 mg | ORAL_TABLET | Freq: Every day | ORAL | Status: DC
Start: 2023-08-09 — End: 2023-08-09

## 2023-08-08 MED ORDER — GADOBUTROL 1 MMOL/ML IV SOLN
8.0000 mL | Freq: Once | INTRAVENOUS | Status: AC | PRN
  Administered 2023-08-08: 8 mL via INTRAVENOUS

## 2023-08-08 MED ORDER — FENTANYL 50 MCG/HR TD PT72: 1.0000 | MEDICATED_PATCH | TRANSDERMAL | Status: DC

## 2023-08-08 MED ORDER — ENOXAPARIN SODIUM 40 MG/0.4ML IJ SOSY
40.0000 mg | PREFILLED_SYRINGE | INTRAMUSCULAR | Status: DC
  Administered 2023-08-09 – 2023-08-10 (×2): 40 mg via SUBCUTANEOUS
  Filled 2023-08-08 (×2): qty 0.4

## 2023-08-08 MED ORDER — ACETAMINOPHEN 160 MG/5ML PO SOLN: 650.0000 mg | ORAL | Status: DC | PRN

## 2023-08-08 MED ORDER — ALPRAZOLAM 0.5 MG PO TABS: 0.5000 mg | ORAL_TABLET | Freq: Two times a day (BID) | ORAL | Status: DC | PRN

## 2023-08-08 MED ORDER — SENNOSIDES-DOCUSATE SODIUM 8.6-50 MG PO TABS: 1.0000 | ORAL_TABLET | Freq: Every evening | ORAL | Status: DC | PRN

## 2023-08-08 MED ORDER — IOHEXOL 350 MG/ML SOLN
100.0000 mL | Freq: Once | INTRAVENOUS | Status: AC | PRN
  Administered 2023-08-08: 100 mL via INTRAVENOUS

## 2023-08-08 MED ORDER — GABAPENTIN 300 MG PO CAPS
900.0000 mg | ORAL_CAPSULE | Freq: Two times a day (BID) | ORAL | Status: DC
  Administered 2023-08-09 – 2023-08-10 (×4): 900 mg via ORAL
  Filled 2023-08-08 (×4): qty 3

## 2023-08-08 MED ORDER — OXYCODONE HCL 5 MG PO TABS
5.0000 mg | ORAL_TABLET | ORAL | Status: DC | PRN
  Administered 2023-08-09 – 2023-08-10 (×3): 10 mg via ORAL
  Filled 2023-08-08 (×4): qty 2

## 2023-08-08 MED ORDER — ACETAMINOPHEN 650 MG RE SUPP: 650.0000 mg | RECTAL | Status: DC | PRN

## 2023-08-08 MED ORDER — DOCUSATE SODIUM 100 MG PO CAPS
100.0000 mg | ORAL_CAPSULE | Freq: Every day | ORAL | Status: DC
  Administered 2023-08-09 – 2023-08-10 (×2): 100 mg via ORAL
  Filled 2023-08-08 (×2): qty 1

## 2023-08-08 MED ORDER — PANTOPRAZOLE SODIUM 40 MG PO TBEC
40.0000 mg | DELAYED_RELEASE_TABLET | Freq: Two times a day (BID) | ORAL | Status: DC
  Administered 2023-08-09 – 2023-08-10 (×4): 40 mg via ORAL
  Filled 2023-08-08 (×4): qty 1

## 2023-08-08 MED ORDER — POTASSIUM CHLORIDE CRYS ER 20 MEQ PO TBCR
20.0000 meq | EXTENDED_RELEASE_TABLET | Freq: Once | ORAL | Status: AC
  Administered 2023-08-09: 20 meq via ORAL
  Filled 2023-08-08: qty 1

## 2023-08-08 MED ORDER — METHOCARBAMOL 750 MG PO TABS: 750.0000 mg | ORAL_TABLET | Freq: Three times a day (TID) | ORAL | Status: DC | PRN

## 2023-08-08 MED ORDER — ONDANSETRON HCL 4 MG/2ML IJ SOLN: 4.0000 mg | Freq: Four times a day (QID) | INTRAMUSCULAR | Status: DC | PRN

## 2023-08-08 MED ORDER — ACETAMINOPHEN 325 MG PO TABS: 650.0000 mg | ORAL_TABLET | ORAL | Status: DC | PRN
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
## 2023-08-08 NOTE — ED Notes (Signed)
PT refused CT provider aware
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
## 2023-08-08 NOTE — Hospital Course (Signed)
Nathan Berg is a 68 y.o. 

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
## 2023-08-09 ENCOUNTER — Observation Stay (HOSPITAL_BASED_OUTPATIENT_CLINIC_OR_DEPARTMENT_OTHER): Payer: Medicare Other

## 2023-08-09 ENCOUNTER — Observation Stay (HOSPITAL_COMMUNITY): Payer: Medicare Other

## 2023-08-09 ENCOUNTER — Encounter (HOSPITAL_COMMUNITY): Payer: Self-pay | Admitting: Internal Medicine

## 2023-08-09 DIAGNOSIS — Z79899 Other long term (current) drug therapy: Secondary | ICD-10-CM | POA: Diagnosis not present

## 2023-08-09 DIAGNOSIS — F32A Depression, unspecified: Secondary | ICD-10-CM | POA: Diagnosis not present

## 2023-08-09 DIAGNOSIS — I6389 Other cerebral infarction: Secondary | ICD-10-CM | POA: Diagnosis not present

## 2023-08-09 DIAGNOSIS — R41841 Cognitive communication deficit: Secondary | ICD-10-CM | POA: Diagnosis not present

## 2023-08-09 DIAGNOSIS — G893 Neoplasm related pain (acute) (chronic): Secondary | ICD-10-CM | POA: Diagnosis not present

## 2023-08-09 DIAGNOSIS — I63531 Cerebral infarction due to unspecified occlusion or stenosis of right posterior cerebral artery: Secondary | ICD-10-CM | POA: Diagnosis not present

## 2023-08-09 DIAGNOSIS — M7989 Other specified soft tissue disorders: Secondary | ICD-10-CM | POA: Diagnosis not present

## 2023-08-09 DIAGNOSIS — G9340 Encephalopathy, unspecified: Secondary | ICD-10-CM | POA: Diagnosis not present

## 2023-08-09 DIAGNOSIS — F1090 Alcohol use, unspecified, uncomplicated: Secondary | ICD-10-CM | POA: Diagnosis not present

## 2023-08-09 DIAGNOSIS — C61 Malignant neoplasm of prostate: Secondary | ICD-10-CM | POA: Diagnosis not present

## 2023-08-09 DIAGNOSIS — F411 Generalized anxiety disorder: Secondary | ICD-10-CM | POA: Diagnosis not present

## 2023-08-09 DIAGNOSIS — G9529 Other cord compression: Secondary | ICD-10-CM | POA: Diagnosis not present

## 2023-08-09 DIAGNOSIS — C7951 Secondary malignant neoplasm of bone: Secondary | ICD-10-CM | POA: Diagnosis not present

## 2023-08-09 DIAGNOSIS — C7949 Secondary malignant neoplasm of other parts of nervous system: Secondary | ICD-10-CM | POA: Diagnosis not present

## 2023-08-09 DIAGNOSIS — E876 Hypokalemia: Secondary | ICD-10-CM | POA: Diagnosis not present

## 2023-08-09 DIAGNOSIS — I639 Cerebral infarction, unspecified: Secondary | ICD-10-CM | POA: Diagnosis not present

## 2023-08-09 DIAGNOSIS — Z7901 Long term (current) use of anticoagulants: Secondary | ICD-10-CM | POA: Diagnosis not present

## 2023-08-09 DIAGNOSIS — R410 Disorientation, unspecified: Secondary | ICD-10-CM | POA: Diagnosis present

## 2023-08-09 DIAGNOSIS — D649 Anemia, unspecified: Secondary | ICD-10-CM | POA: Diagnosis not present

## 2023-08-09 DIAGNOSIS — I350 Nonrheumatic aortic (valve) stenosis: Secondary | ICD-10-CM | POA: Diagnosis not present

## 2023-08-09 DIAGNOSIS — R0902 Hypoxemia: Secondary | ICD-10-CM | POA: Diagnosis not present

## 2023-08-09 LAB — ECHOCARDIOGRAM COMPLETE
AR max vel: 1.1 cm2
AV Area VTI: 1.19 cm2
AV Area mean vel: 1.21 cm2
AV Mean grad: 30.7 mm[Hg]
AV Peak grad: 47.1 mm[Hg]
Ao pk vel: 3.43 m/s
Area-P 1/2: 3.46 cm2
Height: 70 in
P 1/2 time: 530 ms
S' Lateral: 2.7 cm
Weight: 2716.07 [oz_av]

## 2023-08-09 LAB — CBC
HCT: 31.4 % — ABNORMAL LOW (ref 39.0–52.0)
Hemoglobin: 9.4 g/dL — ABNORMAL LOW (ref 13.0–17.0)
MCH: 24.7 pg — ABNORMAL LOW (ref 26.0–34.0)
MCHC: 29.9 g/dL — ABNORMAL LOW (ref 30.0–36.0)
MCV: 82.6 fL (ref 80.0–100.0)
Platelets: 196 10*3/uL (ref 150–400)
RBC: 3.8 MIL/uL — ABNORMAL LOW (ref 4.22–5.81)
RDW: 19.9 % — ABNORMAL HIGH (ref 11.5–15.5)
WBC: 2.4 10*3/uL — ABNORMAL LOW (ref 4.0–10.5)
nRBC: 0 % (ref 0.0–0.2)

## 2023-08-09 LAB — LIPID PANEL
Cholesterol: 179 mg/dL (ref 0–200)
HDL: 59 mg/dL (ref >40)
LDL Cholesterol: 98 mg/dL (ref 0–99)
Total CHOL/HDL Ratio: 3 {ratio}
Triglycerides: 112 mg/dL (ref <150)
VLDL: 22 mg/dL (ref 0–40)

## 2023-08-09 LAB — HIV ANTIBODY (ROUTINE TESTING W REFLEX): HIV Screen 4th Generation wRfx: NONREACTIVE

## 2023-08-09 LAB — RAPID URINE DRUG SCREEN, HOSP PERFORMED
Amphetamines: NOT DETECTED
Barbiturates: NOT DETECTED
Benzodiazepines: POSITIVE — AB
Cocaine: NOT DETECTED
Opiates: POSITIVE — AB
Tetrahydrocannabinol: POSITIVE — AB

## 2023-08-09 LAB — HEMOGLOBIN A1C
Hgb A1c MFr Bld: 5 % (ref 4.8–5.6)
Mean Plasma Glucose: 96.8 mg/dL

## 2023-08-09 LAB — BASIC METABOLIC PANEL
Anion gap: 8 (ref 5–15)
BUN: 9 mg/dL (ref 8–23)
CO2: 27 mmol/L (ref 22–32)
Calcium: 7.8 mg/dL — ABNORMAL LOW (ref 8.9–10.3)
Chloride: 103 mmol/L (ref 98–111)
Creatinine, Ser: 0.64 mg/dL (ref 0.61–1.24)
GFR, Estimated: >60 mL/min (ref >60)
Glucose, Bld: 102 mg/dL — ABNORMAL HIGH (ref 70–99)
Potassium: 3.2 mmol/L — ABNORMAL LOW (ref 3.5–5.1)
Sodium: 138 mmol/L (ref 135–145)

## 2023-08-09 MED ORDER — POTASSIUM CHLORIDE CRYS ER 20 MEQ PO TBCR
40.0000 meq | EXTENDED_RELEASE_TABLET | Freq: Once | ORAL | Status: AC
  Administered 2023-08-09: 40 meq via ORAL
  Filled 2023-08-09: qty 2

## 2023-08-09 MED ORDER — HYDROMORPHONE HCL 1 MG/ML IJ SOLN
1.0000 mg | Freq: Once | INTRAMUSCULAR | Status: AC
  Administered 2023-08-10: 1 mg via INTRAVENOUS
  Filled 2023-08-09: qty 1

## 2023-08-09 MED ORDER — ASPIRIN 325 MG PO TBEC
325.0000 mg | DELAYED_RELEASE_TABLET | Freq: Every day | ORAL | Status: DC
  Administered 2023-08-09 – 2023-08-10 (×2): 325 mg via ORAL
  Filled 2023-08-09 (×2): qty 1

## 2023-08-09 MED ORDER — ATORVASTATIN CALCIUM 40 MG PO TABS
40.0000 mg | ORAL_TABLET | Freq: Every day | ORAL | Status: DC
  Administered 2023-08-09 – 2023-08-10 (×2): 40 mg via ORAL
  Filled 2023-08-09 (×2): qty 1
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
## 2023-08-09 NOTE — Plan of Care (Signed)
  Problem: Education: Goal: Knowledge of disease or condition will improve Outcome: Progressing   Problem: Ischemic Stroke/TIA Tissue Perfusion: Goal: Complications of ischemic stroke/TIA will be minimized Outcome: Progressing   Problem: Coping: Goal: Will identify appropriate support needs Outcome: Progressing   Problem: Health Behavior/Discharge Planning: Goal: Goals will be collaboratively established with patient/family Outcome: Progressing   Problem: Self-Care: Goal: Ability to communicate needs accurately will improve Outcome: Progressing   Problem: Nutrition: Goal: Risk of aspiration will decrease Outcome: Progressing   Problem: Education: Goal: Knowledge of General Education information will improve Description: Including pain rating scale, medication(s)/side effects and non-pharmacologic comfort measures Outcome: Progressing   Problem: Clinical Measurements: Goal: Respiratory complications will improve Outcome: Progressing Goal: Cardiovascular complication will be avoided Outcome: Progressing   Problem: Nutrition: Goal: Adequate nutrition will be maintained Outcome: Progressing   Problem: Elimination: Goal: Will not experience complications related to urinary retention Outcome: Progressing

## 2023-08-09 NOTE — Progress Notes (Signed)
 STROKE TEAM PROGRESS NOTE   SUBJECTIVE (INTERVAL HISTORY) His daughter and sons are at the bedside.  Overall his condition is close to his baseline at home.  Per family, patient drove to see his parents.  At his parents house, patient was drowsy sleepy dozing off frequently and not able to have coherent conversation.  Then he left and was found to drive around in the airport where airport police called 911 and he was sent to ER for evaluation.  Currently much more awake alert and close to baseline.  Patient was on several psychoactive medication and pain medication at home, however denies overdose but he also did not remember what happened yesterday.  Apparently, family asked his wife who is taking charge of his medication and his wife did not aware of out of normal medication use.    OBJECTIVE Temp:  [97.9 F (36.6 C)-98.7 F (37.1 C)] 98.5 F (36.9 C) (02/04 1649) Pulse Rate:  [61-79] 72 (02/04 1649) Cardiac Rhythm: Normal sinus rhythm (02/04 1658) Resp:  [16-20] 18 (02/04 1649) BP: (90-146)/(65-126) 122/70 (02/04 1649) SpO2:  [86 %-94 %] 94 % (02/04 1649)  No results for input(s): GLUCAP in the last 168 hours. Recent Labs  Lab 08/08/23 1507 08/09/23 0446  NA 140 138  K 3.3* 3.2*  CL 103 103  CO2 27 27  GLUCOSE 100* 102*  BUN 11 9  CREATININE 0.60* 0.64  CALCIUM  8.2* 7.8*   Recent Labs  Lab 08/08/23 1507  AST 20  ALT 22  ALKPHOS 69  BILITOT 0.4  PROT 5.8*  ALBUMIN  2.8*   Recent Labs  Lab 08/08/23 1507 08/09/23 0446  WBC 2.6* 2.4*  NEUTROABS 1.7  --   HGB 9.7* 9.4*  HCT 32.4* 31.4*  MCV 82.9 82.6  PLT 229 196   No results for input(s): CKTOTAL, CKMB, CKMBINDEX, TROPONINI in the last 168 hours. No results for input(s): LABPROT, INR in the last 72 hours. Recent Labs    08/08/23 2036  COLORURINE YELLOW  LABSPEC 1.032*  PHURINE 6.0  GLUCOSEU NEGATIVE  HGBUR NEGATIVE  BILIRUBINUR NEGATIVE  KETONESUR 20*  PROTEINUR 30*  NITRITE NEGATIVE   LEUKOCYTESUR NEGATIVE       Component Value Date/Time   CHOL 179 08/09/2023 0446   TRIG 112 08/09/2023 0446   HDL 59 08/09/2023 0446   CHOLHDL 3.0 08/09/2023 0446   VLDL 22 08/09/2023 0446   LDLCALC 98 08/09/2023 0446   Lab Results  Component Value Date   HGBA1C 5.0 08/09/2023      Component Value Date/Time   LABOPIA POSITIVE (A) 08/08/2023 0804   COCAINSCRNUR NONE DETECTED 08/08/2023 0804   LABBENZ POSITIVE (A) 08/08/2023 0804   AMPHETMU NONE DETECTED 08/08/2023 0804   THCU POSITIVE (A) 08/08/2023 0804   LABBARB NONE DETECTED 08/08/2023 0804    No results for input(s): ETH in the last 168 hours.  I have personally reviewed the radiological images below and agree with the radiology interpretations.  US  CHEST (PLEURAL EFFUSION) Result Date: 08/09/2023 CLINICAL DATA:  Patient admitted with CVA. Pleural effusion noted on imaging workup. Request for possible thoracentesis. EXAM: CHEST ULTRASOUND COMPARISON:  None Available. FINDINGS: Limited ultrasound right chest demonstrates small pleural effusion with visible lung flap. This is insufficient for safe thoracentesis. IMPRESSION: Small pleural effusion. Procedure was discussed with the patient who wishes to defer procedure at this time. Imaging performed by Franky Rusk PA-C and supervised by Dr. Marcey Moan Electronically Signed   By: Marcey Moan M.D.   On:  08/09/2023 14:43   VAS US  LOWER EXTREMITY VENOUS (DVT) Result Date: 08/09/2023  Lower Venous DVT Study Patient Name:  CANAAN HOLZER  Date of Exam:   08/09/2023 Medical Rec #: [id number]       Accession #:    [id number] Date of Birth: 1955-11-18        Patient Gender: M Patient Age:   68 years Exam Location:  [facility] Procedure:      VAS US  LOWER EXTREMITY VENOUS (DVT) Referring Phys: COSTIN GHERGHE --------------------------------------------------------------------------------  Indications: Swelling.  Risk Factors: None identified. Limitations: Poor ultrasound/tissue  interface. Comparison Study: No prior studies. Performing Technologist: Cordella Collet RVT  Examination Guidelines: A complete evaluation includes B-mode imaging, spectral Doppler, color Doppler, and power Doppler as needed of all accessible portions of each vessel. Bilateral testing is considered an integral part of a complete examination. Limited examinations for reoccurring indications may be performed as noted. The reflux portion of the exam is performed with the patient in reverse Trendelenburg.  +---------+---------------+---------+-----------+----------+-------------------+ RIGHT    CompressibilityPhasicitySpontaneityPropertiesThrombus Aging      +---------+---------------+---------+-----------+----------+-------------------+ CFV      Full           Yes      Yes                                      +---------+---------------+---------+-----------+----------+-------------------+ SFJ      Full                                                             +---------+---------------+---------+-----------+----------+-------------------+ FV Prox  Full                                                             +---------+---------------+---------+-----------+----------+-------------------+ FV Mid   Full                                                             +---------+---------------+---------+-----------+----------+-------------------+ FV DistalFull                                                             +---------+---------------+---------+-----------+----------+-------------------+ PFV      Full                                                             +---------+---------------+---------+-----------+----------+-------------------+ POP      Full           Yes  Yes                                      +---------+---------------+---------+-----------+----------+-------------------+ PTV      Full                                                              +---------+---------------+---------+-----------+----------+-------------------+ PERO                                                  Not well visualized +---------+---------------+---------+-----------+----------+-------------------+   +---------+---------------+---------+-----------+----------+-------------------+ LEFT     CompressibilityPhasicitySpontaneityPropertiesThrombus Aging      +---------+---------------+---------+-----------+----------+-------------------+ CFV      Full           Yes      Yes                                      +---------+---------------+---------+-----------+----------+-------------------+ SFJ      Full                                                             +---------+---------------+---------+-----------+----------+-------------------+ FV Prox  Full                                                             +---------+---------------+---------+-----------+----------+-------------------+ FV Mid   Full                                                             +---------+---------------+---------+-----------+----------+-------------------+ FV DistalFull                                                             +---------+---------------+---------+-----------+----------+-------------------+ PFV      Full                                                             +---------+---------------+---------+-----------+----------+-------------------+ POP      Full           Yes      Yes                                      +---------+---------------+---------+-----------+----------+-------------------+  PTV      Full                                                             +---------+---------------+---------+-----------+----------+-------------------+ PERO                                                  Not well visualized  +---------+---------------+---------+-----------+----------+-------------------+     Summary: RIGHT: - There is no evidence of deep vein thrombosis in the lower extremity. However, portions of this examination were limited- see technologist comments above.  - No cystic structure found in the popliteal fossa.  LEFT: - There is no evidence of deep vein thrombosis in the lower extremity. However, portions of this examination were limited- see technologist comments above.  - No cystic structure found in the popliteal fossa.  *See table(s) above for measurements and observations. Electronically signed by Norman Serve on 08/09/2023 at 1:57:46 PM.    Final    ECHOCARDIOGRAM COMPLETE Result Date: 08/09/2023    ECHOCARDIOGRAM REPORT   Patient Name:   Imaad KACEE KOREN Date of Exam: 08/09/2023 Medical Rec #:  [id number]      Height:       70.0 in Accession #:    [id number]     Weight:       169.8 lb Date of Birth:  [birth year]       BSA:          1.947 m Patient Age:    67 years       BP:           116/96 mmHg Patient Gender: M              HR:           65 bpm. Exam Location:  Inpatient Procedure: 2D Echo, Cardiac Doppler and Color Doppler Indications:    Stroke I63.9  History:        Patient has no prior history of Echocardiogram examinations.  Sonographer:    Jayson Gaskins Referring Phys: [id number] VISHAL R PATEL  Sonographer Comments: Suboptimal parasternal window. Image acquisition challenging due to respiratory motion. IMPRESSIONS  1. Left ventricular ejection fraction, by estimation, is 65 to 70%. The left ventricle has normal function. The left ventricle has no regional wall motion abnormalities. Left ventricular diastolic parameters were normal.  2. Right ventricular systolic function is normal. The right ventricular size is normal. Tricuspid regurgitation signal is inadequate for assessing PA pressure.  3. Left atrial size was mildly dilated.  4. The mitral valve is normal in structure. No evidence of mitral valve  regurgitation. No evidence of mitral stenosis. Moderate mitral annular calcification.  5. The aortic valve has an indeterminant number of cusps. There is moderate calcification of the aortic valve. There is moderate thickening of the aortic valve. Aortic valve regurgitation is trivial. Moderate to severe aortic valve stenosis.  6. The inferior vena cava is normal in size with greater than 50% respiratory variability, suggesting right atrial pressure of 3 mmHg. Conclusion(s)/Recommendation(s): No intracardiac source of embolism detected on this transthoracic study. Consider a transesophageal echocardiogram to exclude cardiac source of embolism if clinically indicated. FINDINGS  Left Ventricle: Left ventricular ejection fraction, by estimation, is 65 to 70%. The left ventricle has normal function. The left ventricle has no regional wall motion abnormalities. The left ventricular internal cavity size was normal in size. There is  no left ventricular hypertrophy. Left ventricular diastolic parameters were normal. Right Ventricle: The right ventricular size is normal. Right vetricular wall thickness was not well visualized. Right ventricular systolic function is normal. Tricuspid regurgitation signal is inadequate for assessing PA pressure. Left Atrium: Left atrial size was mildly dilated. Right Atrium: Right atrial size was normal in size. Pericardium: There is no evidence of pericardial effusion. Mitral Valve: The mitral valve is normal in structure. Moderate mitral annular calcification. No evidence of mitral valve regurgitation. No evidence of mitral valve stenosis. Tricuspid Valve: The tricuspid valve is grossly normal. Tricuspid valve regurgitation is not demonstrated. Aortic Valve: The aortic valve has an indeterminant number of cusps. There is moderate calcification of the aortic valve. There is moderate thickening of the aortic valve. Aortic valve regurgitation is trivial. Aortic regurgitation PHT measures 530  msec.  Moderate to severe aortic stenosis is present. Aortic valve mean gradient measures 30.7 mmHg. Aortic valve peak gradient measures 47.1 mmHg. Aortic valve area, by VTI measures 1.19 cm. Pulmonic Valve: The pulmonic valve was not well visualized. Pulmonic valve regurgitation is not visualized. No evidence of pulmonic stenosis. Aorta: The aortic root is normal in size and structure. Venous: The inferior vena cava is normal in size with greater than 50% respiratory variability, suggesting right atrial pressure of 3 mmHg. IAS/Shunts: The interatrial septum was not well visualized.  LEFT VENTRICLE PLAX 2D LVIDd:         4.50 cm   Diastology LVIDs:         2.70 cm   LV e' medial:    5.55 cm/s LV PW:         1.10 cm   LV E/e' medial:  13.0 LV IVS:        0.70 cm   LV e' lateral:   9.36 cm/s LVOT diam:     2.29 cm   LV E/e' lateral: 7.7 LV SV:         88 LV SV Index:   45 LVOT Area:     4.12 cm  RIGHT VENTRICLE RV S prime:     14.00 cm/s TAPSE (M-mode): 2.8 cm LEFT ATRIUM             Index        RIGHT ATRIUM           Index LA Vol (A2C):   48.7 ml 25.02 ml/m  RA Area:     12.60 cm LA Vol (A4C):   45.7 ml 23.47 ml/m  RA Volume:   24.30 ml  12.48 ml/m LA Biplane Vol: 48.0 ml 24.66 ml/m  AORTIC VALVE AV Area (Vmax):    1.10 cm AV Area (Vmean):   1.21 cm AV Area (VTI):     1.19 cm AV Vmax:           343.25 cm/s AV Vmean:          267.333 cm/s AV VTI:            0.740 m AV Peak Grad:      47.1 mmHg AV Mean Grad:      30.7 mmHg LVOT Vmax:         92.00 cm/s LVOT Vmean:        78.800 cm/s LVOT  VTI:          0.214 m LVOT/AV VTI ratio: 0.29 AI PHT:            530 msec MITRAL VALVE MV Area (PHT): 3.46 cm    SHUNTS MV Decel Time: 219 msec    Systemic VTI:  0.21 m MV E velocity: 72.00 cm/s  Systemic Diam: 2.29 cm MV A velocity: 62.10 cm/s MV E/A ratio:  1.16 Mihai Croitoru MD Electronically signed by Jerel Balding MD Signature Date/Time: 08/09/2023/11:10:02 AM    Final    CT ANGIO HEAD NECK W WO CM Result Date:  08/09/2023 CLINICAL DATA:  Initial evaluation for acute stroke. EXAM: CT ANGIOGRAPHY HEAD AND NECK WITH AND WITHOUT CONTRAST TECHNIQUE: Multidetector CT imaging of the head and neck was performed using the standard protocol during bolus administration of intravenous contrast. Multiplanar CT image reconstructions and MIPs were obtained to evaluate the vascular anatomy. Carotid stenosis measurements (when applicable) are obtained utilizing NASCET criteria, using the distal internal carotid diameter as the denominator. RADIATION DOSE REDUCTION: This exam was performed according to the departmental dose-optimization program which includes automated exposure control, adjustment of the mA and/or kV according to patient size and/or use of iterative reconstruction technique. CONTRAST:  OMNIPAQUE  IOHEXOL  350 MG/ML SOLN COMPARISON:  CT and MRI from earlier the same day. Comparison also made with recent spinal MRI from 07/10/2023. FINDINGS: CTA NECK FINDINGS Aortic arch: Visualized aortic arch within normal limits for caliber with standard branch pattern. No significant stenosis or other abnormality about the origin of the great vessels. Mild aortic atherosclerosis. Right carotid system: Of right common and internal carotid arteries are tortuous with patent without dissection. Mild atheromatous change about the right carotid bulb without hemodynamically significant stenosis. Left carotid system: Left common and internal carotid arteries are tortuous but patent without dissection. Mild-to-moderate atheromatous change about the left carotid bulb without hemodynamically significant stenosis. Vertebral arteries: Both vertebral arteries arise from subclavian arteries. Atheromatous change about the origin of the left vertebral artery with mild-to-moderate stenosis. Vertebral arteries tortuous but patent distally without stenosis or dissection. Skeleton: Severe pathologic compression fracture with near vertebral plana formation  at T3 noted. Additional mild-to-moderate pathologic compression fractures involving the T2 and T4 vertebral bodies noted as well. Appearance is similar to prior MRI. Underlying cervical spondylosis, most pronounced at C5-6 and C6-7. Other neck: No other acute finding. Upper chest: Moderate to large layering bilateral pleural effusions. Multiple scattered bilateral pulmonary nodules present, consistent with metastatic disease. Large is visible discrete nodule present within the left upper lobe in measures up to 1.9 cm. Few scattered enlarged mediastinal lymph nodes measuring up to 1.7 cm noted, possibly reflecting nodal metastases. Review of the MIP images confirms the above findings CTA HEAD FINDINGS Anterior circulation: Mild atheromatous change about the carotid siphons without stenosis. A1 segments, anterior communicating artery complex, and anterior cerebral arteries patent without stenosis. Hypoplastic right A1 segment noted. No M1 stenosis or occlusion. Distal MCA branches perfused and symmetric. Posterior circulation: Both V4 segments patent without stenosis. Left PICA patent. Right PICA not well seen. Basilar patent without stenosis. Superior cerebellar and posterior cerebral arteries patent bilaterally. Venous sinuses: Patent allowing for timing the contrast bolus. Anatomic variants: As above.  No aneurysm. Review of the MIP images confirms the above findings IMPRESSION: 1. Negative CTA for large vessel occlusion or other emergent finding. 2. Atheromatous change about the origin of the left vertebral artery with associated mild-to-moderate stenosis. 3. Multiple scattered bilateral pulmonary nodules, consistent  with metastatic disease. Few scattered enlarged mediastinal lymph nodes as above, also possibly metastatic in nature. 4. Moderate to large layering bilateral pleural effusions. 5. Osseous metastatic disease with associated pathologic compression fractures of T2 through T4. Electronically Signed   By:  Morene Hoard M.D.   On: 08/09/2023 00:32   MR Brain W and Wo Contrast Result Date: 08/08/2023 CLINICAL DATA:  Transient ischemic attack EXAM: MRI HEAD WITHOUT AND WITH CONTRAST TECHNIQUE: Multiplanar, multiecho pulse sequences of the brain and surrounding structures were obtained without and with intravenous contrast. CONTRAST:  8mL GADAVIST  GADOBUTROL  1 MMOL/ML IV SOLN COMPARISON:  None Available. FINDINGS: Brain: There are punctate foci of abnormal diffusion restriction within the right temporal lobe in foci of diffusion weighted hyperintensity in the right occipital lobe without a clear ADC correlate. These likely indicating combination of early and late subacute infarcts. No acute or chronic hemorrhage. There is multifocal hyperintense T2-weighted signal within the white matter. Generalized volume loss. The midline structures are normal. Thickened appearance of the right tentorial leaflet. Vascular: Normal flow voids. Skull and upper cervical spine: Normal calvarium and skull base. Visualized upper cervical spine and soft tissues are normal. Sinuses/Orbits:No paranasal sinus fluid levels or advanced mucosal thickening. No mastoid or middle ear effusion. Normal orbits. 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 No hemorrhage or mass effect. Electronically Signed   By: Franky Stanford M.D.   On: 08/08/2023 20:52   CT Head Wo Contrast Result Date: 08/08/2023 CLINICAL DATA:  Mental status change, unknown cause EXAM: CT HEAD WITHOUT CONTRAST TECHNIQUE: Contiguous axial images were obtained from the base of the skull through the vertex without intravenous contrast. RADIATION DOSE REDUCTION: This exam was performed according to the departmental dose-optimization program which includes automated exposure control, adjustment of the mA and/or kV according to patient size and/or use of iterative reconstruction technique. COMPARISON:  None Available. FINDINGS:  Brain: No evidence of acute infarction, hemorrhage, hydrocephalus, extra-axial collection or mass lesion/mass effect. Patchy white matter hypodensities, nonspecific but compatible with chronic microvascular ischemic disease. Vascular: No hyperdense vessel. Skull: No acute fracture. Sinuses/Orbits: Clear sinuses.  No acute orbital findings. Other: No mastoid effusions. IMPRESSION: No evidence of acute intracranial abnormality. Electronically Signed   By: Gilmore GORMAN Molt M.D.   On: 08/08/2023 18:10     PHYSICAL EXAM  Temp:  [97.9 F (36.6 C)-98.7 F (37.1 C)] 98.5 F (36.9 C) (02/04 1649) Pulse Rate:  [61-79] 72 (02/04 1649) Resp:  [16-20] 18 (02/04 1649) BP: (90-146)/(65-126) 122/70 (02/04 1649) SpO2:  [86 %-94 %] 94 % (02/04 1649)  General - Well nourished, well developed, in no apparent distress.  Ophthalmologic - fundi not visualized due to noncooperation.  Cardiovascular - Regular rhythm and rate.  Neuro - awake, alert, eyes open, orientated to age, place, month but not year. No aphasia, fluent language, following all simple commands. Able to name and repeat.  However some difficulty with WORLD backward spelling.  No gaze palsy, tracking bilaterally, visual field full. No facial droop. Tongue midline. Bilateral UEs 5/5, no drift. Bilaterally LEs 5/5, no drift. Sensation symmetrical bilaterally, b/l FTN intact, gait not tested.     ASSESSMENT/PLAN Mr. LEMAR BAKOS is a 68 y.o. 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 No TNK given due to outside window.    Altered mental status, most likely encephalopathy from pain medication  over use and polypharmacy.  Less likely from stroke given tiny in size Patient apparently drowsy sleepy and then confused, now near baseline He is on multiple psychoactive medications and pain medication for cancer, ?  Medication  overuse UDS positive for opiates, benzo and THC New finding of lung metastasis with large pleural effusion, ?  Encephalopathy from respiratory issue but denies any respiratory symptoms Need better pain medication and psychoactive medication management.  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  Recommend anticoagulation for stroke prevention, however patient does have fall risk and may need palliative care discussion.  Will need to discuss with primary team and oncology. Ongoing aggressive stroke risk factor management Therapy recommendations: Pending Disposition: Pending  Hypertension Stable Long term BP goal normotensive  Hyperlipidemia Home meds: None LDL 98, goal < 70 Now on lipitor 40 Continue statin at discharge  Other Stroke Risk Factors Advanced age  Other Active Problems Polypharmacy - including Xanax , Lexapro , fentanyl , gabapentin , oxycodone , Seroquel , trazodone   Hospital day # 0  I spent additional inpatient 30 minutes  face-to-face time with the patient, more than 50% of which was spent in counseling and coordination of care, reviewing test results, images and medication, and discussing the diagnosis, treatment plan and potential prognosis. This patient's care requiresreview of multiple databases, neurological assessment, discussion with family, other specialists and medical decision making of high complexity.   I had long discussion with daughter at bedside, updated pt current condition, treatment plan and potential prognosis, and answered all the questions.  She expressed understanding and appreciation.    Ary Cummins, MD PhD Stroke Neurology 08/09/2023 6:59 PM    To contact Stroke Continuity provider, please refer to [email or web address]. After hours, contact General Neurology

## 2023-08-09 NOTE — Progress Notes (Signed)
Bilateral lower extremity venous duplex has been completed. Preliminary results can be found in CV Proc through chart review.   08/09/23 9:22 AM Olen Cordial RVT

## 2023-08-09 NOTE — Consult Note (Signed)
 NEUROLOGY CONSULT NOTE   Date of service: August 09, 2023 Patient Name: TREVELL PARISEAU MRN:  [id number] DOB:  1956/01/13 Chief Complaint: strokes on mri Requesting Provider: Tobie Jorie JONELLE, MD  History of Present Illness  JACIEL DIEM is a 68 y.o. 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  [place] car.  EMS was called for evaluation and brought him for evaluation at [facility].  Brain imaging in the form of MRI of the brain was done and that showed early and late subacute infarcts in the right temporal and occipital lobes. Case is discussed with me.  I recommended transfer to [facility] at the start of my shift.  He still at this time remains at [facility] ER awaiting bed.  LKW: Unclear Modified rankin score: 2-Slight disability-UNABLE to perform all activities but does not need assistance IV Thrombolysis: Unclear last known well, subacute appearing strokes EVT: Unclear last known well, strokes are subacute in appearance  NIHSS components Score: Comment  1a Level of Conscious 0[x]  1[]  2[]  3[]      1b LOC Questions 0[x]  1[]  2[]       1c LOC Commands 0[x]  1[]  2[]       2 Best Gaze 0[x]  1[]  2[]       3 Visual 0[x]  1[]  2[]  3[]      4 Facial Palsy 0[x]  1[]  2[]  3[]      5a Motor Arm - left 0[x]  1[]  2[]  3[]  4[]  UN[]    5b Motor Arm - Right 0[x]  1[]  2[]  3[]  4[]  UN[]    6a Motor Leg - Left 0[x]  1[]  2[]  3[]  4[]  UN[]    6b Motor Leg - Right 0[x]  1[]  2[]  3[]  4[]  UN[]    7 Limb Ataxia 0[x]  1[]  2[]  3[]  UN[]     8 Sensory 0[x]  1[]  2[]  UN[]      9 Best Language 0[x]  1[]  2[]  3[]      10 Dysarthria 0[x]  1[]  2[]  UN[]      11 Extinct. and Inattention 0[x]  1[]  2[]       TOTAL: 0      ROS  Comprehensive ROS performed and pertinent positives documented in HPI    Past History   Past Medical History:   Diagnosis Date   Anxiety    Arthritis    Cancer (HCC)    Prostate cancer   Depression    GERD (gastroesophageal reflux disease)    H/O seasonal allergies    History of kidney stones    x1   Prostate cancer [facility])     Past Surgical History:  Procedure Laterality Date   CHEST TUBE INSERTION  07/16/2019   trauma    COLONOSCOPY     HERNIA REPAIR Bilateral    20 yrs ago   LAMINECTOMY N/A 03/31/2023   Procedure: THORACIC LAMINECTOMY FOR TUMOR;  Surgeon: Onetha Kuba, MD;  Location: [facility] OR;  Service: Neurosurgery;  Laterality: N/A;   LYMPHADENECTOMY Bilateral 09/19/2013   Procedure: LYMPHADENECTOMY  PELVIC LYMPH NODE DISSECTION;  Surgeon: Alm GORMAN Fragmin, MD;  Location: [facility] ORS;  Service: Urology;  Laterality: Bilateral;   PROSTATE BIOPSY     ROBOT ASSISTED LAPAROSCOPIC RADICAL PROSTATECTOMY N/A 09/19/2013   Procedure: ROBOTIC ASSISTED LAPAROSCOPIC RADICAL PROSTATECTOMY;  Surgeon: Alm GORMAN Fragmin, MD;  Location: [facility] ORS;  Service: Urology;  Laterality: N/A;   TONSILLECTOMY     child  VARICOCELE EXCISION      Family History: Family History  Problem Relation Age of Onset   Prostate cancer Father    Breast cancer Neg Hx    Colon cancer Neg Hx    Pancreatic cancer Neg Hx     Social History  reports that he has never smoked. He has never used smokeless tobacco. He reports current alcohol use. He reports that he does not use drugs.  Allergies  Allergen Reactions   Codeine Nausea And Vomiting   Penicillin G Other (See Comments)    Stomach upset    Medications   Current Facility-Administered Medications:     stroke: early stages of recovery book, , Does not apply, Once, Tobie Jorie SAUNDERS, MD   acetaminophen  (TYLENOL ) tablet 650 mg, 650 mg, Oral, Q4H PRN **OR** acetaminophen  (TYLENOL ) 160 MG/5ML solution 650 mg, 650 mg, Per Tube, Q4H PRN **OR** acetaminophen  (TYLENOL ) suppository 650 mg, 650 mg, Rectal, Q4H PRN, Tobie, Vishal R, MD   ALPRAZolam  (XANAX ) tablet 0.5-1 mg, 0.5-1 mg, Oral,  BID PRN, Patel, Vishal R, MD   docusate sodium  (COLACE) capsule 100 mg, 100 mg, Oral, Daily, Patel, Vishal R, MD   enoxaparin  (LOVENOX ) injection 40 mg, 40 mg, Subcutaneous, Q24H, Patel, Vishal R, MD   escitalopram  (LEXAPRO ) tablet 30 mg, 30 mg, Oral, Daily, Patel, Vishal R, MD   fentaNYL  (DURAGESIC ) 50 MCG/HR 1 patch, 1 patch, Transdermal, Q72H, Patel, Vishal R, MD   folic acid  (FOLVITE ) tablet 1 mg, 1 mg, Oral, Daily, Patel, Vishal R, MD   gabapentin  (NEURONTIN ) capsule 900 mg, 900 mg, Oral, BID, Patel, Vishal R, MD, 900 mg at 08/09/23 0013   ibuprofen  (ADVIL ) tablet 800 mg, 800 mg, Oral, PRN, Tobie, Vishal R, MD   melatonin tablet 3 mg, 3 mg, Oral, QHS PRN, Tobie, Vishal R, MD   methocarbamol  (ROBAXIN ) tablet 750 mg, 750 mg, Oral, Q8H PRN, Tobie, Vishal R, MD   ondansetron  (ZOFRAN ) injection 4 mg, 4 mg, Intravenous, Q6H PRN, Tobie, Vishal R, MD   oxyCODONE  (Oxy IR/ROXICODONE ) immediate release tablet 5-10 mg, 5-10 mg, Oral, Q4H PRN, Tobie, Jorie SAUNDERS, MD   pantoprazole  (PROTONIX ) EC tablet 40 mg, 40 mg, Oral, BID AC, Patel, Vishal R, MD   senna (SENOKOT) tablet 17.2 mg, 2 tablet, Oral, QHS, Patel, Vishal R, MD, 17.2 mg at 08/09/23 0013   senna-docusate (Senokot-S) tablet 1 tablet, 1 tablet, Oral, QHS PRN, Tobie Jorie SAUNDERS, MD   traZODone  (DESYREL ) tablet 100 mg, 100 mg, Oral, QHS, Patel, Vishal R, MD, 100 mg at 08/09/23 0014  Current Outpatient Medications:    QUEtiapine  (SEROQUEL ) 200 MG tablet, SMARTSIG:1 Tablet(s) By Mouth Every Evening, Disp: , Rfl:    QUEtiapine  (SEROQUEL ) 25 MG tablet, TAKE 1 TABLET BY MOUTH TWICE DAILY AND 2 TABLETS AT BEDTIME TO IMPROVE MOOD, Disp: , Rfl:    ALPRAZolam  (XANAX ) 1 MG tablet, TAKE 1/2 TO 1 TABLET BY MOUTH TWICE DAILY AS NEEDED FOR ANXIETY. MUST HAVE OFFICE OR TELEMEDICINE VISIT FOR REFILLS, Disp: , Rfl:    cyanocobalamin  1000 MCG tablet, Take 1 tablet (1,000 mcg total) by mouth daily., Disp: 30 tablet, Rfl: 0   dexamethasone  (DECADRON ) 2 MG tablet, Take  twice daily for 3 weeks and take the afternoon dose earlier at 4 pm to have better sleeps not at bedtime. Decrease to 2 mg daily after 3 weeks until next visit., Disp: 120 tablet, Rfl: 0   docusate sodium  (COLACE) 100 MG capsule, Take 1 capsule (100 mg total) by mouth 2 (two)  times daily. (Patient taking differently: Take 100 mg by mouth daily.), Disp: 60 capsule, Rfl: 2   escitalopram  (LEXAPRO ) 20 MG tablet, Take 1.5 tablets (30 mg total) by mouth daily., Disp: 45 tablet, Rfl: 0   fentaNYL  (DURAGESIC ) 50 MCG/HR, Place 1 patch onto the skin every 3 (three) days., Disp: 10 patch, Rfl: 0   folic acid  (FOLVITE ) 1 MG tablet, Take 1 tablet (1 mg total) by mouth daily., Disp: 30 tablet, Rfl: 2   gabapentin  (NEURONTIN ) 300 MG capsule, Take 3 capsules (900 mg total) by mouth 2 (two) times daily., Disp: 270 capsule, Rfl: 2   ibuprofen  (ADVIL ) 200 MG tablet, Take 800 mg by mouth as needed for mild pain or moderate pain., Disp: , Rfl:    magnesium  oxide (MAG-OX) 400 (240 Mg) MG tablet, Take 400 mg by mouth daily., Disp: , Rfl:    melatonin 3 MG TABS tablet, Take 1 tablet (3 mg total) by mouth at bedtime as needed (insomnia)., Disp: , Rfl:    methocarbamol  (ROBAXIN ) 750 MG tablet, Take 1 tablet (750 mg total) by mouth every 8 (eight) hours as needed for muscle spasms., Disp: 30 tablet, Rfl: 0   oxyCODONE  (OXY IR/ROXICODONE ) 5 MG immediate release tablet, Take 1-2 tablets (5-10 mg total) by mouth every 4 (four) hours as needed for severe pain (pain score 7-10) or breakthrough pain., Disp: 90 tablet, Rfl: 0   pantoprazole  (PROTONIX ) 40 MG tablet, Take 1 tablet (40 mg total) by mouth 2 (two) times daily before a meal., Disp: 60 tablet, Rfl: 1   polyethylene glycol powder (MIRALAX ) 17 GM/SCOOP powder, Take 17 g by mouth daily as needed., Disp: 238 g, Rfl: 0   senna (SENOKOT) 8.6 MG TABS tablet, Take 2 tablets (17.2 mg total) by mouth at bedtime. Hold for loose stools or diarrhea, Disp: 120 tablet, Rfl: 0   traZODone   (DESYREL ) 100 MG tablet, Take 1 tablet (100 mg total) by mouth at bedtime., Disp: 30 tablet, Rfl: 1  Vitals   Vitals:   08/22/2023 2330 08/09/23 0000 08/09/23 0015 08/09/23 0159  BP:    102/65  Pulse: 65 67 64 79  Resp:    18  Temp:    98 F (36.7 C)  TempSrc:    Oral  SpO2: (!) 89% 93% 91% 91%  Weight:      Height:        Body mass index is 24.36 kg/m.  Physical Exam  General: Cachectic, pale looking man in no acute distress HEENT: Normocephalic atraumatic Lungs: Clear Cardiovascular: Regular rate rhythm Neurological exam He is awake alert oriented to the fact that he is in the hospital but told he is at [facility] when he was currently roomed at the [place] emergency department. He told me the correct month.  Did not get the year right-said 2022. No dysarthria No aphasia Cranial nerves II to XII intact Motor examination with no drift Sensation tact light touch Coordination exam reveals no dysmetria  Labs/Imaging/Neurodiagnostic studies   CBC:  Recent Labs  Lab 2023-08-22 1507 08/09/23 0446  WBC 2.6* 2.4*  NEUTROABS 1.7  --   HGB 9.7* 9.4*  HCT 32.4* 31.4*  MCV 82.9 82.6  PLT 229 196   Basic Metabolic Panel:  Lab Results  Component Value Date   NA 138 08/09/2023   K 3.2 (L) 08/09/2023   CO2 27 08/09/2023   GLUCOSE 102 (H) 08/09/2023   BUN 9 08/09/2023   CREATININE 0.64 08/09/2023   CALCIUM  7.8 (  L) 08/09/2023   GFRNONAA >60 08/09/2023   GFRAA >60 07/17/2019  Alcohol Level     Component Value Date/Time   ETH <10 10/22/2022 1911   INR  Lab Results  Component Value Date   INR 1.0 10/22/2022   Imaging personally reviewed MRI Brain: Punctate early and late subacute infarcts in the right temporal and occipital lobes.  No hemorrhage or mass effect.  CT angiogram of the head and neck-negative for LVO.  Atheromatous changes of the origin of the left vertebral artery with mild to moderate stenosis.  Multiple scattered bilateral pulmonary  nodules consistent with metastatic disease.  Few scattered enlarged mediastinal lymph nodes-also metastatic in nature.  Moderate to large bilateral pleural effusions.  Osseous metastatic disease with associated pathological compression fractures T2-T4  ASSESSMENT   CHAYTON MURATA is a 68 y.o. 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 His last known well is unclear. For that reason, he is not a candidate for any emergent stroke treatment. Given his history of metastatic cancer, hypercoagulability is on the leading list of differentials for the strokes but he needs a full workup.  Impression: Acute ischemic stroke-etiology under investigation, likely hypercoagulability from metastatic prostate cancer  RECOMMENDATIONS  Admit to hospitalist at [facility] Frequent neurochecks Telemetry Aspirin  81 for now.  If it is deemed that the strokes are from hypercoagulability, may need anticoagulation in the long-term. High intensity statin for goal LDL less than 70 2D echo, J8r, lipid panel Blood pressure goal normotension given unclear last known well and subacute appearing strokes.  Avoid hypotension. PT Speech therapy OT  Preliminary plan discussed with the ED APP [facility] Stroke team to follow ______________________________________________________________________    Signed, Eligio Lav, MD [facility]

## 2023-08-09 NOTE — Progress Notes (Signed)
Patient received from [facility] ED, alert and oriented X4, vital signs stable, family members at bedside, tele box connected, call bell within reach, bed alarm on, will continue to monitor

## 2023-08-10 ENCOUNTER — Other Ambulatory Visit (HOSPITAL_COMMUNITY): Payer: Self-pay

## 2023-08-10 ENCOUNTER — Ambulatory Visit: Payer: Medicare Other | Admitting: Urology

## 2023-08-10 ENCOUNTER — Inpatient Hospital Stay: Payer: Medicare Other

## 2023-08-10 DIAGNOSIS — I63531 Cerebral infarction due to unspecified occlusion or stenosis of right posterior cerebral artery: Secondary | ICD-10-CM | POA: Diagnosis not present

## 2023-08-10 DIAGNOSIS — G893 Neoplasm related pain (acute) (chronic): Secondary | ICD-10-CM

## 2023-08-10 DIAGNOSIS — I63411 Cerebral infarction due to embolism of right middle cerebral artery: Secondary | ICD-10-CM

## 2023-08-10 DIAGNOSIS — C7951 Secondary malignant neoplasm of bone: Secondary | ICD-10-CM

## 2023-08-10 DIAGNOSIS — G9520 Unspecified cord compression: Secondary | ICD-10-CM | POA: Diagnosis not present

## 2023-08-10 DIAGNOSIS — C61 Malignant neoplasm of prostate: Secondary | ICD-10-CM

## 2023-08-10 DIAGNOSIS — Z515 Encounter for palliative care: Secondary | ICD-10-CM

## 2023-08-10 LAB — CBC
HCT: 29.1 % — ABNORMAL LOW (ref 39.0–52.0)
Hemoglobin: 8.9 g/dL — ABNORMAL LOW (ref 13.0–17.0)
MCH: 24.9 pg — ABNORMAL LOW (ref 26.0–34.0)
MCHC: 30.6 g/dL (ref 30.0–36.0)
MCV: 81.3 fL (ref 80.0–100.0)
Platelets: 212 10*3/uL (ref 150–400)
RBC: 3.58 MIL/uL — ABNORMAL LOW (ref 4.22–5.81)
RDW: 20 % — ABNORMAL HIGH (ref 11.5–15.5)
WBC: 2.5 10*3/uL — ABNORMAL LOW (ref 4.0–10.5)
nRBC: 0 % (ref 0.0–0.2)

## 2023-08-10 LAB — BASIC METABOLIC PANEL
Anion gap: 7 (ref 5–15)
BUN: 9 mg/dL (ref 8–23)
CO2: 27 mmol/L (ref 22–32)
Calcium: 8.1 mg/dL — ABNORMAL LOW (ref 8.9–10.3)
Chloride: 106 mmol/L (ref 98–111)
Creatinine, Ser: 0.67 mg/dL (ref 0.61–1.24)
GFR, Estimated: >60 mL/min (ref >60)
Glucose, Bld: 103 mg/dL — ABNORMAL HIGH (ref 70–99)
Potassium: 4.2 mmol/L (ref 3.5–5.1)
Sodium: 140 mmol/L (ref 135–145)

## 2023-08-10 LAB — MAGNESIUM: Magnesium: 2 mg/dL (ref 1.7–2.4)

## 2023-08-10 MED ORDER — ACETAMINOPHEN 325 MG PO TABS: 650.0000 mg | ORAL_TABLET | ORAL | 0 refills | Status: DC | PRN

## 2023-08-10 MED ORDER — SENNOSIDES-DOCUSATE SODIUM 8.6-50 MG PO TABS: 1.0000 | ORAL_TABLET | Freq: Every evening | ORAL | 0 refills | Status: DC | PRN

## 2023-08-10 MED ORDER — ASPIRIN 81 MG PO TBEC: 81.0000 mg | DELAYED_RELEASE_TABLET | Freq: Every day | ORAL | Status: DC

## 2023-08-10 MED ORDER — ATORVASTATIN CALCIUM 40 MG PO TABS: 40.0000 mg | ORAL_TABLET | Freq: Every day | ORAL | 0 refills | Status: DC

## 2023-08-10 MED ORDER — ASPIRIN 81 MG PO TBEC: 81.0000 mg | DELAYED_RELEASE_TABLET | Freq: Every day | ORAL | 12 refills | Status: DC

## 2023-08-10 NOTE — Care Management Obs Status (Signed)
MEDICARE OBSERVATION STATUS NOTIFICATION   Patient Details  Name: Nathan Berg MRN: [id number] Date of Birth: March 25, 1956   Medicare Observation Status Notification Given:  Yes    Lawerance Sabal, RN 08/10/2023, 8:28 AM

## 2023-08-10 NOTE — Progress Notes (Signed)
Patient transported to discharge lounge

## 2023-08-10 NOTE — TOC Initial Note (Signed)
 Transition of Care [facility]) - Initial/Assessment Note    Patient Details  Name: Nathan Berg MRN: [id number] Date of Birth: 1955-12-06  [facility] CM/SW Contact:    Andrez JULIANNA George, RN Phone Number: 08/10/2023, 12:02 PM  Clinical Narrative:                  Pt is from home with his spouse. She is with him most of the time. Pt was still driving prior to admission.  Pt was managing his own medications. OT consulted CM for potential bubble packs at home. CM spoke with pt and he is in agreement if the pharmacy will also deliver the meds. He doesn't want mail order. CM called: [facility]/ [facility]/ [facility]/ his pharmacy: [facility]--either they dont do bubble packs or they wont deliver to his address. Son at the bedside knows an pharmacist locally that he will reach out to. In the interim the son and agreed to filling pts pill boxes a week at a time.  No follow up per PT/OT.   TOC following.  Expected Discharge Plan: Home/Self Care Barriers to Discharge: Continued Medical Work up   Patient Goals and CMS Choice            Expected Discharge Plan and Services   Discharge Planning Services: CM Consult   Living arrangements for the past 2 months: Single Family Home                                      Prior Living Arrangements/Services Living arrangements for the past 2 months: Single Family Home Lives with:: Spouse Patient language and need for interpreter reviewed:: Yes Do you feel safe going back to the place where you live?: Yes        Care giver support system in place?: Yes (comment) Current home services: DME (walker/ shower seat) Criminal Activity/Legal Involvement Pertinent to Current Situation/Hospitalization: No - Comment as needed  Activities of Daily Living      Permission Sought/Granted                  Emotional Assessment Appearance:: Appears stated age Attitude/Demeanor/Rapport: Engaged Affect (typically observed):  Accepting Orientation: : Oriented to Self, Oriented to Place, Oriented to  Time, Oriented to Situation   Psych Involvement: No (comment)  Admission diagnosis:  CVA (cerebral vascular accident) [facility]) [I63.9] Cerebrovascular accident (CVA) due to other mechanism [facility]) [I63.89] Patient Active Problem List   Diagnosis Date Noted   CVA (cerebral vascular accident) (HCC) 08/08/2023   IDA (iron deficiency anemia) 07/20/2023   Goals of care, counseling/discussion 07/11/2023   Folate deficiency 07/11/2023   GERD (gastroesophageal reflux disease) 07/10/2023   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 07/10/2023   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   Drug-induced neutropenia (HCC) 05/31/2023   Constipation 05/31/2023   Genetic testing 05/27/2023   At risk for side effect of medication 05/16/2023   Cancer related pain 05/16/2023   Zoster 04/21/2023   Adjustment disorder 04/19/2023   Spinal stenosis, thoracic 04/15/2023   Thoracic myelopathy 04/14/2023   Compression fracture of T3 vertebra (HCC) 04/13/2023   Anemia 04/13/2023   Low serum vitamin B12 04/07/2023   Pathological compression fracture of thoracic vertebra (HCC) 03/29/2023   Weakness of left lower extremity 03/28/2023   Acute thoracic back pain 03/28/2023   anxiety and depression 03/28/2023   Prostate cancer metastatic  to bone [facility]) 02/01/2020   Pneumothorax, traumatic 07/16/2019   Prostate cancer (HCC) 09/19/2013   PCP:  Lanny Callander, MD Pharmacy:   [facility] [address] [place] - [address] [place] [address] Phone: [phone] Fax: [phone]     Social Drivers of Health (SDOH) Social History: SDOH Screenings   Food Insecurity: No Food Insecurity (07/11/2023)  Housing: Low Risk  (07/11/2023)  Transportation Needs: No Transportation Needs (07/11/2023)  Utilities: Not At Risk (07/11/2023)  Social Connections:  Moderately Isolated (07/11/2023)  Tobacco Use: Low Risk  (07/21/2023)   SDOH Interventions:     Readmission Risk Interventions     No data to display

## 2023-08-10 NOTE — Progress Notes (Signed)
 AVS instructions given, patient verbalized understanding, 0patient ready to be transported to discharge lounge

## 2023-08-10 NOTE — Evaluation (Signed)
 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   Drug-induced neutropenia (HCC) 05/31/2023   Constipation 05/31/2023   Genetic testing 05/27/2023   At risk for side effect of medication 05/16/2023   Cancer related pain 05/16/2023   Zoster 04/21/2023   Adjustment disorder 04/19/2023   Spinal stenosis, thoracic 04/15/2023   Thoracic myelopathy 04/14/2023   Compression fracture of T3 vertebra (HCC) 04/13/2023   Anemia 04/13/2023   Low serum vitamin B12 04/07/2023   Pathological compression fracture of thoracic vertebra (HCC) 03/29/2023   Weakness of left lower extremity 03/28/2023   Acute thoracic back pain 03/28/2023   anxiety and depression 03/28/2023   Prostate cancer metastatic to bone (HCC) 02/01/2020   Pneumothorax, traumatic 07/16/2019   Prostate cancer (HCC) 09/19/2013   Past Medical History:  Past Medical History:  Diagnosis Date   Anxiety    Arthritis    Cancer (HCC)    Prostate cancer   Depression    GERD (gastroesophageal reflux disease)    H/O seasonal allergies    History of kidney stones    x1   Prostate cancer (HCC)    Past Surgical History:  Past Surgical History:  Procedure Laterality Date   CHEST TUBE INSERTION  07/16/2019   trauma    COLONOSCOPY     HERNIA REPAIR Bilateral    20 yrs ago   LAMINECTOMY N/A 03/31/2023   Procedure: THORACIC LAMINECTOMY FOR TUMOR;  Surgeon:  Onetha Kuba, MD;  Location: [facility] OR;  Service: Neurosurgery;  Laterality: N/A;   LYMPHADENECTOMY Bilateral 09/19/2013   Procedure: LYMPHADENECTOMY  PELVIC LYMPH NODE DISSECTION;  Surgeon: Alm GORMAN Fragmin, MD;  Location: [facility];  Service: Urology;  Laterality: Bilateral;   PROSTATE BIOPSY     ROBOT ASSISTED LAPAROSCOPIC RADICAL PROSTATECTOMY N/A 09/19/2013   Procedure: ROBOTIC ASSISTED LAPAROSCOPIC RADICAL PROSTATECTOMY;  Surgeon: Alm GORMAN Fragmin, MD;  Location: [facility];  Service: Urology;  Laterality: N/A;   TONSILLECTOMY     child   VARICOCELE EXCISION     HPI:  Patient is a 68 y.o. male with PMH: metastatic prostate cancer with cord compression secondary to epidural tumor from metastases spanning C5-T5 s/p laminectomy T3, folate and iron deficiency anemia, depression/anxiety, and chronic cancer related pain who presented to the ED for evaluation of confusion. MRI brain contrast showed punctate early and late subacute infarcts in the right temporal and occipital lobes; NIHSS 0; confusion improved and ?due to medication error.   Assessment / Plan / Recommendation Clinical Impression  Patient presents with a mild cognitive impairment but intact speech and language function as per this evaluation. He participated in [employer] Mental status examination) and his score of 24 out of possible 30 placed him in scoring category for Mild Neurocognitive Disorder. 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 OT and PT have both evaluated patient and are not recommending follow up. SLP is recommending consideration for HH versus OP SLP services to work on higher level cognition to ensure patient  is able to return to managing ADL's such as medication organization and management, financial management/bill paying, driving/wayfinding.    SLP Assessment  SLP Recommendation/Assessment: All further Speech Lanaguage Pathology  needs can be addressed in the next venue of care SLP Visit Diagnosis: Cognitive communication deficit (R41.841)    Recommendations for follow up therapy are one component of a multi-disciplinary discharge planning process, led by the attending physician.  Recommendations may be updated based on patient status, additional functional criteria and insurance authorization.    Follow Up Recommendations  Follow physician's recommendations for discharge plan and follow up therapies    Assistance Recommended at Discharge  Intermittent Supervision/Assistance  Functional Status Assessment Patient has had a recent decline in their functional status and demonstrates the ability to make significant improvements in function in a reasonable and predictable amount of time.  Frequency and Duration           SLP Evaluation Cognition  Overall Cognitive Status: Impaired/Different from baseline Arousal/Alertness: Awake/alert Orientation Level: Oriented to place;Oriented to person;Oriented to time;Oriented to situation Year: 2025 Month: February Day of Week: Incorrect Attention: Sustained Sustained Attention: Appears intact Memory: Impaired Memory Impairment: Other (comment);Retrieval deficit (mildly impaired retrieval deficit which may not be completely acute) Awareness: Appears intact Problem Solving: Appears intact Executive Function: Self Correcting Self Correcting: Impaired Self Correcting Impairment: Functional basic;Functional complex       Comprehension  Auditory Comprehension Overall Auditory Comprehension: Appears within functional limits for tasks assessed    Expression Expression Primary Mode of Expression: Verbal Verbal Expression Overall Verbal Expression:  Appears within functional limits for tasks assessed Initiation: No impairment Repetition: No impairment Naming: No impairment   Oral / Motor  Oral Motor/Sensory Function Overall Oral Motor/Sensory Function: Within functional limits Motor Speech Overall Motor Speech: Appears within functional limits for tasks assessed Respiration: Within functional limits Resonance: Within functional limits Articulation: Within functional limitis Intelligibility: Intelligible Motor Planning: Witnin functional limits            Norleen IVAR Blase, MA, CCC-SLP Speech Therapy

## 2023-08-10 NOTE — Progress Notes (Signed)
   Palliative Medicine Inpatient Follow Up Note  The PMT has observed the consultation for Nathan Berg.   I spoke to Dr. Sherrill this early evening.   Dr. Sherrill asks if [name] can be followed up in the OP Palliative clinic. I shared that [name] is established already and I have send Fannie Cousar an email for further follow up.  Dr. Sherrill shares the plan for discharge this evening.   No Charge ______________________________________________________________________________________ Rosaline Becton [place] Palliative Medicine Team Team Cell Phone: [phone] Please utilize secure chat with additional questions, if there is no response within 30 minutes please call the above phone number  Palliative Medicine Team providers are available by phone from 7am to 7pm daily and can be reached through the team cell phone.  Should this patient require assistance outside of these hours, please call the patient's attending physician.

## 2023-08-10 NOTE — Evaluation (Signed)
 Occupational Therapy Evaluation Patient Details Name: Nathan Berg MRN: [id number] DOB: 1956/04/23 Today's Date: 08/10/2023   History of Present Illness Pt is a 68 y/o male presenting 08/08/23 with confusion. 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  He has a RW that he uses at all time.  Currently patient is Supervision with ADLs, demonstrating some decreased cognition with attention and memory and has a notable Left upper quadrant visual field loss.  He was unaware of this field loss and have recommended that he see an eye doctor before resuming driving.  Did not recommend further OT, but will follow in the hospital to facilitate return home.  Son was present for session and recommendations        If plan is discharge home, recommend the following: Direct supervision/assist for medications management;Direct supervision/assist for financial management;Assist for transportation    Functional Status Assessment  Patient has had a recent decline in their functional status and demonstrates the ability to make significant improvements in function in a reasonable and predictable amount of time.  Equipment Recommendations   None     Recommendations for Other Services  None     Precautions / Restrictions Precautions Precautions: Fall      Mobility Bed Mobility Overal bed mobility: Independent                  Transfers Overall transfer level: Needs assistance Equipment used: Rolling walker (2 wheels) Transfers: Sit to/from Stand Sit to Stand: Supervision           General transfer  comment: v/c for hand placement - scoot forward      Balance Overall balance assessment: Mild deficits observed, not formally tested                                         ADL either performed or assessed with clinical judgement   ADL Overall ADL's : Needs assistance/impaired Eating/Feeding: Independent   Grooming: Oral care;Wash/dry hands;Supervision/safety   Upper Body Bathing: Supervision/ safety   Lower Body Bathing: Supervison/ safety   Upper Body Dressing : Supervision/safety   Lower Body Dressing: Supervision/safety   Toilet Transfer: Supervision/safety   Toileting- Clothing Manipulation and Hygiene: Supervision/safety       Functional mobility during ADLs: Supervision/safety;Rolling walker (2 wheels) General ADL Comments: Patient appears baseline with ADLS     Vision Baseline Vision/History: 1 Wears glasses (for reading) Ability to See in Adequate Light: 0 Adequate Patient Visual Report: No change from baseline Vision Assessment?: Yes Ocular Range of Motion: Within Functional Limits Alignment/Gaze Preference: Within Defined Limits Tracking/Visual Pursuits: Able to track stimulus in all quads without difficulty Saccades: Within functional limits Convergence: Within functional limits Visual Fields: Left inferior homonymous quadranopsia Additional Comments: Patient shutting eyes and blinking with visual assessment.     Perception         Praxis         Pertinent Vitals/Pain Pain Assessment Pain Assessment: No/denies pain     Extremity/Trunk Assessment Upper Extremity Assessment Upper Extremity Assessment: Generalized weakness;Right hand dominant;LUE  deficits/detail LUE Deficits / Details: arthritic L shoulder with palpable decrepitus. AROM to ~ 100 - scapular elevation with movement.  bicep and tricep strength 4/5 LUE Sensation: WNL LUE Coordination: WNL   Lower Extremity Assessment Lower Extremity Assessment: Defer to PT  evaluation   Cervical / Trunk Assessment Cervical / Trunk Assessment: Kyphotic   Communication Communication Communication: No apparent difficulties   Cognition Arousal: Alert Behavior During Therapy: WFL for tasks assessed/performed Overall Cognitive Status: Impaired/Different from baseline Area of Impairment: Attention, Memory                   Current Attention Level: Alternating Memory: Decreased short-term memory               General Comments  Son - Rankin present for session    Exercises     Shoulder Instructions      Home Living Family/patient expects to be discharged to:: Private residence Living Arrangements: Spouse/significant other Available Help at Discharge: Family;Available 24 hours/day Type of Home: House Home Access: Stairs to enter [employer] of Steps: 3 Entrance Stairs-Rails: Right;Left Home Layout: Multi-level Alternate Level Stairs-Number of Steps: 2 steps for living areas, no rails Alternate Level Stairs-Rails: None Bathroom Shower/Tub: Walk-in shower;Door (approximately a 2 inch lip to step over)   Bathroom Toilet: Standard Bathroom Accessibility: Yes How Accessible: Accessible via walker Home Equipment: Rolling Walker (2 wheels);Shower seat - built in;Cane - single point;Grab bars - tub/shower;Shower seat;Wheelchair - manual          Prior Functioning/Environment Prior Level of Function : Independent/Modified Independent             Mobility Comments: uses RW all the time ADLs Comments: independent ADLs; wife manages medications, pt pays bills (reports driving)        OT Problem List: Decreased strength;Decreased activity tolerance;Impaired balance (sitting and/or standing);Impaired vision/perception;Decreased cognition;Pain      OT Treatment/Interventions:      OT Goals(Current goals can be found in the care plan section) Acute Rehab OT Goals Patient Stated Goal: To go home OT Goal Formulation: With  patient/family Time For Goal Achievement: 08/24/23 Potential to Achieve Goals: Good ADL Goals Additional ADL Goal #1: Patient will independently verbalize understanding of recomendations related to visual field loss Additional ADL Goal #2: Patient will demonstrate ability to tolerate OOB x 15 minutes to participate in ADL activities with supervision  OT Frequency:      Co-evaluation              AM-PAC OT 6 Clicks Daily Activity     Outcome Measure Help from another person eating meals?: None Help from another person taking care of personal grooming?: None Help from another person toileting, which includes using toliet, bedpan, or urinal?: A Little Help from another person bathing (including washing, rinsing, drying)?: A Little Help from another person to put on and taking off regular upper body clothing?: A Little Help from another person to put on and taking off regular lower body clothing?: A Little 6 Click Score: 20   End of Session Equipment Utilized During Treatment: Gait belt;Rolling walker (2 wheels)  Activity Tolerance: Patient limited by fatigue (Although denied pain, patient weight shifting after standing x 3-4 minutes for grooming) Patient left: in chair;with call bell/phone within reach;with family/visitor present  OT Visit Diagnosis: Muscle weakness (generalized) (M62.81);Unsteadiness on feet (R26.81)                Time: 9067-8987 OT Time Calculation (min): 40 min Charges:  OT General Charges $OT Visit: 1 Visit OT Evaluation $OT Eval Low Complexity: 1 Low OT Treatments $Self Care/Home Management : 8-22 mins Nathan Berg OTR/L   Nathan Berg 08/10/2023, 11:37 AM

## 2023-08-10 NOTE — Progress Notes (Signed)
 STROKE TEAM PROGRESS NOTE   SUBJECTIVE (INTERVAL HISTORY) No family at bedside.  Patient lying bed, awake alert, neuro stable.  Discussed with Dr. Tina oncologist and recommend aspirin  81 given fall risk and anemia.  Patient has outpatient palliative care follow-up.   OBJECTIVE Temp:  [97.6 F (36.4 C)-99.3 F (37.4 C)] 98.1 F (36.7 C) (02/05 1541) Pulse Rate:  [63-74] 69 (02/05 1541) Cardiac Rhythm: Normal sinus rhythm (02/05 0837) Resp:  [16-18] 16 (02/05 1541) BP: (90-99)/(58-65) 92/61 (02/05 1541) SpO2:  [91 %-96 %] 96 % (02/05 1541)  No results for input(s): GLUCAP in the last 168 hours. Recent Labs  Lab 08/08/23 1507 08/09/23 0446 08/10/23 0616  NA 140 138 140  K 3.3* 3.2* 4.2  CL 103 103 106  CO2 27 27 27   GLUCOSE 100* 102* 103*  BUN 11 9 9   CREATININE 0.60* 0.64 0.67  CALCIUM  8.2* 7.8* 8.1*  MG  --   --  2.0   Recent Labs  Lab 08/08/23 1507  AST 20  ALT 22  ALKPHOS 69  BILITOT 0.4  PROT 5.8*  ALBUMIN  2.8*   Recent Labs  Lab 08/08/23 1507 08/09/23 0446 08/10/23 0616  WBC 2.6* 2.4* 2.5*  NEUTROABS 1.7  --   --   HGB 9.7* 9.4* 8.9*  HCT 32.4* 31.4* 29.1*  MCV 82.9 82.6 81.3  PLT 229 196 212   No results for input(s): CKTOTAL, CKMB, CKMBINDEX, TROPONINI in the last 168 hours. No results for input(s): LABPROT, INR in the last 72 hours. Recent Labs    08/08/23 2036  COLORURINE YELLOW  LABSPEC 1.032*  PHURINE 6.0  GLUCOSEU NEGATIVE  HGBUR NEGATIVE  BILIRUBINUR NEGATIVE  KETONESUR 20*  PROTEINUR 30*  NITRITE NEGATIVE  LEUKOCYTESUR NEGATIVE       Component Value Date/Time   CHOL 179 08/09/2023 0446   TRIG 112 08/09/2023 0446   HDL 59 08/09/2023 0446   CHOLHDL 3.0 08/09/2023 0446   VLDL 22 08/09/2023 0446   LDLCALC 98 08/09/2023 0446   Lab Results  Component Value Date   HGBA1C 5.0 08/09/2023      Component Value Date/Time   LABOPIA POSITIVE (A) 08/08/2023 0804   COCAINSCRNUR NONE DETECTED 08/08/2023 0804   LABBENZ  POSITIVE (A) 08/08/2023 0804   AMPHETMU NONE DETECTED 08/08/2023 0804   THCU POSITIVE (A) 08/08/2023 0804   LABBARB NONE DETECTED 08/08/2023 0804    No results for input(s): ETH in the last 168 hours.  I have personally reviewed the radiological images below and agree with the radiology interpretations.  US  CHEST (PLEURAL EFFUSION) Result Date: 08/09/2023 CLINICAL DATA:  Patient admitted with CVA. Pleural effusion noted on imaging workup. Request for possible thoracentesis. EXAM: CHEST ULTRASOUND COMPARISON:  None Available. FINDINGS: Limited ultrasound right chest demonstrates small pleural effusion with visible lung flap. This is insufficient for safe thoracentesis. IMPRESSION: Small pleural effusion. Procedure was discussed with the patient who wishes to defer procedure at this time. Imaging performed by Franky Rusk PA-C and supervised by Dr. Marcey Moan Electronically Signed   By: Marcey Moan M.D.   On: 08/09/2023 14:43   VAS US  LOWER EXTREMITY VENOUS (DVT) Result Date: 08/09/2023  Lower Venous DVT Study Patient Name:  TRELYN VANDERLINDE  Date of Exam:   08/09/2023 Medical Rec #: [id number]       Accession #:    [id number] Date of Birth: 10-Dec-1955        Patient Gender: M Patient Age:   68 years Exam Location:  [facility] [place] [facility] Procedure:      VAS US  LOWER EXTREMITY VENOUS (DVT) Referring Phys: COSTIN GHERGHE --------------------------------------------------------------------------------  Indications: Swelling.  Risk Factors: None identified. Limitations: Poor ultrasound/tissue interface. Comparison Study: No prior studies. Performing Technologist: Cordella Collet RVT  Examination Guidelines: A complete evaluation includes B-mode imaging, spectral Doppler, color Doppler, and power Doppler as needed of all accessible portions of each vessel. Bilateral testing is considered an integral part of a complete examination. Limited examinations for reoccurring indications may be performed as noted. The  reflux portion of the exam is performed with the patient in reverse Trendelenburg.  +---------+---------------+---------+-----------+----------+-------------------+ RIGHT    CompressibilityPhasicitySpontaneityPropertiesThrombus Aging      +---------+---------------+---------+-----------+----------+-------------------+ CFV      Full           Yes      Yes                                      +---------+---------------+---------+-----------+----------+-------------------+ SFJ      Full                                                             +---------+---------------+---------+-----------+----------+-------------------+ FV Prox  Full                                                             +---------+---------------+---------+-----------+----------+-------------------+ FV Mid   Full                                                             +---------+---------------+---------+-----------+----------+-------------------+ FV DistalFull                                                             +---------+---------------+---------+-----------+----------+-------------------+ PFV      Full                                                             +---------+---------------+---------+-----------+----------+-------------------+ POP      Full           Yes      Yes                                      +---------+---------------+---------+-----------+----------+-------------------+ PTV      Full                                                             +---------+---------------+---------+-----------+----------+-------------------+  PERO                                                  Not well visualized +---------+---------------+---------+-----------+----------+-------------------+   +---------+---------------+---------+-----------+----------+-------------------+ LEFT     CompressibilityPhasicitySpontaneityPropertiesThrombus Aging       +---------+---------------+---------+-----------+----------+-------------------+ CFV      Full           Yes      Yes                                      +---------+---------------+---------+-----------+----------+-------------------+ SFJ      Full                                                             +---------+---------------+---------+-----------+----------+-------------------+ FV Prox  Full                                                             +---------+---------------+---------+-----------+----------+-------------------+ FV Mid   Full                                                             +---------+---------------+---------+-----------+----------+-------------------+ FV DistalFull                                                             +---------+---------------+---------+-----------+----------+-------------------+ PFV      Full                                                             +---------+---------------+---------+-----------+----------+-------------------+ POP      Full           Yes      Yes                                      +---------+---------------+---------+-----------+----------+-------------------+ PTV      Full                                                             +---------+---------------+---------+-----------+----------+-------------------+ PERO  Not well visualized +---------+---------------+---------+-----------+----------+-------------------+     Summary: RIGHT: - There is no evidence of deep vein thrombosis in the lower extremity. However, portions of this examination were limited- see technologist comments above.  - No cystic structure found in the popliteal fossa.  LEFT: - There is no evidence of deep vein thrombosis in the lower extremity. However, portions of this examination were limited- see technologist comments above.  - No cystic  structure found in the popliteal fossa.  *See table(s) above for measurements and observations. Electronically signed by Norman Serve on 08/09/2023 at 1:57:46 PM.    Final    ECHOCARDIOGRAM COMPLETE Result Date: 08/09/2023    ECHOCARDIOGRAM REPORT   Patient Name:   Salik KORREY SCHLEICHER Date of Exam: 08/09/2023 Medical Rec #:  [id number]      Height:       70.0 in Accession #:    [id number]     Weight:       169.8 lb Date of Birth:  1956/06/27       BSA:          1.947 m Patient Age:    67 years       BP:           116/96 mmHg Patient Gender: M              HR:           65 bpm. Exam Location:  Inpatient Procedure: 2D Echo, Cardiac Doppler and Color Doppler Indications:    Stroke I63.9  History:        Patient has no prior history of Echocardiogram examinations.  Sonographer:    Jayson Gaskins Referring Phys: [id number] VISHAL R PATEL  Sonographer Comments: Suboptimal parasternal window. Image acquisition challenging due to respiratory motion. IMPRESSIONS  1. Left ventricular ejection fraction, by estimation, is 65 to 70%. The left ventricle has normal function. The left ventricle has no regional wall motion abnormalities. Left ventricular diastolic parameters were normal.  2. Right ventricular systolic function is normal. The right ventricular size is normal. Tricuspid regurgitation signal is inadequate for assessing PA pressure.  3. Left atrial size was mildly dilated.  4. The mitral valve is normal in structure. No evidence of mitral valve regurgitation. No evidence of mitral stenosis. Moderate mitral annular calcification.  5. The aortic valve has an indeterminant number of cusps. There is moderate calcification of the aortic valve. There is moderate thickening of the aortic valve. Aortic valve regurgitation is trivial. Moderate to severe aortic valve stenosis.  6. The inferior vena cava is normal in size with greater than 50% respiratory variability, suggesting right atrial pressure of 3 mmHg. Conclusion(s)/Recommendation(s):  No intracardiac source of embolism detected on this transthoracic study. Consider a transesophageal echocardiogram to exclude cardiac source of embolism if clinically indicated. FINDINGS  Left Ventricle: Left ventricular ejection fraction, by estimation, is 65 to 70%. The left ventricle has normal function. The left ventricle has no regional wall motion abnormalities. The left ventricular internal cavity size was normal in size. There is  no left ventricular hypertrophy. Left ventricular diastolic parameters were normal. Right Ventricle: The right ventricular size is normal. Right vetricular wall thickness was not well visualized. Right ventricular systolic function is normal. Tricuspid regurgitation signal is inadequate for assessing PA pressure. Left Atrium: Left atrial size was mildly dilated. Right Atrium: Right atrial size was normal in size. Pericardium: There is no evidence of pericardial effusion. Mitral Valve: The mitral valve is normal in structure. Moderate mitral  annular calcification. No evidence of mitral valve regurgitation. No evidence of mitral valve stenosis. Tricuspid Valve: The tricuspid valve is grossly normal. Tricuspid valve regurgitation is not demonstrated. Aortic Valve: The aortic valve has an indeterminant number of cusps. There is moderate calcification of the aortic valve. There is moderate thickening of the aortic valve. Aortic valve regurgitation is trivial. Aortic regurgitation PHT measures 530 msec.  Moderate to severe aortic stenosis is present. Aortic valve mean gradient measures 30.7 mmHg. Aortic valve peak gradient measures 47.1 mmHg. Aortic valve area, by VTI measures 1.19 cm. Pulmonic Valve: The pulmonic valve was not well visualized. Pulmonic valve regurgitation is not visualized. No evidence of pulmonic stenosis. Aorta: The aortic root is normal in size and structure. Venous: The inferior vena cava is normal in size with greater than 50% respiratory variability, suggesting  right atrial pressure of 3 mmHg. IAS/Shunts: The interatrial septum was not well visualized.  LEFT VENTRICLE PLAX 2D LVIDd:         4.50 cm   Diastology LVIDs:         2.70 cm   LV e' medial:    5.55 cm/s LV PW:         1.10 cm   LV E/e' medial:  13.0 LV IVS:        0.70 cm   LV e' lateral:   9.36 cm/s LVOT diam:     2.29 cm   LV E/e' lateral: 7.7 LV SV:         88 LV SV Index:   45 LVOT Area:     4.12 cm  RIGHT VENTRICLE RV S prime:     14.00 cm/s TAPSE (M-mode): 2.8 cm LEFT ATRIUM             Index        RIGHT ATRIUM           Index LA Vol (A2C):   48.7 ml 25.02 ml/m  RA Area:     12.60 cm LA Vol (A4C):   45.7 ml 23.47 ml/m  RA Volume:   24.30 ml  12.48 ml/m LA Biplane Vol: 48.0 ml 24.66 ml/m  AORTIC VALVE AV Area (Vmax):    1.10 cm AV Area (Vmean):   1.21 cm AV Area (VTI):     1.19 cm AV Vmax:           343.25 cm/s AV Vmean:          267.333 cm/s AV VTI:            0.740 m AV Peak Grad:      47.1 mmHg AV Mean Grad:      30.7 mmHg LVOT Vmax:         92.00 cm/s LVOT Vmean:        78.800 cm/s LVOT VTI:          0.214 m LVOT/AV VTI ratio: 0.29 AI PHT:            530 msec MITRAL VALVE MV Area (PHT): 3.46 cm    SHUNTS MV Decel Time: 219 msec    Systemic VTI:  0.21 m MV E velocity: 72.00 cm/s  Systemic Diam: 2.29 cm MV A velocity: 62.10 cm/s MV E/A ratio:  1.16 Mihai Croitoru MD Electronically signed by Jerel Balding MD Signature Date/Time: 08/09/2023/11:10:02 AM    Final    CT ANGIO HEAD NECK W WO CM Result Date: 08/09/2023 CLINICAL DATA:  Initial evaluation for acute stroke. EXAM: CT ANGIOGRAPHY HEAD  AND NECK WITH AND WITHOUT CONTRAST TECHNIQUE: Multidetector CT imaging of the head and neck was performed using the standard protocol during bolus administration of intravenous contrast. Multiplanar CT image reconstructions and MIPs were obtained to evaluate the vascular anatomy. Carotid stenosis measurements (when applicable) are obtained utilizing NASCET criteria, using the distal internal carotid diameter  as the denominator. RADIATION DOSE REDUCTION: This exam was performed according to the departmental dose-optimization program which includes automated exposure control, adjustment of the mA and/or kV according to patient size and/or use of iterative reconstruction technique. CONTRAST:  100mL OMNIPAQUE  IOHEXOL  350 MG/ML SOLN COMPARISON:  CT and MRI from earlier the same day. Comparison also made with recent spinal MRI from 07/10/2023. FINDINGS: CTA NECK FINDINGS Aortic arch: Visualized aortic arch within normal limits for caliber with standard branch pattern. No significant stenosis or other abnormality about the origin of the great vessels. Mild aortic atherosclerosis. Right carotid system: Of right common and internal carotid arteries are tortuous with patent without dissection. Mild atheromatous change about the right carotid bulb without hemodynamically significant stenosis. Left carotid system: Left common and internal carotid arteries are tortuous but patent without dissection. Mild-to-moderate atheromatous change about the left carotid bulb without hemodynamically significant stenosis. Vertebral arteries: Both vertebral arteries arise from subclavian arteries. Atheromatous change about the origin of the left vertebral artery with mild-to-moderate stenosis. Vertebral arteries tortuous but patent distally without stenosis or dissection. Skeleton: Severe pathologic compression fracture with near vertebral plana formation at T3 noted. Additional mild-to-moderate pathologic compression fractures involving the T2 and T4 vertebral bodies noted as well. Appearance is similar to prior MRI. Underlying cervical spondylosis, most pronounced at C5-6 and C6-7. Other neck: No other acute finding. Upper chest: Moderate to large layering bilateral pleural effusions. Multiple scattered bilateral pulmonary nodules present, consistent with metastatic disease. Large is visible discrete nodule present within the left upper lobe in  measures up to 1.9 cm. Few scattered enlarged mediastinal lymph nodes measuring up to 1.7 cm noted, possibly reflecting nodal metastases. Review of the MIP images confirms the above findings CTA HEAD FINDINGS Anterior circulation: Mild atheromatous change about the carotid siphons without stenosis. A1 segments, anterior communicating artery complex, and anterior cerebral arteries patent without stenosis. Hypoplastic right A1 segment noted. No M1 stenosis or occlusion. Distal MCA branches perfused and symmetric. Posterior circulation: Both V4 segments patent without stenosis. Left PICA patent. Right PICA not well seen. Basilar patent without stenosis. Superior cerebellar and posterior cerebral arteries patent bilaterally. Venous sinuses: Patent allowing for timing the contrast bolus. Anatomic variants: As above.  No aneurysm. Review of the MIP images confirms the above findings IMPRESSION: 1. Negative CTA for large vessel occlusion or other emergent finding. 2. Atheromatous change about the origin of the left vertebral artery with associated mild-to-moderate stenosis. 3. Multiple scattered bilateral pulmonary nodules, consistent with metastatic disease. Few scattered enlarged mediastinal lymph nodes as above, also possibly metastatic in nature. 4. Moderate to large layering bilateral pleural effusions. 5. Osseous metastatic disease with associated pathologic compression fractures of T2 through T4. Electronically Signed   By: Morene Hoard M.D.   On: 08/09/2023 00:32   MR Brain W and Wo Contrast Result Date: 08/08/2023 CLINICAL DATA:  Transient ischemic attack EXAM: MRI HEAD WITHOUT AND WITH CONTRAST TECHNIQUE: Multiplanar, multiecho pulse sequences of the brain and surrounding structures were obtained without and with intravenous contrast. CONTRAST:  8mL GADAVIST  GADOBUTROL  1 MMOL/ML IV SOLN COMPARISON:  None Available. FINDINGS: Brain: There are punctate foci of abnormal diffusion restriction within  the  right temporal lobe in foci of diffusion weighted hyperintensity in the right occipital lobe without a clear ADC correlate. These likely indicating combination of early and late subacute infarcts. No acute or chronic hemorrhage. There is multifocal hyperintense T2-weighted signal within the white matter. Generalized volume loss. The midline structures are normal. Thickened appearance of the right tentorial leaflet. Vascular: Normal flow voids. Skull and upper cervical spine: Normal calvarium and skull base. Visualized upper cervical spine and soft tissues are normal. Sinuses/Orbits:No paranasal sinus fluid levels or advanced mucosal thickening. No mastoid or middle ear effusion. Normal orbits. 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 No hemorrhage or mass effect. Electronically Signed   By: Franky Stanford M.D.   On: 08/08/2023 20:52   CT Head Wo Contrast Result Date: 08/08/2023 CLINICAL DATA:  Mental status change, unknown cause EXAM: CT HEAD WITHOUT CONTRAST TECHNIQUE: Contiguous axial images were obtained from the base of the skull through the vertex without intravenous contrast. RADIATION DOSE REDUCTION: This exam was performed according to the departmental dose-optimization program which includes automated exposure control, adjustment of the mA and/or kV according to patient size and/or use of iterative reconstruction technique. COMPARISON:  None Available. FINDINGS: Brain: No evidence of acute infarction, hemorrhage, hydrocephalus, extra-axial collection or mass lesion/mass effect. Patchy white matter hypodensities, nonspecific but compatible with chronic microvascular ischemic disease. Vascular: No hyperdense vessel. Skull: No acute fracture. Sinuses/Orbits: Clear sinuses.  No acute orbital findings. Other: No mastoid effusions. IMPRESSION: No evidence of acute intracranial abnormality. Electronically Signed   By: Gilmore GORMAN Molt M.D.   On: 08/08/2023  18:10     PHYSICAL EXAM  Temp:  [97.6 F (36.4 C)-99.3 F (37.4 C)] 98.1 F (36.7 C) (02/05 1541) Pulse Rate:  [63-74] 69 (02/05 1541) Resp:  [16-18] 16 (02/05 1541) BP: (90-99)/(58-65) 92/61 (02/05 1541) SpO2:  [91 %-96 %] 96 % (02/05 1541)  General - Well nourished, well developed, in no apparent distress.  Ophthalmologic - fundi not visualized due to noncooperation.  Cardiovascular - Regular rhythm and rate.  Neuro - awake, alert, eyes open, orientated to age, place, month but not year. No aphasia, fluent language, following all simple commands. Able to name and repeat.  However some difficulty with WORLD backward spelling.  No gaze palsy, tracking bilaterally, visual field full. No facial droop. Tongue midline. Bilateral UEs 5/5, no drift. Bilaterally LEs 5/5, no drift. Sensation symmetrical bilaterally, b/l FTN intact, gait not tested.     ASSESSMENT/PLAN Mr. DEREN DEGRAZIA is a 68 y.o. 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 No TNK given due to outside window.    AMS, most likely encephalopathy from pain medication over use and polypharmacy.  Less likely from stroke given tiny in size Patient apparently drowsy sleepy and then confused, now near baseline He is on multiple psychoactive medications and pain medication for cancer, ?  Medication overuse UDS positive for opiates, benzo and THC New finding of lung metastasis with large pleural effusion, ?  Encephalopathy from respiratory issue but denies any respiratory symptoms Need better pain medication and psychoactive medication management.  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 Currently under chemotherapy Continue follow-up with oncology Dr. 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  Discussed with patient oncologist Dr. Tina, given fall risk (fall 5 times in a month) and moderate anemia, will continue aspirin  81 on discharge. Ongoing aggressive stroke risk factor management Therapy recommendations: None  Disposition: Home  Hypertension Stable Long term BP goal normotensive  Hyperlipidemia Home meds: None LDL 98, goal < 70 Now on lipitor 40 Continue statin at discharge  Other Stroke Risk Factors Advanced age  Other Active Problems Polypharmacy - including Xanax , Lexapro , fentanyl , gabapentin , oxycodone , Seroquel , trazodone   Hospital day # 0  Neurology will sign off. Please call with questions. Pt will follow up with stroke clinic NP at [facility] in about 4 weeks. Thanks for the consult.   Ary Cummins, MD PhD Stroke Neurology 08/10/2023 5:02 PM    To contact Stroke Continuity provider, please refer to [email or web address]. After hours, contact General Neurology

## 2023-08-10 NOTE — Plan of Care (Signed)

## 2023-08-10 NOTE — Evaluation (Signed)
 Physical Therapy Evaluation and Discharge Patient Details Name: Nathan Berg MRN: [id number] DOB: 1955-10-15 Today's Date: 08/10/2023  History of Present Illness  Pt is a 68 y/o male presenting 08/08/23 with confusion. 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 Patient and son report he is moving at his baseline with RW. Patient did report that he completes finances at home. He had difficulty with simple subtraction while walking, however he could complete task at rest. OT notified and plans to do further cognitive assessment (also noted Speech to see him as well).  PT is signing off. Thank you for this referral.         If plan is discharge home, recommend the following: Help with stairs or ramp for entrance;Supervision due to cognitive status;Direct supervision/assist for financial management;Direct supervision/assist for medications management   Can travel by private vehicle        Equipment Recommendations None recommended by PT  Recommendations for Other Services  OT consult;Speech consult (for cognition)    Functional Status Assessment Patient has not had a recent decline in their functional status     Precautions / Restrictions Precautions Precautions: Fall      Mobility  Bed Mobility Overal bed mobility: Independent                  Transfers Overall transfer level: Needs assistance Equipment used: Rolling walker (2 wheels) Transfers: Sit to/from Stand Sit to Stand: Supervision           General transfer comment: vc for stand to sit with proximity to bed and  hand placement    Ambulation/Gait Ambulation/Gait assistance: Supervision Gait Distance (Feet): 100 Feet Assistive device: Rolling walker (2 wheels) Gait Pattern/deviations: Step-through pattern, Decreased stride length   Gait velocity interpretation: 1.31 - 2.62 ft/sec, indicative of limited community ambulator   General Gait Details: velocity decreased and became slower when tryihng to count backwards and walk  Careers Information Officer     Tilt Bed    Modified Rankin (Stroke Patients Only) Modified Rankin (Stroke Patients Only) Pre-Morbid Rankin Score: Moderate disability Modified Rankin: Moderate disability     Balance Overall balance assessment: Mild deficits observed, not formally tested                                           Pertinent Vitals/Pain Pain Assessment Pain Assessment: No/denies pain    Home Living Family/patient expects to be discharged to:: Private residence Living Arrangements: Spouse/significant other Available Help at Discharge: Family;Available 24 hours/day Type of Home: House Home Access: Stairs to enter Entrance Stairs-Rails: Right;Left Entrance Stairs-Number of Steps: 3 Alternate Level Stairs-Number of Steps: 2 steps for living areas, no rails Home Layout: Multi-level Home Equipment: Agricultural Consultant (2 wheels);Shower seat - built in;Cane - single point;Grab bars - tub/shower;Shower seat;Wheelchair - manual      Prior Function Prior Level of Function : Independent/Modified Independent             Mobility Comments: uses  RW all the time ADLs Comments: independent ADLs; wife manages medications, pt pays bills     Extremity/Trunk Assessment   Upper Extremity Assessment Upper Extremity Assessment: Defer to OT evaluation    Lower Extremity Assessment Lower Extremity Assessment: Overall WFL for tasks assessed (sensation, coordination intact)    Cervical / Trunk Assessment Cervical / Trunk  Assessment: Kyphotic  Communication   Communication Communication: No apparent difficulties  Cognition Arousal: Alert Behavior During Therapy: WFL for tasks assessed/performed Overall Cognitive Status: Impaired/Different from baseline Area of Impairment: Attention                   Current Attention Level: Sustained           General Comments: difficulty counting backwards by 3s while simultaneously walking; able to do at rest        General Comments General comments (skin integrity, edema, etc.): son arrived during session; agrees pt is at baseline for mobility    Exercises     Assessment/Plan    PT Assessment Patient does not need any further PT services  PT Problem List         PT Treatment Interventions      PT Goals (Current goals can be found in the Care Plan section)  Acute Rehab PT Goals Patient Stated Goal: return home PT Goal Formulation: All assessment and education complete, DC therapy    Frequency       Co-evaluation               AM-PAC PT 6 Clicks Mobility  Outcome Measure Help needed turning from your back to your side while in a flat bed without using bedrails?: None Help needed moving from lying on your back to sitting on the side of a flat bed without using bedrails?: None Help needed moving to and from a bed to a chair (including a wheelchair)?: A Little Help needed standing up from a chair using your arms (e.g., wheelchair or bedside chair)?: A Little Help needed to walk in hospital room?: A Little Help needed climbing 3-5 steps with a railing? : A Little 6 Click Score: 20    End of Session Equipment Utilized During Treatment: Gait belt Activity Tolerance: Patient tolerated treatment well Patient left: in bed;with call bell/phone within reach;with bed alarm set;with family/visitor present Nurse Communication: Mobility status;Other (comment) (no PT needs) PT Visit Diagnosis: Unsteadiness on feet (R26.81)    Time:  9160-9094 PT Time Calculation (min) (ACUTE ONLY): 26 min   Charges:   PT Evaluation $PT Eval Low Complexity: 1 Low PT Treatments $Gait Training: 8-22 mins PT General Charges $$ ACUTE PT VISIT: 1 Visit          Macario RAMAN, PT Acute Rehabilitation Services  Office [phone]   Macario SHAUNNA Soja 08/10/2023, 9:18 AM

## 2023-08-10 NOTE — Plan of Care (Signed)

## 2023-08-10 NOTE — TOC Transition Note (Signed)
 Transition of Care [facility]) - Discharge Note   Patient Details  Name: Nathan Berg MRN: [id number] Date of Birth: Mar 08, 1956  Transition of Care [facility]) CM/SW Contact:  Andrez JULIANNA George, RN Phone Number: 08/10/2023, 3:49 PM   Clinical Narrative:     Pt is discharging home with self care. No needs per therapies.  Son is in agreement with assisting with pill box at home.  Pt has transportation home.  Final next level of care: Home/Self Care Barriers to Discharge: No Barriers Identified   Patient Goals and CMS Choice            Discharge Placement                       Discharge Plan and Services Additional resources added to the After Visit Summary for     Discharge Planning Services: CM Consult                                 Social Drivers of Health (SDOH) Interventions SDOH Screenings   Food Insecurity: No Food Insecurity (07/11/2023)  Housing: Low Risk  (07/11/2023)  Transportation Needs: No Transportation Needs (07/11/2023)  Utilities: Not At Risk (07/11/2023)  Social Connections: Moderately Isolated (07/11/2023)  Tobacco Use: Low Risk  (07/21/2023)     Readmission Risk Interventions     No data to display

## 2023-08-10 NOTE — Discharge Summary (Signed)
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 allowing for timing the contrast bolus. Anatomic variants: As above.  No aneurysm. Review of the MIP images confirms the above findings IMPRESSION: 1. Negative CTA for large vessel occlusion or other emergent finding. 2. Atheromatous change about the origin of the left vertebral artery with associated mild-to-moderate stenosis. 3. Multiple scattered bilateral pulmonary nodules, consistent with metastatic disease. Few scattered enlarged mediastinal lymph nodes as above, also possibly metastatic in nature. 4. Moderate to large layering bilateral pleural effusions. 5. Osseous metastatic disease with associated pathologic compression fractures of T2 through T4. Electronically Signed   By: Morene Hoard M.D.   On: 08/09/2023 00:32   MR Brain W and Wo Contrast Result Date:  08/08/2023 CLINICAL DATA:  Transient ischemic attack EXAM: MRI HEAD WITHOUT AND WITH CONTRAST TECHNIQUE: Multiplanar, multiecho pulse sequences of the brain and surrounding structures were obtained without and with intravenous contrast. CONTRAST:  8mL GADAVIST  GADOBUTROL  1 MMOL/ML IV SOLN COMPARISON:  None Available. FINDINGS: Brain: There are punctate foci of abnormal diffusion restriction within the right temporal lobe in foci of diffusion weighted hyperintensity in the right occipital lobe without a clear ADC correlate. These likely indicating combination of early and late subacute infarcts. No acute or chronic hemorrhage. There is multifocal hyperintense T2-weighted signal within the white matter. Generalized volume loss. The midline structures are normal. Thickened appearance of the right tentorial leaflet. Vascular: Normal flow voids. Skull and upper cervical spine: Normal calvarium and skull base. Visualized upper cervical spine and soft tissues are normal. Sinuses/Orbits:No paranasal sinus fluid levels or advanced mucosal thickening. No mastoid or middle ear effusion. Normal orbits. 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 No hemorrhage or mass effect. Electronically Signed   By: Franky Stanford M.D.   On: 08/08/2023 20:52   CT Head Wo Contrast Result Date: 08/08/2023 CLINICAL DATA:  Mental status change, unknown cause EXAM: CT HEAD WITHOUT CONTRAST TECHNIQUE: Contiguous axial images were obtained from the base of the skull through the vertex without intravenous contrast. RADIATION DOSE REDUCTION: This exam was performed according to the departmental dose-optimization program which includes automated exposure control, adjustment of the mA and/or kV according to patient size and/or use of iterative reconstruction technique. COMPARISON:  None Available. FINDINGS: Brain: No evidence of acute infarction, hemorrhage, hydrocephalus, extra-axial collection or  mass lesion/mass effect. Patchy white matter hypodensities, nonspecific but compatible with chronic microvascular ischemic disease. Vascular: No hyperdense vessel. Skull: No acute fracture. Sinuses/Orbits: Clear sinuses.  No acute orbital findings. Other: No mastoid effusions. IMPRESSION: No evidence of acute intracranial abnormality. Electronically Signed   By: Gilmore GORMAN Molt M.D.   On: 08/08/2023 18:10   Microbiology: Results for orders placed or performed during the Berg encounter of 04/13/23  Varicella-zoster by PCR     Status: Abnormal   Collection Time: 04/21/23  9:15 AM   Specimen: Sterile Swab  Result Value Ref Range Status   Varicella-Zoster, PCR Positive (A) Negative Final    Comment: (NOTE) Varicella Zoster Virus DNA detected. This test was developed and its performance characteristics determined by [facility].  It has not been cleared or approved by the Food and Drug Administration.  The FDA has determined that such clearance or approval is not necessary. Performed At: [facility] [address] [place], [place] [id number] Jennette Shorter MD Ey:[id number]    Labs: CBC: Recent Labs  Lab 08/08/23 1507 08/09/23 0446 08/10/23 0616  WBC 2.6* 2.4* 2.5*  NEUTROABS 1.7  --   --   HGB 9.7* 9.4* 8.9*  HCT 32.4* 31.4* 29.1*  MCV 82.9  82.6 81.3  PLT 229 196 212   Basic Metabolic Panel: Recent Labs  Lab 08/08/23 1507 08/09/23 0446 08/10/23 0616  NA 140 138 140  K 3.3* 3.2* 4.2  CL 103 103 106  CO2 27 27 27   GLUCOSE 100* 102* 103*  BUN 11 9 9   CREATININE 0.60* 0.64 0.67  CALCIUM  8.2* 7.8* 8.1*  MG  --   --  2.0   Liver Function Tests: Recent Labs  Lab 08/08/23 1507  AST 20  ALT 22  ALKPHOS 69  BILITOT 0.4  PROT 5.8*  ALBUMIN  2.8*   CBG: No results for input(s): GLUCAP in the last 168 hours.  Discharge time spent: greater than 30 minutes.  Signed: Alejandro Marker, DO [facility] 08/10/2023

## 2023-08-10 NOTE — Plan of Care (Signed)
 Problem: Education: Goal: Knowledge of disease or condition will improve 08/10/2023 0529 by Jori Roderic CROME, RN Outcome: Progressing 08/10/2023 0239 by Jori Roderic CROME, RN Outcome: Progressing Goal: Knowledge of secondary prevention will improve (MUST DOCUMENT ALL) 08/10/2023 0529 by Jori Roderic CROME, RN Outcome: Progressing 08/10/2023 0239 by Jori Roderic CROME, RN Outcome: Progressing Goal: Knowledge of patient specific risk factors will improve (DELETE if not current risk factor) 08/10/2023 0529 by Jori Roderic CROME, RN Outcome: Progressing 08/10/2023 0239 by Jori Roderic CROME, RN Outcome: Progressing   Problem: Ischemic Stroke/TIA Tissue Perfusion: Goal: Complications of ischemic stroke/TIA will be minimized 08/10/2023 0529 by Jori Roderic CROME, RN Outcome: Progressing 08/10/2023 0239 by Jori Roderic CROME, RN Outcome: Progressing   Problem: Coping: Goal: Will verbalize positive feelings about self 08/10/2023 0529 by Jori Roderic CROME, RN Outcome: Progressing 08/10/2023 0239 by Jori Roderic CROME, RN Outcome: Progressing Goal: Will identify appropriate support needs 08/10/2023 0529 by Jori Roderic CROME, RN Outcome: Progressing 08/10/2023 0239 by Jori Roderic CROME, RN Outcome: Progressing   Problem: Health Behavior/Discharge Planning: Goal: Ability to manage health-related needs will improve 08/10/2023 0529 by Jori Roderic CROME, RN Outcome: Progressing 08/10/2023 0239 by Jori Roderic CROME, RN Outcome: Progressing Goal: Goals will be collaboratively established with patient/family 08/10/2023 0529 by Jori Roderic CROME, RN Outcome: Progressing 08/10/2023 0239 by Jori Roderic CROME, RN Outcome: Progressing   Problem: Self-Care: Goal: Ability to participate in self-care as condition permits will improve 08/10/2023 0529 by Jori Roderic CROME, RN Outcome: Progressing 08/10/2023 0239 by Jori Roderic CROME, RN Outcome: Progressing Goal: Verbalization  of feelings and concerns over difficulty with self-care will improve 08/10/2023 0529 by Jori Roderic CROME, RN Outcome: Progressing 08/10/2023 0239 by Jori Roderic CROME, RN Outcome: Progressing Goal: Ability to communicate needs accurately will improve 08/10/2023 0529 by Jori Roderic CROME, RN Outcome: Progressing 08/10/2023 0239 by Jori Roderic CROME, RN Outcome: Progressing   Problem: Nutrition: Goal: Risk of aspiration will decrease 08/10/2023 0529 by Jori Roderic CROME, RN Outcome: Progressing 08/10/2023 0239 by Jori Roderic CROME, RN Outcome: Progressing Goal: Dietary intake will improve 08/10/2023 0529 by Jori Roderic CROME, RN Outcome: Progressing 08/10/2023 0239 by Jori Roderic CROME, RN Outcome: Progressing   Problem: Education: Goal: Knowledge of General Education information will improve Description: Including pain rating scale, medication(s)/side effects and non-pharmacologic comfort measures 08/10/2023 0529 by Jori Roderic CROME, RN Outcome: Progressing 08/10/2023 0239 by Jori Roderic CROME, RN Outcome: Progressing   Problem: Health Behavior/Discharge Planning: Goal: Ability to manage health-related needs will improve 08/10/2023 0529 by Jori Roderic CROME, RN Outcome: Progressing 08/10/2023 0239 by Jori Roderic CROME, RN Outcome: Progressing   Problem: Clinical Measurements: Goal: Ability to maintain clinical measurements within normal limits will improve 08/10/2023 0529 by Jori Roderic CROME, RN Outcome: Progressing 08/10/2023 0239 by Jori Roderic CROME, RN Outcome: Progressing Goal: Will remain free from infection 08/10/2023 0529 by Jori Roderic CROME, RN Outcome: Progressing 08/10/2023 0239 by Jori Roderic CROME, RN Outcome: Progressing Goal: Diagnostic test results will improve 08/10/2023 0529 by Jori Roderic CROME, RN Outcome: Progressing 08/10/2023 0239 by Jori Roderic CROME, RN Outcome: Progressing Goal: Respiratory complications will  improve 08/10/2023 0529 by Jori Roderic CROME, RN Outcome: Progressing 08/10/2023 0239 by Jori Roderic CROME, RN Outcome: Progressing Goal: Cardiovascular complication will be avoided 08/10/2023 0529 by Jori Roderic CROME, RN Outcome: Progressing 08/10/2023 0239 by Jori Roderic CROME, RN Outcome: Progressing   Problem: Activity: Goal: Risk for activity intolerance will decrease 08/10/2023 0529 by Jori Roderic CROME, RN Outcome: Progressing 08/10/2023 0239 by  Jori Roderic CROME, RN Outcome: Progressing   Problem: Nutrition: Goal: Adequate nutrition will be maintained 08/10/2023 0529 by Jori Roderic CROME, RN Outcome: Progressing 08/10/2023 0239 by Jori Roderic CROME, RN Outcome: Progressing   Problem: Coping: Goal: Level of anxiety will decrease 08/10/2023 0529 by Jori Roderic CROME, RN Outcome: Progressing 08/10/2023 0239 by Jori Roderic CROME, RN Outcome: Progressing   Problem: Elimination: Goal: Will not experience complications related to bowel motility 08/10/2023 0529 by Jori Roderic CROME, RN Outcome: Progressing 08/10/2023 0239 by Jori Roderic CROME, RN Outcome: Progressing Goal: Will not experience complications related to urinary retention 08/10/2023 0529 by Jori Roderic CROME, RN Outcome: Progressing 08/10/2023 0239 by Jori Roderic CROME, RN Outcome: Progressing   Problem: Pain Managment: Goal: General experience of comfort will improve and/or be controlled 08/10/2023 0529 by Jori Roderic CROME, RN Outcome: Progressing 08/10/2023 0239 by Jori Roderic CROME, RN Outcome: Progressing   Problem: Safety: Goal: Ability to remain free from injury will improve 08/10/2023 0529 by Jori Roderic CROME, RN Outcome: Progressing 08/10/2023 0239 by Jori Roderic CROME, RN Outcome: Progressing   Problem: Skin Integrity: Goal: Risk for impaired skin integrity will decrease 08/10/2023 0529 by Jori Roderic CROME, RN Outcome: Progressing 08/10/2023 0239 by Jori Roderic CROME, RN Outcome: Progressing

## 2023-08-12 ENCOUNTER — Other Ambulatory Visit: Payer: Self-pay

## 2023-08-12 NOTE — Progress Notes (Deleted)
 Cardiology Office Note:    Date:  08/12/2023   ID:  [name], DOB 1955-11-28, MRN [id number]  PCP:  Malachy Mood, MD   [facility] Providers Cardiologist:  None { Click to update primary MD,subspecialty MD or APP then REFRESH:1}    Referring MD: Malachy Mood, MD   No chief complaint on file. ***  History of Present Illness:    Nathan Berg is a 68 y.o. male is seen at the request of Dr Mosetta Putt for evaluation of aortic stenosis. 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 Also metabolic encephalopathy due to polypharmacy and pain meds. Managed with antiplatelet therapy. Echo showed no embolic source with normal EF and mod to severe AS.   Past Medical History:  Diagnosis Date   Anxiety    Arthritis    Cancer (HCC)    Prostate cancer   Depression    GERD (gastroesophageal reflux disease)    H/O seasonal allergies    History of kidney stones    x1   Prostate cancer [facility])     Past Surgical History:  Procedure Laterality Date   CHEST TUBE INSERTION  07/16/2019   trauma    COLONOSCOPY     HERNIA REPAIR Bilateral    20 yrs ago   LAMINECTOMY N/A 03/31/2023   Procedure: THORACIC LAMINECTOMY FOR TUMOR;  Surgeon: Donalee Citrin, MD;  Location: [facility] OR;  Service: Neurosurgery;  Laterality: N/A;   LYMPHADENECTOMY Bilateral 09/19/2013   Procedure: LYMPHADENECTOMY  PELVIC LYMPH NODE DISSECTION;  Surgeon: Valetta Fuller, MD;  Location: [facility] ORS;  Service: Urology;  Laterality: Bilateral;   PROSTATE BIOPSY     ROBOT ASSISTED LAPAROSCOPIC RADICAL PROSTATECTOMY N/A 09/19/2013   Procedure: ROBOTIC ASSISTED LAPAROSCOPIC RADICAL PROSTATECTOMY;  Surgeon: Valetta Fuller, MD;  Location: [facility];  Service: Urology;  Laterality: N/A;   TONSILLECTOMY     child   VARICOCELE EXCISION      Current Medications: No outpatient medications have been marked as taking for the 08/15/23 encounter (Appointment) with [place],  Dejia Ebron M, MD.     Allergies:   Codeine and Penicillin g   Social History   Socioeconomic History   Marital status: Married    Spouse name: Not on file   Number of children: Not on file   Years of education: Not on file   Highest education level: Not on file  Occupational History   Not on file  Tobacco Use   Smoking status: Never   Smokeless tobacco: Never  Vaping Use   Vaping status: Never Used  Substance and Sexual Activity   Alcohol use: Yes    Comment: 4 beers per week   Drug use: No   Sexual activity: Yes  Other Topics Concern   Not on file  Social History Narrative   Not on file   Social Drivers of Health   Financial Resource Strain: Not on file  Food Insecurity: No Food Insecurity (07/11/2023)   Hunger Vital Sign    Worried About Running Out of Food in the Last Year: Never true    Ran Out of Food in the Last Year: Never true  Transportation Needs: No Transportation Needs (07/11/2023)   PRAPARE - Administrator, Civil Service (Medical): No    Lack of Transportation (Non-Medical): No  Physical Activity: Not on file  Stress: Not on file  Social Connections: Moderately Isolated (07/11/2023)  Social Advertising account executive [NHANES]    Frequency of Communication with Friends and Family: More than three times a week    Frequency of Social Gatherings with Friends and Family: Once a week    Attends Religious Services: Never    Database administrator or Organizations: No    Attends Engineer, structural: Never    Marital Status: Married     Family History: The patient's ***family history includes Prostate cancer in his father. There is no history of Breast cancer, Colon cancer, or Pancreatic cancer.  ROS:   Please see the history of present illness.    *** All other systems reviewed and are negative.  EKGs/Labs/Other Studies Reviewed:    The following studies were reviewed today: Echo 08/09/23: IMPRESSIONS     1. Left ventricular  ejection fraction, by estimation, is 65 to 70%. The  left ventricle has normal function. The left ventricle has no regional  wall motion abnormalities. Left ventricular diastolic parameters were  normal.   2. Right ventricular systolic function is normal. The right ventricular  size is normal. Tricuspid regurgitation signal is inadequate for assessing  PA pressure.   3. Left atrial size was mildly dilated.   4. The mitral valve is normal in structure. No evidence of mitral valve  regurgitation. No evidence of mitral stenosis. Moderate mitral annular  calcification.   5. The aortic valve has an indeterminant number of cusps. There is  moderate calcification of the aortic valve. There is moderate thickening  of the aortic valve. Aortic valve regurgitation is trivial. Moderate to  severe aortic valve stenosis.   6. The inferior vena cava is normal in size with greater than 50%  respiratory variability, suggesting right atrial pressure of 3 mmHg.   Conclusion(s)/Recommendation(s): No intracardiac source of embolism  detected on this transthoracic study. Consider a transesophageal  echocardiogram to exclude cardiac source of embolism if clinically  indicated.        Recent Labs: 08/08/2023: ALT 22 08/10/2023: BUN 9; Creatinine, Ser 0.67; Hemoglobin 8.9; Magnesium 2.0; Platelets 212; Potassium 4.2; Sodium 140  Recent Lipid Panel    Component Value Date/Time   CHOL 179 08/09/2023 0446   TRIG 112 08/09/2023 0446   HDL 59 08/09/2023 0446   CHOLHDL 3.0 08/09/2023 0446   VLDL 22 08/09/2023 0446   LDLCALC 98 08/09/2023 0446     Risk Assessment/Calculations:   {Does this patient have ATRIAL FIBRILLATION?:[phone]}  No BP recorded.  {Refresh Note OR Click here to enter BP  :1}***         Physical Exam:    VS:  There were no vitals taken for this visit.    Wt Readings from Last 3 Encounters:  08/08/23 169 lb 12.1 oz (77 kg)  07/21/23 168 lb 12.8 oz (76.6 kg)  07/10/23 186 lb 4.6  oz (84.5 kg)     GEN: *** Well nourished, well developed in no acute distress HEENT: Normal NECK: No JVD; No carotid bruits LYMPHATICS: No lymphadenopathy CARDIAC: ***RRR, no murmurs, rubs, gallops RESPIRATORY:  Clear to auscultation without rales, wheezing or rhonchi  ABDOMEN: Soft, non-tender, non-distended MUSCULOSKELETAL:  No edema; No deformity  SKIN: Warm and dry NEUROLOGIC:  Alert and oriented x 3 PSYCHIATRIC:  Normal affect   ASSESSMENT:    No diagnosis found. PLAN:    In order of problems listed above:  ***      {Are you ordering a CV Procedure (e.g. stress test, cath, DCCV, TEE, etc)?  Press F2        :[id number]}    Medication Adjustments/Labs and Tests Ordered: Current medicines are reviewed at length with the patient today.  Concerns regarding medicines are outlined above.  No orders of the defined types were placed in this encounter.  No orders of the defined types were placed in this encounter.   There are no Patient Instructions on file for this visit.   Signed, Larance Ratledge [place], MD  08/12/2023 8:08 AM    [facility]

## 2023-08-15 ENCOUNTER — Ambulatory Visit: Payer: Medicare Other | Attending: Cardiology | Admitting: Cardiology

## 2023-08-16 ENCOUNTER — Other Ambulatory Visit: Payer: Self-pay

## 2023-08-16 ENCOUNTER — Encounter (HOSPITAL_COMMUNITY)
Admission: RE | Admit: 2023-08-16 | Discharge: 2023-08-16 | Disposition: A | Discharge status: Home / Self Care | Payer: Medicare Other | Source: Ambulatory Visit

## 2023-08-16 ENCOUNTER — Other Ambulatory Visit: Payer: Self-pay | Admitting: Nurse Practitioner

## 2023-08-16 ENCOUNTER — Telehealth: Payer: Self-pay | Admitting: *Deleted

## 2023-08-16 DIAGNOSIS — G893 Neoplasm related pain (acute) (chronic): Secondary | ICD-10-CM

## 2023-08-16 DIAGNOSIS — Z515 Encounter for palliative care: Secondary | ICD-10-CM

## 2023-08-16 DIAGNOSIS — C61 Malignant neoplasm of prostate: Secondary | ICD-10-CM

## 2023-08-16 MED ORDER — OXYCODONE HCL 5 MG PO TABS: 5.0000 mg | ORAL_TABLET | ORAL | 0 refills | Status: DC | PRN

## 2023-08-16 MED ORDER — FENTANYL 50 MCG/HR TD PT72: 1.0000 | MEDICATED_PATCH | TRANSDERMAL | 0 refills | Status: DC

## 2023-08-16 NOTE — Progress Notes (Signed)
Order placed per family request to hospice services with [facility]. See associated orders.

## 2023-08-16 NOTE — Progress Notes (Signed)
Orders for hospice services canceled per family request.

## 2023-08-16 NOTE — Telephone Encounter (Signed)
[name] from [facility] states Nathan Berg called them requesting Hospice services. [name] is calling for a referral.

## 2023-08-16 NOTE — Progress Notes (Signed)
Called and spoke to patient and his wife regarding call from [name] for hospice referral.  Patient reports he is not ready for hospice. Report his pain meds were discontinued on discharge. Wife was very upset it occurred and his pain is uncontrolled. She complained does not like [facility] [address] [facility] and not being treated well.  Report he was not taking extra and only 1 oxycodone at a time and rarely need 2. He has been using fentanyl patch without confusion. Discussed I can refill both as he needs them for pain control.   Also discussed hospice vs palliative care and the difference. Arlen said he is not ready for hospice. We discussed Pluvicto treatment is palliative and not curative. Study showed PFS of about 8 months. This means average patients will progress about that time frame. This is likely his last treatment option given he could not tolerated chemotherapy. Therefore, electing hospice is also a reasonable option at this time if his QoL is not maintained with current treatment. He has appointment with nuclear medicine on 2/25 for Pluvicto and with [place] on 2/17. Discussed we may follow up with PSA level and assess each month. If signs of rapid progression or if he elects to stop treatment, transition to hospice is reasonable.  Refilled oxycodone and fentanyl [facility] to his [facility].

## 2023-08-19 NOTE — Progress Notes (Deleted)
 Palliative Medicine [facility]  [phone] [phone]   Name: Nathan Berg Date: 08/19/2023 MRN: [id number]  DOB: [birth year]  Patient Care Team: Malachy Mood, MD as PCP - General (Hematology) Malachy Mood, MD as Attending Physician (Hematology and Oncology) Barbie Banner, MD (Family Medicine) Pickenpack-Cousar, Arty Baumgartner, NP as Nurse Practitioner [facility] and Palliative Medicine)    INTERVAL HISTORY: Nathan Berg is a 68 y.o. 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 Palliative ask to see for symptom management and goals of care.     SOCIAL HISTORY:    Nathan Berg reports that he has never smoked. He has never used smokeless tobacco. He reports current alcohol use. He reports that he does not use drugs.  ADVANCE DIRECTIVES:  DNR/MOST, HCPOA, Living Will on file  CODE STATUS: DNR  PAST MEDICAL HISTORY: Past Medical History:  Diagnosis Date  . Anxiety   . Arthritis   . Cancer [facility])    Prostate cancer  . Depression   . GERD (gastroesophageal reflux disease)   . H/O seasonal allergies   . History of kidney stones    x1  . Prostate cancer (HCC)     ALLERGIES:  is allergic to codeine and penicillin g.  MEDICATIONS:  Current Outpatient Medications  Medication Sig Dispense Refill  . acetaminophen (TYLENOL) 325 MG tablet Take 2 tablets (650 mg total) by mouth every 4 (four) hours as needed for mild pain (pain score 1-3) (or temp > 37.5 C (99.5 F)). 20 tablet 0  . ALPRAZolam (XANAX) 0.5 MG tablet Take 0.5 mg by mouth daily as needed for anxiety.    Marland Kitchen aspirin EC 81 MG tablet Take 1 tablet (81 mg total) by mouth daily. Swallow whole. 30 tablet 12  . atorvastatin (LIPITOR) 40 MG tablet Take 1 tablet (40 mg total) by mouth daily. 30 tablet 0  . fentaNYL (DURAGESIC) 50 MCG/HR Place 1 patch  onto the skin every 3 (three) days. 10 patch 0  . gabapentin (NEURONTIN) 300 MG capsule TAKE 3 CAPSULES BY MOUTH THREE TIMES DAILY (MORNING, AFTERNOON, NIGHTTIME) 270 capsule 0  . ibuprofen (ADVIL) 200 MG tablet Take 600 mg by mouth as needed for mild pain (pain score 1-3) or moderate pain (pain score 4-6).    Marland Kitchen oxyCODONE (OXY IR/ROXICODONE) 5 MG immediate release tablet Take 1-2 tablets (5-10 mg total) by mouth every 4 (four) hours as needed for severe pain (pain score 7-10) or breakthrough pain. 90 tablet 0  . pantoprazole (PROTONIX) 40 MG tablet Take 1 tablet (40 mg total) by mouth 2 (two) times daily before a meal. (Patient taking differently: Take 40 mg by mouth daily as needed (heartburn).) 60 tablet 1  . senna-docusate (SENOKOT-S) 8.6-50 MG tablet Take 1 tablet by mouth at bedtime as needed for mild constipation. 30 tablet 0   No current facility-administered medications for this visit.    VITAL SIGNS: There were no vitals taken for this visit. There were no vitals filed for this visit.  Estimated body mass index is 24.36 kg/m as calculated from the following:   Height as of 08/08/23: 5\' 10"  (1.778 m).   Weight as of 08/08/23: 169 lb 12.1 oz (77 kg).   PERFORMANCE STATUS (ECOG) : 1 - Symptomatic but completely ambulatory   Physical Exam General: NAD Cardiovascular: regular rate and rhythm Pulmonary: regular, even, unlabored  Abdomen: soft, non-tender Extremities: right thumb with mild edema and tenderness Psychological: A/O x 4, restricted affect  IMPRESSION:  Nathan Berg presents to clinic for follow-up s/p recent hospitalization. It has been several months since previous encounter as patient without palliative needs until most recently with uncontrolled pain. No family present. He is alert and able to appropriately engage in discussions. Denies nausea, vomiting, or diarrhea. Is managing his constipation with daily use of Miralax and Senna. Appetite is fair. Some days are better  than others.   Nathan Berg is complaining of persistent and severe chest wall and arm pain, particularly at night. The pain is localized under the left arm and has been consistent. The pain is so severe that it often leads to moaning and groaning for hours when attempting to sleep at night. He has found some relief by delaying bedtime until about 1 AM allowing for medication use.   The patient confirms current regimen consist of gabapentin, Advil, Robaxin and oxycodone for pain management, as well as a fentanyl patch. His spouse manages the medication. Despite this regimen, Nathan Berg reports that the pain is often at a level of 10 on a scale of 0 to 10. The pain may decrease to around 8.5 or 9 when lying in bed, and possibly to a 5 when the patient is able to fall asleep.  Nathan Berg reports that he is unsure whether the fentanyl patch is effective but continues to use it out of concern that the pain might be worse without it. After extensive discussions he is not taking his oxycodone as often as he could for his severe pain. Education provided on use of 1-2 tablets every 4 hours as needed for severe pain. He understands we will plan to continue close monitoring and further adjust regimen as needed. If no improvement will plan to increase Fentanyl patch at follow-up.   The patient is currently undergoing radiation treatment, with only a few doses remaining. The patient hopes that the completion of this treatment will provide some relief from the pain.  All questions answered and support provided.  Goals of Care: We continued discussion regarding Nathan Berg's current illness and what it means in the larger context of his on-going co-morbidities. Natural disease trajectory and expectations were discussed.   Nathan Berg is clear in his expressed wishes to continue to treat the treatable. He is taking life one day at a time.   Nathan Berg completed HCPOA and Living Will documents at [facility] earlier this  week.  We discussed the importance of continued conversation with family and their medical providers regarding overall plan of care and treatment options, ensuring decisions are within the context of the patients values and GOCs.   Assessment and Plan  Cancer-related Pain Severe pain (10/10) in the chest and arm, not well controlled with current regimen (Fentanyl patch, Gabapentin, Advil, Oxycodone, Robaxin). Patient reports some relief with sleep and medication, but still experiences significant pain. -Increase Oxycodone to 2 tablets every 4 hours as needed for pain. -Consider adjusting Fentanyl patch dosage if no improvement with increased Oxycodone. -Continue Gabapentin twice daily, Robaxin, and Advil as prescribed. -Check in on 07/25/2023 to assess pain control and make further adjustments if necessary.  Bone Health Receiving Zometa infusions for bone health related to cancer treatment. -Continue Zometa infusions as scheduled.  Mental Health Taking Lexapro and Xanax for anxiety/depression. -Continue Lexapro and Xanax as prescribed.  Follow-up Next appointment scheduled for 08/10/2023. If pain is not controlled or worsens, patient  to contact provider sooner.  Patient expressed understanding and was in agreement with this plan. He also understands that He can call the clinic at any time with any questions, concerns, or complaints.   Any controlled substances utilized were prescribed in the context of palliative care. PDMP has been reviewed.   Willette Alma, AGPCNP-BC  Palliative Medicine Team/[facility]

## 2023-08-19 NOTE — Assessment & Plan Note (Addendum)
 Nathan Berg elected hospice today.  Referral to hospice. He has seen Trelis already.

## 2023-08-19 NOTE — Progress Notes (Signed)
 Patient Care Team: Malachy Mood, MD as PCP - General (Hematology) Malachy Mood, MD as Attending Physician (Hematology and Oncology) Barbie Banner, MD (Family Medicine) Pickenpack-Cousar, Arty Baumgartner, NP as Nurse Practitioner [facility] and Palliative Medicine)  Clinic Day:  08/22/2023  Referring physician: Malachy Mood, MD  ASSESSMENT & PLAN:   Assessment & Plan: 68 y.o. man with history of GERD, prostate cancer being follow-up through telephone visit Berg.  Patient reported he did not remember he has appointment in person Berg so changed to telephone visit.    Patient location: at home Physician office: [facility] in [place]  Last week I called him to see how he is doing and discussed GOC.  Reports his pain medication was discontinued from hosptial.  I have refilled his fentanyl patch and short acting oxycodone.  He was able to get it last week.  We discussed the goals of care again Berg.  We went over the data from Pluvicto again.  We discussed palliative nature of the treatment again. He could not tolerate docetaxel well even at lower dose. He reports no improvement from Pluvicto, though only received one dose. Treatment is meant to control the disease, delay progression, and in general people will progress eventually.  He expressed Berg he understands that treatment will only extend a few months of his life vs not. He would like to proceed with hospice care. We discussed again that is reasonable to focus on comfort measure, symptom control.  This means that we will stop all cancer directed treatment including Pluvicto.  He would like to cancel all appointment for now.  If he were to have improvement of status in a few months, he will call us back and repeat PSA and testing.  He will proceed with hospice now.   Current diagnosis: mCRPC Germline: performed. Pending Somatic: ATM. TP53. TMB 8.57m/MB Initial diagnosis: 2015. Gleason score 4+3 = 7 PSA of 9.1 Previous treatment:  s/p robotic  assisted laparoscopic radical prostatectomy and bilateral lymph node dissection in March 2015  ADT, enzalutamide, olaparib 2015 salvage radiation with 6 months of ADT Radiation Right iliac crest and the right internal iliac lymph node, T spine Current treatment: Pluvicto started in Dec 2024  Prostate cancer [facility]) Nathan Berg.  Referral to hospice. He has seen Trelis already.  Cancer related pain Fentanyl patch 50 mcg. Refilled last week Oxycodone as needed  Will defer future pain management to hospice from now on.   All appointment cancellation requested. The patient understands the plans discussed Berg and is in agreement with them.  He knows to contact our office if he has new changes in plan.  Total time 28 minutes.  Melven Sartorius, MD  [facility] - [facility] DEPT OF [facility] [address] [place] [address] Dept: [phone] Dept Fax: [phone]    CHIEF COMPLAINT:  CC: mCRPC  Current Treatment:  Pluvicto  INTERVAL HISTORY:  Nathan Berg is being follow-up through the phone call Berg.  He said he could not see his appointment as of last night and thought he does not have any appointment in person. Overall he is stable. He was able to get his pain medication last week.   I have reviewed the past medical history, past surgical history, social history and family history with the patient and they are unchanged from previous note.  ALLERGIES:  is allergic to codeine and penicillin g.  MEDICATIONS:  Current  Outpatient Medications  Medication Sig Dispense Refill   acetaminophen (TYLENOL) 325 MG tablet Take 2 tablets (650 mg total) by mouth every 4 (four) hours as needed for mild pain (pain score 1-3) (or temp > 37.5 C (99.5 F)). 20 tablet 0   ALPRAZolam (XANAX) 0.5 MG tablet Take 0.5 mg by mouth daily as needed for anxiety.     aspirin EC 81 MG tablet Take 1 tablet (81 mg total) by mouth daily.  Swallow whole. 30 tablet 12   atorvastatin (LIPITOR) 40 MG tablet Take 1 tablet (40 mg total) by mouth daily. 30 tablet 0   fentaNYL (DURAGESIC) 50 MCG/HR Place 1 patch onto the skin every 3 (three) days. 10 patch 0   gabapentin (NEURONTIN) 300 MG capsule TAKE 3 CAPSULES BY MOUTH THREE TIMES DAILY (MORNING, AFTERNOON, NIGHTTIME) 270 capsule 0   ibuprofen (ADVIL) 200 MG tablet Take 600 mg by mouth as needed for mild pain (pain score 1-3) or moderate pain (pain score 4-6).     oxyCODONE (OXY IR/ROXICODONE) 5 MG immediate release tablet Take 1-2 tablets (5-10 mg total) by mouth every 4 (four) hours as needed for severe pain (pain score 7-10) or breakthrough pain. 90 tablet 0   pantoprazole (PROTONIX) 40 MG tablet Take 1 tablet (40 mg total) by mouth 2 (two) times daily before a meal. (Patient taking differently: Take 40 mg by mouth daily as needed (heartburn).) 60 tablet 1   senna-docusate (SENOKOT-S) 8.6-50 MG tablet Take 1 tablet by mouth at bedtime as needed for mild constipation. 30 tablet 0   No current facility-administered medications for this visit.    HISTORY OF PRESENT ILLNESS:   Oncology History Overview Note   Cancer Staging  Malignant neoplasm of prostate (HCC) Staging form: Prostate, AJCC 7th Edition - Clinical: Stage IV (TX, NX, M1, PSA: Less than 10, Gleason 8-10) - Signed by Benjiman Core, MD on 04/30/2021     Prostate cancer (HCC)  09/19/2013 Initial Diagnosis   Malignant neoplasm of prostate (HCC)   04/30/2021 Cancer Staging   Staging form: Prostate, AJCC 7th Edition - Clinical: Stage IV (TX, NX, M1, PSA: Less than 10, Gleason 8-10) - Signed by Benjiman Core, MD on 04/30/2021   03/11/2022 Imaging    IMPRESSION: 1. Multifocal intense radiotracer avid skeletal metastasis again noted involving the axillary skeleton. No change in pattern or number of lesions. No new lesions identified. 2. The radiotracer activity of the lesions; however, is uniformly increased from  comparison exam. 3. The sclerotic pattern of the lesions on comparison CT is a similar. One lesion at T3 as new lytic component. 4. No visceral metastasis.  No new nodal disease.     12/09/2022 PET scan    IMPRESSION: 1. 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 2. Metastatic skeletal lesion in the RIGHT iliac wing is increased in size compared to prior. 3. Other than the 2 above lesions, the pattern and intensity of multifocal radiotracer avid skeletal metastasis is unchanged. No new lesions identified. 4. No evidence of nodal metastasis or visceral metastasis.     03/09/2023 Tumor Marker   PSA 25   04/19/2023 - 05/02/2023 Radiation Therapy   SBRT T spine 40 Gy/5 fx   05/04/2023 PET scan   PSMA PET 1. Interval increase in size and degree of tracer uptake associated with multifocal bone metastases. 2. New tracer avid nodule within the medial right upper lobe is compatible with pulmonary metastasis. 3.  Additional small scattered lung nodules are identified which are too small to characterize by PET-CT but appear new from the previous exam. 4.  Aortic Atherosclerosis (ICD10-I70.0).   05/04/2023 Tumor Marker   PSA 44.3   05/18/2023 - 05/18/2023 Chemotherapy   Patient is on Treatment Plan : PROSTATE Docetaxel (50) + Prednisone q14d      Genetic Testing   [facility] CancerNext-Expanded Panel+RNA was Negative. Report date is 05/26/2023.   The CancerNext-Expanded gene panel offered by [facility] and includes sequencing, rearrangement, and RNA analysis for the following 71 genes: AIP, ALK, APC, ATM, AXIN2, BAP1, BARD1, BMPR1A, BRCA1, BRCA2, BRIP1, CDC73, CDH1, CDK4, CDKN1B, CDKN2A, CHEK2, CTNNA1, DICER1, FH, FLCN, KIF1B, LZTR1, MAX, MEN1, MET, MLH1, MSH2, MSH3, MSH6, MUTYH, NF1, NF2, NTHL1, PALB2, PHOX2B, PMS2, POT1, PRKAR1A, PTCH1, PTEN, RAD51C, RAD51D, RB1, RET, SDHA, SDHAF2, SDHB, SDHC, SDHD, SMAD4, SMARCA4,  SMARCB1, SMARCE1, STK11, SUFU, TMEM127, TP53, TSC1, TSC2, and VHL (sequencing and deletion/duplication); EGFR, EGLN1, HOXB13, KIT, MITF, PDGFRA, POLD1, and POLE (sequencing only); EPCAM and GREM1 (deletion/duplication only).         REVIEW OF SYSTEMS:   All relevant systems were reviewed with the patient and are negative.   VITALS:  There were no vitals taken for this visit.  Wt Readings from Last 3 Encounters:  08/08/23 169 lb 12.1 oz (77 kg)  07/21/23 168 lb 12.8 oz (76.6 kg)  07/10/23 186 lb 4.6 oz (84.5 kg)    There is no height or weight on file to calculate BMI.  Performance status (ECOG): 2 - Symptomatic, <50% confined to bed  PHYSICAL EXAM:   NA  LABORATORY DATA:  I have reviewed the data as listed    Component Value Date/Time   NA 140 08/10/2023 0616   K 4.2 08/10/2023 0616   CL 106 08/10/2023 0616   CO2 27 08/10/2023 0616   GLUCOSE 103 (H) 08/10/2023 0616   BUN 9 08/10/2023 0616   CREATININE 0.67 08/10/2023 0616   CREATININE 0.60 (L) 07/21/2023 1216   CALCIUM 8.1 (L) 08/10/2023 0616   PROT 5.8 (L) 08/08/2023 1507   ALBUMIN 2.8 (L) 08/08/2023 1507   AST 20 08/08/2023 1507   AST 18 07/21/2023 1216   ALT 22 08/08/2023 1507   ALT 24 07/21/2023 1216   ALKPHOS 69 08/08/2023 1507   BILITOT 0.4 08/08/2023 1507   BILITOT 0.3 07/21/2023 1216   GFRNONAA >60 08/10/2023 0616   GFRNONAA >60 07/21/2023 1216   GFRAA >60 07/17/2019 0322    No results found for: "SPEP", "UPEP"  Lab Results  Component Value Date   WBC 2.5 (L) 08/10/2023   NEUTROABS 1.7 08/08/2023   HGB 8.9 (L) 08/10/2023   HCT 29.1 (L) 08/10/2023   MCV 81.3 08/10/2023   PLT 212 08/10/2023      Chemistry      Component Value Date/Time   NA 140 08/10/2023 0616   K 4.2 08/10/2023 0616   CL 106 08/10/2023 0616   CO2 27 08/10/2023 0616   BUN 9 08/10/2023 0616   CREATININE 0.67 08/10/2023 0616   CREATININE 0.60 (L) 07/21/2023 1216      Component Value Date/Time   CALCIUM 8.1 (L) 08/10/2023  0616   ALKPHOS 69 08/08/2023 1507   AST 20 08/08/2023 1507   AST 18 07/21/2023 1216   ALT 22 08/08/2023 1507   ALT 24 07/21/2023 1216   BILITOT 0.4 08/08/2023 1507   BILITOT 0.3 07/21/2023 1216       RADIOGRAPHIC STUDIES: I have personally reviewed  the radiological images as listed and agreed with the findings in the report. [place] CHEST (PLEURAL EFFUSION) Result Date: 08/09/2023 CLINICAL DATA:  Patient admitted with CVA. Pleural effusion noted on imaging workup. Request for possible thoracentesis. EXAM: CHEST ULTRASOUND COMPARISON:  None Available. FINDINGS: Limited ultrasound right chest demonstrates small pleural effusion with visible lung flap. This is insufficient for safe thoracentesis. IMPRESSION: Small pleural effusion. Procedure was discussed with the patient who wishes to defer procedure at this time. Imaging performed by Brayton El PA-C and supervised by Dr. Irish Lack Electronically Signed   By: Irish Lack M.D.   On: 08/09/2023 14:43   VAS [place] LOWER EXTREMITY VENOUS (DVT) Result Date: 08/09/2023  Lower Venous DVT Study Patient Name:  KORIE BRABSON  Date of Exam:   08/09/2023 Medical Rec #: [id number]       Accession #:    [id number] Date of Birth: 1956-03-31        Patient Gender: M Patient Age:   43 years Exam Location:  [facility] Procedure:      VAS [place] LOWER EXTREMITY VENOUS (DVT) Referring Phys: Pamella Pert --------------------------------------------------------------------------------  Indications: Swelling.  Risk Factors: None identified. Limitations: Poor ultrasound/tissue interface. Comparison Study: No prior studies. Performing Technologist: Chanda Busing RVT  Examination Guidelines: A complete evaluation includes B-mode imaging, spectral Doppler, color Doppler, and power Doppler as needed of all accessible portions of each vessel. Bilateral testing is considered an integral part of a complete examination. Limited examinations for reoccurring indications may be  performed as noted. The reflux portion of the exam is performed with the patient in reverse Trendelenburg.  +---------+---------------+---------+-----------+----------+-------------------+ RIGHT    CompressibilityPhasicitySpontaneityPropertiesThrombus Aging      +---------+---------------+---------+-----------+----------+-------------------+ CFV      Full           Yes      Yes                                      +---------+---------------+---------+-----------+----------+-------------------+ SFJ      Full                                                             +---------+---------------+---------+-----------+----------+-------------------+ FV Prox  Full                                                             +---------+---------------+---------+-----------+----------+-------------------+ FV Mid   Full                                                             +---------+---------------+---------+-----------+----------+-------------------+ FV DistalFull                                                             +---------+---------------+---------+-----------+----------+-------------------+  PFV      Full                                                             +---------+---------------+---------+-----------+----------+-------------------+ POP      Full           Yes      Yes                                      +---------+---------------+---------+-----------+----------+-------------------+ PTV      Full                                                             +---------+---------------+---------+-----------+----------+-------------------+ PERO                                                  Not well visualized +---------+---------------+---------+-----------+----------+-------------------+   +---------+---------------+---------+-----------+----------+-------------------+ LEFT      CompressibilityPhasicitySpontaneityPropertiesThrombus Aging      +---------+---------------+---------+-----------+----------+-------------------+ CFV      Full           Yes      Yes                                      +---------+---------------+---------+-----------+----------+-------------------+ SFJ      Full                                                             +---------+---------------+---------+-----------+----------+-------------------+ FV Prox  Full                                                             +---------+---------------+---------+-----------+----------+-------------------+ FV Mid   Full                                                             +---------+---------------+---------+-----------+----------+-------------------+ FV DistalFull                                                             +---------+---------------+---------+-----------+----------+-------------------+ PFV      Full                                                             +---------+---------------+---------+-----------+----------+-------------------+  POP      Full           Yes      Yes                                      +---------+---------------+---------+-----------+----------+-------------------+ PTV      Full                                                             +---------+---------------+---------+-----------+----------+-------------------+ PERO                                                  Not well visualized +---------+---------------+---------+-----------+----------+-------------------+     Summary: RIGHT: - There is no evidence of deep vein thrombosis in the lower extremity. However, portions of this examination were limited- see technologist comments above.  - No cystic structure found in the popliteal fossa.  LEFT: - There is no evidence of deep vein thrombosis in the lower extremity. However, portions of this  examination were limited- see technologist comments above.  - No cystic structure found in the popliteal fossa.  *See table(s) above for measurements and observations. Electronically signed by Carolynn Sayers on 08/09/2023 at 1:57:46 PM.    Final    ECHOCARDIOGRAM COMPLETE Result Date: 08/09/2023    ECHOCARDIOGRAM REPORT   Patient Name:   Hillman LEYLAND KENNA Date of Exam: 08/09/2023 Medical Rec #:  [id number]      Height:       70.0 in Accession #:    [id number]     Weight:       169.8 lb Date of Birth:  Mar 08, 1956       BSA:          1.947 m Patient Age:    67 years       BP:           116/96 mmHg Patient Gender: M              HR:           65 bpm. Exam Location:  Inpatient Procedure: 2D Echo, Cardiac Doppler and Color Doppler Indications:    Stroke I63.9  History:        Patient has no prior history of Echocardiogram examinations.  Sonographer:    Darlys Gales Referring Phys: [id number] VISHAL R PATEL  Sonographer Comments: Suboptimal parasternal window. Image acquisition challenging due to respiratory motion. IMPRESSIONS  1. Left ventricular ejection fraction, by estimation, is 65 to 70%. The left ventricle has normal function. The left ventricle has no regional wall motion abnormalities. Left ventricular diastolic parameters were normal.  2. Right ventricular systolic function is normal. The right ventricular size is normal. Tricuspid regurgitation signal is inadequate for assessing PA pressure.  3. Left atrial size was mildly dilated.  4. The mitral valve is normal in structure. No evidence of mitral valve regurgitation. No evidence of mitral stenosis. Moderate mitral annular calcification.  5. The aortic valve has an indeterminant number of cusps. There is moderate calcification of the aortic valve. There is moderate thickening of the  aortic valve. Aortic valve regurgitation is trivial. Moderate to severe aortic valve stenosis.  6. The inferior vena cava is normal in size with greater than 50% respiratory variability,  suggesting right atrial pressure of 3 mmHg. Conclusion(s)/Recommendation(s): No intracardiac source of embolism detected on this transthoracic study. Consider a transesophageal echocardiogram to exclude cardiac source of embolism if clinically indicated. FINDINGS  Left Ventricle: Left ventricular ejection fraction, by estimation, is 65 to 70%. The left ventricle has normal function. The left ventricle has no regional wall motion abnormalities. The left ventricular internal cavity size was normal in size. There is  no left ventricular hypertrophy. Left ventricular diastolic parameters were normal. Right Ventricle: The right ventricular size is normal. Right vetricular wall thickness was not well visualized. Right ventricular systolic function is normal. Tricuspid regurgitation signal is inadequate for assessing PA pressure. Left Atrium: Left atrial size was mildly dilated. Right Atrium: Right atrial size was normal in size. Pericardium: There is no evidence of pericardial effusion. Mitral Valve: The mitral valve is normal in structure. Moderate mitral annular calcification. No evidence of mitral valve regurgitation. No evidence of mitral valve stenosis. Tricuspid Valve: The tricuspid valve is grossly normal. Tricuspid valve regurgitation is not demonstrated. Aortic Valve: The aortic valve has an indeterminant number of cusps. There is moderate calcification of the aortic valve. There is moderate thickening of the aortic valve. Aortic valve regurgitation is trivial. Aortic regurgitation PHT measures 530 msec.  Moderate to severe aortic stenosis is present. Aortic valve mean gradient measures 30.7 mmHg. Aortic valve peak gradient measures 47.1 mmHg. Aortic valve area, by VTI measures 1.19 cm. Pulmonic Valve: The pulmonic valve was not well visualized. Pulmonic valve regurgitation is not visualized. No evidence of pulmonic stenosis. Aorta: The aortic root is normal in size and structure. Venous: The inferior vena cava  is normal in size with greater than 50% respiratory variability, suggesting right atrial pressure of 3 mmHg. IAS/Shunts: The interatrial septum was not well visualized.  LEFT VENTRICLE PLAX 2D LVIDd:         4.50 cm   Diastology LVIDs:         2.70 cm   LV e' medial:    5.55 cm/s LV PW:         1.10 cm   LV E/e' medial:  13.0 LV IVS:        0.70 cm   LV e' lateral:   9.36 cm/s LVOT diam:     2.29 cm   LV E/e' lateral: 7.7 LV SV:         88 LV SV Index:   45 LVOT Area:     4.12 cm  RIGHT VENTRICLE RV S prime:     14.00 cm/s TAPSE (M-mode): 2.8 cm LEFT ATRIUM             Index        RIGHT ATRIUM           Index LA Vol (A2C):   48.7 ml 25.02 ml/m  RA Area:     12.60 cm LA Vol (A4C):   45.7 ml 23.47 ml/m  RA Volume:   24.30 ml  12.48 ml/m LA Biplane Vol: 48.0 ml 24.66 ml/m  AORTIC VALVE AV Area (Vmax):    1.10 cm AV Area (Vmean):   1.21 cm AV Area (VTI):     1.19 cm AV Vmax:           343.25 cm/s AV Vmean:  267.333 cm/s AV VTI:            0.740 m AV Peak Grad:      47.1 mmHg AV Mean Grad:      30.7 mmHg LVOT Vmax:         92.00 cm/s LVOT Vmean:        78.800 cm/s LVOT VTI:          0.214 m LVOT/AV VTI ratio: 0.29 AI PHT:            530 msec MITRAL VALVE MV Area (PHT): 3.46 cm    SHUNTS MV Decel Time: 219 msec    Systemic VTI:  0.21 m MV E velocity: 72.00 cm/s  Systemic Diam: 2.29 cm MV A velocity: 62.10 cm/s MV E/A ratio:  1.16 Mihai Croitoru MD Electronically signed by Thurmon Fair MD Signature Date/Time: 08/09/2023/11:10:02 AM    Final    CT ANGIO HEAD NECK W WO CM Result Date: 08/09/2023 CLINICAL DATA:  Initial evaluation for acute stroke. EXAM: CT ANGIOGRAPHY HEAD AND NECK WITH AND WITHOUT CONTRAST TECHNIQUE: Multidetector CT imaging of the head and neck was performed using the standard protocol during bolus administration of intravenous contrast. Multiplanar CT image reconstructions and MIPs were obtained to evaluate the vascular anatomy. Carotid stenosis measurements (when applicable) are  obtained utilizing NASCET criteria, using the distal internal carotid diameter as the denominator. RADIATION DOSE REDUCTION: This exam was performed according to the departmental dose-optimization program which includes automated exposure control, adjustment of the mA and/or kV according to patient size and/or use of iterative reconstruction technique. CONTRAST:  OMNIPAQUE IOHEXOL 350 MG/ML SOLN COMPARISON:  CT and MRI from earlier the same day. Comparison also made with recent spinal MRI from 07/10/2023. FINDINGS: CTA NECK FINDINGS Aortic arch: Visualized aortic arch within normal limits for caliber with standard branch pattern. No significant stenosis or other abnormality about the origin of the great vessels. Mild aortic atherosclerosis. Right carotid system: Of right common and internal carotid arteries are tortuous with patent without dissection. Mild atheromatous change about the right carotid bulb without hemodynamically significant stenosis. Left carotid system: Left common and internal carotid arteries are tortuous but patent without dissection. Mild-to-moderate atheromatous change about the left carotid bulb without hemodynamically significant stenosis. Vertebral arteries: Both vertebral arteries arise from subclavian arteries. Atheromatous change about the origin of the left vertebral artery with mild-to-moderate stenosis. Vertebral arteries tortuous but patent distally without stenosis or dissection. Skeleton: Severe pathologic compression fracture with near vertebral plana formation at T3 noted. Additional mild-to-moderate pathologic compression fractures involving the T2 and T4 vertebral bodies noted as well. Appearance is similar to prior MRI. Underlying cervical spondylosis, most pronounced at C5-6 and C6-7. Other neck: No other acute finding. Upper chest: Moderate to large layering bilateral pleural effusions. Multiple scattered bilateral pulmonary nodules present, consistent with metastatic  disease. Large is visible discrete nodule present within the left upper lobe in measures up to 1.9 cm. Few scattered enlarged mediastinal lymph nodes measuring up to 1.7 cm noted, possibly reflecting nodal metastases. Review of the MIP images confirms the above findings CTA HEAD FINDINGS Anterior circulation: Mild atheromatous change about the carotid siphons without stenosis. A1 segments, anterior communicating artery complex, and anterior cerebral arteries patent without stenosis. Hypoplastic right A1 segment noted. No M1 stenosis or occlusion. Distal MCA branches perfused and symmetric. Posterior circulation: Both V4 segments patent without stenosis. Left PICA patent. Right PICA not well seen. Basilar patent without stenosis. Superior cerebellar and posterior cerebral arteries  patent bilaterally. Venous sinuses: Patent allowing for timing the contrast bolus. Anatomic variants: As above.  No aneurysm. Review of the MIP images confirms the above findings IMPRESSION: 1. Negative CTA for large vessel occlusion or other emergent finding. 2. Atheromatous change about the origin of the left vertebral artery with associated mild-to-moderate stenosis. 3. Multiple scattered bilateral pulmonary nodules, consistent with metastatic disease. Few scattered enlarged mediastinal lymph nodes as above, also possibly metastatic in nature. 4. Moderate to large layering bilateral pleural effusions. 5. Osseous metastatic disease with associated pathologic compression fractures of T2 through T4. Electronically Signed   By: Rise Mu M.D.   On: 08/09/2023 00:32   MR Brain W and Wo Contrast Result Date: 08/08/2023 CLINICAL DATA:  Transient ischemic attack EXAM: MRI HEAD WITHOUT AND WITH CONTRAST TECHNIQUE: Multiplanar, multiecho pulse sequences of the brain and surrounding structures were obtained without and with intravenous contrast. CONTRAST:  8mL GADAVIST GADOBUTROL 1 MMOL/ML IV SOLN COMPARISON:  None Available. FINDINGS:  Brain: There are punctate foci of abnormal diffusion restriction within the right temporal lobe in foci of diffusion weighted hyperintensity in the right occipital lobe without a clear ADC correlate. These likely indicating combination of early and late subacute infarcts. No acute or chronic hemorrhage. There is multifocal hyperintense T2-weighted signal within the white matter. Generalized volume loss. The midline structures are normal. Thickened appearance of the right tentorial leaflet. Vascular: Normal flow voids. Skull and upper cervical spine: Normal calvarium and skull base. Visualized upper cervical spine and soft tissues are normal. Sinuses/Orbits:No paranasal sinus fluid levels or advanced mucosal thickening. No mastoid or middle ear effusion. Normal orbits. 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 No hemorrhage or mass effect. Electronically Signed   By: Deatra Robinson M.D.   On: 08/08/2023 20:52   CT Head Wo Contrast Result Date: 08/08/2023 CLINICAL DATA:  Mental status change, unknown cause EXAM: CT HEAD WITHOUT CONTRAST TECHNIQUE: Contiguous axial images were obtained from the base of the skull through the vertex without intravenous contrast. RADIATION DOSE REDUCTION: This exam was performed according to the departmental dose-optimization program which includes automated exposure control, adjustment of the mA and/or kV according to patient size and/or use of iterative reconstruction technique. COMPARISON:  None Available. FINDINGS: Brain: No evidence of acute infarction, hemorrhage, hydrocephalus, extra-axial collection or mass lesion/mass effect. Patchy white matter hypodensities, nonspecific but compatible with chronic microvascular ischemic disease. Vascular: No hyperdense vessel. Skull: No acute fracture. Sinuses/Orbits: Clear sinuses.  No acute orbital findings. Other: No mastoid effusions. IMPRESSION: No evidence of acute intracranial  abnormality. Electronically Signed   By: Feliberto Harts M.D.   On: 08/08/2023 18:10

## 2023-08-19 NOTE — Assessment & Plan Note (Addendum)
 Fentanyl patch 50 mcg. Refilled last week Oxycodone as needed  Will defer future pain management to hospice from now on.

## 2023-08-22 ENCOUNTER — Inpatient Hospital Stay: Payer: Medicare Other

## 2023-08-22 ENCOUNTER — Inpatient Hospital Stay: Payer: Medicare Other | Admitting: Nurse Practitioner

## 2023-08-22 ENCOUNTER — Inpatient Hospital Stay: Payer: Medicare Other | Attending: Oncology

## 2023-08-22 DIAGNOSIS — C61 Malignant neoplasm of prostate: Secondary | ICD-10-CM | POA: Diagnosis not present

## 2023-08-22 DIAGNOSIS — G893 Neoplasm related pain (acute) (chronic): Secondary | ICD-10-CM

## 2023-08-23 ENCOUNTER — Ambulatory Visit
Admission: RE | Admit: 2023-08-23 | Discharge: 2023-08-23 | Disposition: A | Discharge status: Home / Self Care | Payer: Medicare Other | Source: Ambulatory Visit | Attending: Hematology | Admitting: Hematology

## 2023-08-23 NOTE — Progress Notes (Signed)
  Radiation Oncology         [phone]) [phone] ________________________________  Name: Nathan Berg MRN: [id number]  Date of Service: 08/23/2023  DOB: 21-Jul-1955  Post Treatment Telephone Note  Diagnosis:  C79.51 Secondary malignant neoplasm of bone (as documented in provider EOT note)  The patient was not available for call today. Voicemail left.   The patient is scheduled for ongoing care with Dr. Mosetta Putt  in medical oncology. The patient was encouraged to call if he  develops concerns or questions regarding radiation.    Ruel Favors, LPN

## 2023-08-30 ENCOUNTER — Other Ambulatory Visit (HOSPITAL_COMMUNITY): Payer: Medicare Other

## 2023-09-14 ENCOUNTER — Inpatient Hospital Stay: Payer: Self-pay | Admitting: Neurology

## 2023-09-14 NOTE — Progress Notes (Deleted)
 Patient: Nathan Berg Date of Birth: 20-Nov-1955  Reason for Visit: Stroke Clinic Follow Up  History from: Patient Primary Neurologist: Pearlean Brownie    ASSESSMENT AND PLAN 68 y.o. 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  Admitted with altered mental status, most likely encephalopathy from pain medication overuse and polypharmacy.  MRI of the brain showed right temporal and occipital to tiny infarcts.  Embolic pattern concerning for hypercarbia state from advanced malignancy.  Not felt to be cause of altered mental status.  New finding of lung metastasis with large pleural effusion.  Outpatient palliative care follow-up.   HISTORY OF PRESENT ILLNESS: Today 09/14/23  HISTORY  Presented to the ER 08/09/2023 with altered mental status.  History of prostate cancer with metastasis to the bone and spinal cord compression.  Felt altered mental status most likely encephalopathy from pain medication overuse and polypharmacy.  Felt less likely from stroke given small size.  MRI of the brain showed punctate early and late subacute infarcts in right temporal and occipital lobes.  Embolic pattern, concerning for hypercarbia state from advanced malignancy.  New finding of lung metastasis with large pleural effusion.  Recommended better pain medication and psychoactive medication management.  Outpatient palliative care follow-up  -MRI of the brain punctate early and late subacute infarcts in right temporal and occipital lobes -CTA head and neck left VA mild/moderate stenosis -2D echo EF 65 to 70% -LE venous Doppler no DVT -LDL 98 -A1c 5.0 -UDS positive for opiates, benzos and THC -No antithrombotic prior to admission, started on aspirin 81 mg daily  REVIEW OF SYSTEMS: Out of a complete 14 system review of symptoms, the patient complains only of the following symptoms, and all other reviewed systems are negative.  See HPI  ALLERGIES: Allergies   Allergen Reactions   Codeine Nausea And Vomiting   Penicillin G Other (See Comments)    Stomach upset    HOME MEDICATIONS: Outpatient Medications Prior to Visit  Medication Sig Dispense Refill   acetaminophen (TYLENOL) 325 MG tablet Take 2 tablets (650 mg total) by mouth every 4 (four) hours as needed for mild pain (pain score 1-3) (or temp > 37.5 C (99.5 F)). 20 tablet 0   ALPRAZolam (XANAX) 0.5 MG tablet Take 0.5 mg by mouth daily as needed for anxiety.     aspirin EC 81 MG tablet Take 1 tablet (81 mg total) by mouth daily. Swallow whole. 30 tablet 12   atorvastatin (LIPITOR) 40 MG tablet Take 1 tablet (40 mg total) by mouth daily. 30 tablet 0   fentaNYL (DURAGESIC) 50 MCG/HR Place 1 patch onto the skin every 3 (three) days. 10 patch 0   gabapentin (NEURONTIN) 300 MG capsule TAKE 3 CAPSULES BY MOUTH THREE TIMES DAILY (MORNING, AFTERNOON, NIGHTTIME) 270 capsule 0   ibuprofen (ADVIL) 200 MG tablet Take 600 mg by mouth as needed for mild pain (pain score 1-3) or moderate pain (pain score 4-6).     oxyCODONE (OXY IR/ROXICODONE) 5 MG immediate release tablet Take 1-2 tablets (5-10 mg total) by mouth every 4 (four) hours as needed for severe pain (pain score 7-10) or breakthrough pain. 90 tablet 0   pantoprazole (PROTONIX) 40 MG tablet Take 1 tablet (40 mg total) by mouth 2 (two) times daily before a meal. (Patient taking differently: Take 40 mg by mouth daily as needed (heartburn).) 60 tablet 1   senna-docusate (SENOKOT-S) 8.6-50 MG tablet Take 1 tablet by mouth at  bedtime as needed for mild constipation. 30 tablet 0   No facility-administered medications prior to visit.    PAST MEDICAL HISTORY: Past Medical History:  Diagnosis Date   Anxiety    Arthritis    Cancer (HCC)    Prostate cancer   Depression    GERD (gastroesophageal reflux disease)    H/O seasonal allergies    History of kidney stones    x1   Prostate cancer (HCC)     PAST SURGICAL HISTORY: Past Surgical History:   Procedure Laterality Date   CHEST TUBE INSERTION  07/16/2019   trauma    COLONOSCOPY     HERNIA REPAIR Bilateral    20 yrs ago   LAMINECTOMY N/A 03/31/2023   Procedure: THORACIC LAMINECTOMY FOR TUMOR;  Surgeon: Donalee Citrin, MD;  Location: [facility] OR;  Service: Neurosurgery;  Laterality: N/A;   LYMPHADENECTOMY Bilateral 09/19/2013   Procedure: LYMPHADENECTOMY  PELVIC LYMPH NODE DISSECTION;  Surgeon: Valetta Fuller, MD;  Location: [facility] ORS;  Service: Urology;  Laterality: Bilateral;   PROSTATE BIOPSY     ROBOT ASSISTED LAPAROSCOPIC RADICAL PROSTATECTOMY N/A 09/19/2013   Procedure: ROBOTIC ASSISTED LAPAROSCOPIC RADICAL PROSTATECTOMY;  Surgeon: Valetta Fuller, MD;  Location: [facility] OR[facility];  Service: Urology;  Laterality: N/A;   TONSILLECTOMY     child   VARICOCELE EXCISION      FAMILY HISTORY: Family History  Problem Relation Age of Onset   Prostate cancer Father    Breast cancer Neg Hx    Colon cancer Neg Hx    Pancreatic cancer Neg Hx     SOCIAL HISTORY: Social History   Socioeconomic History   Marital status: Married    Spouse name: Not on file   Number of children: Not on file   Years of education: Not on file   Highest education level: Not on file  Occupational History   Not on file  Tobacco Use   Smoking status: Never   Smokeless tobacco: Never  Vaping Use   Vaping status: Never Used  Substance and Sexual Activity   Alcohol use: Yes    Comment: 4 beers per week   Drug use: No   Sexual activity: Yes  Other Topics Concern   Not on file  Social History Narrative   Not on file   Social Drivers of Health   Financial Resource Strain: Not on file  Food Insecurity: No Food Insecurity (07/11/2023)   Hunger Vital Sign    Worried About Running Out of Food in the Last Year: Never true    Ran Out of Food in the Last Year: Never true  Transportation Needs: No Transportation Needs (07/11/2023)   PRAPARE - Administrator, Civil Service (Medical): No    Lack of  Transportation (Non-Medical): No  Physical Activity: Not on file  Stress: Not on file  Social Connections: Moderately Isolated (07/11/2023)   Social Connection and Isolation Panel [NHANES]    Frequency of Communication with Friends and Family: More than three times a week    Frequency of Social Gatherings with Friends and Family: Once a week    Attends Religious Services: Never    Database administrator or Organizations: No    Attends Banker Meetings: Never    Marital Status: Married  Catering manager Violence: Not At Risk (07/11/2023)   Humiliation, Afraid, Rape, and Kick questionnaire    Fear of Current or Ex-Partner: No    Emotionally Abused: No    Physically Abused:  No    Sexually Abused: No    PHYSICAL EXAM  There were no vitals filed for this visit. There is no height or weight on file to calculate BMI.  Generalized: Well developed, in no acute distress  Neurological examination  Mentation: Alert oriented to time, place, history taking. Follows all commands speech and language fluent Cranial nerve II-XII: Pupils were equal round reactive to light. Extraocular movements were full, visual field were full on confrontational test. Facial sensation and strength were normal. Uvula tongue midline. Head turning and shoulder shrug  were normal and symmetric. Motor: The motor testing reveals 5 over 5 strength of all 4 extremities. Good symmetric motor tone is noted throughout.  Sensory: Sensory testing is intact to soft touch on all 4 extremities. No evidence of extinction is noted.  Coordination: Cerebellar testing reveals good finger-nose-finger and heel-to-shin bilaterally.  Gait and station: Gait is normal. Tandem gait is normal. Romberg is negative. No drift is seen.  Reflexes: Deep tendon reflexes are symmetric and normal bilaterally.   DIAGNOSTIC DATA (LABS, IMAGING, TESTING) - I reviewed patient records, labs, notes, testing and imaging myself where available.  Lab  Results  Component Value Date   WBC 2.5 (L) 08/10/2023   HGB 8.9 (L) 08/10/2023   HCT 29.1 (L) 08/10/2023   MCV 81.3 08/10/2023   PLT 212 08/10/2023      Component Value Date/Time   NA 140 08/10/2023 0616   K 4.2 08/10/2023 0616   CL 106 08/10/2023 0616   CO2 27 08/10/2023 0616   GLUCOSE 103 (H) 08/10/2023 0616   BUN 9 08/10/2023 0616   CREATININE 0.67 08/10/2023 0616   CREATININE 0.60 (L) 07/21/2023 1216   CALCIUM 8.1 (L) 08/10/2023 0616   PROT 5.8 (L) 08/08/2023 1507   ALBUMIN 2.8 (L) 08/08/2023 1507   AST 20 08/08/2023 1507   AST 18 07/21/2023 1216   ALT 22 08/08/2023 1507   ALT 24 07/21/2023 1216   ALKPHOS 69 08/08/2023 1507   BILITOT 0.4 08/08/2023 1507   BILITOT 0.3 07/21/2023 1216   GFRNONAA >60 08/10/2023 0616   GFRNONAA >60 07/21/2023 1216   GFRAA >60 07/17/2019 0322   Lab Results  Component Value Date   CHOL 179 08/09/2023   HDL 59 08/09/2023   LDLCALC 98 08/09/2023   TRIG 112 08/09/2023   CHOLHDL 3.0 08/09/2023   Lab Results  Component Value Date   HGBA1C 5.0 08/09/2023   Lab Results  Component Value Date   VITAMINB12 887 07/10/2023   No results found for: "TSH"  Margie Ege, AGNP-C, DNP 09/14/2023, 5:45 AM [facility] [address] [place], [place] [address] [phone]

## 2023-09-20 ENCOUNTER — Other Ambulatory Visit (HOSPITAL_COMMUNITY): Payer: Self-pay

## 2023-09-20 ENCOUNTER — Other Ambulatory Visit (HOSPITAL_BASED_OUTPATIENT_CLINIC_OR_DEPARTMENT_OTHER): Payer: Self-pay

## 2023-09-27 ENCOUNTER — Other Ambulatory Visit (HOSPITAL_COMMUNITY): Payer: Medicare Other

## 2023-10-04 DEATH — deceased

## 2023-10-11 ENCOUNTER — Other Ambulatory Visit (HOSPITAL_COMMUNITY): Payer: Medicare Other
